# Patient Record
Sex: Female | Born: 1945
Health system: Southern US, Community
[De-identification: ages and names within clinical notes are randomized; demographics above are authoritative.]

## PROBLEM LIST (undated history)

## (undated) DIAGNOSIS — C801 Malignant (primary) neoplasm, unspecified: Secondary | ICD-10-CM

## (undated) DIAGNOSIS — R739 Hyperglycemia, unspecified: Secondary | ICD-10-CM

## (undated) DIAGNOSIS — K219 Gastro-esophageal reflux disease without esophagitis: Secondary | ICD-10-CM

## (undated) DIAGNOSIS — J449 Chronic obstructive pulmonary disease, unspecified: Secondary | ICD-10-CM

## (undated) DIAGNOSIS — E785 Hyperlipidemia, unspecified: Secondary | ICD-10-CM

## (undated) DIAGNOSIS — Z923 Personal history of irradiation: Secondary | ICD-10-CM

## (undated) DIAGNOSIS — N6310 Unspecified lump in the right breast, unspecified quadrant: Secondary | ICD-10-CM

## (undated) DIAGNOSIS — M8580 Other specified disorders of bone density and structure, unspecified site: Secondary | ICD-10-CM

## (undated) DIAGNOSIS — J189 Pneumonia, unspecified organism: Secondary | ICD-10-CM

## (undated) DIAGNOSIS — T8859XA Other complications of anesthesia, initial encounter: Secondary | ICD-10-CM

## (undated) DIAGNOSIS — E039 Hypothyroidism, unspecified: Secondary | ICD-10-CM

## (undated) DIAGNOSIS — J45909 Unspecified asthma, uncomplicated: Secondary | ICD-10-CM

## (undated) DIAGNOSIS — G43909 Migraine, unspecified, not intractable, without status migrainosus: Secondary | ICD-10-CM

## (undated) DIAGNOSIS — M1990 Unspecified osteoarthritis, unspecified site: Secondary | ICD-10-CM

## (undated) DIAGNOSIS — E079 Disorder of thyroid, unspecified: Secondary | ICD-10-CM

## (undated) DIAGNOSIS — Z8719 Personal history of other diseases of the digestive system: Secondary | ICD-10-CM

## (undated) DIAGNOSIS — N63 Unspecified lump in unspecified breast: Secondary | ICD-10-CM

## (undated) DIAGNOSIS — T4145XA Adverse effect of unspecified anesthetic, initial encounter: Secondary | ICD-10-CM

## (undated) DIAGNOSIS — I10 Essential (primary) hypertension: Secondary | ICD-10-CM

## (undated) HISTORY — DX: Chronic obstructive pulmonary disease, unspecified: J44.9

## (undated) HISTORY — DX: Essential (primary) hypertension: I10

## (undated) HISTORY — PX: TONSILLECTOMY: SUR1361

## (undated) HISTORY — PX: TUBAL LIGATION: SHX77

## (undated) HISTORY — DX: Disorder of thyroid, unspecified: E07.9

## (undated) HISTORY — DX: Unspecified osteoarthritis, unspecified site: M19.90

## (undated) HISTORY — DX: Other specified disorders of bone density and structure, unspecified site: M85.80

## (undated) HISTORY — DX: Hyperglycemia, unspecified: R73.9

## (undated) HISTORY — PX: DILATION AND CURETTAGE OF UTERUS: SHX78

## (undated) HISTORY — DX: Gastro-esophageal reflux disease without esophagitis: K21.9

## (undated) HISTORY — PX: POLYPECTOMY: SHX149

## (undated) HISTORY — PX: ANKLE SURGERY: SHX546

## (undated) HISTORY — DX: Hyperlipidemia, unspecified: E78.5

---
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
## 1977-03-31 HISTORY — PX: VAGINAL HYSTERECTOMY: SUR661

## 1998-03-31 HISTORY — PX: INGUINAL HERNIA REPAIR: SUR1180

## 2011-01-07 ENCOUNTER — Institutional Professional Consult (permissible substitution): Payer: Self-pay | Admitting: Emergency Medicine

## 2011-01-27 ENCOUNTER — Encounter: Payer: Self-pay | Admitting: Emergency Medicine

## 2011-01-28 ENCOUNTER — Ambulatory Visit (INDEPENDENT_AMBULATORY_CARE_PROVIDER_SITE_OTHER): Payer: Medicare Other | Admitting: Emergency Medicine

## 2011-01-28 ENCOUNTER — Encounter: Payer: Self-pay | Admitting: Emergency Medicine

## 2011-01-28 ENCOUNTER — Ambulatory Visit (INDEPENDENT_AMBULATORY_CARE_PROVIDER_SITE_OTHER)
Admission: RE | Admit: 2011-01-28 | Discharge: 2011-01-28 | Disposition: A | Discharge status: Home / Self Care | Payer: Medicare Other | Source: Ambulatory Visit | Attending: Emergency Medicine | Admitting: Emergency Medicine

## 2011-01-28 VITALS — BP 122/78 | HR 72 | Temp 97.1°F | Ht 64.5 in | Wt 137.8 lb

## 2011-01-28 DIAGNOSIS — J449 Chronic obstructive pulmonary disease, unspecified: Secondary | ICD-10-CM

## 2011-01-28 NOTE — Progress Notes (Signed)
Subjective:    Patient ID: Linda [name], female    DOB: Jun 16, 1945, 65 y.o.   MRN: [id number]  HPI 65 yo former smoker, dx with COPD in 2007.  Referred by Dr Sudie Bailey for evaluation of her COPD. Also w hx HTN, allergies, hyperlipidemia. Currnetly on Spiriva + prn SABA. Tells me that she had spirometry probably 2007 at Dr Donita Brooks office. Has also been rx for exac vs PNA on 2 occasions since '07. Pt has noticed more symptoms over last 8-9 mo, she finds trouble breathing at night and in the am, has been sleeping on a wedge. No real cough or sputum. She is also using albuterol more frequently - was rare, now about bid.  She is also having R chest pain, around to back, also substernal. Occurs at random, the substernal pain is a burning pain. She is able to exert, goes to the gym.     Review of Systems  Constitutional: Negative for fever and unexpected weight change.  HENT: Negative for ear pain, nosebleeds, congestion, sore throat, rhinorrhea, sneezing, trouble swallowing, dental problem, postnasal drip and sinus pressure.   Eyes: Positive for itching. Negative for redness.  Respiratory: Positive for shortness of breath and wheezing. Negative for cough and chest tightness.   Cardiovascular: Negative for palpitations and leg swelling.  Gastrointestinal: Negative for nausea and vomiting.  Genitourinary: Negative for dysuria.  Musculoskeletal: Negative for joint swelling.  Skin: Negative for rash.  Neurological: Negative for headaches.  Hematological: Bruises/bleeds easily.  Psychiatric/Behavioral: Positive for dysphoric mood. The patient is not nervous/anxious.     Past Medical History  Diagnosis Date  . Hypertension   . Thyroid disease   . Arthritis   . GERD (gastroesophageal reflux disease)   . COPD (chronic obstructive pulmonary disease)      Family History  Problem Relation Age of Onset  . Hypertension Father   . Hypertension Mother   . Stroke Mother   . Heart attack Mother      History   Social History  . Marital Status: Legally Separated    Spouse Name: N/A    Number of Children: N/A  . Years of Education: N/A   Occupational History  . Not on file.   Social History Main Topics  . Smoking status: Former Smoker -- 1.5 packs/day for 20 years  . Smokeless tobacco: Never Used  . Alcohol Use: Not on file  . Drug Use: Not on file  . Sexually Active: Not on file   Other Topics Concern  . Not on file   Social History Narrative  . No narrative on file     Allergies  Allergen Reactions  . Aspirin     ulcer     Outpatient Prescriptions Prior to Visit  Medication Sig Dispense Refill  . albuterol (PROAIR HFA) 108 (90 BASE) MCG/ACT inhaler Inhale 2 puffs into the lungs every 6 (six) hours as needed.        Marland Kitchen albuterol (PROVENTIL) (2.5 MG/3ML) 0.083% nebulizer solution Take 2.5 mg by nebulization 4 (four) times daily.        . Biotin 5000 MCG CAPS Take 5,000 capsules by mouth once. Take 1 cap three times a week       . Cholecalciferol (VITAMIN D3) 1000 UNITS CAPS Take 1,000 Units by mouth once.        . Coenzyme Q10 (COQ10) 150 MG CAPS Take by mouth once. 10/150 mg qd       . FA-B6-B12-Omega 3-Biotin-Cr (DIVISTA) 1 MG CAPS  Take 1 mg by mouth 1 day or 1 dose.        . levothyroxine (SYNTHROID, LEVOTHROID) 88 MCG tablet Take 88 mcg by mouth daily.        Marland Kitchen losartan (COZAAR) 100 MG tablet Take 100 mg by mouth daily.        . meloxicam (MOBIC) 15 MG tablet Take 15 mg by mouth daily.        Marland Kitchen omeprazole (PRILOSEC) 20 MG capsule Take 20 mg by mouth daily.        Maxwell Caul Bicarbonate (ZEGERID) 20-1100 MG CAPS Take 1 capsule by mouth.        . Pantethine 300 MG CAPS Take by mouth. As directed        . pravastatin (PRAVACHOL) 20 MG tablet Take 20 mg by mouth daily.        Marland Kitchen tiotropium (SPIRIVA) 18 MCG inhalation capsule Place 18 mcg into inhaler and inhale daily.              Objective:   Physical Exam  Gen: Pleasant, in no distress, pressured  speech  ENT: No lesions,  mouth clear,  oropharynx clear, no postnasal drip  Neck: No JVD, no TMG, no carotid bruits  Lungs: No use of accessory muscles, no dullness to percussion, clear without rales or rhonchi  Cardiovascular: RRR, heart sounds normal, no murmur or gallops, no peripheral edema  Musculoskeletal: No deformities, no cyanosis or clubbing  Neuro: alert, non focal  Skin: Warm, no lesions or rashes     Assessment & Plan:  COPD (chronic obstructive pulmonary disease) - full PFT - CXR today - spiriva qd - walking oximetry today - rov next avail

## 2011-01-28 NOTE — Assessment & Plan Note (Signed)
-   full PFT - CXR today - spiriva qd - walking oximetry today - rov next avail

## 2011-01-28 NOTE — Patient Instructions (Signed)
Continue your Spiriva Use albuterol or ProAir as needed Walking oximetry today Full Pulmonary Function testing CXR today Follow up next available with PFT's

## 2011-02-18 ENCOUNTER — Encounter: Payer: Self-pay | Admitting: Emergency Medicine

## 2011-02-18 ENCOUNTER — Ambulatory Visit (INDEPENDENT_AMBULATORY_CARE_PROVIDER_SITE_OTHER): Payer: Medicare Other | Admitting: Emergency Medicine

## 2011-02-18 VITALS — BP 120/76 | HR 92 | Temp 97.8°F | Ht 64.0 in | Wt 137.0 lb

## 2011-02-18 DIAGNOSIS — J449 Chronic obstructive pulmonary disease, unspecified: Secondary | ICD-10-CM

## 2011-02-18 LAB — PULMONARY FUNCTION TEST

## 2011-02-18 NOTE — Progress Notes (Signed)
  Subjective:    Patient ID: Linda [name], female    DOB: 1945-08-02, 65 y.o.   MRN: [id number] HPI 65 yo former smoker, dx with COPD in 2007.  Referred by Dr Sudie Bailey for evaluation of her COPD. Also w hx HTN, allergies, hyperlipidemia. Currnetly on Spiriva + prn SABA. Tells me that she had spirometry probably 2007 at Dr Donita Brooks office. Has also been rx for exac vs PNA on 2 occasions since '07. Pt has noticed more symptoms over last 8-9 mo, she finds trouble breathing at night and in the am, has been sleeping on a wedge. No real cough or sputum. She is also using albuterol more frequently - was rare, now about bid.  She is also having R chest pain, around to back, also substernal. Occurs at random, the substernal pain is a burning pain. She is able to exert, goes to the gym.    ROV 02/18/11 -- follows up after PFT. Hx COPD dx in 2007. Cleda Daub shows FEV1 1.46 (69% pred).  She may be coming down w a URI. CXR last time showed no abnormalities. She feels well.    PULMONARY FUNCTON TEST 02/18/2011  FVC 3.42  FEV1 1.46  FEV1/FVC 42.7  FVC  % Predicted 117  FEV % Predicted 69  FeF 25-75 0.46  FeF 25-75 % Predicted 2.41      Objective:   Physical Exam  Gen: Pleasant, in no distress, pressured speech  ENT: No lesions,  mouth clear,  oropharynx clear, no postnasal drip  Neck: No JVD, no TMG, no carotid bruits  Lungs: No use of accessory muscles, no dullness to percussion, clear without rales or rhonchi  Cardiovascular: RRR, heart sounds normal, no murmur or gallops, no peripheral edema  Musculoskeletal: No deformities, no cyanosis or clubbing  Neuro: alert, non focal  Skin: Warm, no lesions or rashes     Assessment & Plan:  COPD (chronic obstructive pulmonary disease) PFT show moderate to moderately severe AFL. She is stable on Spiriva, believes it probably helps her.  - same BD's - rov 6 months

## 2011-02-18 NOTE — Progress Notes (Signed)
PFT done today. 

## 2011-02-18 NOTE — Patient Instructions (Signed)
Please continue your Spiriva every day Use your ProAir as needed Follow with Dr Delton Coombes in 6 months or sooner if you have any problems.

## 2011-02-18 NOTE — Assessment & Plan Note (Signed)
PFT show moderate to moderately severe AFL. She is stable on Spiriva, believes it probably helps her.  - same BD's - rov 6 months

## 2011-04-29 ENCOUNTER — Telehealth: Payer: Self-pay | Admitting: Emergency Medicine

## 2011-04-29 NOTE — Telephone Encounter (Signed)
ATC---still fast busy signal.  Emergency contact number disconnected.

## 2011-04-29 NOTE — Telephone Encounter (Signed)
ATC the pt's home number several times- kept getting a fast busy tone Called work number listed and was informed no one by her name is employed there Called the emergency contact number and this number has been d/c'ed Will cont to try the home number and try to get the pt to see TP at [facility] clinic this pm

## 2011-04-29 NOTE — Telephone Encounter (Signed)
ATC- still fast busy tone

## 2011-04-29 NOTE — Telephone Encounter (Signed)
Attempted to call, number still busy.

## 2011-04-29 NOTE — Telephone Encounter (Signed)
ATC- still fast busy tone 

## 2011-04-30 NOTE — Telephone Encounter (Signed)
ATC the home number again, NA and no option to leave a msg. WCB

## 2011-04-30 NOTE — Telephone Encounter (Signed)
ATC again at home number. Line rang several times, NA, no voicemail. WCB. Carron Curie, CMA

## 2011-05-01 ENCOUNTER — Ambulatory Visit: Payer: Medicare Other | Admitting: Emergency Medicine

## 2011-05-01 NOTE — Telephone Encounter (Signed)
LMTCB ? Why did she cancel her appt??

## 2011-05-01 NOTE — Telephone Encounter (Signed)
Pt returned call. Says her power went out and that's why no one could reach her. She says that she feels "better" and doesn't believe she has pna. Doesn't want an appt today or "any time soon" but says she will call if she needs anything further. Did not need a call back. I am closing this msg. Hazel Sams

## 2014-08-22 DIAGNOSIS — J329 Chronic sinusitis, unspecified: Secondary | ICD-10-CM | POA: Diagnosis not present

## 2014-08-22 DIAGNOSIS — J309 Allergic rhinitis, unspecified: Secondary | ICD-10-CM | POA: Diagnosis not present

## 2014-08-22 DIAGNOSIS — E890 Postprocedural hypothyroidism: Secondary | ICD-10-CM | POA: Diagnosis not present

## 2014-08-22 DIAGNOSIS — Z79899 Other long term (current) drug therapy: Secondary | ICD-10-CM | POA: Diagnosis not present

## 2014-08-22 DIAGNOSIS — J449 Chronic obstructive pulmonary disease, unspecified: Secondary | ICD-10-CM | POA: Diagnosis not present

## 2014-08-22 DIAGNOSIS — E785 Hyperlipidemia, unspecified: Secondary | ICD-10-CM | POA: Diagnosis not present

## 2014-08-22 DIAGNOSIS — I10 Essential (primary) hypertension: Secondary | ICD-10-CM | POA: Diagnosis not present

## 2014-08-22 DIAGNOSIS — R51 Headache: Secondary | ICD-10-CM | POA: Diagnosis not present

## 2014-08-22 DIAGNOSIS — R7301 Impaired fasting glucose: Secondary | ICD-10-CM | POA: Diagnosis not present

## 2014-08-22 DIAGNOSIS — Z0001 Encounter for general adult medical examination with abnormal findings: Secondary | ICD-10-CM | POA: Diagnosis not present

## 2014-08-22 DIAGNOSIS — E559 Vitamin D deficiency, unspecified: Secondary | ICD-10-CM | POA: Diagnosis not present

## 2014-08-31 ENCOUNTER — Other Ambulatory Visit: Payer: Self-pay

## 2014-08-31 DIAGNOSIS — Z1231 Encounter for screening mammogram for malignant neoplasm of breast: Secondary | ICD-10-CM

## 2014-10-03 ENCOUNTER — Ambulatory Visit
Admission: RE | Admit: 2014-10-03 | Discharge: 2014-10-03 | Disposition: A | Discharge status: Home / Self Care | Payer: Commercial Managed Care - HMO | Source: Ambulatory Visit

## 2014-10-03 DIAGNOSIS — Z1231 Encounter for screening mammogram for malignant neoplasm of breast: Secondary | ICD-10-CM

## 2014-10-04 ENCOUNTER — Other Ambulatory Visit: Payer: Self-pay | Admitting: Internal Medicine

## 2014-10-04 DIAGNOSIS — R928 Other abnormal and inconclusive findings on diagnostic imaging of breast: Secondary | ICD-10-CM

## 2014-10-19 DIAGNOSIS — E785 Hyperlipidemia, unspecified: Secondary | ICD-10-CM | POA: Diagnosis not present

## 2014-10-19 DIAGNOSIS — H109 Unspecified conjunctivitis: Secondary | ICD-10-CM | POA: Diagnosis not present

## 2014-10-19 DIAGNOSIS — E890 Postprocedural hypothyroidism: Secondary | ICD-10-CM | POA: Diagnosis not present

## 2014-10-19 DIAGNOSIS — I10 Essential (primary) hypertension: Secondary | ICD-10-CM | POA: Diagnosis not present

## 2014-10-19 DIAGNOSIS — R7301 Impaired fasting glucose: Secondary | ICD-10-CM | POA: Diagnosis not present

## 2014-10-19 DIAGNOSIS — J449 Chronic obstructive pulmonary disease, unspecified: Secondary | ICD-10-CM | POA: Diagnosis not present

## 2014-12-21 DIAGNOSIS — E785 Hyperlipidemia, unspecified: Secondary | ICD-10-CM | POA: Diagnosis not present

## 2014-12-21 DIAGNOSIS — I10 Essential (primary) hypertension: Secondary | ICD-10-CM | POA: Diagnosis not present

## 2014-12-21 DIAGNOSIS — J449 Chronic obstructive pulmonary disease, unspecified: Secondary | ICD-10-CM | POA: Diagnosis not present

## 2014-12-21 DIAGNOSIS — E890 Postprocedural hypothyroidism: Secondary | ICD-10-CM | POA: Diagnosis not present

## 2014-12-21 DIAGNOSIS — R7301 Impaired fasting glucose: Secondary | ICD-10-CM | POA: Diagnosis not present

## 2014-12-28 DIAGNOSIS — Z1211 Encounter for screening for malignant neoplasm of colon: Secondary | ICD-10-CM | POA: Diagnosis not present

## 2015-02-06 DIAGNOSIS — I10 Essential (primary) hypertension: Secondary | ICD-10-CM | POA: Diagnosis not present

## 2015-02-06 DIAGNOSIS — R0789 Other chest pain: Secondary | ICD-10-CM | POA: Diagnosis not present

## 2015-02-06 DIAGNOSIS — R0602 Shortness of breath: Secondary | ICD-10-CM | POA: Diagnosis not present

## 2015-02-06 DIAGNOSIS — J441 Chronic obstructive pulmonary disease with (acute) exacerbation: Secondary | ICD-10-CM | POA: Diagnosis not present

## 2015-02-20 DIAGNOSIS — R0602 Shortness of breath: Secondary | ICD-10-CM | POA: Diagnosis not present

## 2015-02-20 DIAGNOSIS — J441 Chronic obstructive pulmonary disease with (acute) exacerbation: Secondary | ICD-10-CM | POA: Diagnosis not present

## 2015-02-20 DIAGNOSIS — J40 Bronchitis, not specified as acute or chronic: Secondary | ICD-10-CM | POA: Diagnosis not present

## 2015-02-20 DIAGNOSIS — R079 Chest pain, unspecified: Secondary | ICD-10-CM | POA: Diagnosis not present

## 2015-02-26 DIAGNOSIS — R079 Chest pain, unspecified: Secondary | ICD-10-CM | POA: Diagnosis not present

## 2015-02-26 DIAGNOSIS — R0602 Shortness of breath: Secondary | ICD-10-CM | POA: Diagnosis not present

## 2015-02-26 DIAGNOSIS — E278 Other specified disorders of adrenal gland: Secondary | ICD-10-CM | POA: Diagnosis not present

## 2015-02-26 DIAGNOSIS — J439 Emphysema, unspecified: Secondary | ICD-10-CM | POA: Diagnosis not present

## 2015-02-26 DIAGNOSIS — J9811 Atelectasis: Secondary | ICD-10-CM | POA: Diagnosis not present

## 2015-03-08 DIAGNOSIS — J449 Chronic obstructive pulmonary disease, unspecified: Secondary | ICD-10-CM | POA: Diagnosis not present

## 2015-03-08 DIAGNOSIS — R079 Chest pain, unspecified: Secondary | ICD-10-CM | POA: Diagnosis not present

## 2015-03-08 DIAGNOSIS — B370 Candidal stomatitis: Secondary | ICD-10-CM | POA: Diagnosis not present

## 2015-04-24 DIAGNOSIS — E890 Postprocedural hypothyroidism: Secondary | ICD-10-CM | POA: Diagnosis not present

## 2015-04-24 DIAGNOSIS — I10 Essential (primary) hypertension: Secondary | ICD-10-CM | POA: Diagnosis not present

## 2015-04-24 DIAGNOSIS — E785 Hyperlipidemia, unspecified: Secondary | ICD-10-CM | POA: Diagnosis not present

## 2015-04-24 DIAGNOSIS — R7301 Impaired fasting glucose: Secondary | ICD-10-CM | POA: Diagnosis not present

## 2015-04-24 DIAGNOSIS — J449 Chronic obstructive pulmonary disease, unspecified: Secondary | ICD-10-CM | POA: Diagnosis not present

## 2015-05-11 DIAGNOSIS — E890 Postprocedural hypothyroidism: Secondary | ICD-10-CM | POA: Diagnosis not present

## 2015-05-11 DIAGNOSIS — R7301 Impaired fasting glucose: Secondary | ICD-10-CM | POA: Diagnosis not present

## 2015-05-11 DIAGNOSIS — K210 Gastro-esophageal reflux disease with esophagitis: Secondary | ICD-10-CM | POA: Diagnosis not present

## 2015-05-11 DIAGNOSIS — Z0001 Encounter for general adult medical examination with abnormal findings: Secondary | ICD-10-CM | POA: Diagnosis not present

## 2015-05-11 DIAGNOSIS — Z79899 Other long term (current) drug therapy: Secondary | ICD-10-CM | POA: Diagnosis not present

## 2015-05-11 DIAGNOSIS — E559 Vitamin D deficiency, unspecified: Secondary | ICD-10-CM | POA: Diagnosis not present

## 2015-05-11 DIAGNOSIS — J441 Chronic obstructive pulmonary disease with (acute) exacerbation: Secondary | ICD-10-CM | POA: Diagnosis not present

## 2015-05-11 DIAGNOSIS — E785 Hyperlipidemia, unspecified: Secondary | ICD-10-CM | POA: Diagnosis not present

## 2015-05-11 DIAGNOSIS — I10 Essential (primary) hypertension: Secondary | ICD-10-CM | POA: Diagnosis not present

## 2015-05-29 ENCOUNTER — Other Ambulatory Visit: Payer: Self-pay | Admitting: Internal Medicine

## 2015-05-29 DIAGNOSIS — K2100 Gastro-esophageal reflux disease with esophagitis, without bleeding: Secondary | ICD-10-CM

## 2015-06-18 DIAGNOSIS — Z85828 Personal history of other malignant neoplasm of skin: Secondary | ICD-10-CM | POA: Diagnosis not present

## 2015-06-18 DIAGNOSIS — C44519 Basal cell carcinoma of skin of other part of trunk: Secondary | ICD-10-CM | POA: Diagnosis not present

## 2015-06-26 ENCOUNTER — Ambulatory Visit
Admission: RE | Admit: 2015-06-26 | Discharge: 2015-06-26 | Disposition: A | Discharge status: Home / Self Care | Payer: Commercial Managed Care - HMO | Source: Ambulatory Visit | Attending: Internal Medicine | Admitting: Internal Medicine

## 2015-06-26 DIAGNOSIS — K2100 Gastro-esophageal reflux disease with esophagitis, without bleeding: Secondary | ICD-10-CM

## 2015-06-26 DIAGNOSIS — K219 Gastro-esophageal reflux disease without esophagitis: Secondary | ICD-10-CM | POA: Diagnosis not present

## 2015-06-26 DIAGNOSIS — K449 Diaphragmatic hernia without obstruction or gangrene: Secondary | ICD-10-CM | POA: Diagnosis not present

## 2015-07-04 ENCOUNTER — Encounter: Payer: Self-pay | Admitting: Pulmonary Disease

## 2015-07-04 ENCOUNTER — Encounter: Payer: Self-pay | Admitting: *Deleted

## 2015-07-05 ENCOUNTER — Encounter: Payer: Self-pay | Admitting: Pulmonary Disease

## 2015-07-05 ENCOUNTER — Ambulatory Visit (INDEPENDENT_AMBULATORY_CARE_PROVIDER_SITE_OTHER): Payer: Commercial Managed Care - HMO | Admitting: Pulmonary Disease

## 2015-07-05 VITALS — BP 156/82 | HR 80 | Ht 64.0 in | Wt 137.0 lb

## 2015-07-05 DIAGNOSIS — J439 Emphysema, unspecified: Secondary | ICD-10-CM | POA: Diagnosis not present

## 2015-07-05 NOTE — Progress Notes (Signed)
Subjective:    Patient ID: Linda [name], female    DOB: [birth year], 70 y.o.   MRN: [id number]  HPI Consult for management of COPD.  Linda [name] is a 70 year old with moderate COPD. She was seen by Dr. Lamonte Sakai in 2012. She has not followed up in our office since then. She has been maintained on Spiriva and Symbicort. His symptoms have been stable until this year when she has several exacerbations over the winter requiring courses of antibiotics and steroids. Her primary care physician has referred her back to [place] for further management.  Her chief complaint is dyspnea on exertion, loss of energy, fatigue,  chronic cough with sputum production. She usually exercises frequently at the gym but has not had the energy to do so in the past 3 months.  Data: PFTs 02/18/51 FVC 2.42 [117%) FEV1 1.46 (69%) F/F 46 TLC 112% DLCO 69% Moderate obstructive defect, mild reduction in diffusion capacity.  Social History: She is to refer-pack-year smoking history. Quit in 2008. She drinks alcohol once or twice a year. No illegal drug use.  Family history: Mother-allergies, coronary artery disease, hypertension, stroke. Father-hypertension  Past Medical History  Diagnosis Date  . Hypertension   . Thyroid disease   . Arthritis   . GERD (gastroesophageal reflux disease)   . COPD (chronic obstructive pulmonary disease) ([place])   . Hyperlipidemia   . Hyperglycemia   . Osteopenia     Current outpatient prescriptions:  .  albuterol (PROAIR HFA) 108 (90 BASE) MCG/ACT inhaler, Inhale 2 puffs into the lungs every 6 (six) hours as needed.  , Disp: , Rfl:  .  albuterol (PROVENTIL) (2.5 MG/3ML) 0.083% nebulizer solution, Take 2.5 mg by nebulization 4 (four) times daily.  , Disp: , Rfl:  .  amLODipine (NORVASC) 5 MG tablet, Take 5 mg by mouth daily., Disp: , Rfl:  .  budesonide-formoterol (SYMBICORT) 160-4.5 MCG/ACT inhaler, Inhale 2 puffs into the lungs 2 (two) times daily., Disp: , Rfl:  .  Cholecalciferol  (VITAMIN D3) 1000 UNITS CAPS, Take 1,000 Units by mouth once.  , Disp: , Rfl:  .  dexlansoprazole (DEXILANT) 60 MG capsule, Take 60 mg by mouth daily., Disp: , Rfl:  .  levothyroxine (SYNTHROID, LEVOTHROID) 88 MCG tablet, Take 88 mcg by mouth daily.  , Disp: , Rfl:  .  losartan (COZAAR) 100 MG tablet, Take 100 mg by mouth daily.  , Disp: , Rfl:  .  Magnesium Chloride (MAGNESIUM DR PO), Take by mouth daily., Disp: , Rfl:  .  meloxicam (MOBIC) 15 MG tablet, Take 15 mg by mouth daily.  , Disp: , Rfl:  .  omeprazole (PRILOSEC) 20 MG capsule, Take 20 mg by mouth daily.  , Disp: , Rfl:  .  Omeprazole-Sodium Bicarbonate (ZEGERID) 20-1100 MG CAPS, Take 1 capsule by mouth.  , Disp: , Rfl:  .  OVER THE COUNTER MEDICATION, Tru Biotics, Enzyme Defense, Natural Calm, Disp: , Rfl:  .  pravastatin (PRAVACHOL) 20 MG tablet, Take 20 mg by mouth daily.  , Disp: , Rfl:  .  tiotropium (SPIRIVA) 18 MCG inhalation capsule, Place 18 mcg into inhaler and inhale daily.  , Disp: , Rfl:   Review of Systems Dyspnea on exertion cough, sputum production, wheezing. No hemoptysis. No fevers, chills, loss of weight, loss of appetite. Positive for malaise, fatigue, loss of energy. No chest pain, palpitation. No nausea, vomiting, diarrhea, constipation. All other review of systems are negative    Objective:   Physical Exam  Blood pressure 156/82, pulse 80, height 5\' 4"  (1.626 m), weight 137 lb (62.143 kg), SpO2 95 % at rest, 92% on exertion. Gen: No apparent distress Neuro: No gross focal deficits. HEENT: No JVD, lymphadenopathy, thyromegaly. RS: Clear, No wheeze or crackles CVS: S1-S2 heard, no murmurs rubs gallops. Abdomen: Soft, positive bowel sounds. Musculoskeletal: No edema.    Assessment & Plan:  Moderate COPD. GOLD stage II She's been maintained previously on Symbicort and Spiriva. However she's noted a decline in function over the past year with frequent exacerbations requiring antibiotic and steroids. I'll  repeat pulmonary function tests for a more recent assessment of lung function. I'll start her on daliresp to reduce the number of exacerbations. I discussed pulmonary rehabilitation with her but she is trying to resume her exercise program with a personal trainer and feels she does not need another program. She did not desaturate on exercise today.  Plan: - PFTs,  - Continue symbicort and spiriva - Start daliresp.  Marshell Garfinkel MD [facility] Pager [phone] If no answer or after 3pm call: [phone] 07/05/2015, 4:04 PM

## 2015-07-05 NOTE — Patient Instructions (Signed)
We will schedule you for lung function tests and start you on daliresp.  Return to clinic in 3 months

## 2015-07-09 ENCOUNTER — Telehealth: Payer: Self-pay | Admitting: Pulmonary Disease

## 2015-07-09 MED ORDER — ROFLUMILAST 500 MCG PO TABS: 500.0000 ug | ORAL_TABLET | Freq: Every day | ORAL | Status: DC

## 2015-07-09 NOTE — Telephone Encounter (Signed)
Spoke with pt. When she was here for her last OV, PM wanted her to start Holiday. This was not sent to the pharmacy. This has been done. Nothing further was needed.

## 2015-08-22 ENCOUNTER — Other Ambulatory Visit: Payer: Self-pay | Admitting: Pulmonary Disease

## 2015-08-22 DIAGNOSIS — J449 Chronic obstructive pulmonary disease, unspecified: Secondary | ICD-10-CM

## 2015-08-23 ENCOUNTER — Ambulatory Visit (INDEPENDENT_AMBULATORY_CARE_PROVIDER_SITE_OTHER): Payer: Commercial Managed Care - HMO | Admitting: Pulmonary Disease

## 2015-08-23 DIAGNOSIS — J449 Chronic obstructive pulmonary disease, unspecified: Secondary | ICD-10-CM

## 2015-08-23 LAB — PULMONARY FUNCTION TEST
DL/VA % PRED: 60 %
DL/VA: 2.89 ml/min/mmHg/L
DLCO COR % PRED: 58 %
DLCO COR: 14.14 ml/min/mmHg
DLCO UNC % PRED: 57 %
DLCO unc: 14 ml/min/mmHg
FEF 25-75 POST: 0.49 L/s
FEF 25-75 PRE: 0.34 L/s
FEF2575-%CHANGE-POST: 44 %
FEF2575-%PRED-PRE: 18 %
FEF2575-%Pred-Post: 26 %
FEV1-%Change-Post: 18 %
FEV1-%PRED-PRE: 41 %
FEV1-%Pred-Post: 49 %
FEV1-POST: 1.11 L
FEV1-Pre: 0.94 L
FEV1FVC-%Change-Post: 3 %
FEV1FVC-%Pred-Pre: 58 %
FEV6-%CHANGE-POST: 16 %
FEV6-%PRED-PRE: 70 %
FEV6-%Pred-Post: 81 %
FEV6-PRE: 2 L
FEV6-Post: 2.32 L
FEV6FVC-%Change-Post: 2 %
FEV6FVC-%Pred-Post: 99 %
FEV6FVC-%Pred-Pre: 97 %
FVC-%Change-Post: 14 %
FVC-%PRED-POST: 82 %
FVC-%PRED-PRE: 71 %
FVC-POST: 2.44 L
FVC-PRE: 2.14 L
POST FEV1/FVC RATIO: 46 %
POST FEV6/FVC RATIO: 95 %
PRE FEV1/FVC RATIO: 44 %
Pre FEV6/FVC Ratio: 93 %
RV % pred: 136 %
RV: 3.01 L
TLC % pred: 114 %
TLC: 5.77 L

## 2015-08-23 NOTE — Progress Notes (Signed)
PFT done today. 

## 2015-09-11 ENCOUNTER — Telehealth: Payer: Self-pay | Admitting: Pulmonary Disease

## 2015-09-11 NOTE — Telephone Encounter (Signed)
Had called patient to discuss PFT results as stated by PM: Notes Recorded by Rinaldo Ratel, CMA on 09/11/2015 at 11:51 AM Called spoke with patient, advised of PFT results / recs as stated by PM.  Pt verbalized her understanding and denied any questions.  Notes Recorded by Marshell Garfinkel, MD on 09/10/2015 at 1:54 PM Please let the patient know that her PFTs shows that she has worsening COPD. I will assess her response to daliresp at next visit. No changes to her med regimen need now.  Pt asking if PM can stage her COPD, please She would also like PM to be aware that she is unable to afford both her Spiriva and Symbicort every month (due to the donut hole) and typically fills the Symbicort because she does not feel like the Spiriva is working for her anymore but the Symbicort does not "last a long time"  Please advise, thank you.

## 2015-09-11 NOTE — Progress Notes (Signed)
Quick Note:  Called spoke with patient, advised of PFT results / recs as stated by PM. Pt verbalized her understanding and denied any questions. ______

## 2015-09-12 NOTE — Telephone Encounter (Signed)
We can try her on Airduo 232/14 mcg, the generic form which is cheaper. She can be given a diagnosis of asthma and COPD, as there is a bronchodilator response on PFTs. Its FDA approved only for asthma.  Use this instead of the symbicort. Give her samples of spiriva if we have any Thanks

## 2015-09-14 NOTE — Telephone Encounter (Signed)
Called spoke with patient and discussed PM's recs.  Pt stated that she would like to stay with the Symbicort for now but will discuss with PM at her upcoming 7.11.17 appt.  We have no samples of the Airduo for her to try, nor any Spiriva HandiHaler samples.  Nothing further needed at this time; will sign off.

## 2015-10-09 ENCOUNTER — Encounter: Payer: Self-pay | Admitting: Pulmonary Disease

## 2015-10-09 ENCOUNTER — Ambulatory Visit (INDEPENDENT_AMBULATORY_CARE_PROVIDER_SITE_OTHER): Payer: Commercial Managed Care - HMO | Admitting: Pulmonary Disease

## 2015-10-09 VITALS — BP 150/92 | HR 85 | Temp 98.2°F | Ht 64.5 in | Wt 131.8 lb

## 2015-10-09 DIAGNOSIS — J449 Chronic obstructive pulmonary disease, unspecified: Secondary | ICD-10-CM

## 2015-10-09 NOTE — Progress Notes (Signed)
Subjective:    Patient ID: Linda [name], female    DOB: [birth year], 70 y.o.   MRN: [id number]  PROBLEM LIST Severe COPD  HPI Linda [name] is a 70 year old with moderate COPD. She was seen by Dr. Lamonte Sakai in 2012. She has not followed up in our office since then. She has been maintained on Spiriva and Symbicort. His symptoms have been stable until this year when she has several exacerbations over the winter requiring courses of antibiotics and steroids. Her primary care physician has referred her back to [place] for further management. Her chief complaint is dyspnea on exertion, loss of energy, fatigue,  chronic cough with sputum production.   Data: PFTs  02/18/11 FVC 2.42 [117%) FEV1 1.46 (69%) F/F 46 TLC 112% DLCO 69% Moderate obstructive defect, mild reduction in diffusion capacity.  08/23/15 FVC 2.14 [71%] FEV1 0.94 [41%] F/F 44 TLC 114% DLCO 57% Severe obstructive defect with moderate reduction in diffusion capacity  Social History: She is to refer-pack-year smoking history. Quit in 2008. She drinks alcohol once or twice a year. No illegal drug use.  Family history: Mother-allergies, coronary artery disease, hypertension, stroke. Father-hypertension  Past Medical History  Diagnosis Date  . Hypertension   . Thyroid disease   . Arthritis   . GERD (gastroesophageal reflux disease)   . COPD (chronic obstructive pulmonary disease) ([place])   . Hyperlipidemia   . Hyperglycemia   . Osteopenia     Current outpatient prescriptions:  .  albuterol (PROAIR HFA) 108 (90 BASE) MCG/ACT inhaler, Inhale 2 puffs into the lungs every 6 (six) hours as needed.  , Disp: , Rfl:  .  albuterol (PROVENTIL) (2.5 MG/3ML) 0.083% nebulizer solution, Take 2.5 mg by nebulization 4 (four) times daily.  , Disp: , Rfl:  .  amLODipine (NORVASC) 5 MG tablet, Take 5 mg by mouth daily., Disp: , Rfl:  .  budesonide-formoterol (SYMBICORT) 160-4.5 MCG/ACT inhaler, Inhale 2 puffs into the lungs 2 (two) times  daily., Disp: , Rfl:  .  Cholecalciferol (VITAMIN D3) 1000 UNITS CAPS, Take 1,000 Units by mouth once.  , Disp: , Rfl:  .  dexlansoprazole (DEXILANT) 60 MG capsule, Take 60 mg by mouth daily., Disp: , Rfl:  .  levothyroxine (SYNTHROID, LEVOTHROID) 88 MCG tablet, Take 88 mcg by mouth daily.  , Disp: , Rfl:  .  losartan (COZAAR) 100 MG tablet, Take 100 mg by mouth daily.  , Disp: , Rfl:  .  Magnesium Chloride (MAGNESIUM DR PO), Take by mouth daily., Disp: , Rfl:  .  meloxicam (MOBIC) 15 MG tablet, Take 15 mg by mouth daily.  , Disp: , Rfl:  .  omeprazole (PRILOSEC) 20 MG capsule, Take 20 mg by mouth daily.  , Disp: , Rfl:  .  Omeprazole-Sodium Bicarbonate (ZEGERID) 20-1100 MG CAPS, Take 1 capsule by mouth.  , Disp: , Rfl:  .  OVER THE COUNTER MEDICATION, Tru Biotics, Enzyme Defense, Natural Calm, Disp: , Rfl:  .  pravastatin (PRAVACHOL) 20 MG tablet, Take 20 mg by mouth daily.  , Disp: , Rfl:  .  roflumilast (DALIRESP) 500 MCG TABS tablet, Take 1 tablet (500 mcg total) by mouth daily., Disp: 30 tablet, Rfl: 5 .  tiotropium (SPIRIVA) 18 MCG inhalation capsule, Place 18 mcg into inhaler and inhale daily.  , Disp: , Rfl:   Review of Systems Dyspnea on exertion cough, sputum production, wheezing. No hemoptysis. No fevers, chills, loss of weight, loss of appetite. Positive for malaise, fatigue, loss of energy.  No chest pain, palpitation. No nausea, vomiting, diarrhea, constipation. All other review of systems are negative    Objective:   Physical Exam Blood pressure 150/92, pulse 85, temperature 98.2 F (36.8 C), temperature source Oral, height 5' 4.5" (1.638 m), weight 131 lb 12.8 oz (59.784 kg), SpO2 97 %. Gen: No apparent distress Neuro: No gross focal deficits. HEENT: No JVD, lymphadenopathy, thyromegaly. RS: Clear, No wheeze or crackles CVS: S1-S2 heard, no murmurs rubs gallops. Abdomen: Soft, positive bowel sounds. Musculoskeletal: No edema.    Assessment & Plan:  Severe COPD. GOLD  stage III She is maintained on symbicort and spiriva. She was started on daliresp at last visit and she is doing well on it with no exacerbations. I discussed pulmonary rehabilitation with her but she is already on a exercise program with a personal trainer 3 times/week and feels she does not need another program.  I reviewed the PFTs with her today.  Plan: - Continue daliresp, symbicort and spiriva  Marshell Garfinkel MD [facility] Pager [phone] If no answer or after 3pm call: [phone] 10/09/2015, 1:50 PM

## 2015-10-09 NOTE — Patient Instructions (Addendum)
Continue daliresp, symbicort and spiriva. We will see if there are samples of the inhalers to give you.  Return on 6 months

## 2015-12-30 ENCOUNTER — Other Ambulatory Visit: Payer: Self-pay | Admitting: Pulmonary Disease

## 2016-01-24 ENCOUNTER — Encounter: Payer: Self-pay | Admitting: Pulmonary Disease

## 2016-01-24 ENCOUNTER — Ambulatory Visit (INDEPENDENT_AMBULATORY_CARE_PROVIDER_SITE_OTHER): Payer: Commercial Managed Care - HMO | Admitting: Pulmonary Disease

## 2016-01-24 VITALS — BP 130/82 | HR 85 | Ht 64.0 in | Wt 127.8 lb

## 2016-01-24 DIAGNOSIS — J449 Chronic obstructive pulmonary disease, unspecified: Secondary | ICD-10-CM | POA: Diagnosis not present

## 2016-01-24 MED ORDER — BUDESONIDE-FORMOTEROL FUMARATE 160-4.5 MCG/ACT IN AERO: 2.0000 | INHALATION_SPRAY | Freq: Two times a day (BID) | RESPIRATORY_TRACT | 0 refills | Status: DC

## 2016-01-24 NOTE — Progress Notes (Signed)
Linda [name]    [id number]    September 18, 1945  Primary Care Physician:PROCHNAU,CAROLINE, MD  Referring Physician: Ernestene Kiel, MD [address]. [place], [place] [address]  Chief complaint:   Follow up for  Severe COPD  HPI: Linda [name] is a 70 year old with moderate COPD. She was seen by Dr. Lamonte Sakai in 2012. She has not followed up in our office since then. She has been maintained on Spiriva and Symbicort. His symptoms have been stable until this year when she has several exacerbations over the winter requiring courses of antibiotics and steroids. Her primary care physician has referred her back to [place] for further management. Her chief complaint is dyspnea on exertion, loss of energy, fatigue,  chronic cough with sputum production.   Interim History: For a few days last week she wasn't feeling well with exertion, increased cough, dyspnea. She feels this is brought on by stress and overworked. She was able to rest over the weekend and feels much better now. She has dyspnea on exertion at baseline. She denies any cough, sputum production, fevers, chills  Outpatient Encounter Prescriptions as of 01/24/2016  Medication Sig  . albuterol (PROAIR HFA) 108 (90 BASE) MCG/ACT inhaler Inhale 2 puffs into the lungs every 6 (six) hours as needed.    Marland Kitchen albuterol (PROVENTIL) (2.5 MG/3ML) 0.083% nebulizer solution Take 2.5 mg by nebulization 4 (four) times daily.    Marland Kitchen amLODipine (NORVASC) 5 MG tablet Take 5 mg by mouth daily.  . budesonide-formoterol (SYMBICORT) 160-4.5 MCG/ACT inhaler Inhale 2 puffs into the lungs 2 (two) times daily.  Marland Kitchen DALIRESP 500 MCG TABS tablet TAKE ONE TABLET BY MOUTH ONCE DAILY  . levothyroxine (SYNTHROID, LEVOTHROID) 88 MCG tablet Take 88 mcg by mouth daily.    Marland Kitchen losartan (COZAAR) 100 MG tablet Take 100 mg by mouth daily.    . Magnesium Chloride (MAGNESIUM DR PO) Take by mouth daily.  . meloxicam (MOBIC) 15 MG tablet Take 15 mg by mouth daily.    Marland Kitchen omeprazole  (PRILOSEC) 20 MG capsule Take 20 mg by mouth daily.    Earney Navy Bicarbonate (ZEGERID) 20-1100 MG CAPS Take 1 capsule by mouth.    Marland Kitchen OVER THE COUNTER MEDICATION Tru Biotics, Enzyme Defense, Natural Calm  . pravastatin (PRAVACHOL) 20 MG tablet Take 20 mg by mouth daily.    Marland Kitchen tiotropium (SPIRIVA) 18 MCG inhalation capsule Place 18 mcg into inhaler and inhale daily.    . Cholecalciferol (VITAMIN D3) 1000 UNITS CAPS Take 1,000 Units by mouth once.    Marland Kitchen dexlansoprazole (DEXILANT) 60 MG capsule Take 60 mg by mouth daily.   No facility-administered encounter medications on file as of 01/24/2016.     Allergies as of 01/24/2016 - Review Complete 01/24/2016  Allergen Reaction Noted  . Aspirin  01/27/2011    Past Medical History:  Diagnosis Date  . Arthritis   . COPD (chronic obstructive pulmonary disease) ([place])   . GERD (gastroesophageal reflux disease)   . Hyperglycemia   . Hyperlipidemia   . Hypertension   . Osteopenia   . Thyroid disease     Past Surgical History:  Procedure Laterality Date  . APPENDECTOMY  1963  . HERNIA REPAIR  2000  . right ankle surgery Right    X 2   . THYROIDECTOMY    . TOTAL ABDOMINAL HYSTERECTOMY  1979    Family History  Problem Relation Age of Onset  . Hypertension Father   . Hypertension Mother   .  Stroke Mother   . Heart attack Mother   . Allergies Mother     Social History   Social History  . Marital status: Legally Separated    Spouse name: N/A  . Number of children: 3  . Years of education: N/A   Occupational History  . retired    Social History Main Topics  . Smoking status: Former Smoker    Packs/day: 1.00    Years: 25.00    Types: Cigarettes    Quit date: 03/31/2006  . Smokeless tobacco: Never Used  . Alcohol use 0.0 oz/week     Comment: rare  . Drug use: No  . Sexual activity: Not on file   Other Topics Concern  . Not on file   Social History Narrative  . No narrative on file    Review of systems: Review  of Systems  Constitutional: Negative for fever and chills.  HENT: Negative.   Eyes: Negative for blurred vision.  Respiratory: as per HPI  Cardiovascular: Negative for chest pain and palpitations.  Gastrointestinal: Negative for vomiting, diarrhea, blood per rectum. Genitourinary: Negative for dysuria, urgency, frequency and hematuria.  Musculoskeletal: Negative for myalgias, back pain and joint pain.  Skin: Negative for itching and rash.  Neurological: Negative for dizziness, tremors, focal weakness, seizures and loss of consciousness.  Endo/Heme/Allergies: Negative for environmental allergies.  Psychiatric/Behavioral: Negative for depression, suicidal ideas and hallucinations.  All other systems reviewed and are negative.   Physical Exam: Blood pressure 130/82, pulse 85, height 5\' 4"  (1.626 m), weight 127 lb 12.8 oz (58 kg), SpO2 98 %. Gen:      No acute distress HEENT:  EOMI, sclera anicteric Neck:     No masses; no thyromegaly Lungs:    Clear to auscultation bilaterally; normal respiratory effort CV:         Regular rate and rhythm; no murmurs Abd:      + bowel sounds; soft, non-tender; no palpable masses, no distension Ext:    No edema; adequate peripheral perfusion Skin:      Warm and dry; no rash Neuro: alert and oriented x 3 Psych: normal mood and affect  Data Reviewed: PFTs  02/18/11 FVC 2.42 [117%) FEV1 1.46 (69%) F/F 46 TLC 112% DLCO 69% Moderate obstructive defect, mild reduction in diffusion capacity.  08/23/15 FVC 2.14 [71%] FEV1 0.94 [41%] F/F 44 TLC 114% DLCO 57% Severe obstructive defect with moderate reduction in diffusion capacity  Assessment:  Severe COPD. GOLD stage III She is maintained on symbicort and spiriva. She feels that the Daliresp has helped a lot with his symptoms. She does have some symptoms of insomnia and palpitations after starting the Daliresp. We discussed stopping it but she wants to continue on it since it is helping her. I  discussed pulmonary rehabilitation with her but she is already on a exercise program with a personal trainer 3 times/week and feels she does not need another program.   Plan/Recommendations: - Continue daliresp, symbicort and spiriva  Marshell Garfinkel MD [facility] Pager [phone] 01/24/2016, 1:54 PM  CC: Ernestene Kiel, MD

## 2016-01-24 NOTE — Patient Instructions (Signed)
Continue using inhalers and Daliresp.  Return to clinic in 3 months.

## 2016-03-31 DIAGNOSIS — Z923 Personal history of irradiation: Secondary | ICD-10-CM

## 2016-03-31 HISTORY — DX: Personal history of irradiation: Z92.3

## 2016-04-14 DIAGNOSIS — N6310 Unspecified lump in the right breast, unspecified quadrant: Secondary | ICD-10-CM | POA: Diagnosis not present

## 2016-04-14 DIAGNOSIS — I10 Essential (primary) hypertension: Secondary | ICD-10-CM | POA: Diagnosis not present

## 2016-04-14 DIAGNOSIS — E890 Postprocedural hypothyroidism: Secondary | ICD-10-CM | POA: Diagnosis not present

## 2016-04-14 DIAGNOSIS — R7301 Impaired fasting glucose: Secondary | ICD-10-CM | POA: Diagnosis not present

## 2016-04-14 DIAGNOSIS — J449 Chronic obstructive pulmonary disease, unspecified: Secondary | ICD-10-CM | POA: Diagnosis not present

## 2016-04-14 DIAGNOSIS — Z79899 Other long term (current) drug therapy: Secondary | ICD-10-CM | POA: Diagnosis not present

## 2016-04-14 DIAGNOSIS — E785 Hyperlipidemia, unspecified: Secondary | ICD-10-CM | POA: Diagnosis not present

## 2016-04-14 DIAGNOSIS — E559 Vitamin D deficiency, unspecified: Secondary | ICD-10-CM | POA: Diagnosis not present

## 2016-04-29 ENCOUNTER — Encounter: Payer: Self-pay | Admitting: Pulmonary Disease

## 2016-04-29 ENCOUNTER — Ambulatory Visit (INDEPENDENT_AMBULATORY_CARE_PROVIDER_SITE_OTHER): Payer: PPO | Admitting: Pulmonary Disease

## 2016-04-29 VITALS — BP 132/70 | HR 89 | Ht 64.0 in

## 2016-04-29 DIAGNOSIS — J449 Chronic obstructive pulmonary disease, unspecified: Secondary | ICD-10-CM

## 2016-04-29 NOTE — Progress Notes (Signed)
TOMOE ENGEL    [id number]    June 14, 1945  Primary Care Physician:PROCHNAU,CAROLINE, MD  Referring Physician: Ernestene Kiel, MD [address]. [place], [place] [address]  Chief complaint:   Follow up for  COPD GOLD D  HPI: Mrs. Spallone is a 71 year old with COPD (GOLD B, CAT score 25, multiple exacerbation over past year, FEV1 41%). She was seen by Dr. Lamonte Sakai in 2012. She has not followed up in our office since then. She has been maintained on Spiriva and Symbicort. His symptoms have been stable until this year when she has several exacerbations over the winter requiring courses of antibiotics and steroids. Her primary care physician has referred her back to [place] for further management. Her chief complaint is dyspnea on exertion, loss of energy, fatigue,  chronic cough with sputum production.   Interim History: She has dyspnea on exertion at baseline. She continues to use the Symbicort and Spiriva. She is also on Daliresp. She feels that the co-pay is high but this is helping with her symptoms.  Outpatient Encounter Prescriptions as of 04/29/2016  Medication Sig  . albuterol (PROAIR HFA) 108 (90 BASE) MCG/ACT inhaler Inhale 2 puffs into the lungs every 6 (six) hours as needed.    Marland Kitchen albuterol (PROVENTIL) (2.5 MG/3ML) 0.083% nebulizer solution Take 2.5 mg by nebulization 4 (four) times daily.    Marland Kitchen amLODipine (NORVASC) 5 MG tablet Take 5 mg by mouth daily.  . budesonide-formoterol (SYMBICORT) 160-4.5 MCG/ACT inhaler Inhale 2 puffs into the lungs 2 (two) times daily.  . Cholecalciferol (VITAMIN D3) 1000 UNITS CAPS Take 1,000 Units by mouth once.    Marland Kitchen DALIRESP 500 MCG TABS tablet TAKE ONE TABLET BY MOUTH ONCE DAILY  . levothyroxine (SYNTHROID, LEVOTHROID) 88 MCG tablet Take 88 mcg by mouth daily.    Marland Kitchen losartan (COZAAR) 100 MG tablet Take 100 mg by mouth daily.    . Magnesium Chloride (MAGNESIUM DR PO) Take by mouth daily.  . meloxicam (MOBIC) 15 MG tablet Take 15 mg by mouth daily.      Marland Kitchen omeprazole (PRILOSEC) 20 MG capsule Take 20 mg by mouth daily.    Earney Navy Bicarbonate (ZEGERID) 20-1100 MG CAPS Take 1 capsule by mouth.    Marland Kitchen OVER THE COUNTER MEDICATION Tru Biotics, Enzyme Defense, Natural Calm  . pravastatin (PRAVACHOL) 20 MG tablet Take 20 mg by mouth daily.    Marland Kitchen tiotropium (SPIRIVA) 18 MCG inhalation capsule Place 18 mcg into inhaler and inhale daily.    . budesonide-formoterol (SYMBICORT) 160-4.5 MCG/ACT inhaler Inhale 2 puffs into the lungs 2 (two) times daily.  . budesonide-formoterol (SYMBICORT) 160-4.5 MCG/ACT inhaler Inhale 2 puffs into the lungs 2 (two) times daily.  . [DISCONTINUED] dexlansoprazole (DEXILANT) 60 MG capsule Take 60 mg by mouth daily.   No facility-administered encounter medications on file as of 04/29/2016.     Allergies as of 04/29/2016 - Review Complete 04/29/2016  Allergen Reaction Noted  . Aspirin  01/27/2011    Past Medical History:  Diagnosis Date  . Arthritis   . COPD (chronic obstructive pulmonary disease) ([place])   . GERD (gastroesophageal reflux disease)   . Hyperglycemia   . Hyperlipidemia   . Hypertension   . Osteopenia   . Thyroid disease     Past Surgical History:  Procedure Laterality Date  . APPENDECTOMY  1963  . HERNIA REPAIR  2000  . right ankle surgery Right    X 2   . THYROIDECTOMY    .  TOTAL ABDOMINAL HYSTERECTOMY  1979    Family History  Problem Relation Age of Onset  . Hypertension Father   . Hypertension Mother   . Stroke Mother   . Heart attack Mother   . Allergies Mother     Social History   Social History  . Marital status: Legally Separated    Spouse name: N/A  . Number of children: 3  . Years of education: N/A   Occupational History  . retired    Social History Main Topics  . Smoking status: Former Smoker    Packs/day: 1.00    Years: 25.00    Types: Cigarettes    Quit date: 03/31/2006  . Smokeless tobacco: Never Used  . Alcohol use 0.0 oz/week     Comment: rare  .  Drug use: No  . Sexual activity: Not on file   Other Topics Concern  . Not on file   Social History Narrative  . No narrative on file    Review of systems: Review of Systems  Constitutional: Negative for fever and chills.  HENT: Negative.   Eyes: Negative for blurred vision.  Respiratory: as per HPI  Cardiovascular: Negative for chest pain and palpitations.  Gastrointestinal: Negative for vomiting, diarrhea, blood per rectum. Genitourinary: Negative for dysuria, urgency, frequency and hematuria.  Musculoskeletal: Negative for myalgias, back pain and joint pain.  Skin: Negative for itching and rash.  Neurological: Negative for dizziness, tremors, focal weakness, seizures and loss of consciousness.  Endo/Heme/Allergies: Negative for environmental allergies.  Psychiatric/Behavioral: Negative for depression, suicidal ideas and hallucinations.  All other systems reviewed and are negative.   Physical Exam: Blood pressure 132/70, pulse 89, height 5\' 4"  (1.626 m), SpO2 95 %. Gen:      No acute distress HEENT:  EOMI, sclera anicteric Neck:     No masses; no thyromegaly Lungs:    Clear to auscultation bilaterally; Diminished breath sounds, normal respiratory effort CV:         Regular rate and rhythm; no murmurs Abd:      + bowel sounds; soft, non-tender; no palpable masses, no distension Ext:    No edema; adequate peripheral perfusion Skin:      Warm and dry; no rash Neuro: alert and oriented x 3 Psych: normal mood and affect  Data Reviewed: PFTs  02/18/11 FVC 2.42 [117%) FEV1 1.46 (69%) F/F 46 TLC 112% DLCO 69% Moderate obstructive defect, mild reduction in diffusion capacity.  08/23/15 FVC 2.14 [71%] FEV1 0.94 [41%] F/F 44 TLC 114% DLCO 57% Severe obstructive defect with moderate reduction in diffusion capacity  CT chest 02/26/15- mild left lower lobe atelectasis, moderate emphysematous changes. Images reviewed.  Assessment:  Severe COPD. GOLD stage D She is  maintained on Symbicort and Spiriva. She feels that that daliresp is helping with her symptoms. Given the copay high she wants to continue using the [place] as it has helped with her symptoms. She has not had any exacerbations since starting this medication.  I discussed pulmonary rehabilitation but she is on already on an exercise program with a personal trainer. She is a candidate for low-dose screening CT given his smoking history but she is not interested in it now given the associated costs.  Plan/Recommendations: - Continue daliresp, Symbicort, Clearance Coots MD  [facility] Pager [phone] 04/29/2016, 1:54 PM  CC: Ernestene Kiel, MD

## 2016-04-29 NOTE — Patient Instructions (Signed)
Continue using your inhalers and Daliresp  Return to clinic in 6 months

## 2016-05-07 ENCOUNTER — Other Ambulatory Visit: Payer: Self-pay | Admitting: Internal Medicine

## 2016-05-07 DIAGNOSIS — N6310 Unspecified lump in the right breast, unspecified quadrant: Secondary | ICD-10-CM

## 2016-05-08 ENCOUNTER — Inpatient Hospital Stay (HOSPITAL_COMMUNITY)
Admission: EM | Admit: 2016-05-08 | Discharge: 2016-05-12 | DRG: 470 | Disposition: A | Discharge status: Home Health | Payer: PPO | Attending: Internal Medicine | Admitting: Internal Medicine

## 2016-05-08 ENCOUNTER — Emergency Department (HOSPITAL_COMMUNITY): Payer: PPO

## 2016-05-08 ENCOUNTER — Encounter (HOSPITAL_COMMUNITY): Payer: Self-pay

## 2016-05-08 ENCOUNTER — Telehealth (INDEPENDENT_AMBULATORY_CARE_PROVIDER_SITE_OTHER): Payer: Self-pay | Admitting: Orthopaedic Surgery

## 2016-05-08 DIAGNOSIS — T148XXA Other injury of unspecified body region, initial encounter: Secondary | ICD-10-CM | POA: Diagnosis not present

## 2016-05-08 DIAGNOSIS — E039 Hypothyroidism, unspecified: Secondary | ICD-10-CM | POA: Diagnosis not present

## 2016-05-08 DIAGNOSIS — Y9239 Other specified sports and athletic area as the place of occurrence of the external cause: Secondary | ICD-10-CM

## 2016-05-08 DIAGNOSIS — Z923 Personal history of irradiation: Secondary | ICD-10-CM | POA: Diagnosis not present

## 2016-05-08 DIAGNOSIS — Z791 Long term (current) use of non-steroidal anti-inflammatories (NSAID): Secondary | ICD-10-CM | POA: Diagnosis not present

## 2016-05-08 DIAGNOSIS — J449 Chronic obstructive pulmonary disease, unspecified: Secondary | ICD-10-CM | POA: Diagnosis not present

## 2016-05-08 DIAGNOSIS — N63 Unspecified lump in unspecified breast: Secondary | ICD-10-CM | POA: Diagnosis not present

## 2016-05-08 DIAGNOSIS — S299XXA Unspecified injury of thorax, initial encounter: Secondary | ICD-10-CM | POA: Diagnosis not present

## 2016-05-08 DIAGNOSIS — Z823 Family history of stroke: Secondary | ICD-10-CM

## 2016-05-08 DIAGNOSIS — R269 Unspecified abnormalities of gait and mobility: Secondary | ICD-10-CM | POA: Diagnosis not present

## 2016-05-08 DIAGNOSIS — Z96642 Presence of left artificial hip joint: Secondary | ICD-10-CM

## 2016-05-08 DIAGNOSIS — K219 Gastro-esophageal reflux disease without esophagitis: Secondary | ICD-10-CM | POA: Diagnosis present

## 2016-05-08 DIAGNOSIS — I10 Essential (primary) hypertension: Secondary | ICD-10-CM | POA: Diagnosis not present

## 2016-05-08 DIAGNOSIS — S7292XA Unspecified fracture of left femur, initial encounter for closed fracture: Secondary | ICD-10-CM | POA: Diagnosis present

## 2016-05-08 DIAGNOSIS — Z419 Encounter for procedure for purposes other than remedying health state, unspecified: Secondary | ICD-10-CM

## 2016-05-08 DIAGNOSIS — R110 Nausea: Secondary | ICD-10-CM | POA: Diagnosis not present

## 2016-05-08 DIAGNOSIS — S72002A Fracture of unspecified part of neck of left femur, initial encounter for closed fracture: Secondary | ICD-10-CM | POA: Diagnosis not present

## 2016-05-08 DIAGNOSIS — Z90710 Acquired absence of both cervix and uterus: Secondary | ICD-10-CM

## 2016-05-08 DIAGNOSIS — E890 Postprocedural hypothyroidism: Secondary | ICD-10-CM | POA: Diagnosis present

## 2016-05-08 DIAGNOSIS — S72042A Displaced fracture of base of neck of left femur, initial encounter for closed fracture: Secondary | ICD-10-CM | POA: Diagnosis not present

## 2016-05-08 DIAGNOSIS — W19XXXA Unspecified fall, initial encounter: Secondary | ICD-10-CM

## 2016-05-08 DIAGNOSIS — Z7951 Long term (current) use of inhaled steroids: Secondary | ICD-10-CM

## 2016-05-08 DIAGNOSIS — Z8249 Family history of ischemic heart disease and other diseases of the circulatory system: Secondary | ICD-10-CM

## 2016-05-08 DIAGNOSIS — E860 Dehydration: Secondary | ICD-10-CM | POA: Diagnosis present

## 2016-05-08 DIAGNOSIS — Z87891 Personal history of nicotine dependence: Secondary | ICD-10-CM

## 2016-05-08 DIAGNOSIS — Z79899 Other long term (current) drug therapy: Secondary | ICD-10-CM

## 2016-05-08 DIAGNOSIS — E871 Hypo-osmolality and hyponatremia: Secondary | ICD-10-CM

## 2016-05-08 DIAGNOSIS — J9811 Atelectasis: Secondary | ICD-10-CM | POA: Diagnosis not present

## 2016-05-08 DIAGNOSIS — Z471 Aftercare following joint replacement surgery: Secondary | ICD-10-CM | POA: Diagnosis not present

## 2016-05-08 DIAGNOSIS — M8580 Other specified disorders of bone density and structure, unspecified site: Secondary | ICD-10-CM | POA: Diagnosis present

## 2016-05-08 DIAGNOSIS — Z886 Allergy status to analgesic agent status: Secondary | ICD-10-CM | POA: Diagnosis not present

## 2016-05-08 DIAGNOSIS — D72829 Elevated white blood cell count, unspecified: Secondary | ICD-10-CM | POA: Diagnosis present

## 2016-05-08 DIAGNOSIS — E785 Hyperlipidemia, unspecified: Secondary | ICD-10-CM | POA: Diagnosis present

## 2016-05-08 DIAGNOSIS — W010XXA Fall on same level from slipping, tripping and stumbling without subsequent striking against object, initial encounter: Secondary | ICD-10-CM | POA: Diagnosis present

## 2016-05-08 DIAGNOSIS — M25552 Pain in left hip: Secondary | ICD-10-CM | POA: Diagnosis not present

## 2016-05-08 HISTORY — DX: Personal history of irradiation: Z92.3

## 2016-05-08 HISTORY — DX: Migraine, unspecified, not intractable, without status migrainosus: G43.909

## 2016-05-08 HISTORY — DX: Adverse effect of unspecified anesthetic, initial encounter: T41.45XA

## 2016-05-08 HISTORY — DX: Unspecified lump in the right breast, unspecified quadrant: N63.10

## 2016-05-08 HISTORY — DX: Unspecified asthma, uncomplicated: J45.909

## 2016-05-08 HISTORY — DX: Hypothyroidism, unspecified: E03.9

## 2016-05-08 HISTORY — DX: Other complications of anesthesia, initial encounter: T88.59XA

## 2016-05-08 HISTORY — DX: Personal history of other diseases of the digestive system: Z87.19

## 2016-05-08 HISTORY — DX: Pneumonia, unspecified organism: J18.9

## 2016-05-08 LAB — LIPASE, BLOOD: Lipase: 29 U/L (ref 11–51)

## 2016-05-08 LAB — PROTIME-INR
INR: 1.03
Prothrombin Time: 13.5 seconds (ref 11.4–15.2)

## 2016-05-08 LAB — COMPREHENSIVE METABOLIC PANEL
ALT: 20 U/L (ref 14–54)
AST: 21 U/L (ref 15–41)
Albumin: 4 g/dL (ref 3.5–5.0)
Alkaline Phosphatase: 65 U/L (ref 38–126)
Anion gap: 10 (ref 5–15)
BUN: 11 mg/dL (ref 6–20)
CHLORIDE: 99 mmol/L — AB (ref 101–111)
CO2: 24 mmol/L (ref 22–32)
CREATININE: 0.94 mg/dL (ref 0.44–1.00)
Calcium: 9.1 mg/dL (ref 8.9–10.3)
GFR calc non Af Amer: 60 mL/min — ABNORMAL LOW (ref >60)
Glucose, Bld: 129 mg/dL — ABNORMAL HIGH (ref 65–99)
POTASSIUM: 3.6 mmol/L (ref 3.5–5.1)
SODIUM: 133 mmol/L — AB (ref 135–145)
Total Bilirubin: 0.6 mg/dL (ref 0.3–1.2)
Total Protein: 6.6 g/dL (ref 6.5–8.1)

## 2016-05-08 LAB — CBC WITH DIFFERENTIAL/PLATELET
Basophils Absolute: 0 10*3/uL (ref 0.0–0.1)
Basophils Relative: 0 %
EOS PCT: 0 %
Eosinophils Absolute: 0 10*3/uL (ref 0.0–0.7)
HEMATOCRIT: 42.4 % (ref 36.0–46.0)
HEMOGLOBIN: 14.1 g/dL (ref 12.0–15.0)
LYMPHS ABS: 1 10*3/uL (ref 0.7–4.0)
LYMPHS PCT: 6 %
MCH: 30.4 pg (ref 26.0–34.0)
MCHC: 33.3 g/dL (ref 30.0–36.0)
MCV: 91.4 fL (ref 78.0–100.0)
Monocytes Absolute: 0.7 10*3/uL (ref 0.1–1.0)
Monocytes Relative: 4 %
NEUTROS ABS: 14.9 10*3/uL — AB (ref 1.7–7.7)
Neutrophils Relative %: 90 %
PLATELETS: 315 10*3/uL (ref 150–400)
RBC: 4.64 MIL/uL (ref 3.87–5.11)
RDW: 13.1 % (ref 11.5–15.5)
WBC: 16.6 10*3/uL — AB (ref 4.0–10.5)

## 2016-05-08 MED ORDER — MOMETASONE FURO-FORMOTEROL FUM 200-5 MCG/ACT IN AERO
2.0000 | INHALATION_SPRAY | Freq: Two times a day (BID) | RESPIRATORY_TRACT | Status: DC
  Administered 2016-05-11: 2 via RESPIRATORY_TRACT
  Filled 2016-05-08 (×2): qty 8.8

## 2016-05-08 MED ORDER — ENSURE ENLIVE PO LIQD
237.0000 mL | Freq: Two times a day (BID) | ORAL | Status: DC
  Administered 2016-05-10 – 2016-05-11 (×2): 237 mL via ORAL

## 2016-05-08 MED ORDER — SODIUM CHLORIDE 0.9 % IV SOLN
INTRAVENOUS | Status: DC
  Administered 2016-05-08 – 2016-05-10 (×2): via INTRAVENOUS

## 2016-05-08 MED ORDER — MORPHINE SULFATE (PF) 2 MG/ML IV SOLN: 2.0000 mg | INTRAVENOUS | Status: DC | PRN

## 2016-05-08 MED ORDER — TIOTROPIUM BROMIDE MONOHYDRATE 18 MCG IN CAPS
18.0000 ug | ORAL_CAPSULE | Freq: Every day | RESPIRATORY_TRACT | Status: DC
  Filled 2016-05-08 (×2): qty 5

## 2016-05-08 MED ORDER — LEVOTHYROXINE SODIUM 88 MCG PO TABS
88.0000 ug | ORAL_TABLET | Freq: Every day | ORAL | Status: DC
  Administered 2016-05-09 – 2016-05-12 (×4): 88 ug via ORAL
  Filled 2016-05-08 (×4): qty 1

## 2016-05-08 MED ORDER — ONDANSETRON HCL 4 MG/2ML IJ SOLN
4.0000 mg | Freq: Once | INTRAMUSCULAR | Status: AC
  Administered 2016-05-08: 4 mg via INTRAVENOUS
  Filled 2016-05-08: qty 2

## 2016-05-08 MED ORDER — ROFLUMILAST 500 MCG PO TABS
500.0000 ug | ORAL_TABLET | Freq: Every day | ORAL | Status: DC
  Administered 2016-05-09 – 2016-05-12 (×4): 500 ug via ORAL
  Filled 2016-05-08 (×4): qty 1

## 2016-05-08 MED ORDER — PRAVASTATIN SODIUM 20 MG PO TABS
20.0000 mg | ORAL_TABLET | Freq: Every day | ORAL | Status: DC
  Administered 2016-05-09 – 2016-05-12 (×4): 20 mg via ORAL
  Filled 2016-05-08 (×4): qty 1

## 2016-05-08 MED ORDER — MORPHINE SULFATE (PF) 4 MG/ML IV SOLN
4.0000 mg | INTRAVENOUS | Status: DC | PRN
  Administered 2016-05-08 – 2016-05-09 (×7): 4 mg via INTRAVENOUS
  Filled 2016-05-08 (×7): qty 1

## 2016-05-08 MED ORDER — FENTANYL CITRATE (PF) 100 MCG/2ML IJ SOLN
50.0000 ug | Freq: Once | INTRAMUSCULAR | Status: AC
  Administered 2016-05-08: 50 ug via INTRAVENOUS
  Filled 2016-05-08: qty 2

## 2016-05-08 MED ORDER — ONDANSETRON HCL 4 MG/2ML IJ SOLN
4.0000 mg | Freq: Four times a day (QID) | INTRAMUSCULAR | Status: DC | PRN
  Administered 2016-05-08 – 2016-05-09 (×5): 4 mg via INTRAVENOUS
  Filled 2016-05-08 (×4): qty 2

## 2016-05-08 MED ORDER — ALBUTEROL SULFATE (2.5 MG/3ML) 0.083% IN NEBU
2.5000 mg | INHALATION_SOLUTION | Freq: Four times a day (QID) | RESPIRATORY_TRACT | Status: DC
  Administered 2016-05-08: 2.5 mg via RESPIRATORY_TRACT
  Filled 2016-05-08 (×2): qty 3

## 2016-05-08 MED ORDER — AMLODIPINE BESYLATE 5 MG PO TABS
5.0000 mg | ORAL_TABLET | Freq: Every day | ORAL | Status: DC
  Administered 2016-05-09 – 2016-05-12 (×4): 5 mg via ORAL
  Filled 2016-05-08 (×4): qty 1

## 2016-05-08 MED ORDER — MORPHINE SULFATE (PF) 4 MG/ML IV SOLN
4.0000 mg | Freq: Once | INTRAVENOUS | Status: AC
  Administered 2016-05-08: 4 mg via INTRAVENOUS
  Filled 2016-05-08: qty 1

## 2016-05-08 NOTE — ED Provider Notes (Signed)
Emergency Department Provider Note   I have reviewed the triage vital signs and the nursing notes.   HISTORY  Chief Complaint Hip Injury   HPI Linda [name] is a 71 y.o. female with PMH of COPD, GERD, HLD, HTN, and osteopenia [place] to the emergency department for evaluation of left hip pain after mechanical fall. The patient states she is at the gym and trying to get down from a piece of exercise equipment when she lost her footing and fell. She had sudden onset severe left hip pain made worse with movement. Her pain severity is severe with no radiation. She describes a sharp. She received fentanyl with EMS which improved her symptoms slightly. No prior history of hip fracture. Denies head trauma, neck pain, chest pain. Patient does not take blood thinners. She notes initially she had some mild numbness in the left leg but this has resolved.    Past Medical History:  Diagnosis Date  . Arthritis    "qwhere; hands, feet, different body parts from time to time" (05/08/2016)  . Asthma   . Complication of anesthesia    "they used an adult tube going down my throat; caused alot of problems; was told to always have pediatric tube placed" (05/08/2016)  . COPD (chronic obstructive pulmonary disease) ([place])   . GERD (gastroesophageal reflux disease)   . History of hiatal hernia   . Hyperglycemia   . Hyperlipidemia   . Hypertension   . Hypothyroidism   . Lump of breast, right    "suppose to have it checked soon" (05/08/2016)  . Migraine    "none for years" (05/08/2016)  . Osteopenia   . Pneumonia "several times"  . Status post radioactive iodine thyroid ablation   . Thyroid disease     Patient Active Problem List   Diagnosis Date Noted  . Hypothyroidism 05/09/2016  . Closed left femoral fracture ([place]) 05/08/2016  . COPD, group D, by GOLD 2017 classification ([place]) 01/28/2011    Past Surgical History:  Procedure Laterality Date  . ANKLE SURGERY Right X 2   fibrocystic tumors come  up in my joints  . APPENDECTOMY  1963  . DILATION AND CURETTAGE OF UTERUS  "several"   "when I was young"  . INGUINAL HERNIA REPAIR Bilateral 2000  . POLYPECTOMY     "vocal cord"  . TONSILLECTOMY    . TUBAL LIGATION    . VAGINAL HYSTERECTOMY  1979      Allergies Aspirin  Family History  Problem Relation Age of Onset  . Hypertension Father   . Hypertension Mother   . Stroke Mother   . Heart attack Mother   . Allergies Mother     Social History Social History  Substance Use Topics  . Smoking status: Former Smoker    Packs/day: 1.00    Years: 25.00    Types: Cigarettes    Quit date: 03/31/2006  . Smokeless tobacco: Never Used  . Alcohol use 0.0 oz/week     Comment: 05/08/2016 "I'll have a drink on special occasions; usually wine"    Review of Systems  Constitutional: No fever/chills Eyes: No visual changes. ENT: No sore throat. Cardiovascular: Denies chest pain. Respiratory: Denies shortness of breath. Gastrointestinal: No abdominal pain.  No nausea, no vomiting.  No diarrhea.  No constipation. Genitourinary: Negative for dysuria. Musculoskeletal: Negative for back pain. Positive left hip pain with movement.  Skin: Negative for rash. Neurological: Negative for headaches, focal weakness or numbness.  10-point ROS otherwise negative.  ____________________________________________   PHYSICAL EXAM:  VITAL SIGNS: ED Triage Vitals  Enc Vitals Group     BP 05/08/16 1451 181/92     Pulse Rate 05/08/16 1451 91     Resp 05/08/16 1451 16     Temp 05/08/16 1451 97.5 F (36.4 C)     SpO2 05/08/16 1451 91 %     Pain Score 05/08/16 1441 10   Constitutional: Alert and oriented. Well appearing and in no acute distress. Eyes: Conjunctivae are normal.  Head: Atraumatic. Nose: No congestion/rhinnorhea. Mouth/Throat: Mucous membranes are moist.  Neck: No stridor. No cervical spine tenderness to palpation. Cardiovascular: Normal rate, regular rhythm. Good peripheral  circulation. Grossly normal heart sounds.   Respiratory: Normal respiratory effort.  No retractions. Lungs CTAB. Gastrointestinal: Soft and nontender. No distention.  Musculoskeletal: LLE shortening with severe pain with movement. DP/PT pulses intact. Normal sensation.   Neurologic:  Normal speech and language. No gross focal neurologic deficits are appreciated.  Skin:  Skin is warm, dry and intact. No rash noted. Psychiatric: Mood and affect are normal. Speech and behavior are normal.  ____________________________________________   LABS (all labs ordered are listed, but only abnormal results are displayed)  Labs Reviewed  COMPREHENSIVE METABOLIC PANEL - Abnormal; Notable for the following:       Result Value   Sodium 133 (*)    Chloride 99 (*)    Glucose, Bld 129 (*)    GFR calc non Af Amer 60 (*)    All other components within normal limits  CBC WITH DIFFERENTIAL/PLATELET - Abnormal; Notable for the following:    WBC 16.6 (*)    Neutro Abs 14.9 (*)    All other components within normal limits  CBC - Abnormal; Notable for the following:    WBC 12.0 (*)    All other components within normal limits  COMPREHENSIVE METABOLIC PANEL - Abnormal; Notable for the following:    Chloride 98 (*)    Glucose, Bld 117 (*)    Creatinine, Ser 1.40 (*)    Total Protein 6.0 (*)    GFR calc non Af Amer 37 (*)    GFR calc Af Amer 43 (*)    All other components within normal limits  SURGICAL PCR SCREEN  URINE CULTURE  LIPASE, BLOOD  PROTIME-INR  PROTIME-INR  URINALYSIS, ROUTINE W REFLEX MICROSCOPIC   ____________________________________________  EKG   EKG Interpretation  Date/Time:  Thursday May 08 2016 16:50:11 EST Ventricular Rate:  75 PR Interval:    QRS Duration: 89 QT Interval:  395 QTC Calculation: 442 R Axis:   71 Text Interpretation:  Sinus rhythm Biatrial enlargement Minimal ST depression, inferior leads No STEMI.  Confirmed by Clytie Shetley MD, [facility] Pettry [phone]) on 05/08/2016  5:18:04 PM       ____________________________________________  RADIOLOGY  Dg Chest 1 View  Result Date: 05/08/2016 CLINICAL DATA:  71 year old female tripped and fell on exercise machine. Proximal left femur fracture. Initial encounter. EXAM: CHEST 1 VIEW COMPARISON:  Chest radiographs 06/01/2013.  Chest CTA 02/26/2015. FINDINGS: AP supine view of the chest 1553 hours. Stable cardiac size at the upper limits of normal. Other mediastinal contours are within normal limits. Visualized tracheal air column is within normal limits. No pneumothorax, pulmonary edema, pleural effusion or confluent pulmonary opacity. Large lung volumes, centrilobular emphysema demonstrated in 2016. IMPRESSION: Emphysema.  No acute cardiopulmonary abnormality. Electronically Signed   By: Genevie Ann M.D.   On: 05/08/2016 16:11   Dg Hip Unilat With Pelvis 2-3 Views  Left  Result Date: 05/08/2016 CLINICAL DATA:  71 year old female tripped and fell on exercise machine. Initial encounter. EXAM: DG HIP (WITH OR WITHOUT PELVIS) 2-3V LEFT COMPARISON:  None. FINDINGS: There is a comminuted fracture of the left femoral neck with varus impaction. The left proximal femur intertrochanteric segment appears to remain intact. The left femoral head remains normally located at the left acetabulum. The right femoral head is normally located. The pelvis appears intact. SI joints within normal limits. Negative visible bowel gas pattern. IMPRESSION: Acute left femoral neck fracture appears comminuted with varus impaction. The intertrochanteric segment appears intact, with no other acute fracture or dislocation identified about the pelvis. Electronically Signed   By: Genevie Ann M.D.   On: 05/08/2016 16:10    ____________________________________________   PROCEDURES  Procedure(s) performed:   Procedures  None ____________________________________________   INITIAL IMPRESSION / ASSESSMENT AND PLAN / ED COURSE  Pertinent labs & imaging results  that were available during my care of the patient were reviewed by me and considered in my medical decision making (see chart for details).  Patient presents to the emergency department for evaluation of severe left hip pain after mechanical fall. The left lower extremity is extremely tender to any kind of ROM of the left hip. The leg does appear shortened and slightly rotated externally. Suspect underlying hip fracture with lower suspicion for dislocation. We will obtain x-ray and relief. Patient with no evidence of head or neck trauma. She is not anticoagulated.   05:10 PM Spoke with Dr. Erlinda Hong with Ortho. Plan for OR tomorrow afternoon. Will discuss admission with the hospitalist.   Discussed patient's case with hospitalist. They will place orders for admission. Patient and family (if present) updated with plan. Care transferred to hospitalist service.  I reviewed all nursing notes, vitals, pertinent old records, EKGs, labs, imaging (as available).  ____________________________________________  FINAL CLINICAL IMPRESSION(S) / ED DIAGNOSES  Final diagnoses:  Fall  Closed fracture of left hip, initial encounter ([place])     MEDICATIONS GIVEN DURING THIS VISIT:  Medications  morphine 4 MG/ML injection 4 mg (4 mg Intravenous Given 05/09/16 1020)  mometasone-formoterol (DULERA) 200-5 MCG/ACT inhaler 2 puff (2 puffs Inhalation Not Given 05/09/16 0800)  roflumilast (DALIRESP) tablet 500 mcg (500 mcg Oral Given 05/09/16 1018)  levothyroxine (SYNTHROID, LEVOTHROID) tablet 88 mcg (88 mcg Oral Given 05/09/16 1018)  pravastatin (PRAVACHOL) tablet 20 mg (20 mg Oral Given 05/09/16 1018)  tiotropium (SPIRIVA) inhalation capsule 18 mcg (18 mcg Inhalation Not Given 05/09/16 1000)  ondansetron (ZOFRAN) injection 4 mg (4 mg Intravenous Given 05/09/16 0524)  amLODipine (NORVASC) tablet 5 mg (5 mg Oral Given 05/09/16 1018)  0.9 %  sodium chloride infusion ( Intravenous New Bag/Given 05/08/16 2057)  feeding supplement (ENSURE  ENLIVE) (ENSURE ENLIVE) liquid 237 mL (not administered)  methocarbamol (ROBAXIN) 500 mg in dextrose 5 % 50 mL IVPB (not administered)  HYDROcodone-acetaminophen (NORCO/VICODIN) 5-325 MG per tablet 1-2 tablet (not administered)  albuterol (PROVENTIL) (2.5 MG/3ML) 0.083% nebulizer solution 2.5 mg (not administered)  ondansetron (ZOFRAN) injection 4 mg (4 mg Intravenous Given 05/08/16 1448)  fentaNYL (SUBLIMAZE) injection 50 mcg (50 mcg Intravenous Given 05/08/16 1449)  morphine 4 MG/ML injection 4 mg (4 mg Intravenous Given 05/08/16 1530)     NEW OUTPATIENT MEDICATIONS STARTED DURING THIS VISIT:  None   Note:  This document was prepared using Dragon voice recognition software and may include unintentional dictation errors.  Nanda Quinton, MD Emergency Medicine   Margette Fast, MD 05/09/16 [phone]

## 2016-05-08 NOTE — H&P (Signed)
History and Physical  Linda [name] DOB: 05-15-1945 DOA: 05/08/2016  Referring physician: EDP PCP: Ernestene Kiel, MD   Chief Complaint: mechanical fall , left hip pain  HPI: Linda [name] is a 71 y.o. female h/o HTN, HLD, hypothyroidism, gerd, osteopenia, Prior smoker, h/o copd has been stable, no recent flare, not on home o2, not steroids dependent, per patient she has been having an active life and goes to the gym regularly. Today, she tripped and fell at [facility], report severe left hip pain, and left leg shortening, she is brought to the ED by EMS, hip x ray confirmed "Acute left femoral neck fracture appears comminuted with varus impaction. The intertrochanteric segment appears intact, with no other acute fracture or dislocation identified about the pelvis." , orthopedic Dr Frankey Shown consulted, plan to have surgery on 2/9, hospitalist called to admit the patient.  Patient report the fall is accidental, no recent illness, no fever, no chest pain, no dizziness, no sob, no n/v, no diarrhea, no dysuria, no recent change of medications. Basic labs obtained in the ED with elevated wbc at 16.6, sodium 133, cr, lft wnl. cxr with copd changes, no acute findings, ekg sinus rhythm, no acute st/t changes. Vital stable.   Review of Systems:  Detail per HPI, Review of systems are otherwise negative  Past Medical History:  Diagnosis Date  . Arthritis   . COPD (chronic obstructive pulmonary disease) ([place])   . GERD (gastroesophageal reflux disease)   . Hyperglycemia   . Hyperlipidemia   . Hypertension   . Osteopenia   . Thyroid disease    Past Surgical History:  Procedure Laterality Date  . APPENDECTOMY  1963  . HERNIA REPAIR  2000  . right ankle surgery Right    X 2   . THYROIDECTOMY    . TOTAL ABDOMINAL HYSTERECTOMY  1979   Social History:  reports that she quit smoking about 10 years ago. Her smoking use included Cigarettes. She has a 25.00 pack-year smoking  history. She has never used smokeless tobacco. She reports that she drinks alcohol. She reports that she does not use drugs. Patient lives at home & is able to participate in activities of daily living independently   Allergies  Allergen Reactions  . Aspirin     ulcer    Family History  Problem Relation Age of Onset  . Hypertension Father   . Hypertension Mother   . Stroke Mother   . Heart attack Mother   . Allergies Mother       Prior to Admission medications   Medication Sig Start Date End Date Taking? Authorizing Provider  albuterol (PROAIR HFA) 108 (90 BASE) MCG/ACT inhaler Inhale 2 puffs into the lungs every 6 (six) hours as needed.     Yes Historical Provider, MD  albuterol (PROVENTIL) (2.5 MG/3ML) 0.083% nebulizer solution Take 2.5 mg by nebulization 4 (four) times daily.     Yes Historical Provider, MD  amLODipine (NORVASC) 5 MG tablet Take 5 mg by mouth daily.   Yes Historical Provider, MD  budesonide-formoterol (SYMBICORT) 160-4.5 MCG/ACT inhaler Inhale 2 puffs into the lungs 2 (two) times daily.   Yes Historical Provider, MD  budesonide-formoterol (SYMBICORT) 160-4.5 MCG/ACT inhaler Inhale 2 puffs into the lungs 2 (two) times daily. 01/24/16 05/08/16 Yes Praveen Mannam, MD  Cholecalciferol (VITAMIN D3) 1000 UNITS CAPS Take 1,000 Units by mouth once.     Yes Historical Provider, MD  DALIRESP 500 MCG TABS tablet TAKE ONE TABLET BY MOUTH  ONCE DAILY 12/31/15  Yes Praveen Mannam, MD  levothyroxine (SYNTHROID, LEVOTHROID) 88 MCG tablet Take 88 mcg by mouth daily.     Yes Historical Provider, MD  losartan (COZAAR) 100 MG tablet Take 100 mg by mouth daily.     Yes Historical Provider, MD  Magnesium Chloride (MAGNESIUM DR PO) Take by mouth daily.   Yes Historical Provider, MD  meloxicam (MOBIC) 15 MG tablet Take 15 mg by mouth daily.     Yes Historical Provider, MD  omeprazole (PRILOSEC) 20 MG capsule Take 20 mg by mouth daily.     Yes Historical Provider, MD  Omeprazole-Sodium  Bicarbonate (ZEGERID) 20-1100 MG CAPS Take 1 capsule by mouth.     Yes Historical Provider, MD  OVER THE COUNTER MEDICATION Tru Biotics, Enzyme Defense, Natural Calm   Yes Historical Provider, MD  pravastatin (PRAVACHOL) 20 MG tablet Take 20 mg by mouth daily.     Yes Historical Provider, MD  tiotropium (SPIRIVA) 18 MCG inhalation capsule Place 18 mcg into inhaler and inhale daily.     Yes Historical Provider, MD  budesonide-formoterol (SYMBICORT) 160-4.5 MCG/ACT inhaler Inhale 2 puffs into the lungs 2 (two) times daily. 01/24/16 01/25/16  Marshell Garfinkel, MD    Physical Exam: BP 158/81   Pulse 78   Temp 97.5 F (36.4 C)   Resp 16   SpO2 93%   General:  Does not look comfortable Eyes: PERRL ENT: unremarkable Neck: supple, no JVD Cardiovascular: RRR Respiratory: CTABL Abdomen: soft/ND/ND, positive bowel sounds Skin: no rash Musculoskeletal:  Left leg shortened and externally rotated, left leg/hip significant pain with slight movement Psychiatric: calm/cooperative Neurologic: no focal findings            Labs on Admission:  Basic Metabolic Panel: No results for input(s): NA, K, CL, CO2, GLUCOSE, BUN, CREATININE, CALCIUM, MG, PHOS in the last 168 hours. Liver Function Tests: No results for input(s): AST, ALT, ALKPHOS, BILITOT, PROT, ALBUMIN in the last 168 hours. No results for input(s): LIPASE, AMYLASE in the last 168 hours. No results for input(s): AMMONIA in the last 168 hours. CBC:  Recent Labs Lab 05/08/16 1650  WBC 16.6*  NEUTROABS 14.9*  HGB 14.1  HCT 42.4  MCV 91.4  PLT 315   Cardiac Enzymes: No results for input(s): CKTOTAL, CKMB, CKMBINDEX, TROPONINI in the last 168 hours.  BNP (last 3 results) No results for input(s): BNP in the last 8760 hours.  ProBNP (last 3 results) No results for input(s): PROBNP in the last 8760 hours.  CBG: No results for input(s): GLUCAP in the last 168 hours.  Radiological Exams on Admission: Dg Chest 1 View  Result Date:  05/08/2016 CLINICAL DATA:  71 year old female tripped and fell on exercise machine. Proximal left femur fracture. Initial encounter. EXAM: CHEST 1 VIEW COMPARISON:  Chest radiographs 06/01/2013.  Chest CTA 02/26/2015. FINDINGS: AP supine view of the chest 1553 hours. Stable cardiac size at the upper limits of normal. Other mediastinal contours are within normal limits. Visualized tracheal air column is within normal limits. No pneumothorax, pulmonary edema, pleural effusion or confluent pulmonary opacity. Large lung volumes, centrilobular emphysema demonstrated in 2016. IMPRESSION: Emphysema.  No acute cardiopulmonary abnormality. Electronically Signed   By: Genevie Ann M.D.   On: 05/08/2016 16:11   Dg Hip Unilat With Pelvis 2-3 Views Left  Result Date: 05/08/2016 CLINICAL DATA:  71 year old female tripped and fell on exercise machine. Initial encounter. EXAM: DG HIP (WITH OR WITHOUT PELVIS) 2-3V LEFT COMPARISON:  None. FINDINGS: There  is a comminuted fracture of the left femoral neck with varus impaction. The left proximal femur intertrochanteric segment appears to remain intact. The left femoral head remains normally located at the left acetabulum. The right femoral head is normally located. The pelvis appears intact. SI joints within normal limits. Negative visible bowel gas pattern. IMPRESSION: Acute left femoral neck fracture appears comminuted with varus impaction. The intertrochanteric segment appears intact, with no other acute fracture or dislocation identified about the pelvis. Electronically Signed   By: Genevie Ann M.D.   On: 05/08/2016 16:10      Assessment/Plan Present on Admission: **None**    Left hip fracture from mechanical fall: Patient at baseline is active, no recent illness, she does has copd but no recent flares, stable on home meds, not oxygen or steroids dependent, patient is low to moderate risk for intermediate risk procedures. Prn pain meds, npo after midnight,  Ortho consulted,  plan to have surgery on 2/9  Leukocytosis: likely from stress, no sign of infection , no fever, cxr no infiltrate, will check ua, gentle hydration  Mild hyponatremia: from dehydration, she also has significant amount of pain, gentle hydration, pain control.  Copd: stable, continue home meds.  HTN; continue norvasc, prn hydralazine, hold cozaar perioperatively, resume post op  HLD, continue statin  Hypothyroidism: continue shythroid  DVT prophylaxis: to be determined by ortho post op  Consultants: orthopedics  Code Status: full   Family Communication:  Patient and daughter at bedside  Disposition Plan: admit to med surg  Time spent: 32mins  Keaton Beichner MD, PhD [facility] Pager [phone] If 7PM-7AM, please contact night-coverage at [email or web address], password [facility]

## 2016-05-08 NOTE — Telephone Encounter (Signed)
Dr. Erlinda Hong please call Dr. Laverta Baltimore at [facility] at [phone] please.  Pt has L hip fracture   Pt is in room A3

## 2016-05-08 NOTE — Telephone Encounter (Signed)
See message below °

## 2016-05-08 NOTE — Consult Note (Signed)
I will see patient in the morning with a full consult note.  The plan is for a hip replacement Friday afternoon pending medical optimization.    Azucena Cecil, MD [facility] [phone] 5:15 PM

## 2016-05-08 NOTE — ED Triage Notes (Signed)
Per EMS - pt tripped and fell at [facility]. Pt reports severe left hip pain. Shortening to left leg. Given 157mcg fentanyl with some relief of pain. Denies neck/back pain. No other injuries noted. Hx COPD.

## 2016-05-09 ENCOUNTER — Inpatient Hospital Stay (HOSPITAL_COMMUNITY): Payer: PPO | Admitting: Anesthesiology

## 2016-05-09 ENCOUNTER — Inpatient Hospital Stay (HOSPITAL_COMMUNITY): Payer: PPO

## 2016-05-09 ENCOUNTER — Encounter (HOSPITAL_COMMUNITY)
Admission: EM | Disposition: A | Discharge status: Home Health | Payer: Self-pay | Source: Home / Self Care | Attending: Internal Medicine

## 2016-05-09 DIAGNOSIS — E890 Postprocedural hypothyroidism: Secondary | ICD-10-CM | POA: Diagnosis present

## 2016-05-09 DIAGNOSIS — J449 Chronic obstructive pulmonary disease, unspecified: Secondary | ICD-10-CM

## 2016-05-09 DIAGNOSIS — E039 Hypothyroidism, unspecified: Secondary | ICD-10-CM

## 2016-05-09 HISTORY — PX: TOTAL HIP ARTHROPLASTY: SHX124

## 2016-05-09 HISTORY — DX: Postprocedural hypothyroidism: E89.0

## 2016-05-09 LAB — URINALYSIS, ROUTINE W REFLEX MICROSCOPIC
BILIRUBIN URINE: NEGATIVE
GLUCOSE, UA: NEGATIVE mg/dL
HGB URINE DIPSTICK: NEGATIVE
KETONES UR: NEGATIVE mg/dL
Leukocytes, UA: NEGATIVE
Nitrite: NEGATIVE
PROTEIN: NEGATIVE mg/dL
Specific Gravity, Urine: 1.009 (ref 1.005–1.030)
pH: 8 (ref 5.0–8.0)

## 2016-05-09 LAB — PROTIME-INR
INR: 1.05
PROTHROMBIN TIME: 13.8 s (ref 11.4–15.2)

## 2016-05-09 LAB — COMPREHENSIVE METABOLIC PANEL
ALT: 18 U/L (ref 14–54)
AST: 23 U/L (ref 15–41)
Albumin: 3.7 g/dL (ref 3.5–5.0)
Alkaline Phosphatase: 63 U/L (ref 38–126)
Anion gap: 10 (ref 5–15)
BUN: 17 mg/dL (ref 6–20)
CALCIUM: 9.3 mg/dL (ref 8.9–10.3)
CHLORIDE: 98 mmol/L — AB (ref 101–111)
CO2: 28 mmol/L (ref 22–32)
CREATININE: 1.4 mg/dL — AB (ref 0.44–1.00)
GFR, EST AFRICAN AMERICAN: 43 mL/min — AB (ref >60)
GFR, EST NON AFRICAN AMERICAN: 37 mL/min — AB (ref >60)
Glucose, Bld: 117 mg/dL — ABNORMAL HIGH (ref 65–99)
Potassium: 3.9 mmol/L (ref 3.5–5.1)
Sodium: 136 mmol/L (ref 135–145)
Total Bilirubin: 0.7 mg/dL (ref 0.3–1.2)
Total Protein: 6 g/dL — ABNORMAL LOW (ref 6.5–8.1)

## 2016-05-09 LAB — SURGICAL PCR SCREEN
MRSA, PCR: NEGATIVE
Staphylococcus aureus: NEGATIVE

## 2016-05-09 LAB — CBC
HCT: 41.9 % (ref 36.0–46.0)
Hemoglobin: 13.8 g/dL (ref 12.0–15.0)
MCH: 30.3 pg (ref 26.0–34.0)
MCHC: 32.9 g/dL (ref 30.0–36.0)
MCV: 92.1 fL (ref 78.0–100.0)
PLATELETS: 323 10*3/uL (ref 150–400)
RBC: 4.55 MIL/uL (ref 3.87–5.11)
RDW: 13.3 % (ref 11.5–15.5)
WBC: 12 10*3/uL — ABNORMAL HIGH (ref 4.0–10.5)

## 2016-05-09 SURGERY — ARTHROPLASTY, HIP, TOTAL, ANTERIOR APPROACH
Anesthesia: Spinal | Laterality: Left

## 2016-05-09 SURGERY — ARTHROPLASTY, HIP, TOTAL, ANTERIOR APPROACH
Anesthesia: General | Laterality: Left

## 2016-05-09 MED ORDER — SCOPOLAMINE 1 MG/3DAYS TD PT72
1.0000 | MEDICATED_PATCH | TRANSDERMAL | Status: DC
  Administered 2016-05-09: 1.5 mg via TRANSDERMAL
  Filled 2016-05-09 (×2): qty 1

## 2016-05-09 MED ORDER — CEFAZOLIN SODIUM-DEXTROSE 2-4 GM/100ML-% IV SOLN
2.0000 g | INTRAVENOUS | Status: AC
  Administered 2016-05-09: 2 g via INTRAVENOUS
  Filled 2016-05-09 (×2): qty 100

## 2016-05-09 MED ORDER — ALBUTEROL SULFATE (2.5 MG/3ML) 0.083% IN NEBU: 2.5000 mg | INHALATION_SOLUTION | RESPIRATORY_TRACT | Status: DC | PRN

## 2016-05-09 MED ORDER — METOCLOPRAMIDE HCL 5 MG/ML IJ SOLN: 5.0000 mg | Freq: Three times a day (TID) | INTRAMUSCULAR | Status: DC | PRN

## 2016-05-09 MED ORDER — SODIUM CHLORIDE 0.9 % IR SOLN
Status: DC | PRN
  Administered 2016-05-09: 1000 mL

## 2016-05-09 MED ORDER — PHENYLEPHRINE HCL 10 MG/ML IJ SOLN
INTRAVENOUS | Status: DC | PRN
  Administered 2016-05-09: 20 ug/min via INTRAVENOUS

## 2016-05-09 MED ORDER — POLYETHYLENE GLYCOL 3350 17 G PO PACK: 17.0000 g | PACK | Freq: Two times a day (BID) | ORAL | 0 refills | Status: DC

## 2016-05-09 MED ORDER — BUPIVACAINE IN DEXTROSE 0.75-8.25 % IT SOLN
INTRATHECAL | Status: DC | PRN
  Administered 2016-05-09: 2 mL via INTRATHECAL

## 2016-05-09 MED ORDER — PROPOFOL 10 MG/ML IV BOLUS
INTRAVENOUS | Status: DC | PRN
  Administered 2016-05-09 (×2): 20 mg via INTRAVENOUS

## 2016-05-09 MED ORDER — CHLORHEXIDINE GLUCONATE CLOTH 2 % EX PADS: 6.0000 | MEDICATED_PAD | Freq: Once | CUTANEOUS | Status: DC

## 2016-05-09 MED ORDER — PHENOL 1.4 % MT LIQD: 1.0000 | OROMUCOSAL | Status: DC | PRN

## 2016-05-09 MED ORDER — FENTANYL CITRATE (PF) 100 MCG/2ML IJ SOLN
INTRAMUSCULAR | Status: DC | PRN
  Administered 2016-05-09 (×2): 50 ug via INTRAVENOUS

## 2016-05-09 MED ORDER — LACTATED RINGERS IV SOLN
INTRAVENOUS | Status: DC | PRN
  Administered 2016-05-09 (×2): via INTRAVENOUS

## 2016-05-09 MED ORDER — HYDRALAZINE HCL 20 MG/ML IJ SOLN: 10.0000 mg | Freq: Four times a day (QID) | INTRAMUSCULAR | Status: DC | PRN

## 2016-05-09 MED ORDER — MENTHOL 3 MG MT LOZG: 1.0000 | LOZENGE | OROMUCOSAL | Status: DC | PRN

## 2016-05-09 MED ORDER — FERROUS SULFATE 325 (65 FE) MG PO TABS
325.0000 mg | ORAL_TABLET | Freq: Three times a day (TID) | ORAL | Status: DC
  Administered 2016-05-11: 325 mg via ORAL
  Filled 2016-05-09 (×4): qty 1

## 2016-05-09 MED ORDER — CHLORHEXIDINE GLUCONATE 4 % EX LIQD: 60.0000 mL | Freq: Once | CUTANEOUS | Status: DC

## 2016-05-09 MED ORDER — HYDROCODONE-ACETAMINOPHEN 7.5-325 MG PO TABS: 1.0000 | ORAL_TABLET | ORAL | 0 refills | Status: DC | PRN

## 2016-05-09 MED ORDER — PHENYLEPHRINE HCL 10 MG/ML IJ SOLN
INTRAMUSCULAR | Status: DC | PRN
  Administered 2016-05-09: 80 ug via INTRAVENOUS
  Administered 2016-05-09 (×2): 40 ug via INTRAVENOUS
  Administered 2016-05-09: 80 ug via INTRAVENOUS
  Administered 2016-05-09: 40 ug via INTRAVENOUS

## 2016-05-09 MED ORDER — METHOCARBAMOL 500 MG PO TABS: 500.0000 mg | ORAL_TABLET | Freq: Four times a day (QID) | ORAL | 0 refills | Status: DC | PRN

## 2016-05-09 MED ORDER — PROMETHAZINE HCL 25 MG/ML IJ SOLN: 6.2500 mg | INTRAMUSCULAR | Status: DC | PRN

## 2016-05-09 MED ORDER — LIDOCAINE HCL (CARDIAC) 20 MG/ML IV SOLN
INTRAVENOUS | Status: DC | PRN
  Administered 2016-05-09: 50 mg via INTRATRACHEAL

## 2016-05-09 MED ORDER — FENTANYL CITRATE (PF) 100 MCG/2ML IJ SOLN
INTRAMUSCULAR | Status: AC
  Filled 2016-05-09: qty 4

## 2016-05-09 MED ORDER — METHOCARBAMOL 1000 MG/10ML IJ SOLN
500.0000 mg | Freq: Four times a day (QID) | INTRAVENOUS | Status: DC | PRN
  Filled 2016-05-09: qty 5

## 2016-05-09 MED ORDER — HYDROMORPHONE HCL 1 MG/ML IJ SOLN: 0.2500 mg | INTRAMUSCULAR | Status: DC | PRN

## 2016-05-09 MED ORDER — RIVAROXABAN 10 MG PO TABS: 10.0000 mg | ORAL_TABLET | Freq: Every day | ORAL | 0 refills | Status: DC

## 2016-05-09 MED ORDER — MIDAZOLAM HCL 5 MG/5ML IJ SOLN
INTRAMUSCULAR | Status: DC | PRN
  Administered 2016-05-09 (×2): 1 mg via INTRAVENOUS

## 2016-05-09 MED ORDER — TRANEXAMIC ACID 1000 MG/10ML IV SOLN
1000.0000 mg | INTRAVENOUS | Status: AC
  Administered 2016-05-09: 1000 mg via INTRAVENOUS
  Filled 2016-05-09: qty 10

## 2016-05-09 MED ORDER — FERROUS SULFATE 325 (65 FE) MG PO TABS: 325.0000 mg | ORAL_TABLET | Freq: Three times a day (TID) | ORAL | Status: DC

## 2016-05-09 MED ORDER — DOCUSATE SODIUM 100 MG PO CAPS: 100.0000 mg | ORAL_CAPSULE | Freq: Two times a day (BID) | ORAL | 0 refills | Status: DC

## 2016-05-09 MED ORDER — CEFAZOLIN SODIUM-DEXTROSE 2-4 GM/100ML-% IV SOLN
2.0000 g | Freq: Four times a day (QID) | INTRAVENOUS | Status: AC
  Administered 2016-05-09 – 2016-05-10 (×2): 2 g via INTRAVENOUS
  Filled 2016-05-09 (×4): qty 100

## 2016-05-09 MED ORDER — HYDROCODONE-ACETAMINOPHEN 5-325 MG PO TABS
1.0000 | ORAL_TABLET | Freq: Four times a day (QID) | ORAL | Status: DC | PRN
  Administered 2016-05-09: 1 via ORAL
  Administered 2016-05-10 – 2016-05-12 (×5): 2 via ORAL
  Filled 2016-05-09 (×7): qty 2
  Filled 2016-05-09: qty 1

## 2016-05-09 MED ORDER — MIDAZOLAM HCL 2 MG/2ML IJ SOLN
INTRAMUSCULAR | Status: AC
  Filled 2016-05-09: qty 2

## 2016-05-09 MED ORDER — PROPOFOL 10 MG/ML IV BOLUS
INTRAVENOUS | Status: AC
  Filled 2016-05-09: qty 20

## 2016-05-09 MED ORDER — METOCLOPRAMIDE HCL 5 MG PO TABS: 5.0000 mg | ORAL_TABLET | Freq: Three times a day (TID) | ORAL | Status: DC | PRN

## 2016-05-09 MED ORDER — CALCIUM CARBONATE ANTACID 500 MG PO CHEW: 400.0000 mg | CHEWABLE_TABLET | Freq: Three times a day (TID) | ORAL | Status: DC | PRN

## 2016-05-09 MED ORDER — DIPHENHYDRAMINE HCL 25 MG PO CAPS
25.0000 mg | ORAL_CAPSULE | Freq: Four times a day (QID) | ORAL | Status: DC | PRN
  Administered 2016-05-10: 25 mg via ORAL
  Filled 2016-05-09: qty 1

## 2016-05-09 MED ORDER — ENOXAPARIN SODIUM 40 MG/0.4ML ~~LOC~~ SOLN
40.0000 mg | SUBCUTANEOUS | Status: DC
  Administered 2016-05-10 – 2016-05-12 (×3): 40 mg via SUBCUTANEOUS
  Filled 2016-05-09 (×3): qty 0.4

## 2016-05-09 MED ORDER — SCOPOLAMINE 1 MG/3DAYS TD PT72
MEDICATED_PATCH | TRANSDERMAL | Status: AC
  Filled 2016-05-09: qty 1

## 2016-05-09 MED ORDER — HYDROMORPHONE HCL 2 MG/ML IJ SOLN: 0.2500 mg | INTRAMUSCULAR | Status: DC | PRN

## 2016-05-09 MED ORDER — PROPOFOL 500 MG/50ML IV EMUL
INTRAVENOUS | Status: DC | PRN
  Administered 2016-05-09: 75 ug/kg/min via INTRAVENOUS

## 2016-05-09 MED ORDER — POVIDONE-IODINE 10 % EX SWAB: 2.0000 "application " | Freq: Once | CUTANEOUS | Status: DC

## 2016-05-09 MED ORDER — DIPHENHYDRAMINE HCL 50 MG/ML IJ SOLN
INTRAMUSCULAR | Status: DC | PRN
  Administered 2016-05-09: 12.5 mg via INTRAVENOUS

## 2016-05-09 MED ORDER — PANTOPRAZOLE SODIUM 40 MG PO TBEC
40.0000 mg | DELAYED_RELEASE_TABLET | Freq: Every day | ORAL | Status: DC
  Administered 2016-05-09 – 2016-05-12 (×4): 40 mg via ORAL
  Filled 2016-05-09 (×4): qty 1

## 2016-05-09 MED ORDER — LACTATED RINGERS IV SOLN
Freq: Once | INTRAVENOUS | Status: AC
  Administered 2016-05-09: 14:00:00 via INTRAVENOUS

## 2016-05-09 SURGICAL SUPPLY — 62 items
ADH SKN CLS APL DERMABOND .7 (GAUZE/BANDAGES/DRESSINGS) ×2
BLADE SAW SGTL 18X1.27X75 (BLADE) ×2 IMPLANT
BLADE SAW SGTL 18X1.27X75MM (BLADE) ×1
BLADE SURG ROTATE 9660 (MISCELLANEOUS) IMPLANT
CAPT HIP TOTAL 2 ×2 IMPLANT
CELLS DAT CNTRL 66122 CELL SVR (MISCELLANEOUS) IMPLANT
COVER BACK TABLE 24X17X13 BIG (DRAPES) IMPLANT
COVER SURGICAL LIGHT HANDLE (MISCELLANEOUS) ×3 IMPLANT
DERMABOND ADVANCED (GAUZE/BANDAGES/DRESSINGS) ×4
DERMABOND ADVANCED .7 DNX12 (GAUZE/BANDAGES/DRESSINGS) ×1 IMPLANT
DRAPE C-ARM 42X72 X-RAY (DRAPES) ×3 IMPLANT
DRAPE IMP U-DRAPE 54X76 (DRAPES) ×3 IMPLANT
DRAPE STERI IOBAN 125X83 (DRAPES) ×3 IMPLANT
DRAPE U-SHAPE 47X51 STRL (DRAPES) ×9 IMPLANT
DRSG AQUACEL AG ADV 3.5X10 (GAUZE/BANDAGES/DRESSINGS) ×3 IMPLANT
DRSG TEGADERM 4X4.75 (GAUZE/BANDAGES/DRESSINGS) ×3 IMPLANT
DURAPREP 26ML APPLICATOR (WOUND CARE) ×3 IMPLANT
ELECT BLADE TIP CTD 4 INCH (ELECTRODE) IMPLANT
ELECT REM PT RETURN 9FT ADLT (ELECTROSURGICAL) ×3
ELECTRODE REM PT RTRN 9FT ADLT (ELECTROSURGICAL) ×1 IMPLANT
EVACUATOR 1/8 PVC DRAIN (DRAIN) ×1 IMPLANT
FACESHIELD WRAPAROUND (MASK) ×3 IMPLANT
FACESHIELD WRAPAROUND OR TEAM (MASK) ×1 IMPLANT
GAUZE SPONGE 2X2 8PLY STRL LF (GAUZE/BANDAGES/DRESSINGS) ×1 IMPLANT
GLOVE BIOGEL PI IND STRL 7.5 (GLOVE) ×1 IMPLANT
GLOVE BIOGEL PI IND STRL 8 (GLOVE) ×1 IMPLANT
GLOVE BIOGEL PI INDICATOR 7.5 (GLOVE) ×2
GLOVE BIOGEL PI INDICATOR 8 (GLOVE) ×2
GLOVE ECLIPSE 8.0 STRL XLNG CF (GLOVE) ×6 IMPLANT
GLOVE ORTHO TXT STRL SZ7.5 (GLOVE) ×3 IMPLANT
GOWN BRE IMP PREV XXLGXLNG (GOWN DISPOSABLE) ×3 IMPLANT
GOWN STRL REIN 3XL XLG LVL4 (GOWN DISPOSABLE) ×3 IMPLANT
GOWN STRL REUS W/ TWL LRG LVL3 (GOWN DISPOSABLE) ×2 IMPLANT
GOWN STRL REUS W/TWL LRG LVL3 (GOWN DISPOSABLE) ×6
KIT BASIN OR (CUSTOM PROCEDURE TRAY) ×3 IMPLANT
KIT ROOM TURNOVER OR (KITS) ×3 IMPLANT
MANIFOLD NEPTUNE II (INSTRUMENTS) ×3 IMPLANT
NS IRRIG 1000ML POUR BTL (IV SOLUTION) ×3 IMPLANT
PACK TOTAL JOINT (CUSTOM PROCEDURE TRAY) ×3 IMPLANT
PACK UNIVERSAL I (CUSTOM PROCEDURE TRAY) ×3 IMPLANT
PAD ARMBOARD 7.5X6 YLW CONV (MISCELLANEOUS) ×6 IMPLANT
RESTRAINT LIMB HOLDER UNIV (RESTRAINTS) ×1 IMPLANT
RETRACTOR WND ALEXIS 18 MED (MISCELLANEOUS) ×1 IMPLANT
RTRCTR WOUND ALEXIS 18CM MED (MISCELLANEOUS)
SPONGE GAUZE 2X2 STER 10/PKG (GAUZE/BANDAGES/DRESSINGS) ×2
SPONGE LAP 4X18 X RAY DECT (DISPOSABLE) IMPLANT
STAPLER VISISTAT 35W (STAPLE) ×3 IMPLANT
SUCTION FRAZIER HANDLE 10FR (MISCELLANEOUS) ×2
SUCTION TUBE FRAZIER 10FR DISP (MISCELLANEOUS) ×1 IMPLANT
SUT MNCRL AB 4-0 PS2 18 (SUTURE) ×3 IMPLANT
SUT STRATAFIX 0 PDS 27 VIOLET (SUTURE) ×3
SUT VIC AB 1 CT1 27 (SUTURE) ×3
SUT VIC AB 1 CT1 27XBRD ANBCTR (SUTURE) ×4 IMPLANT
SUT VIC AB 2-0 CT1 27 (SUTURE) ×3
SUT VIC AB 2-0 CT1 TAPERPNT 27 (SUTURE) ×2 IMPLANT
SUT VLOC 180 0 24IN GS25 (SUTURE) ×3 IMPLANT
SUTURE STRATFX 0 PDS 27 VIOLET (SUTURE) IMPLANT
TOWEL OR 17X24 6PK STRL BLUE (TOWEL DISPOSABLE) ×3 IMPLANT
TOWEL OR 17X26 10 PK STRL BLUE (TOWEL DISPOSABLE) ×3 IMPLANT
TRAY CATH 16FR W/PLASTIC CATH (SET/KITS/TRAYS/PACK) IMPLANT
TRAY FOLEY CATH 16FRSI W/METER (SET/KITS/TRAYS/PACK) IMPLANT
WATER STERILE IRR 1000ML POUR (IV SOLUTION) ×9 IMPLANT

## 2016-05-09 NOTE — Progress Notes (Signed)
Patient had her son bring her inhalers.  She states that she spoke with respiratory and they said the hospital does not carry these medications.

## 2016-05-09 NOTE — Progress Notes (Signed)
Pt complaining of quite painful reflux. Dr. Tamala Julian notified and verbal order taken for omeprazole daily per pt's home medications. Will follow through with the order.

## 2016-05-09 NOTE — Transfer of Care (Signed)
Immediate Anesthesia Transfer of Care Note  Patient: Billings Cellar  Procedure(s) Performed: Procedure(s): TOTAL HIP ARTHROPLASTY ANTERIOR APPROACH (Left)  Patient Location: PACU  Anesthesia Type:MAC and Regional  Level of Consciousness: sedated and patient cooperative  Airway & Oxygen Therapy: Patient Spontanous Breathing and Patient connected to nasal cannula oxygen  Post-op Assessment: Report given to RN and Post -op Vital signs reviewed and stable  Post vital signs: Reviewed and stable  Last Vitals:  Vitals:   05/08/16 2234 05/09/16 0501  BP: (!) 151/71 123/71  Pulse: 70 72  Resp: 20 18  Temp: 36.6 C 37.2 C    Last Pain:  Vitals:   05/09/16 1050  TempSrc:   PainSc: 5       Patients Stated Pain Goal: 0 (13/14/38 8875)  Complications: No apparent anesthesia complications

## 2016-05-09 NOTE — Progress Notes (Signed)
Initial Nutrition Assessment  DOCUMENTATION CODES:   Not applicable  INTERVENTION:    Advance diet as medically appropriate, add supplements as able  NUTRITION DIAGNOSIS:   Increased nutrient needs related to  (post op healing) as evidenced by estimated needs   GOAL:   Patient will meet greater than or equal to 90% of their needs  MONITOR:   Diet advancement, PO intake, Labs, Weight trends, I & O's  REASON FOR ASSESSMENT:   Malnutrition Screening Tool  ASSESSMENT:   71 y.o. Female h/o HTN, HLD, hypothyroidism, gerd, osteopenia, Prior smoker, h/o copd has been stable, no recent flare, not on home o2, not steroids dependent, per patient she has been having an active life and goes to the gym regularly. Today, she tripped and fell at [facility], report severe left hip pain, and left leg shortening, she is brought to the ED by EMS, hip x ray confirmed "Acute left femoral neck fracture appears comminuted with varus impaction.  Patient currently in [place]. Nutrient needs increased for post-op healing. Would benefit from oral nutrition supplements. Labs and medications reviewed. RD unable to complete Nutrition Focused Physical Exam at this time.  Diet Order:  Diet NPO time specified Except for: Sips with Meds  Skin:  Reviewed, no issues  Last BM:  2/8  Height:   Ht Readings from Last 1 Encounters:  05/09/16 5\' 4"  (1.626 m)    Weight:   Wt Readings from Last 1 Encounters:  05/09/16 127 lb (57.6 kg)    Ideal Body Weight:  54.5 kg  BMI:  Body mass index is 21.8 kg/m.  Estimated Nutritional Needs:   Kcal:  1400-1600  Protein:  70-80 gm  Fluid:  >/= 1.5 L  EDUCATION NEEDS:   No education needs identified at this time  Arthur Holms, RD, LDN Pager #: [phone] After-Hours Pager #: [phone]

## 2016-05-09 NOTE — Anesthesia Postprocedure Evaluation (Signed)
Anesthesia Post Note  Patient: Linda [name]  Procedure(s) Performed: Procedure(s) (LRB): TOTAL HIP ARTHROPLASTY ANTERIOR APPROACH (Left)  Patient location during evaluation: PACU Anesthesia Type: Spinal Level of consciousness: awake and alert Pain management: pain level controlled Vital Signs Assessment: post-procedure vital signs reviewed and stable Respiratory status: spontaneous breathing and respiratory function stable Cardiovascular status: blood pressure returned to baseline and stable Postop Assessment: spinal receding Anesthetic complications: no       Last Vitals:  Vitals:   05/09/16 1735 05/09/16 1740  BP: 139/65   Pulse: 66 68  Resp: 15 15  Temp:      Last Pain:  Vitals:   05/09/16 1735  TempSrc:   PainSc: Tyler Deis

## 2016-05-09 NOTE — Op Note (Signed)
NAME:  Linda [name]                ACCOUNT NO.: [id number]      MEDICAL RECORD NO.: [id number]      FACILITY:  [facility]      PHYSICIAN:  Paralee Cancel D  DATE OF BIRTH:  [birth year]     DATE OF PROCEDURE:  05/09/2016                                 OPERATIVE REPORT         PREOPERATIVE DIAGNOSIS: Left  hip femoral neck fracture.      POSTOPERATIVE DIAGNOSIS:  Left hip femoral neck fracture.      PROCEDURE:  Left total hip replacement through an anterior approach   utilizing DePuy THR system, component size 63mm pinnacle cup, a size 32+4 neutral   Altrex liner, a size 4 Hi Tri Lock stem with a 32+5 delta ceramic   ball.      SURGEON:  Pietro Cassis. Alvan Dame, M.D.      ASSISTANT:  Danae Orleans, PA-C     ANESTHESIA:  Spinal.      SPECIMENS:  None.      COMPLICATIONS:  None.      BLOOD LOSS:  250 cc     DRAINS:  None.      INDICATION OF THE PROCEDURE:  Linda [name] is a 71 y.o. female who had   presented to ER after falling at the gym.  She was noted to have a displaced femoral neck fracture.   She was admitted to the Hospitalist service per protocol.  She was seen and evaluated.  We discussed options of total versus hemi arthoplasty for management of her.  Due to chances of failure of the hemiarthroplasty within her life span we feel that the total hip would be her best option.   Consent was obtained for   benefit of pain relief.  Specific risk of infection, DVT, component   failure, dislocation, need for revision surgery, as well discussion of   the anterior versus posterior approach were reviewed.  Consent was   obtained for benefit of anterior pain relief through an anterior   approach.      PROCEDURE IN DETAIL:  The patient was brought to operative theater.   Once adequate anesthesia, preoperative antibiotics, 2gm of Ancef administered.   The patient was positioned supine on the OSI Hanna table.  Once adequate   padding of boney process was carried  out, we had predraped out the hip, and  used fluoroscopy to confirm orientation of the pelvis and position.      The left hip was then prepped and draped from proximal iliac crest to   mid thigh with shower curtain technique.      Time-out was performed identifying the patient, planned procedure, and   extremity.     An incision was then made 2 cm distal and lateral to the   anterior superior iliac spine extending over the orientation of the   tensor fascia lata muscle and sharp dissection was carried down to the   fascia of the muscle and protractor placed in the soft tissues.      The fascia was then incised.  The muscle belly was identified and swept   laterally and retractor placed along the superior neck.  Following   cauterization of the circumflex vessels and  removing some pericapsular   fat, a second cobra retractor was placed on the inferior neck.  A third   retractor was placed on the anterior acetabulum after elevating the   anterior rectus.  A L-capsulotomy was along the line of the   superior neck to the trochanteric fossa, then extended proximally and   distally.  Tag sutures were placed and the retractors were then placed   intracapsular.  We then identified the trochanteric fossa and   orientation of my neck cut, confirmed this radiographically   and then made a neck osteotomy with the femur on traction.  The femoral   head was removed without difficulty or complication.  Traction was let   off and retractors were placed posterior and anterior around the   acetabulum.      The labrum and foveal tissue were debrided.  I began reaming with a 44mm   reamer and reamed up to 37mm reamer with good bony bed preparation and a 63mm   cup was chosen.  The final 55mm Pinnacle cup was then impacted under fluoroscopy  to confirm the depth of penetration and orientation with respect to   abduction.  A screw was placed followed by the hole eliminator.  The final   32+4 neutral  Altrex liner was impacted with good visualized rim fit.  The cup was positioned anatomically within the acetabular portion of the pelvis.      At this point, the femur was rolled at 80 degrees.  Further capsule was   released off the inferior aspect of the femoral neck.  I then   released the superior capsule proximally.  The hook was placed laterally   along the femur and elevated manually and held in position with the bed   hook.  The leg was then extended and adducted with the leg rolled to 100   degrees of external rotation.  Once the proximal femur was fully   exposed, I used a box osteotome to set orientation.  I then began   broaching with the starting chili pepper broach and passed this by hand and then broached up to 4.  With the 4 broach in place I chose a high offset neck and did several trial reductions.  The offset was appropriate, leg lengths   appeared to be equal best matched with the +5 head ball, confirmed radiographically.   Given these findings, I went ahead and dislocated the hip, repositioned all   retractors and positioned the right hip in the extended and abducted position.  The final 4 Hi Tri Lock stem was   chosen and it was impacted down to the level of neck cut.  Based on this   and the trial reduction, a 32+5 delta ceramic ball was chosen and   impacted onto a clean and dry trunnion, and the hip was reduced.  The   hip had been irrigated throughout the case again at this point.  I did   reapproximate the superior capsular leaflet to the anterior leaflet   using #1 Vicryl.  The fascia of the   tensor fascia lata muscle was then reapproximated using #1 Vicryl and #0 Stratafix sutures.  The   remaining wound was closed with 2-0 Vicryl and running 4-0 Monocryl.   The hip was cleaned, dried, and dressed sterilely using Dermabond and   Aquacel dressing.  She was then brought   to recovery room in stable condition tolerating the procedure well.    Danae Orleans,  PA-C  was present for the entirety of the case involved from   preoperative positioning, perioperative retractor management, general   facilitation of the case, as well as primary wound closure as assistant.            Pietro Cassis Alvan Dame, M.D.        05/09/2016 3:31 PM

## 2016-05-09 NOTE — Consult Note (Signed)
Reason for Consult: left femoral neck fracture Referring Physician:  Eliseo Squires, MD  Linda [name] is an 71 y.o. female.  HPI: Linda [name] is a 71 y.o. female h/o HTN, HLD, hypothyroidism, gerd, osteopenia, Prior smoker, h/o copd has been stable, no recent flare, not on home o2, not steroids dependent, per patient she has been having an active life and goes to the gym regularly. Today, she tripped and fell at [facility], report severe left hip pain, and left leg shortening, she is brought to the ED by EMS, hip x ray confirmed "Acute left femoral neck fracture appears comminuted with varus impaction.   Seen and evaluated today.  No other complaints other than left hip No prior fractures Generalized arthritic issues for which she takes meloxicam  Past Medical History:  Diagnosis Date  . Arthritis    "qwhere; hands, feet, different body parts from time to time" (05/08/2016)  . Asthma   . Complication of anesthesia    "they used an adult tube going down my throat; caused alot of problems; was told to always have pediatric tube placed" (05/08/2016)  . COPD (chronic obstructive pulmonary disease) ([place])   . GERD (gastroesophageal reflux disease)   . History of hiatal hernia   . Hyperglycemia   . Hyperlipidemia   . Hypertension   . Hypothyroidism   . Lump of breast, right    "suppose to have it checked soon" (05/08/2016)  . Migraine    "none for years" (05/08/2016)  . Osteopenia   . Pneumonia "several times"  . Status post radioactive iodine thyroid ablation   . Thyroid disease     Past Surgical History:  Procedure Laterality Date  . 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 APPENDECTOMY  1963  . DILATION AND CURETTAGE OF UTERUS  "several"   "when I was young"  . INGUINAL HERNIA REPAIR Bilateral 2000  . POLYPECTOMY     "vocal cord"  . TONSILLECTOMY    . TUBAL LIGATION    . VAGINAL HYSTERECTOMY  1979    Family History  Problem Relation Age of Onset  .  Hypertension Father   . Hypertension Mother   . Stroke Mother   . Heart attack Mother   . Allergies Mother     Social History:  reports that she quit smoking about 10 years ago. Her smoking use included Cigarettes. She has a 25.00 pack-year smoking history. She has never used smokeless tobacco. She reports that she drinks alcohol. She reports that she does not use drugs.  Allergies:  Allergies  Allergen Reactions  . Aspirin     ulcer    Medications: I have reviewed the patient's current medications.  Results for orders placed or performed during the hospital encounter of 05/08/16 (from the past 24 hour(s))  Comprehensive metabolic panel     Status: Abnormal   Collection Time: 05/08/16  4:50 PM  Result Value Ref Range   Sodium 133 (L) 135 - 145 mmol/L   Potassium 3.6 3.5 - 5.1 mmol/L   Chloride 99 (L) 101 - 111 mmol/L   CO2 24 22 - 32 mmol/L   Glucose, Bld 129 (H) 65 - 99 mg/dL   BUN 11 6 - 20 mg/dL   Creatinine, Ser 0.94 0.44 - 1.00 mg/dL   Calcium 9.1 8.9 - 10.3 mg/dL   Total Protein 6.6 6.5 - 8.1 g/dL   Albumin 4.0 3.5 - 5.0 g/dL   AST 21  15 - 41 U/L   ALT 20 14 - 54 U/L   Alkaline Phosphatase 65 38 - 126 U/L   Total Bilirubin 0.6 0.3 - 1.2 mg/dL   GFR calc non Af Amer 60 (L) >60 mL/min   GFR calc Af Amer >60 >60 mL/min   Anion gap 10 5 - 15  Lipase, blood     Status: None   Collection Time: 05/08/16  4:50 PM  Result Value Ref Range   Lipase 29 11 - 51 U/L  CBC with Differential     Status: Abnormal   Collection Time: 05/08/16  4:50 PM  Result Value Ref Range   WBC 16.6 (H) 4.0 - 10.5 K/uL   RBC 4.64 3.87 - 5.11 MIL/uL   Hemoglobin 14.1 12.0 - 15.0 g/dL   HCT 42.4 36.0 - 46.0 %   MCV 91.4 78.0 - 100.0 fL   MCH 30.4 26.0 - 34.0 pg   MCHC 33.3 30.0 - 36.0 g/dL   RDW 13.1 11.5 - 15.5 %   Platelets 315 150 - 400 K/uL   Neutrophils Relative % 90 %   Neutro Abs 14.9 (H) 1.7 - 7.7 K/uL   Lymphocytes Relative 6 %   Lymphs Abs 1.0 0.7 - 4.0 K/uL   Monocytes  Relative 4 %   Monocytes Absolute 0.7 0.1 - 1.0 K/uL   Eosinophils Relative 0 %   Eosinophils Absolute 0.0 0.0 - 0.7 K/uL   Basophils Relative 0 %   Basophils Absolute 0.0 0.0 - 0.1 K/uL  Protime-INR     Status: None   Collection Time: 05/08/16  4:50 PM  Result Value Ref Range   Prothrombin Time 13.5 11.4 - 15.2 seconds   INR 1.03   Surgical PCR screen     Status: None   Collection Time: 05/09/16 12:14 AM  Result Value Ref Range   MRSA, PCR NEGATIVE NEGATIVE   Staphylococcus aureus NEGATIVE NEGATIVE  CBC     Status: Abnormal   Collection Time: 05/09/16  5:23 AM  Result Value Ref Range   WBC 12.0 (H) 4.0 - 10.5 K/uL   RBC 4.55 3.87 - 5.11 MIL/uL   Hemoglobin 13.8 12.0 - 15.0 g/dL   HCT 41.9 36.0 - 46.0 %   MCV 92.1 78.0 - 100.0 fL   MCH 30.3 26.0 - 34.0 pg   MCHC 32.9 30.0 - 36.0 g/dL   RDW 13.3 11.5 - 15.5 %   Platelets 323 150 - 400 K/uL  Comprehensive metabolic panel     Status: Abnormal   Collection Time: 05/09/16  5:23 AM  Result Value Ref Range   Sodium 136 135 - 145 mmol/L   Potassium 3.9 3.5 - 5.1 mmol/L   Chloride 98 (L) 101 - 111 mmol/L   CO2 28 22 - 32 mmol/L   Glucose, Bld 117 (H) 65 - 99 mg/dL   BUN 17 6 - 20 mg/dL   Creatinine, Ser 1.40 (H) 0.44 - 1.00 mg/dL   Calcium 9.3 8.9 - 10.3 mg/dL   Total Protein 6.0 (L) 6.5 - 8.1 g/dL   Albumin 3.7 3.5 - 5.0 g/dL   AST 23 15 - 41 U/L   ALT 18 14 - 54 U/L   Alkaline Phosphatase 63 38 - 126 U/L   Total Bilirubin 0.7 0.3 - 1.2 mg/dL   GFR calc non Af Amer 37 (L) >60 mL/min   GFR calc Af Amer 43 (L) >60 mL/min   Anion gap 10 5 - 15  Protime-INR  Status: None   Collection Time: 05/09/16  5:23 AM  Result Value Ref Range   Prothrombin Time 13.8 11.4 - 15.2 seconds   INR 1.05     X-ray: CLINICAL DATA:  71 year old female tripped and fell on exercise machine. Initial encounter.  EXAM: DG HIP (WITH OR WITHOUT PELVIS) 2-3V LEFT  COMPARISON:  None.  FINDINGS: There is a comminuted fracture of the left  femoral neck with varus impaction. The left proximal femur intertrochanteric segment appears to remain intact. The left femoral head remains normally located at the left acetabulum.  The right femoral head is normally located. The pelvis appears intact. SI joints within normal limits. Negative visible bowel gas pattern.  IMPRESSION: Acute left femoral neck fracture appears comminuted with varus impaction. The intertrochanteric segment appears intact, with no other acute fracture or dislocation identified about the pelvis.   Electronically Signed   By: Genevie Ann M.D.  ROS  History of stable COPD (smoking history No recent illnesses or hospitalizations No flu No chest pain No abdominal complaints or pain Generalized joint aches  Blood pressure 123/71, pulse 72, temperature 98.9 F (37.2 C), temperature source Oral, resp. rate 18, SpO2 98 %.  Physical Exam  Awake alert oriented Left hip short externally rotated compared to right NVI LLE Shortened respirations with pursing of lips related to COPD history Minor wheezing noted General medical exam reviewed for pertinents  Assessment/Plan: Left hip displaced femoral neck fracture  NPO To OR today for left total hip replacement  Risks benefits, necessity of procedure reviewed including specifics of reasoning of THR versus hemi Consent ordered Post op plan to follow procedure  Tacuma Graffam D 05/09/2016, 12:53 PM

## 2016-05-09 NOTE — Anesthesia Preprocedure Evaluation (Addendum)
Anesthesia Evaluation  Patient identified by MRN, date of birth, ID band Patient awake    Reviewed: Allergy & Precautions, NPO status , Patient's Chart, lab work & pertinent test results  Airway Mallampati: II  TM Distance: >3 FB Neck ROM: Full    Dental no notable dental hx.    Pulmonary COPD, former smoker,    Pulmonary exam normal breath sounds clear to auscultation       Cardiovascular hypertension, Normal cardiovascular exam Rhythm:Regular Rate:Normal     Neuro/Psych negative neurological ROS  negative psych ROS   GI/Hepatic Neg liver ROS, GERD  ,  Endo/Other  negative endocrine ROS  Renal/GU negative Renal ROS  negative genitourinary   Musculoskeletal negative musculoskeletal ROS (+)   Abdominal   Peds negative pediatric ROS (+)  Hematology negative hematology ROS (+)   Anesthesia Other Findings   Reproductive/Obstetrics negative OB ROS                            Lab Results  Component Value Date   WBC 12.0 (H) 05/09/2016   HGB 13.8 05/09/2016   HCT 41.9 05/09/2016   MCV 92.1 05/09/2016   PLT 323 05/09/2016   Lab Results  Component Value Date   CREATININE 1.40 (H) 05/09/2016   BUN 17 05/09/2016   NA 136 05/09/2016   K 3.9 05/09/2016   CL 98 (L) 05/09/2016   CO2 28 05/09/2016   Lab Results  Component Value Date   INR 1.05 05/09/2016   INR 1.03 05/08/2016    Anesthesia Physical Anesthesia Plan  ASA: III  Anesthesia Plan: Spinal and MAC   Post-op Pain Management:    Induction: Intravenous  Airway Management Planned: Natural Airway and Simple Face Mask  Additional Equipment:   Intra-op Plan:   Post-operative Plan: Extubation in OR  Informed Consent: I have reviewed the patients History and Physical, chart, labs and discussed the procedure including the risks, benefits and alternatives for the proposed anesthesia with the patient or authorized  representative who has indicated his/her understanding and acceptance.   Dental advisory given  Plan Discussed with: CRNA and Surgeon  Anesthesia Plan Comments:        Anesthesia Quick Evaluation

## 2016-05-09 NOTE — Plan of Care (Signed)
Problem: Pain Managment: Goal: General experience of comfort will improve Outcome: Progressing Was medicated twice for pain this shift with moderate relief, resting in the bed with eyes closed at this time. No acute distress noted  Problem: Skin Integrity: Goal: Risk for impaired skin integrity will decrease Outcome: Progressing No skin issues noted  Problem: Activity: Goal: Risk for activity intolerance will decrease Outcome: Not Progressing Painful to turn and move left leg  Problem: Nutrition: Goal: Adequate nutrition will be maintained Outcome: Not Progressing Was unable to take PO due to N/V  Problem: Bowel/Gastric: Goal: Will not experience complications related to bowel motility Outcome: Progressing Patient had an episode of severe nausea and vomited once 250 ml of light green emesis and was medicated with Zofran and moderate relief

## 2016-05-09 NOTE — Anesthesia Procedure Notes (Signed)
Spinal  Start time: 05/09/2016 3:25 PM End time: 05/09/2016 3:35 PM Staffing Anesthesiologist: Suzette Battiest Performed: anesthesiologist  Preanesthetic Checklist Completed: patient identified, site marked, surgical consent, pre-op evaluation, timeout performed, IV checked, risks and benefits discussed and monitors and equipment checked Spinal Block Patient position: left lateral decubitus Prep: site prepped and draped and DuraPrep Patient monitoring: blood pressure, continuous pulse ox and heart rate Approach: midline Location: L4-5 Injection technique: single-shot Needle Needle type: Pencan  Needle gauge: 24 G Needle length: 9 cm Needle insertion depth: 5 cm

## 2016-05-09 NOTE — Progress Notes (Signed)
Patient's belongings- iPAD, iPhone and purse were all sent with her family member, Nabihah Marolda

## 2016-05-09 NOTE — Discharge Instructions (Signed)

## 2016-05-09 NOTE — Progress Notes (Signed)
PROGRESS NOTE    Linda [name]  [id number] DOB: 1946-02-26 DOA: 05/08/2016 PCP: Ernestene Kiel, MD   Outpatient Specialists:     Brief Narrative:  Linda [name] is a 71 y.o. female h/o HTN, HLD, hypothyroidism, gerd, osteopenia, Prior smoker, h/o copd has been stable, no recent flare, not on home o2, not steroids dependent, per patient she has been having an active life and goes to the gym regularly. Today, she tripped and fell at [facility], report severe left hip pain, and left leg shortening, she is brought to the ED by EMS, hip x ray confirmed "Acute left femoral neck fracture appears comminuted with varus impaction. The intertrochanteric segment appears intact, with no other acute fracture or dislocation identified about the pelvis." , orthopedic Dr Frankey Shown consulted, plan to have surgery on 2/9, hospitalist called to admit the patient.  Patient report the fall is accidental, no recent illness, no fever, no chest pain, no dizziness, no sob, no n/v, no diarrhea, no dysuria, no recent change of medications. Basic labs obtained in the ED with elevated wbc at 16.6, sodium 133, cr, lft wnl. cxr with copd changes, no acute findings, ekg sinus rhythm, no acute st/t changes. Vital stable.     Assessment & Plan:   Active Problems:   COPD, group D, by GOLD 2017 classification ([place])   Closed left femoral fracture (HCC)   Hypothyroidism   Left hip fracture from mechanical fall: Prn pain meds, npo after midnight,  Ortho consulted, plan to have surgery on 2/9  Leukocytosis: likely from stress, no sign of infection , no fever, cxr no infiltrate, u/a negative  Mild hyponatremia: from dehydration, she also has significant amount of pain, gentle hydration, pain control.  Copd: stable, continue home meds.  HTN; continue norvasc, prn hydralazine, hold cozaar perioperatively, resume post op  HLD, continue statin  Hypothyroidism: continue shythroid    DVT prophylaxis:    Per ortho  Code Status: Full Code   Family Communication: No family at bedside  Disposition Plan:     Consultants:   ortho   Subjective: Anxious about procedure Pain only with cough and moving  Objective: Vitals:   05/08/16 1745 05/08/16 2138 05/08/16 2234 05/09/16 0501  BP: 151/83  (!) 151/71 123/71  Pulse: 75  70 72  Resp: 23  20 18   Temp:   97.9 F (36.6 C) 98.9 F (37.2 C)  TempSrc:   Oral Oral  SpO2: 96% 96% 99% 98%    Intake/Output Summary (Last 24 hours) at 05/09/16 1011 Last data filed at 05/09/16 0800  Gross per 24 hour  Intake                0 ml  Output              450 ml  Net             -450 ml   There were no vitals filed for this visit.  Examination:  General exam: Anxious Respiratory system: diminished, no wheezing Cardiovascular system: S1 & S2 heard, RRR. No JVD, murmurs, rubs, gallops or clicks. No pedal edema. Gastrointestinal system: Abdomen is nondistended, soft and nontender. No organomegaly or masses felt. Normal bowel sounds heard. Central nervous system: Alert and oriented. No focal neurological deficits.     Data Reviewed: I have personally reviewed following labs and imaging studies  CBC:  Recent Labs Lab 05/08/16 1650 05/09/16 0523  WBC 16.6* 12.0*  NEUTROABS 14.9*  --   HGB  14.1 13.8  HCT 42.4 41.9  MCV 91.4 92.1  PLT 315 [id number]   Basic Metabolic Panel:  Recent Labs Lab 05/08/16 1650 05/09/16 0523  NA 133* 136  K 3.6 3.9  CL 99* 98*  CO2 24 28  GLUCOSE 129* 117*  BUN 11 17  CREATININE 0.94 1.40*  CALCIUM 9.1 9.3   GFR: CrCl cannot be calculated (Unknown ideal weight.). Liver Function Tests:  Recent Labs Lab 05/08/16 1650 05/09/16 0523  AST 21 23  ALT 20 18  ALKPHOS 65 63  BILITOT 0.6 0.7  PROT 6.6 6.0*  ALBUMIN 4.0 3.7    Recent Labs Lab 05/08/16 1650  LIPASE 29   No results for input(s): AMMONIA in the last 168 hours. Coagulation Profile:  Recent Labs Lab 05/08/16 1650  05/09/16 0523  INR 1.03 1.05   Cardiac Enzymes: No results for input(s): CKTOTAL, CKMB, CKMBINDEX, TROPONINI in the last 168 hours. BNP (last 3 results) No results for input(s): PROBNP in the last 8760 hours. HbA1C: No results for input(s): HGBA1C in the last 72 hours. CBG: No results for input(s): GLUCAP in the last 168 hours. Lipid Profile: No results for input(s): CHOL, HDL, LDLCALC, TRIG, CHOLHDL, LDLDIRECT in the last 72 hours. Thyroid Function Tests: No results for input(s): TSH, T4TOTAL, FREET4, T3FREE, THYROIDAB in the last 72 hours. Anemia Panel: No results for input(s): VITAMINB12, FOLATE, FERRITIN, TIBC, IRON, RETICCTPCT in the last 72 hours. Urine analysis:    Component Value Date/Time   COLORURINE YELLOW 05/08/2016 0022   APPEARANCEUR CLEAR 05/08/2016 0022   LABSPEC 1.009 05/08/2016 0022   PHURINE 8.0 05/08/2016 0022   GLUCOSEU NEGATIVE 05/08/2016 0022   HGBUR NEGATIVE 05/08/2016 0022   BILIRUBINUR NEGATIVE 05/08/2016 0022   KETONESUR NEGATIVE 05/08/2016 0022   PROTEINUR NEGATIVE 05/08/2016 0022   NITRITE NEGATIVE 05/08/2016 0022   LEUKOCYTESUR NEGATIVE 05/08/2016 0022     ) Recent Results (from the past 240 hour(s))  Surgical PCR screen     Status: None   Collection Time: 05/09/16 12:14 AM  Result Value Ref Range Status   MRSA, PCR NEGATIVE NEGATIVE Final   Staphylococcus aureus NEGATIVE NEGATIVE Final    Comment:        The Xpert SA Assay (FDA approved for NASAL specimens in patients over 80 years of age), is one component of a comprehensive surveillance program.  Test performance has been validated by [facility] for patients greater than or equal to 55 year old. It is not intended to diagnose infection nor to guide or monitor treatment.       Anti-infectives    None       Radiology Studies: Dg Chest 1 View  Result Date: 05/08/2016 CLINICAL DATA:  71 year old female tripped and fell on exercise machine. Proximal left femur fracture.  Initial encounter. EXAM: CHEST 1 VIEW COMPARISON:  Chest radiographs 06/01/2013.  Chest CTA 02/26/2015. FINDINGS: AP supine view of the chest 1553 hours. Stable cardiac size at the upper limits of normal. Other mediastinal contours are within normal limits. Visualized tracheal air column is within normal limits. No pneumothorax, pulmonary edema, pleural effusion or confluent pulmonary opacity. Large lung volumes, centrilobular emphysema demonstrated in 2016. IMPRESSION: Emphysema.  No acute cardiopulmonary abnormality. Electronically Signed   By: Genevie Ann M.D.   On: 05/08/2016 16:11   Dg Hip Unilat With Pelvis 2-3 Views Left  Result Date: 05/08/2016 CLINICAL DATA:  71 year old female tripped and fell on exercise machine. Initial encounter. EXAM: DG HIP (WITH OR WITHOUT PELVIS)  2-3V LEFT COMPARISON:  None. FINDINGS: There is a comminuted fracture of the left femoral neck with varus impaction. The left proximal femur intertrochanteric segment appears to remain intact. The left femoral head remains normally located at the left acetabulum. The right femoral head is normally located. The pelvis appears intact. SI joints within normal limits. Negative visible bowel gas pattern. IMPRESSION: Acute left femoral neck fracture appears comminuted with varus impaction. The intertrochanteric segment appears intact, with no other acute fracture or dislocation identified about the pelvis. Electronically Signed   By: Genevie Ann M.D.   On: 05/08/2016 16:10        Scheduled Meds: . albuterol  2.5 mg Nebulization QID  . amLODipine  5 mg Oral Daily  . feeding supplement (ENSURE ENLIVE)  237 mL Oral BID BM  . levothyroxine  88 mcg Oral Daily  . mometasone-formoterol  2 puff Inhalation BID  . pravastatin  20 mg Oral Daily  . roflumilast  500 mcg Oral Daily  . tiotropium  18 mcg Inhalation Daily   Continuous Infusions: . sodium chloride 75 mL/hr at 05/08/16 2057     LOS: 1 day    Time spent: 35 min    [place], DO [facility] Pager [phone]  If 7PM-7AM, please contact night-coverage [email or web address] Password [email or web address] 05/09/2016, 10:11 AM

## 2016-05-10 LAB — URINE CULTURE: Culture: NO GROWTH

## 2016-05-10 LAB — CBC
HCT: 33.6 % — ABNORMAL LOW (ref 36.0–46.0)
HEMOGLOBIN: 10.9 g/dL — AB (ref 12.0–15.0)
MCH: 30 pg (ref 26.0–34.0)
MCHC: 32.4 g/dL (ref 30.0–36.0)
MCV: 92.6 fL (ref 78.0–100.0)
Platelets: 210 10*3/uL (ref 150–400)
RBC: 3.63 MIL/uL — AB (ref 3.87–5.11)
RDW: 13.5 % (ref 11.5–15.5)
WBC: 9.5 10*3/uL (ref 4.0–10.5)

## 2016-05-10 LAB — BASIC METABOLIC PANEL
Anion gap: 11 (ref 5–15)
BUN: 14 mg/dL (ref 6–20)
CHLORIDE: 99 mmol/L — AB (ref 101–111)
CO2: 24 mmol/L (ref 22–32)
Calcium: 8 mg/dL — ABNORMAL LOW (ref 8.9–10.3)
Creatinine, Ser: 1.12 mg/dL — ABNORMAL HIGH (ref 0.44–1.00)
GFR calc Af Amer: 56 mL/min — ABNORMAL LOW (ref >60)
GFR calc non Af Amer: 48 mL/min — ABNORMAL LOW (ref >60)
GLUCOSE: 110 mg/dL — AB (ref 65–99)
POTASSIUM: 3.5 mmol/L (ref 3.5–5.1)
SODIUM: 134 mmol/L — AB (ref 135–145)

## 2016-05-10 MED ORDER — LOSARTAN POTASSIUM 50 MG PO TABS
100.0000 mg | ORAL_TABLET | Freq: Every day | ORAL | Status: DC
  Administered 2016-05-10 – 2016-05-12 (×3): 100 mg via ORAL
  Filled 2016-05-10 (×3): qty 2

## 2016-05-10 MED ORDER — METHOCARBAMOL 500 MG PO TABS
500.0000 mg | ORAL_TABLET | Freq: Four times a day (QID) | ORAL | Status: DC | PRN
  Administered 2016-05-10: 500 mg via ORAL
  Filled 2016-05-10: qty 1

## 2016-05-10 NOTE — Progress Notes (Signed)
Physical Therapy Treatment Patient Details Name: Linda [name] MRN: [id number] DOB: [birth year] Today's Date: 05/10/2016    History of Present Illness Pt is a 71 yo female admitted on 05/08/16 following a fall resulting from tripping over gym equiment. Pt sustained a left hip fx and underwent a THA on 05/09/16. PMH significant for COPD, GERD, HLD, HTN, Osteopenia, Hypothryoid.     PT Comments    Pt is agreeable to supine exercises as she just returned to bed from being up with Occupational Therapy. Performed supine exercises in HEP with improved quad control noted as reps increased. Pt has good left hip mobility with knee flexion ROM.    Follow Up Recommendations  Home health PT;Supervision - Intermittent     Equipment Recommendations  Rolling walker with 5" wheels;3in1 (PT)    Recommendations for Other Services       Precautions / Restrictions Precautions Precautions: None Precaution Comments: direct anterior Restrictions Weight Bearing Restrictions: Yes LLE Weight Bearing: Weight bearing as tolerated    Mobility  Bed Mobility Overal bed mobility: Needs Assistance Bed Mobility: Supine to Sit;Sit to Supine     Supine to sit: Supervision Sit to supine: Min assist   General bed mobility comments: Pt prefers not getting OOB  Transfers Overall transfer level: Needs assistance Equipment used: Rolling walker (2 wheeled) Transfers: Sit to/from Stand Sit to Stand: Min guard         General transfer comment: vc for safe hand placement  Ambulation/Gait                 Stairs            Wheelchair Mobility    Modified Rankin (Stroke Patients Only)       Balance Overall balance assessment: Needs assistance Sitting-balance support: No upper extremity supported Sitting balance-Leahy Scale: Good Sitting balance - Comments: able to sit EOB without assistance   Standing balance support: Bilateral upper extremity supported;No upper extremity  supported;During functional activity Standing balance-Leahy Scale: Fair Standing balance comment: able to perform sink level grooming                    Cognition Arousal/Alertness: Awake/alert Behavior During Therapy: WFL for tasks assessed/performed;Impulsive Overall Cognitive Status: Within Functional Limits for tasks assessed                      Exercises Total Joint Exercises Ankle Circles/Pumps: AROM;Both;20 reps;Supine Quad Sets: AROM;Right;10 reps;Supine Heel Slides: Right;10 reps;Supine;AAROM Hip ABduction/ADduction: AAROM;Right;10 reps;Supine    General Comments General comments (skin integrity, edema, etc.): vc for safety with RW      Pertinent Vitals/Pain Pain Assessment: 0-10 Pain Score: 3  Pain Location: left hip with mobility Pain Descriptors / Indicators: Aching;Grimacing;Guarding;Moaning Pain Intervention(s): Limited activity within patient's tolerance;Monitored during session;Repositioned;Ice applied    Home Living Family/patient expects to be discharged to:: Private residence Living Arrangements: Alone Available Help at Discharge: Family;Other (Comment);Available PRN/intermittently (church friends) Type of Home: House              Prior Function Level of Independence: Independent      Comments: was living alone prior to admission   PT Goals (current goals can now be found in the care plan section) Acute Rehab PT Goals Patient Stated Goal: to go home and not rehab Progress towards PT goals: Progressing toward goals    Frequency    Min 5X/week      PT Plan Current plan remains appropriate  Co-evaluation             End of Session Equipment Utilized During Treatment: Gait belt Activity Tolerance: Patient limited by pain Patient left: in bed;with call bell/phone within reach;with family/visitor present     Time: [id number] PT Time Calculation (min) (ACUTE ONLY): 16 min  Charges:  $Therapeutic Exercise: 8-22  mins                    G Codes:      Scheryl Marten PT, DPT  [phone]  05/10/2016, 5:01 PM

## 2016-05-10 NOTE — Progress Notes (Signed)
[facility]                                                                              Patient Demographics  Linda [name], is a 72 y.o. female, DOB - [birth year], WN:[id number]  Admit date - 05/08/2016   Admitting Physician Linda Cancel, MD  Outpatient Primary MD for the patient is PROCHNAU,CAROLINE, MD  Outpatient specialists:   LOS - 2  days    Chief Complaint  Patient presents with  . Hip Injury       Brief summary   Linda Mozo Aumanis a 71 y.o.femaleh/o HTN, HLD, hypothyroidism, gerd, osteopenia, Prior smoker, h/o copd has been stable, no recent flare, not on home o2, not steroids dependent, per patient she has been having an active life and goes to the gym regularly. Today, she tripped and fell at [name], report severe left hip pain, and left leg shortening, she is brought to the ED by EMS, hip x ray confirmed "Acute left femoral neck fracture appears comminuted with varus impaction. The intertrochanteric segment appears intact, with no other acute fracture or dislocation identified about the pelvis." , orthopedic Linda [name] consulted, Patient underwent hip surgery on 05/09/16  .    Assessment & Plan    Primary problems Left hip fracture  from mechanical fall: - Postop day #1, status post left total hip replacement -  continue pain control, worked with PT today, lives alone, will likely need short-term rehabilitation - Continue pain control, bowel regimen  Active problems  Leukocytosis: - likely from stress demargination, resolved - no sign of infection, no fever, cxr no infiltrates - UA negative  Mild hyponatremia: - from dehydration, she also has significant amount of pain -  gentle hydration, pain control.  Copd:  -  is currently stable, no wheezing, continue Spiriva, dulera, albuterol nebs   HTN - Currently stable, continue norvasc, losartan, prn hydralazine  HLD - continue statin  Hypothyroidism: - continue  shythroid  Code Status: full  DVT Prophylaxis: SCD's Family Communication: Discussed in detail with the patient, all imaging results, lab results explained to the patient    Disposition Plan:  Time Spent in minutes   25 minutes  Procedures:   left total hip replacement  Consultants:   Orthopedics   Antimicrobials:      Medications  Scheduled Meds: . amLODipine  5 mg Oral Daily  . enoxaparin (LOVENOX) injection  40 mg Subcutaneous Q24H  . feeding supplement (ENSURE ENLIVE)  237 mL Oral BID BM  . ferrous sulfate  325 mg Oral TID PC  . levothyroxine  88 mcg Oral Daily  . mometasone-formoterol  2 puff Inhalation BID  . pantoprazole  40 mg Oral Daily  . pravastatin  20 mg Oral Daily  . roflumilast  500 mcg Oral Daily  . scopolamine  1 patch Transdermal Q72H  . tiotropium  18 mcg Inhalation Daily   Continuous Infusions: PRN Meds:.albuterol, calcium carbonate, diphenhydrAMINE, hydrALAZINE, HYDROcodone-acetaminophen, menthol-cetylpyridinium **OR** phenol, methocarbamol (ROBAXIN)  IV, metoCLOPramide **OR** metoCLOPramide (REGLAN) injection, morphine injection, ondansetron (ZOFRAN) IV   Antibiotics   Anti-infectives    Start  Dose/Rate Route Frequency Ordered Stop   05/09/16 2000  ceFAZolin (ANCEF) IVPB 2g/100 mL premix     2 g 200 mL/hr over 30 Minutes Intravenous Every 6 hours 05/09/16 1849 05/10/16 0344   05/09/16 1345  ceFAZolin (ANCEF) IVPB 2g/100 mL premix     2 g 200 mL/hr over 30 Minutes Intravenous To ShortStay Surgical 05/09/16 1340 05/09/16 1541        Subjective:   Linda [name] was seen and examined today.  Just finished PT, complaining of pain in her hip otherwise stable.  Patient denies dizziness, chest pain, abdominal pain, N/V/D/C, new weakness, numbess, tingling. No acute events overnight.  has shortness of breath due to chronic COPD.    Objective:   Vitals:   05/09/16 1951 05/10/16 0014 05/10/16 0652 05/10/16 1016  BP: (!) 152/69 (!) 147/70  (!) 145/64   Pulse: 89 96 86   Resp:      Temp: 99 F (37.2 C) 100 F (37.8 C) 98.7 F (37.1 C)   TempSrc: Oral Oral Oral   SpO2: 91% 92% 91% 90%  Weight:      Height:        Intake/Output Summary (Last 24 hours) at 05/10/16 1152 Last data filed at 05/10/16 0844  Gross per 24 hour  Intake          2855.53 ml  Output             2025 ml  Net           830.53 ml     Wt Readings from Last 3 Encounters:  05/09/16 57.6 kg (127 lb)  01/24/16 58 kg (127 lb 12.8 oz)  10/09/15 59.8 kg (131 lb 12.8 oz)     Exam  General: Alert and oriented x 3, NAD  HEENT:   Neck: Supple, no JVD  Cardiovascular: S1 S2 auscultated, no rubs, murmurs or gallops. Regular rate and rhythm.  Respiratory: Clear to auscultation bilaterally, no wheezing, rales or rhonchi  Gastrointestinal: Soft, nontender, nondistended, + bowel sounds  Ext: no cyanosis clubbing or edema  Neuro: AAOx3, Cr N's II- XII. Strength 5/5 upper and lower extremities bilaterally  Skin: No rashes  Psych: Normal affect and demeanor, alert and oriented x3    Data Reviewed:  I have personally reviewed following labs and imaging studies  Micro Results Recent Results (from the past 240 hour(s))  Urine culture     Status: None   Collection Time: 05/08/16 12:22 AM  Result Value Ref Range Status   Specimen Description URINE, RANDOM  Final   Special Requests NONE  Final   Culture NO GROWTH  Final   Report Status 05/10/2016 FINAL  Final  Surgical PCR screen     Status: None   Collection Time: 05/09/16 12:14 AM  Result Value Ref Range Status   MRSA, PCR NEGATIVE NEGATIVE Final   Staphylococcus aureus NEGATIVE NEGATIVE Final    Comment:        The Xpert SA Assay (FDA approved for NASAL specimens in patients over 93 years of age), is one component of a comprehensive surveillance program.  Test performance has been validated by [name] for patients greater than or equal to 16 year old. It is not intended to  diagnose infection nor to guide or monitor treatment.     Radiology Reports Dg Chest 1 View  Result Date: 05/08/2016 CLINICAL DATA:  71 year old female tripped and fell on exercise machine. Proximal left femur fracture. Initial encounter. EXAM: CHEST 1  VIEW COMPARISON:  Chest radiographs 06/01/2013.  Chest CTA 02/26/2015. FINDINGS: AP supine view of the chest 1553 hours. Stable cardiac size at the upper limits of normal. Other mediastinal contours are within normal limits. Visualized tracheal air column is within normal limits. No pneumothorax, pulmonary edema, pleural effusion or confluent pulmonary opacity. Large lung volumes, centrilobular emphysema demonstrated in 2016. IMPRESSION: Emphysema.  No acute cardiopulmonary abnormality. Electronically Signed   By: Genevie Ann M.D.   On: 05/08/2016 16:11   Dg C-arm 1-60 Min  Result Date: 05/09/2016 CLINICAL DATA:  Arthroplasty EXAM: OPERATIVE LEFT HIP (WITH PELVIS IF PERFORMED) single VIEWS TECHNIQUE: Fluoroscopic spot image(s) were submitted for interpretation post-operatively. COMPARISON:  None. FINDINGS: LEFT hip total arthroplasty.  No fracture or dislocation IMPRESSION: No complication following LEFT hip arthroplasty. Electronically Signed   By: Suzy Bouchard M.D.   On: 05/09/2016 17:19   Dg Hip Port Unilat With Pelvis 1v Left  Result Date: 05/09/2016 CLINICAL DATA:  Post LEFT hip replacement EXAM: DG HIP (WITH OR WITHOUT PELVIS) 1V PORT LEFT COMPARISON:  Portable exam 1704 hours compared to intraoperative images of 05/09/2016 FINDINGS: Components of a LEFT hip prosthesis are identified in expected positions. Bones appear demineralized. No acute fracture, dislocation, or bone destruction. Scattered soft tissue gas from surgery. SI joints and RIGHT hip joint space appear preserved. IMPRESSION: LEFT hip prosthesis without acute complication. Electronically Signed   By: Lavonia Dana M.D.   On: 05/09/2016 17:21   Dg Hip Operative Unilat W Or W/o Pelvis  Left  Result Date: 05/09/2016 CLINICAL DATA:  Arthroplasty EXAM: OPERATIVE LEFT HIP (WITH PELVIS IF PERFORMED) single VIEWS TECHNIQUE: Fluoroscopic spot image(s) were submitted for interpretation post-operatively. COMPARISON:  None. FINDINGS: LEFT hip total arthroplasty.  No fracture or dislocation IMPRESSION: No complication following LEFT hip arthroplasty. Electronically Signed   By: Suzy Bouchard M.D.   On: 05/09/2016 17:19   Dg Hip Unilat With Pelvis 2-3 Views Left  Result Date: 05/08/2016 CLINICAL DATA:  71 year old female tripped and fell on exercise machine. Initial encounter. EXAM: DG HIP (WITH OR WITHOUT PELVIS) 2-3V LEFT COMPARISON:  None. FINDINGS: There is a comminuted fracture of the left femoral neck with varus impaction. The left proximal femur intertrochanteric segment appears to remain intact. The left femoral head remains normally located at the left acetabulum. The right femoral head is normally located. The pelvis appears intact. SI joints within normal limits. Negative visible bowel gas pattern. IMPRESSION: Acute left femoral neck fracture appears comminuted with varus impaction. The intertrochanteric segment appears intact, with no other acute fracture or dislocation identified about the pelvis. Electronically Signed   By: Genevie Ann M.D.   On: 05/08/2016 16:10    Lab Data:  CBC:  Recent Labs Lab 05/08/16 1650 05/09/16 0523 05/10/16 0522  WBC 16.6* 12.0* 9.5  NEUTROABS 14.9*  --   --   HGB 14.1 13.8 10.9*  HCT 42.4 41.9 33.6*  MCV 91.4 92.1 92.6  PLT 315 323 [id number]   Basic Metabolic Panel:  Recent Labs Lab 05/08/16 1650 05/09/16 0523 05/10/16 0522  NA 133* 136 134*  K 3.6 3.9 3.5  CL 99* 98* 99*  CO2 24 28 24   GLUCOSE 129* 117* 110*  BUN 11 17 14   CREATININE 0.94 1.40* 1.12*  CALCIUM 9.1 9.3 8.0*   GFR: Estimated Creatinine Clearance: 39.8 mL/min (by C-G formula based on SCr of 1.12 mg/dL (H)). Liver Function Tests:  Recent Labs Lab 05/08/16 1650  05/09/16 0523  AST 21 23  ALT 20 18  ALKPHOS 65 63  BILITOT 0.6 0.7  PROT 6.6 6.0*  ALBUMIN 4.0 3.7    Recent Labs Lab 05/08/16 1650  LIPASE 29   No results for input(s): AMMONIA in the last 168 hours. Coagulation Profile:  Recent Labs Lab 05/08/16 1650 05/09/16 0523  INR 1.03 1.05   Cardiac Enzymes: No results for input(s): CKTOTAL, CKMB, CKMBINDEX, TROPONINI in the last 168 hours. BNP (last 3 results) No results for input(s): PROBNP in the last 8760 hours. HbA1C: No results for input(s): HGBA1C in the last 72 hours. CBG: No results for input(s): GLUCAP in the last 168 hours. Lipid Profile: No results for input(s): CHOL, HDL, LDLCALC, TRIG, CHOLHDL, LDLDIRECT in the last 72 hours. Thyroid Function Tests: No results for input(s): TSH, T4TOTAL, FREET4, T3FREE, THYROIDAB in the last 72 hours. Anemia Panel: No results for input(s): VITAMINB12, FOLATE, FERRITIN, TIBC, IRON, RETICCTPCT in the last 72 hours. Urine analysis:    Component Value Date/Time   COLORURINE YELLOW 05/08/2016 0022   APPEARANCEUR CLEAR 05/08/2016 0022   LABSPEC 1.009 05/08/2016 0022   PHURINE 8.0 05/08/2016 0022   GLUCOSEU NEGATIVE 05/08/2016 0022   HGBUR NEGATIVE 05/08/2016 0022   BILIRUBINUR NEGATIVE 05/08/2016 0022   KETONESUR NEGATIVE 05/08/2016 0022   PROTEINUR NEGATIVE 05/08/2016 0022   NITRITE NEGATIVE 05/08/2016 0022   LEUKOCYTESUR NEGATIVE 05/08/2016 0022     Annamae Shivley M.D. [facility] 05/10/2016, 11:52 AM  Pager: [phone] Between 7am to 7pm - call Pager - [phone]  After 7pm go to [email or web address] - password [email or web address]  Call night coverage person covering after 7pm

## 2016-05-10 NOTE — Progress Notes (Signed)
Physical Therapy Evaluation:  Clinical Impression: Pt is POD 1 following the below procedure. Prior to admission, pt was completely independent with all ADL's and IADLs, was going to the gym and living alone in a single level home. Pt will have family assistance available PRN when she does return home and will benefit from HHPT when she goes home. Pt will require further acute PT services to address the below deficits prior to discharge home.    05/10/16 1256  PT Visit Information  Last PT Received On 05/10/16  Assistance Needed +1  History of Present Illness Pt is a 71 yo female admitted on 05/08/16 following a fall resulting from tripping over gym equiment. Pt sustained a left hip fx and underwent a THA on 05/09/16. PMH significant for COPD, GERD, HLD, HTN, Osteopenia, Hypothryoid.   Precautions  Precautions None  Precaution Comments direct anterior  Restrictions  Weight Bearing Restrictions Yes  LLE Weight Bearing WBAT  Home Living  Family/patient expects to be discharged to: Private residence  Living Arrangements Alone  Available Help at Discharge Family;Other (Comment) (reports she will have to talk to her son)  Type of Home House  Prior Function  Level of Independence Independent  Comments was living alone prior to admission  Communication  Communication No difficulties  Pain Assessment  Pain Assessment 0-10  Pain Score 6  Pain Location left hip with mobility  Pain Descriptors / Indicators Aching;Grimacing;Guarding;Moaning  Pain Intervention(s) Limited activity within patient's tolerance;Monitored during session;Premedicated before session;Ice applied  Cognition  Arousal/Alertness Awake/alert  Behavior During Therapy WFL for tasks assessed/performed  Overall Cognitive Status Within Functional Limits for tasks assessed  Upper Extremity Assessment  Upper Extremity Assessment Defer to OT evaluation  Lower Extremity Assessment  Lower Extremity Assessment LLE deficits/detail  LLE  Deficits / Details pt with normal post op pain and weakness. At least 3/5 ankle and 2/5 knee and hip per gross functional assessment  Cervical / Trunk Assessment  Cervical / Trunk Assessment Normal  Bed Mobility  Overal bed mobility Needs Assistance  Bed Mobility Supine to Sit  Supine to sit Min guard  General bed mobility comments Min guard for safety from EOB  Transfers  Overall transfer level Needs assistance  Equipment used Rolling walker (2 wheeled)  Transfers Sit to/from Stand  Sit to Stand Min guard  General transfer comment Min guard for safety from EOB and commode  Ambulation/Gait  Ambulation/Gait assistance Min guard  Ambulation Distance (Feet) 40 Feet  Assistive device Rolling walker (2 wheeled)  Gait Pattern/deviations Step-to pattern;Decreased step length - right;Decreased stance time - left;Antalgic  General Gait Details Moderate antalgic gait, cues for sequencing and staying within walker  Gait velocity decreased  Gait velocity interpretation Below normal speed for age/gender  Balance  Overall balance assessment Needs assistance  Sitting-balance support No upper extremity supported  Sitting balance-Leahy Scale Good  Sitting balance - Comments able to sit EOB without assistance  Standing balance support Bilateral upper extremity supported;No upper extremity supported;During functional activity  Standing balance-Leahy Scale Fair  Standing balance comment able to stand briefly without holding onto RW to wash hands  General Comments  General comments (skin integrity, edema, etc.) Pt is impulsive and has been getting up without assistance. Advised pt to wait for NT to get to bathroom and to give them notice before trying to ambulate to bathroom.   PT - End of Session  Equipment Utilized During Treatment Gait belt  Activity Tolerance Patient limited by pain  Patient left  in chair;with call bell/phone within reach  Nurse Communication Mobility status  PT Assessment  PT  Recommendation/Assessment Patient needs continued PT services  PT Problem List Decreased strength;Decreased range of motion;Decreased activity tolerance;Decreased balance;Decreased mobility;Decreased coordination;Decreased knowledge of use of DME;Pain  PT Plan  PT Frequency (ACUTE ONLY) Min 5X/week  PT Treatment/Interventions (ACUTE ONLY) DME instruction;Gait training;Stair training;Functional mobility training;Therapeutic activities;Therapeutic exercise;Balance training;Patient/family education  PT Recommendation  Follow Up Recommendations Home health PT;Supervision for mobility/OOB  PT equipment Rolling walker with 5" wheels;3in1 (PT)  Individuals Consulted  Consulted and Agree with Results and Recommendations Patient  Acute Rehab PT Goals  Patient Stated Goal to go home and not rehab  PT Goal Formulation With patient  Time For Goal Achievement 05/17/16  Potential to Achieve Goals Good  PT Time Calculation  PT Start Time (ACUTE ONLY) 1102  PT Stop Time (ACUTE ONLY) 1114  PT Time Calculation (min) (ACUTE ONLY) 12 min  PT General Charges  $$ ACUTE PT VISIT 1 Procedure  PT Evaluation  $PT Eval Moderate Complexity 1 Procedure  Written Expression  Dominant Hand Right  Scheryl Marten PT, DPT  [phone]

## 2016-05-10 NOTE — Evaluation (Signed)
Occupational Therapy Evaluation Patient Details Name: Linda [name] MRN: [id number] DOB: 1945/08/02 Today's Date: 05/10/2016    History of Present Illness Pt is a 71 yo female admitted on 05/08/16 following a fall resulting from tripping over gym equiment. Pt sustained a left hip fx and underwent a THA on 05/09/16. PMH significant for COPD, GERD, HLD, HTN, Osteopenia, Hypothryoid.    Clinical Impression   PTA Pt independent in ADL and mobility. Pt currently mod A for ADL and min guard for mobility with RW. Pt benefits from cues for safety with RW. Pt will benefit from skilled OT in the acute care setting prior to dc to maximize safety and independence in ADL. Next session to focus on AE education. Pt requires HHOT to return to PLOF.    Follow Up Recommendations  Home health OT;Supervision - Intermittent    Equipment Recommendations  3 in 1 bedside commode;Other (comment) (AE kit)    Recommendations for Other Services       Precautions / Restrictions Precautions Precautions: None Precaution Comments: direct anterior Restrictions Weight Bearing Restrictions: Yes LLE Weight Bearing: Weight bearing as tolerated      Mobility Bed Mobility Overal bed mobility: Needs Assistance Bed Mobility: Supine to Sit;Sit to Supine     Supine to sit: Supervision Sit to supine: Min assist   General bed mobility comments: min A fro BLE and pain management back into bed   Transfers Overall transfer level: Needs assistance Equipment used: Rolling walker (2 wheeled) Transfers: Sit to/from Stand Sit to Stand: Min guard         General transfer comment: vc for safe hand placement    Balance Overall balance assessment: Needs assistance Sitting-balance support: No upper extremity supported Sitting balance-Leahy Scale: Good Sitting balance - Comments: able to sit EOB without assistance   Standing balance support: Bilateral upper extremity supported;No upper extremity supported;During  functional activity Standing balance-Leahy Scale: Fair Standing balance comment: able to perform sink level grooming                            ADL Overall ADL's : Needs assistance/impaired Eating/Feeding: Modified independent;Sitting   Grooming: Wash/dry hands;Supervision/safety;Cueing for safety;Standing Grooming Details (indicate cue type and reason): sink level, cues for safety with walker Upper Body Bathing: Supervision/ safety;Sitting   Lower Body Bathing: Moderate assistance;With caregiver independent assisting;With adaptive equipment;Sitting/lateral leans   Upper Body Dressing : Modified independent;Sitting   Lower Body Dressing: Moderate assistance;With caregiver independent assisting;With adaptive equipment;Cueing for compensatory techniques;Sit to/from stand   Toilet Transfer: Supervision/safety;Ambulation;Comfort height toilet;RW;Grab bars Toilet Transfer Details (indicate cue type and reason): vc for safety with RW and proximity to toilet Toileting- Clothing Manipulation and Hygiene: Supervision/safety;Sit to/from stand Toileting - Clothing Manipulation Details (indicate cue type and reason): managed hospital gown and peri care     Functional mobility during ADLs: Min guard;Rolling walker General ADL Comments: Pt used to be independent and requires vc for safety with RW     Vision Vision Assessment?: No apparent visual deficits   Perception     Praxis      Pertinent Vitals/Pain Pain Assessment: 0-10 Pain Score: 7  (3 when at rest) Pain Location: left hip with mobility Pain Descriptors / Indicators: Aching;Grimacing;Guarding;Moaning Pain Intervention(s): Monitored during session;Repositioned;Ice applied;Premedicated before session     Hand Dominance Right   Extremity/Trunk Assessment Upper Extremity Assessment Upper Extremity Assessment: Overall WFL for tasks assessed   Lower Extremity Assessment Lower Extremity Assessment:  LLE  deficits/detail LLE Deficits / Details: pt with normal post op pain and weakness   Cervical / Trunk Assessment Cervical / Trunk Assessment: Normal   Communication Communication Communication: No difficulties   Cognition Arousal/Alertness: Awake/alert Behavior During Therapy: WFL for tasks assessed/performed;Impulsive Overall Cognitive Status: Within Functional Limits for tasks assessed                     General Comments       Exercises       Shoulder Instructions      Home Living Family/patient expects to be discharged to:: Private residence Living Arrangements: Alone Available Help at Discharge: Family;Other (Comment);Available PRN/intermittently (church friends) Type of Home: House                                  Prior Functioning/Environment Level of Independence: Independent        Comments: was living alone prior to admission        OT Problem List: Decreased strength;Decreased range of motion;Decreased activity tolerance;Impaired balance (sitting and/or standing);Decreased knowledge of use of DME or AE;Decreased knowledge of precautions;Decreased safety awareness;Pain   OT Treatment/Interventions: Self-care/ADL training;DME and/or AE instruction;Therapeutic activities;Patient/family education;Balance training    OT Goals(Current goals can be found in the care plan section) Acute Rehab OT Goals Patient Stated Goal: to go home and not rehab OT Goal Formulation: With patient Time For Goal Achievement: 05/24/16 Potential to Achieve Goals: Good ADL Goals Pt Will Perform Lower Body Bathing: with modified independence;with adaptive equipment;sitting/lateral leans Pt Will Perform Lower Body Dressing: with modified independence;with adaptive equipment;sit to/from stand;with caregiver independent in assisting Pt Will Transfer to Toilet: with modified independence;ambulating;bedside commode (with RW) Pt Will Perform Toileting - Clothing  Manipulation and hygiene: with modified independence;sit to/from stand  OT Frequency: Min 2X/week   Barriers to D/C: Decreased caregiver support  Pt lives alone, but has family close by - but they work       Co-evaluation              End of Session Equipment Utilized During Treatment: Administrator, arts Communication: Mobility status;Weight bearing status  Activity Tolerance: Patient tolerated treatment well Patient left: in bed;with call bell/phone within reach;with family/visitor present   Time: 2446-2863 OT Time Calculation (min): 23 min Charges:  OT General Charges $OT Visit: 1 Procedure OT Evaluation $OT Eval Moderate Complexity: 1 Procedure OT Treatments $Self Care/Home Management : 8-22 mins G-Codes:    Merri Ray Choua Ikner May 25, 2016, 4:37 PM  Hulda Humphrey OTR/L [phone]

## 2016-05-10 NOTE — Progress Notes (Signed)
Subjective: 1 Day Post-Op Procedure(s) (LRB): TOTAL HIP ARTHROPLASTY ANTERIOR APPROACH (Left) Patient reports pain as moderate.  Reports incisional soreness this AM. Nausea improved this AM, appetite to eat a normal breakfast. No numbness or tingling. No other c/o.  Objective: Vital signs in last 24 hours: Temp:  [97.7 F (36.5 C)-100 F (37.8 C)] 98.7 F (37.1 C) (02/10 0652) Pulse Rate:  [66-96] 86 (02/10 0652) Resp:  [15-21] 18 (02/09 1810) BP: (124-163)/(64-87) 145/64 (02/10 0652) SpO2:  [90 %-99 %] 91 % (02/10 0652) Weight:  [57.6 kg (127 lb)] 57.6 kg (127 lb) (02/09 1503)  Intake/Output from previous day: 02/09 0701 - 02/10 0700 In: 2615.5 [P.O.:240; I.V.:2275.5; IV Piggyback:100] Out: 2025 [Urine:1725; Blood:300] Intake/Output this shift: No intake/output data recorded.   Recent Labs  05/08/16 1650 05/09/16 0523 05/10/16 0522  HGB 14.1 13.8 10.9*    Recent Labs  05/09/16 0523 05/10/16 0522  WBC 12.0* 9.5  RBC 4.55 3.63*  HCT 41.9 33.6*  PLT 323 210    Recent Labs  05/09/16 0523 05/10/16 0522  NA 136 134*  K 3.9 3.5  CL 98* 99*  CO2 28 24  BUN 17 14  CREATININE 1.40* 1.12*  GLUCOSE 117* 110*  CALCIUM 9.3 8.0*    Recent Labs  05/08/16 1650 05/09/16 0523  INR 1.03 1.05    Neurologically intact ABD soft Neurovascular intact Sensation intact distally Intact pulses distally Dorsiflexion/Plantar flexion intact Incision: dressing C/D/I and no drainage No cellulitis present Compartment soft no sign of DVT  Assessment/Plan: 1 Day Post-Op Procedure(s) (LRB): TOTAL HIP ARTHROPLASTY ANTERIOR APPROACH (Left) Advance diet Up with therapy D/C IV fluids   Lehua Flores M. 05/10/2016, 8:33 AM

## 2016-05-11 DIAGNOSIS — S72002A Fracture of unspecified part of neck of left femur, initial encounter for closed fracture: Principal | ICD-10-CM

## 2016-05-11 LAB — BASIC METABOLIC PANEL
Anion gap: 9 (ref 5–15)
BUN: 15 mg/dL (ref 6–20)
CALCIUM: 8.2 mg/dL — AB (ref 8.9–10.3)
CO2: 24 mmol/L (ref 22–32)
CREATININE: 1.13 mg/dL — AB (ref 0.44–1.00)
Chloride: 99 mmol/L — ABNORMAL LOW (ref 101–111)
GFR calc Af Amer: 55 mL/min — ABNORMAL LOW (ref >60)
GFR calc non Af Amer: 48 mL/min — ABNORMAL LOW (ref >60)
Glucose, Bld: 125 mg/dL — ABNORMAL HIGH (ref 65–99)
Potassium: 3.5 mmol/L (ref 3.5–5.1)
Sodium: 132 mmol/L — ABNORMAL LOW (ref 135–145)

## 2016-05-11 LAB — CBC
HCT: 32.6 % — ABNORMAL LOW (ref 36.0–46.0)
Hemoglobin: 10.7 g/dL — ABNORMAL LOW (ref 12.0–15.0)
MCH: 30 pg (ref 26.0–34.0)
MCHC: 32.8 g/dL (ref 30.0–36.0)
MCV: 91.3 fL (ref 78.0–100.0)
Platelets: 211 10*3/uL (ref 150–400)
RBC: 3.57 MIL/uL — ABNORMAL LOW (ref 3.87–5.11)
RDW: 13.1 % (ref 11.5–15.5)
WBC: 9.5 10*3/uL (ref 4.0–10.5)

## 2016-05-11 NOTE — Progress Notes (Signed)
[facility] Progress Note  Patient: Linda [name] [id number]   PCP: Ernestene Kiel, MD DOB: June 07, 1945   DOA: 05/08/2016   DOS: 05/11/2016   Date of Service: the patient was seen and examined on 05/11/2016   Subjective: Concerned regarding her discharge, also continues about pain at the joint. No nausea no vomiting. No family support at home.  Brief hospital course: MARTIKA SANGIACOMO a 71 y.o.femaleh/o HTN, HLD, hypothyroidism, gerd, osteopenia, Prior smoker, h/o copd has been stable, no recent flare, not on home o2, not steroids dependent, per patient she has been having an active life and goes to the gym regularly. Today, she tripped and fell at [facility], report severe left hip pain, and left leg shortening, she is brought to the ED by EMS, hip x ray confirmed "Acute left femoral neck fracture appears comminuted with varus impaction. The intertrochanteric segment appears intact, with no other acute fracture or dislocation identified about the pelvis." , orthopedic Dr Frankey Shown consulted, Patient underwent hip surgery on 05/09/16  Assessment and Plan: Left hip fracture  from mechanical fall: status post left total hip replacement -  continue pain control, worked with PT today, lives alone, will likely need short-term rehabilitation - Continue pain control, bowel regimen  Active problems  Leukocytosis: - likely from stress demargination, resolved - no sign of infection, no fever, cxr no infiltrates - UA negative  Mild hyponatremia: - from dehydration, she also has significant amount of pain -  gentle hydration, pain control.  Copd:  -  is currently stable, no wheezing, continue Spiriva, dulera, albuterol nebs   HTN - Currently stable, continue norvasc, losartan, prn hydralazine  HLD - continue statin  Hypothyroidism: - continue synthroid   Bowel regimen: last BM 02/0/2018 Diet: cardiac diet DVT Prophylaxis: subcutaneous Heparin  Advance goals of  care discussion: full code  Family Communication: no family was present at bedside, at the time of interview.   Disposition:  Discharge to SNF. Expected discharge date: 05/12/2016,   Consultants: orthopedics Procedures: S/P orif  Antibiotics: Anti-infectives    Start     Dose/Rate Route Frequency Ordered Stop   05/09/16 2000  ceFAZolin (ANCEF) IVPB 2g/100 mL premix     2 g 200 mL/hr over 30 Minutes Intravenous Every 6 hours 05/09/16 1849 05/10/16 0344   05/09/16 1345  ceFAZolin (ANCEF) IVPB 2g/100 mL premix     2 g 200 mL/hr over 30 Minutes Intravenous To ShortStay Surgical 05/09/16 1340 05/09/16 1541        Objective: Physical Exam: Vitals:   05/10/16 1016 05/10/16 1423 05/10/16 2136 05/11/16 0532  BP:  132/62 (!) 142/60 132/62  Pulse:  93 86 93  Resp:  17 16 16   Temp:  98.9 F (37.2 C) 99.9 F (37.7 C) (!) 100.4 F (38 C)  TempSrc:  Oral Oral Oral  SpO2: 90% 95% 90% 90%  Weight:      Height:        Intake/Output Summary (Last 24 hours) at 05/11/16 1554 Last data filed at 05/11/16 1500  Gross per 24 hour  Intake              920 ml  Output                0 ml  Net              920 ml   Filed Weights   05/09/16 1503  Weight: 57.6 kg (127 lb)    General: Alert, Awake and  Oriented to Time, Place and Person. Appear in mild distress, affect appropriate Eyes: PERRL, Conjunctiva normal ENT: Oral Mucosa clear oist. Neck: no JVD, no Abnormal Mass Or lumps Cardiovascular: S1 and S2 Present, no Murmur, Respiratory: Bilateral Air entry equal and Decreased, no use of accessory muscle, Clear to Auscultation, no Crackles, no wheezes Abdomen: Bowel Sound present, Soft and no tenderness Skin: no redness, no Rash, no induration Extremities: no Pedal edema, no calf tenderness Neurologic: Grossly no focal neuro deficit. Bilaterally Equal motor strength  Data Reviewed: CBC:  Recent Labs Lab 05/08/16 1650 05/09/16 0523 05/10/16 0522 05/11/16 0248  WBC 16.6* 12.0*  9.5 9.5  NEUTROABS 14.9*  --   --   --   HGB 14.1 13.8 10.9* 10.7*  HCT 42.4 41.9 33.6* 32.6*  MCV 91.4 92.1 92.6 91.3  PLT 315 323 210 [id number]   Basic Metabolic Panel:  Recent Labs Lab 05/08/16 1650 05/09/16 0523 05/10/16 0522 05/11/16 0248  NA 133* 136 134* 132*  K 3.6 3.9 3.5 3.5  CL 99* 98* 99* 99*  CO2 24 28 24 24   GLUCOSE 129* 117* 110* 125*  BUN 11 17 14 15   CREATININE 0.94 1.40* 1.12* 1.13*  CALCIUM 9.1 9.3 8.0* 8.2*    Liver Function Tests:  Recent Labs Lab 05/08/16 1650 05/09/16 0523  AST 21 23  ALT 20 18  ALKPHOS 65 63  BILITOT 0.6 0.7  PROT 6.6 6.0*  ALBUMIN 4.0 3.7    Recent Labs Lab 05/08/16 1650  LIPASE 29   No results for input(s): AMMONIA in the last 168 hours. Coagulation Profile:  Recent Labs Lab 05/08/16 1650 05/09/16 0523  INR 1.03 1.05   Cardiac Enzymes: No results for input(s): CKTOTAL, CKMB, CKMBINDEX, TROPONINI in the last 168 hours. BNP (last 3 results) No results for input(s): PROBNP in the last 8760 hours.  CBG: No results for input(s): GLUCAP in the last 168 hours.  Studies: No results found.   Scheduled Meds: . amLODipine  5 mg Oral Daily  . enoxaparin (LOVENOX) injection  40 mg Subcutaneous Q24H  . feeding supplement (ENSURE ENLIVE)  237 mL Oral BID BM  . ferrous sulfate  325 mg Oral TID PC  . levothyroxine  88 mcg Oral Daily  . losartan  100 mg Oral Daily  . mometasone-formoterol  2 puff Inhalation BID  . pantoprazole  40 mg Oral Daily  . pravastatin  20 mg Oral Daily  . roflumilast  500 mcg Oral Daily  . scopolamine  1 patch Transdermal Q72H  . tiotropium  18 mcg Inhalation Daily   Continuous Infusions: PRN Meds: albuterol, calcium carbonate, diphenhydrAMINE, hydrALAZINE, HYDROcodone-acetaminophen, menthol-cetylpyridinium **OR** phenol, methocarbamol, metoCLOPramide **OR** metoCLOPramide (REGLAN) injection, morphine injection, ondansetron (ZOFRAN) IV  Time spent: 30 minutes  Author: Berle Mull,  MD [facility] Pager: [phone] 05/11/2016 3:54 PM  If 7PM-7AM, please contact night-coverage at [email or web address], password [facility]

## 2016-05-11 NOTE — Care Management Note (Signed)
Case Management Note  Patient Details  Name: Linda [name] MRN: [id number] Date of Birth: 21-Jul-1945  Subjective/Objective:                  Left hip fracture from mechanical fall Action/Plan: Discharge planning Expected Discharge Date:                  Expected Discharge Plan:  [place]  In-House Referral:     Discharge planning Services  CM Consult  Post Acute Care Choice:  Home Health Choice offered to:  Patient  DME Arranged:  3-N-1, Walker rolling DME Agency:  [place]:  PT, Nurse's Aide [place] Agency:  [facility]  Status of Service:  In process, will continue to follow  If discussed at Long Length of Stay Meetings, dates discussed:    Additional Comments: Cm met with pt who chooses [facility] to render HHPT/aide.  Referral called to [facility].  Cm notiifed [facility] DME rep, to please deliver the rolling walker and 3n1 to room prior to discharge.  CM has requested face to face and HHPT/aide orders.  Waiting for orders. Dellie Catholic, RN 05/11/2016, 4:39 PM

## 2016-05-11 NOTE — Progress Notes (Signed)
Physical Therapy Treatment Patient Details Name: Linda [name] MRN: [id number] DOB: 04/05/1945 Today's Date: 05/11/2016    History of Present Illness Pt is a 71 yo female admitted on 05/08/16 following a fall resulting from tripping over gym equiment. Pt sustained a left hip fx and underwent a THA on 05/09/16. PMH significant for COPD, GERD, HLD, HTN, Osteopenia, Hypothryoid.     PT Comments    Pt improved with gait distance today, limited by shortness of breath (h/o COPD). Pt's son in room on arrival and reports mom "exaggerated" the amount of help she will have at home,. Both reported she will be alone with people checking in at times, not every day. Son wants mom to get rehab before going home alone due to this and pt has agreed. Both requesting to meet with social worker now. Discharge recommendations updated to reflect this. Pt continues to need cues for overall safety as well. Pt asking "this means I can get up when I want and do this on my own, right?" when walking in hallway. Reinforced to pt to call for help any time she needs to get up and wait for the assistance before getting up. Pt verbalized understanding. Acute PT to continue during pt's hospital stay.    Follow Up Recommendations  Supervision - Intermittent;SNF (due to no caregiver support at home on discharge)     Equipment Recommendations  Rolling walker with 5" wheels;3in1 (PT)    Precautions / Restrictions Precautions Precautions: None Precaution Comments: direct anterior; h/o fall which caused this Restrictions LLE Weight Bearing: Weight bearing as tolerated    Mobility  Bed Mobility               General bed mobility comments: pt seated EOB on arrival to room  Transfers Overall transfer level: Needs assistance   Transfers: Sit to/from Stand Sit to Stand: Supervision         General transfer comment: cues for hand placement, to steady self on standing before initiating gait and to turn completely to  surface before sitting down (vs swinging hips around )  Ambulation/Gait Ambulation/Gait assistance: Min assist;Min guard Ambulation Distance (Feet): 120 Feet Assistive device: Rolling walker (2 wheeled) Gait Pattern/deviations: Step-to pattern;Step-through pattern;Decreased step length - left;Decreased step length - right;Decreased stance time - left;Antalgic;Narrow base of support;Trunk flexed Gait velocity: decreased Gait velocity interpretation: Below normal speed for age/gender General Gait Details: cues for step length, posture, and walker safety. cues to stay closer to walker, to keep walker on ground with 180 degree turns and to not let go of walker with mobility to hold onto other things (rails, furniture, etc.)       Cognition Arousal/Alertness: Awake/alert Behavior During Therapy: Impulsive;WFL for tasks assessed/performed Overall Cognitive Status: Within Functional Limits for tasks assessed         Exercises Total Joint Exercises Quad Sets: AROM;Strengthening;Left;10 reps;Supine;Limitations Quad Sets Limitations: in recliner Heel Slides: AAROM;Strengthening;Left;10 reps;Supine;Limitations Heel Slides Limitations: min AA in recliner Straight Leg Raises: AAROM;Strengthening;Left;10 reps;Supine;Limitations Straight Leg Raises Limitations: min AA in recliner     Pertinent Vitals/Pain Pain Assessment: 0-10 Pain Score: 6  Pain Location: left hip with mobility Pain Descriptors / Indicators: Aching;Discomfort;Sore;Operative site guarding;Grimacing Pain Intervention(s): Limited activity within patient's tolerance;Monitored during session;Repositioned;Patient requesting pain meds-RN notified;Ice applied     PT Goals (current goals can now be found in the care plan section) Acute Rehab PT Goals Patient Stated Goal: to go home and not rehab PT Goal Formulation: With patient Time For Goal  Achievement: 05/17/16 Potential to Achieve Goals: Good Progress towards PT goals:  Progressing toward goals    Frequency    Min 5X/week      PT Plan Discharge plan needs to be updated    End of Session Equipment Utilized During Treatment: Gait belt Activity Tolerance: Patient tolerated treatment well;Patient limited by pain;Patient limited by fatigue;Other (comment) (shortness of breath with gait) Patient left: in chair;with call bell/phone within reach     Time: 1135-1158 PT Time Calculation (min) (ACUTE ONLY): 23 min  Charges:  $Gait Training: 8-22 mins $Therapeutic Exercise: 8-22 mins           Willow Ora 05/11/2016, 12:07 PM   Willow Ora, PTA, [phone] 05/11/16, 12:11 PM

## 2016-05-11 NOTE — Progress Notes (Signed)
Physical Therapy Treatment Patient Details Name: JAELLA STAMER MRN: [id number] DOB: [birth year] Today's Date: 05/11/2016    History of Present Illness Pt is a 71 yo female admitted on 05/08/16 following a fall resulting from tripping over gym equiment. Pt sustained a left hip fx and underwent a THA on 05/09/16. PMH significant for COPD, GERD, HLD, HTN, Osteopenia, Hypothryoid.     PT Comments    Pt reported she had just got back in bed, agreeable to bed exercises. Acute PT to continue during pt's hospital stay.  Follow Up Recommendations  SNF;Supervision for mobility/OOB     Equipment Recommendations  Rolling walker with 5" wheels;3in1 (PT)    Precautions / Restrictions Precautions Precautions: None Precaution Comments: direct anterior; h/o fall which caused this Restrictions LLE Weight Bearing: Weight bearing as tolerated       Cognition Arousal/Alertness: Awake/alert Behavior During Therapy: WFL for tasks assessed/performed;Impulsive Overall Cognitive Status: Within Functional Limits for tasks assessed           Exercises Total Joint Exercises Ankle Circles/Pumps: AROM;Both;10 reps;Supine Quad Sets: AROM;Strengthening;Left;10 reps;Supine Quad Sets Limitations: with 3 sec holds Short Arc Quad: AROM;Strengthening;Left;10 reps;Supine Heel Slides: AAROM;Strengthening;Left;10 reps;Supine Heel Slides Limitations: min AA Hip ABduction/ADduction: AAROM;Right;10 reps;Supine;Limitations Hip Abduction/Adduction Limitations: min AA Straight Leg Raises: AAROM;Left;10 reps;Supine;Limitations Straight Leg Raises Limitations: min AA      Pertinent Vitals/Pain Pain Assessment: 0-10 Pain Score: 5  Pain Location: left hip with mobility Pain Descriptors / Indicators: Aching;Discomfort;Sore;Operative site guarding;Grimacing Pain Intervention(s): Limited activity within patient's tolerance;Monitored during session;Repositioned (pt deferred offer to call RN for pain meds)     PT Goals  (current goals can now be found in the care plan section) Acute Rehab PT Goals Patient Stated Goal: to go home and not rehab PT Goal Formulation: With patient Time For Goal Achievement: 05/17/16 Potential to Achieve Goals: Good Progress towards PT goals: Progressing toward goals    Frequency    Min 5X/week      PT Plan Current plan remains appropriate    End of Session Equipment Utilized During Treatment: Gait belt Activity Tolerance: Patient tolerated treatment well;Patient limited by pain;Patient limited by fatigue Patient left: in bed;with call bell/phone within reach     Time: 1459-1512 PT Time Calculation (min) (ACUTE ONLY): 13 min  Charges:  $Gait Training: 8-22 mins $Therapeutic Exercise: 8-22 mins           Willow Ora 05/11/2016, 3:14 PM   Willow Ora, PTA, [phone] 05/11/16, 3:15 PM

## 2016-05-11 NOTE — Progress Notes (Signed)
    Subjective: Patient reports pain as mild to moderate.  No c/o.  Objective:   VITALS:   Vitals:   05/10/16 1016 05/10/16 1423 05/10/16 2136 05/11/16 0532  BP:  132/62 (!) 142/60 132/62  Pulse:  93 86 93  Resp:  17 16 16   Temp:  98.9 F (37.2 C) 99.9 F (37.7 C) (!) 100.4 F (38 C)  TempSrc:  Oral Oral Oral  SpO2: 90% 95% 90% 90%  Weight:      Height:        NAD ABD soft Sensation intact distally Intact pulses distally Dorsiflexion/Plantar flexion intact Incision: dressing C/D/I Compartment soft   Lab Results  Component Value Date   WBC 9.5 05/11/2016   HGB 10.7 (L) 05/11/2016   HCT 32.6 (L) 05/11/2016   MCV 91.3 05/11/2016   PLT 211 05/11/2016   BMET    Component Value Date/Time   NA 132 (L) 05/11/2016 0248   K 3.5 05/11/2016 0248   CL 99 (L) 05/11/2016 0248   CO2 24 05/11/2016 0248   GLUCOSE 125 (H) 05/11/2016 0248   BUN 15 05/11/2016 0248   CREATININE 1.13 (H) 05/11/2016 0248   CALCIUM 8.2 (L) 05/11/2016 0248   GFRNONAA 48 (L) 05/11/2016 0248   GFRAA 55 (L) 05/11/2016 0248     Assessment/Plan: 2 Days Post-Op   Active Problems:   COPD, group D, by GOLD 2017 classification ([place])   Closed left femoral fracture ([place])   Hypothyroidism   WBAT with walker Lovenox, SCDs, TEDs PT/OT PO pain control Temperature elevation: likely due to atelectasis, continue IS Dispo: D/C home with HHPT   Jasie Meleski, Horald Pollen 05/11/2016, 7:38 AM   Rod Can, MD Cell [phone]

## 2016-05-11 NOTE — Progress Notes (Signed)
Occupational Therapy Treatment Patient Details Name: Linda [name] MRN: [id number] DOB: [birth year] Today's Date: 05/11/2016    History of present illness Pt is a 71 yo female admitted on 05/08/16 following a fall resulting from tripping over gym equiment. Pt sustained a left hip fx and underwent a THA on 05/09/16. PMH significant for COPD, GERD, HLD, HTN, Osteopenia, Hypothryoid.    OT comments  Pt demonstrating decreased safety awareness this session, increased fatigue. Pt pleasant and willing to work with therapy. Session focused on AE education for LB bathing and dressing as well as toilet transfer and sink level grooming, please see performance level below. Pt lives alone and will have intermittent assist from her children, but due to decreased safety awareness is at high risk for fall. OT feels that Pt will require SNF level therapy upon dc to maximize safety and independence in ADL and functional mobility and return Pt to PLOF.   Follow Up Recommendations  SNF;Supervision/Assistance - 24 hour    Equipment Recommendations  3 in 1 bedside commode;Other (comment) (AE kit)    Recommendations for Other Services      Precautions / Restrictions Precautions Precautions: None Precaution Comments: direct anterior; h/o fall which caused this Restrictions Weight Bearing Restrictions: Yes LLE Weight Bearing: Weight bearing as tolerated       Mobility Bed Mobility Overal bed mobility: Needs Assistance Bed Mobility: Supine to Sit;Sit to Supine     Supine to sit: Min guard Sit to supine: Min guard   General bed mobility comments: Pt experiencing most pain during movement, good use of non-operated leg to assist  Transfers Overall transfer level: Needs assistance Equipment used: Rolling walker (2 wheeled) Transfers: Sit to/from Stand Sit to Stand: Min guard         General transfer comment: vc for hand placement    Balance Overall balance assessment: Needs  assistance Sitting-balance support: No upper extremity supported Sitting balance-Leahy Scale: Good Sitting balance - Comments: able to sit EOB without assistance   Standing balance support: Bilateral upper extremity supported;No upper extremity supported;During functional activity Standing balance-Leahy Scale: Fair Standing balance comment: able to perform sink level grooming                   ADL Overall ADL's : Needs assistance/impaired     Grooming: Min guard;Standing;Wash/dry hands Grooming Details (indicate cue type and reason): sink level, cues for safety with walker     Lower Body Bathing: Moderate assistance;With caregiver independent assisting;With adaptive equipment;Sitting/lateral leans Lower Body Bathing Details (indicate cue type and reason): Pt educated on long handle sponge, and importance of caregiver presence for safety since the bathroom is such a high fall risk area Upper Body Dressing : Modified independent;Sitting   Lower Body Dressing: Moderate assistance;With caregiver independent assisting;With adaptive equipment;Cueing for compensatory techniques;Sit to/from stand Lower Body Dressing Details (indicate cue type and reason): Pt practiced today with AE kit and needed mod cues for sequencing (grabber/reacher, sock donner) and demonstration with shoe horn Toilet Transfer: Minimal assistance;Ambulation;Cueing for safety;Comfort height toilet;RW Toilet Transfer Details (indicate cue type and reason): MAX vc for safety with RW and positioning before starting sit <> stand. Pt with decreased safety awareness this session Toileting- Clothing Manipulation and Hygiene: Moderate assistance;Sit to/from stand Toileting - Clothing Manipulation Details (indicate cue type and reason): "can you untie this for me" able to manage peri care Tub/ Shower Transfer: Walk-in shower;Minimal assistance;Cueing for safety;Rolling walker;Ambulation Tub/Shower Transfer Details (indicate cue  type and reason): re-enforced importance  of having caregiver for safety Functional mobility during ADLs: Min guard;Rolling walker General ADL Comments: Pt stated that she has been up around her room without her RW and has decreased safety awareness this session. Pt also demonstrating fatigue with heavy breating after trip to bathroom      Vision                     Perception     Praxis      Cognition   Behavior During Therapy: [facility] for tasks assessed/performed;Impulsive Overall Cognitive Status: Within Functional Limits for tasks assessed                  General Comments: Impulsivity impacting safety this session    Extremity/Trunk Assessment               Exercises Total Joint Exercises Ankle Circles/Pumps: AROM;Both;10 reps;Supine Quad Sets: AROM;Strengthening;Left;10 reps;Supine Quad Sets Limitations: with 3 sec holds Short Arc Quad: AROM;Strengthening;Left;10 reps;Supine Heel Slides: AAROM;Strengthening;Left;10 reps;Supine Heel Slides Limitations: min AA Hip ABduction/ADduction: AAROM;Right;10 reps;Supine;Limitations Hip Abduction/Adduction Limitations: min AA Straight Leg Raises: AAROM;Left;10 reps;Supine;Limitations Straight Leg Raises Limitations: min AA    Shoulder Instructions       General Comments      Pertinent Vitals/ Pain       Pain Assessment: 0-10 Pain Score: 6  Pain Location: left hip with mobility Pain Descriptors / Indicators: Aching;Discomfort;Sore;Operative site guarding;Grimacing Pain Intervention(s): Monitored during session;Repositioned;Ice applied;Patient requesting pain meds-RN notified  Home Living                                          Prior Functioning/Environment              Frequency  Min 2X/week        Progress Toward Goals  OT Goals(current goals can now be found in the care plan section)  Progress towards OT goals: Not progressing toward goals - comment (Pt with decrease  safety, and increase fatigue this session)  Acute Rehab OT Goals Patient Stated Goal: to go home and not rehab OT Goal Formulation: With patient Time For Goal Achievement: 05/24/16 Potential to Achieve Goals: [place] Discharge plan needs to be updated    Co-evaluation                 End of Session Equipment Utilized During Treatment: Gait belt;Rolling walker   Activity Tolerance Patient limited by fatigue   Patient Left in bed;with call bell/phone within reach   Nurse Communication Mobility status;Patient requests pain meds;Weight bearing status        Time: 1557-1630 OT Time Calculation (min): 33 min  Charges: OT General Charges $OT Visit: 1 Procedure OT Treatments $Self Care/Home Management : 23-37 mins  Jaci Carrel 05/11/2016, 4:42 PM Hulda Humphrey OTR/L [phone]

## 2016-05-12 ENCOUNTER — Other Ambulatory Visit: Payer: PPO

## 2016-05-12 ENCOUNTER — Encounter (HOSPITAL_COMMUNITY): Payer: Self-pay | Admitting: Orthopedic Surgery

## 2016-05-12 LAB — BASIC METABOLIC PANEL
Anion gap: 8 (ref 5–15)
BUN: 14 mg/dL (ref 6–20)
CHLORIDE: 100 mmol/L — AB (ref 101–111)
CO2: 29 mmol/L (ref 22–32)
Calcium: 8.5 mg/dL — ABNORMAL LOW (ref 8.9–10.3)
Creatinine, Ser: 1.1 mg/dL — ABNORMAL HIGH (ref 0.44–1.00)
GFR calc Af Amer: 57 mL/min — ABNORMAL LOW (ref >60)
GFR calc non Af Amer: 49 mL/min — ABNORMAL LOW (ref >60)
Glucose, Bld: 104 mg/dL — ABNORMAL HIGH (ref 65–99)
POTASSIUM: 3.5 mmol/L (ref 3.5–5.1)
SODIUM: 137 mmol/L (ref 135–145)

## 2016-05-12 LAB — CBC
HCT: 31.7 % — ABNORMAL LOW (ref 36.0–46.0)
Hemoglobin: 10.2 g/dL — ABNORMAL LOW (ref 12.0–15.0)
MCH: 29.4 pg (ref 26.0–34.0)
MCHC: 32.2 g/dL (ref 30.0–36.0)
MCV: 91.4 fL (ref 78.0–100.0)
Platelets: 230 10*3/uL (ref 150–400)
RBC: 3.47 MIL/uL — AB (ref 3.87–5.11)
RDW: 13.2 % (ref 11.5–15.5)
WBC: 8.8 10*3/uL (ref 4.0–10.5)

## 2016-05-12 MED ORDER — ENSURE ENLIVE PO LIQD: 237.0000 mL | Freq: Two times a day (BID) | ORAL | 12 refills | Status: DC

## 2016-05-12 NOTE — Progress Notes (Signed)
Occupational Therapy Treatment Patient Details Name: Linda [name] MRN: [id number] DOB: Sep 19, 1945 Today's Date: 05/12/2016    History of present illness Pt is a 71 yo female admitted on 05/08/16 following a fall resulting from tripping over gym equiment. Pt sustained a left hip fx and underwent a THA on 05/09/16. PMH significant for COPD, GERD, HLD, HTN, Osteopenia, Hypothryoid.    OT comments  Pt continues to require cues for safety. Pt wants to return home alone with intermittent assist/sup from her children and HH therapies  Follow Up Recommendations  SNF;Supervision/Assistance - 24 hour (pt wants to return home with some sup from children)    Equipment Recommendations  3 in 1 bedside commode;Other (comment) (ADL A/E kit)    Recommendations for Other Services      Precautions / Restrictions Precautions Precautions: None Precaution Comments: direct anterior; h/o fall leading to this admission Restrictions Weight Bearing Restrictions: Yes LLE Weight Bearing: Weight bearing as tolerated       Mobility Bed Mobility Overal bed mobility: Needs Assistance Bed Mobility: Sit to Supine     Supine to sit: Min guard Sit to supine: Supervision   General bed mobility comments: no pyhsical assist required, pt used R LE to assist L LE onto bed  Transfers Overall transfer level: Needs assistance Equipment used: Rolling walker (2 wheeled) Transfers: Sit to/from Stand Sit to Stand: Supervision         General transfer comment: supervision for safety and cues for safety and use of AD    Balance Overall balance assessment: Needs assistance Sitting-balance support: No upper extremity supported Sitting balance-Leahy Scale: Good Sitting balance - Comments: able to sit EOB without assistance   Standing balance support: Bilateral upper extremity supported;No upper extremity supported;During functional activity Standing balance-Leahy Scale: Fair                     ADL  Overall ADL's : Needs assistance/impaired     Grooming: Standing;Wash/dry hands;Set up;Supervision/safety       Lower Body Bathing:  (simulated)       Lower Body Dressing:  (simulated)   Toilet Transfer: Ambulation;Cueing for safety;Comfort height toilet;RW;Supervision/safety   Toileting- Clothing Manipulation and Hygiene: Sit to/from stand;Supervision/safety         General ADL Comments: reviewed A/E for LB ADLs and pt instructed her daughter to Corporate treasurer. OT showed pt's daughter picture of reacher                                  Cognition   Behavior During Therapy: WFL for tasks assessed/performed Overall Cognitive Status: Within Functional Limits for tasks assessed                  General Comments: cues for correct/safe technique for toilet transfer    Extremity/Trunk Assessment   WFL                        General Comments  pt pleasant and cooperative    Pertinent Vitals/ Pain       Pain Assessment: 0-10 Pain Score: 4  Faces Pain Scale: Hurts little more (6 with therex) Pain Location: L hip Pain Descriptors / Indicators: Aching;Sore Pain Intervention(s): Monitored during session;Premedicated before session;Repositioned  Frequency  Min 2X/week        Progress Toward Goals  OT Goals(current goals can now be found in the care plan section)  Progress towards OT goals: Progressing toward goals  Acute Rehab OT Goals Patient Stated Goal: go home OT Goal Formulation: With patient/family  Plan Discharge plan remains appropriate                     End of Session Equipment Utilized During Treatment: Gait belt;Rolling walker   Activity Tolerance Patient tolerated treatment well   Patient Left in bed;with call bell/phone within reach;with family/visitor present             Time: 1002-1026 OT Time Calculation (min): 24  min  Charges: OT General Charges $OT Visit: 1 Procedure OT Treatments $Self Care/Home Management : 8-22 mins $Therapeutic Activity: 8-22 mins  Britt Bottom 05/12/2016, 12:14 PM

## 2016-05-12 NOTE — Progress Notes (Signed)
Pt ready for discharge from unit to home. All d/c instructions reviewed with pt and d/c rx.  Pt reminded and encouraged to use walker when ambulating in room. Pt exhibits no s/sx of distress, voices no c/o. All personal belongings with pt. Pt currently waiting on daughter to arrive to transport.

## 2016-05-12 NOTE — Discharge Summary (Addendum)
[facility] Discharge Summary   Patient: Linda [name] [id number]   PCP: Ernestene Kiel, MD DOB: 08-31-1945   Date of admission: 05/08/2016   Date of discharge: 05/12/2016    Discharge Diagnoses:  Principal Problem:   Closed left femoral fracture ([place]) Active Problems:   COPD, group D, by GOLD 2017 classification ([place])   Hypothyroidism   Admitted From: home Disposition:  Home with home health  Recommendations for Outpatient Follow-up:  1. Follow-up with PCP in one week. 2. Follow-up with orthopedics as recommended.   Follow-up Information    Mauri Pole, MD. Schedule an appointment as soon as possible for a visit in 2 week(s).   Specialty:  Orthopedic Surgery Contact information: [address] [place] [address] [phone]        [facility] Follow up.   Why:  home health physical therpy and aide ane rolling walker and 3n1 Contact information: [address] [place] [address] [phone]        PROCHNAU,CAROLINE, MD. Schedule an appointment as soon as possible for a visit in 1 week(s).   Specialty:  Internal Medicine Contact information: [address] [place] [address] [phone]          Diet recommendation: Cardiac diet  Activity: The patient is advised to gradually reintroduce usual activities.  Discharge Condition: good  Code Status: Full code  History of present illness: As per the H and P dictated on admission, "Linda [name] is a 71 y.o. female h/o HTN, HLD, hypothyroidism, gerd, osteopenia, Prior smoker, h/o copd has been stable, no recent flare, not on home o2, not steroids dependent, per patient she has been having an active life and goes to the gym regularly. Today, she tripped and fell at [facility], report severe left hip pain, and left leg shortening, she is brought to the ED by EMS, hip x ray confirmed "Acute left femoral neck fracture appears comminuted with varus  impaction. The intertrochanteric segment appears intact, with no other acute fracture or dislocation identified about the pelvis." , orthopedic Dr Frankey Shown consulted, plan to have surgery on 2/9, hospitalist called to admit the patient.  Patient report the fall is accidental, no recent illness, no fever, no chest pain, no dizziness, no sob, no n/v, no diarrhea, no dysuria, no recent change of medications. Basic labs obtained in the ED with elevated wbc at 16.6, sodium 133, cr, lft wnl. cxr with copd changes, no acute findings, ekg sinus rhythm, no acute st/t changes. Vital stable."  Hospital Course:   Summary of her active problems in the hospital is as following. Left hip fracture from mechanical fall: status post left total hip replacement - Hemoglobin remains stable, tolerated the procedure well.  - Continue pain control, bowel regimen - Orthopedic has prescribed Xarelto for DVT prophylaxis at home. Recommend to follow-up with PCP in one week to ensure no active bleeding or competition from this.  Active problems  Leukocytosis: - likely from stress demargination, resolved -no sign of infection, no fever, cxr no infiltrates - UA negative  Mild hyponatremia: - from dehydration, she also has significant amount of pain - resolved   Copd:  - is currently stable, no wheezing, continue Spiriva, dulera, albuterol nebs   HTN - Currently stable, continuenorvasc, losartan,   HLD -continue statin  Hypothyroidism: - continue synthroid   All other chronic medical condition were stable during the hospitalization.  Patient was seen by physical therapy, who recommended SNF, patient has  decided to go home with home health, which was arranged by Education officer, museum and case Freight forwarder. On the day of the discharge the patient's vitals were stable, and no other acute medical condition were reported by patient. the patient was felt safe to be discharge at home  with home  health.  Procedures and Results:   Left total hip replacement Dr. Alvan Dame 05/09/2016  Consultations:  Orthopedics Dr. Cruzita Lederer MEDICATION: Discharge Medication List as of 05/12/2016 11:04 AM    START taking these medications   Details  docusate sodium (COLACE) 100 MG capsule Take 1 capsule (100 mg total) by mouth 2 (two) times daily., Starting Fri 05/09/2016, No Print    feeding supplement, ENSURE ENLIVE, (ENSURE ENLIVE) LIQD Take 237 mLs by mouth 2 (two) times daily between meals., Starting Mon 05/12/2016, Normal    ferrous sulfate (FERROUSUL) 325 (65 FE) MG tablet Take 1 tablet (325 mg total) by mouth 3 (three) times daily with meals., Starting Fri 05/09/2016, No Print    HYDROcodone-acetaminophen (NORCO) 7.5-325 MG tablet Take 1-2 tablets by mouth every 4 (four) hours as needed for moderate pain or severe pain., Starting Fri 05/09/2016, Print    methocarbamol (ROBAXIN) 500 MG tablet Take 1 tablet (500 mg total) by mouth every 6 (six) hours as needed for muscle spasms., Starting Fri 05/09/2016, Print    polyethylene glycol (MIRALAX / GLYCOLAX) packet Take 17 g by mouth 2 (two) times daily., Starting Fri 05/09/2016, No Print    rivaroxaban (XARELTO) 10 MG TABS tablet Take 1 tablet (10 mg total) by mouth daily., Starting Fri 05/09/2016, Print      CONTINUE these medications which have NOT CHANGED   Details  albuterol (PROAIR HFA) 108 (90 BASE) MCG/ACT inhaler Inhale 2 puffs into the lungs every 6 (six) hours as needed.  , Historical Med    albuterol (PROVENTIL) (2.5 MG/3ML) 0.083% nebulizer solution Take 2.5 mg by nebulization 4 (four) times daily.  , Historical Med    amLODipine (NORVASC) 5 MG tablet Take 5 mg by mouth daily., Historical Med    budesonide-formoterol (SYMBICORT) 160-4.5 MCG/ACT inhaler Inhale 2 puffs into the lungs 2 (two) times daily., Historical Med    Cholecalciferol (VITAMIN D3) 1000 UNITS CAPS Take 1,000 Units by mouth once.  , Historical Med    DALIRESP 500 MCG  TABS tablet TAKE ONE TABLET BY MOUTH ONCE DAILY, Normal    levothyroxine (SYNTHROID, LEVOTHROID) 88 MCG tablet Take 88 mcg by mouth daily.  , Historical Med    losartan (COZAAR) 100 MG tablet Take 100 mg by mouth daily.  , Historical Med    Magnesium Chloride (MAGNESIUM DR PO) Take by mouth daily., Historical Med    meloxicam (MOBIC) 15 MG tablet Take 15 mg by mouth daily.  , Historical Med    omeprazole (PRILOSEC) 20 MG capsule Take 20 mg by mouth daily.  , Historical Med    OVER THE COUNTER MEDICATION Tru Biotics, Enzyme Defense, Natural Calm, Historical Med    pravastatin (PRAVACHOL) 20 MG tablet Take 20 mg by mouth daily.  , Historical Med    tiotropium (SPIRIVA) 18 MCG inhalation capsule Place 18 mcg into inhaler and inhale daily.  , Historical Med      STOP taking these medications     Omeprazole-Sodium Bicarbonate (ZEGERID) 20-1100 MG CAPS        Allergies  Allergen Reactions  . Aspirin     ulcer   Discharge Instructions    Diet general  Complete by:  As directed    Discharge instructions    Complete by:  As directed    It is important that you read following instructions as well as go over your medication list with RN to help you understand your care after this hospitalization.  Discharge Instructions: Please follow-up with PCP in one week  Please request your primary care physician to go over all Hospital Tests and Procedure/Radiological results at the follow up,  Please get all Hospital records sent to your PCP by signing hospital release before you go home.   Do not drive, operating heavy machinery, perform activities at heights, swimming or participation in water activities or provide baby sitting services while your are on Pain, Sleep and Anxiety Medications; until you have been seen by Primary Care Physician or a Neurologist and advised to do so again. Do not take more than prescribed Pain, Sleep and Anxiety Medications. You were cared for by a hospitalist  during your hospital stay. If you have any questions about your discharge medications or the care you received while you were in the hospital after you are discharged, you can call the unit and ask to speak with the hospitalist on call if the hospitalist that took care of you is not available.  Once you are discharged, your primary care physician will handle any further medical issues. Please note that NO REFILLS for any discharge medications will be authorized once you are discharged, as it is imperative that you return to your primary care physician (or establish a relationship with a primary care physician if you do not have one) for your aftercare needs so that they can reassess your need for medications and monitor your lab values. You Must read complete instructions/literature along with all the possible adverse reactions/side effects for all the Medicines you take and that have been prescribed to you. Take any new Medicines after you have completely understood and accept all the possible adverse reactions/side effects. Wear Seat belts while driving. If you have smoked or chewed Tobacco in the last 2 yrs please stop smoking and/or stop any Recreational drug use.   Increase activity slowly    Complete by:  As directed    Weight bearing as tolerated    Complete by:  As directed    Laterality:  left   Extremity:  Lower     Discharge Exam: Filed Weights   05/09/16 1503  Weight: 57.6 kg (127 lb)   Vitals:   05/11/16 2137 05/12/16 0517  BP: (!) 151/72 139/67  Pulse: 91 79  Resp: 16 16  Temp: 99.7 F (37.6 C) 99.3 F (37.4 C)   General: Appear in no distress, no Rash; Oral Mucosa moist. Cardiovascular: S1 and S2 Present, no Murmur, no JVD Respiratory: Bilateral Air entry present and Clear to Auscultation, no Crackles, no wheezes Abdomen: Bowel Sound present, Soft and no tenderness Extremities: no Pedal edema, no calf tenderness Neurology: Grossly no focal neuro deficit.  The results  of significant diagnostics from this hospitalization (including imaging, microbiology, ancillary and laboratory) are listed below for reference.    Significant Diagnostic Studies: Dg Chest 1 View  Result Date: 05/08/2016 CLINICAL DATA:  71 year old female tripped and fell on exercise machine. Proximal left femur fracture. Initial encounter. EXAM: CHEST 1 VIEW COMPARISON:  Chest radiographs 06/01/2013.  Chest CTA 02/26/2015. FINDINGS: AP supine view of the chest 1553 hours. Stable cardiac size at the upper limits of normal. Other mediastinal contours are within normal limits. Visualized tracheal air  column is within normal limits. No pneumothorax, pulmonary edema, pleural effusion or confluent pulmonary opacity. Large lung volumes, centrilobular emphysema demonstrated in 2016. IMPRESSION: Emphysema.  No acute cardiopulmonary abnormality. Electronically Signed   By: Genevie Ann M.D.   On: 05/08/2016 16:11   Dg C-arm 1-60 Min  Result Date: 05/09/2016 CLINICAL DATA:  Arthroplasty EXAM: OPERATIVE LEFT HIP (WITH PELVIS IF PERFORMED) single VIEWS TECHNIQUE: Fluoroscopic spot image(s) were submitted for interpretation post-operatively. COMPARISON:  None. FINDINGS: LEFT hip total arthroplasty.  No fracture or dislocation IMPRESSION: No complication following LEFT hip arthroplasty. Electronically Signed   By: Suzy Bouchard M.D.   On: 05/09/2016 17:19   Dg Hip Port Unilat With Pelvis 1v Left  Result Date: 05/09/2016 CLINICAL DATA:  Post LEFT hip replacement EXAM: DG HIP (WITH OR WITHOUT PELVIS) 1V PORT LEFT COMPARISON:  Portable exam 1704 hours compared to intraoperative images of 05/09/2016 FINDINGS: Components of a LEFT hip prosthesis are identified in expected positions. Bones appear demineralized. No acute fracture, dislocation, or bone destruction. Scattered soft tissue gas from surgery. SI joints and RIGHT hip joint space appear preserved. IMPRESSION: LEFT hip prosthesis without acute complication.  Electronically Signed   By: Lavonia Dana M.D.   On: 05/09/2016 17:21   Dg Hip Operative Unilat W Or W/o Pelvis Left  Result Date: 05/09/2016 CLINICAL DATA:  Arthroplasty EXAM: OPERATIVE LEFT HIP (WITH PELVIS IF PERFORMED) single VIEWS TECHNIQUE: Fluoroscopic spot image(s) were submitted for interpretation post-operatively. COMPARISON:  None. FINDINGS: LEFT hip total arthroplasty.  No fracture or dislocation IMPRESSION: No complication following LEFT hip arthroplasty. Electronically Signed   By: Suzy Bouchard M.D.   On: 05/09/2016 17:19   Dg Hip Unilat With Pelvis 2-3 Views Left  Result Date: 05/08/2016 CLINICAL DATA:  71 year old female tripped and fell on exercise machine. Initial encounter. EXAM: DG HIP (WITH OR WITHOUT PELVIS) 2-3V LEFT COMPARISON:  None. FINDINGS: There is a comminuted fracture of the left femoral neck with varus impaction. The left proximal femur intertrochanteric segment appears to remain intact. The left femoral head remains normally located at the left acetabulum. The right femoral head is normally located. The pelvis appears intact. SI joints within normal limits. Negative visible bowel gas pattern. IMPRESSION: Acute left femoral neck fracture appears comminuted with varus impaction. The intertrochanteric segment appears intact, with no other acute fracture or dislocation identified about the pelvis. Electronically Signed   By: Genevie Ann M.D.   On: 05/08/2016 16:10    Microbiology: Recent Results (from the past 240 hour(s))  Urine culture     Status: None   Collection Time: 05/08/16 12:22 AM  Result Value Ref Range Status   Specimen Description URINE, RANDOM  Final   Special Requests NONE  Final   Culture NO GROWTH  Final   Report Status 05/10/2016 FINAL  Final  Surgical PCR screen     Status: None   Collection Time: 05/09/16 12:14 AM  Result Value Ref Range Status   MRSA, PCR NEGATIVE NEGATIVE Final   Staphylococcus aureus NEGATIVE NEGATIVE Final    Comment:          The Xpert SA Assay (FDA approved for NASAL specimens in patients over 79 years of age), is one component of a comprehensive surveillance program.  Test performance has been validated by [facility] for patients greater than or equal to 71 year old. It is not intended to diagnose infection nor to guide or monitor treatment.      Labs: CBC:  Recent Labs  Lab 05/08/16 1650 05/09/16 0523 05/10/16 0522 05/11/16 0248 05/12/16 0330  WBC 16.6* 12.0* 9.5 9.5 8.8  NEUTROABS 14.9*  --   --   --   --   HGB 14.1 13.8 10.9* 10.7* 10.2*  HCT 42.4 41.9 33.6* 32.6* 31.7*  MCV 91.4 92.1 92.6 91.3 91.4  PLT 315 323 210 211 [id number]   Basic Metabolic Panel:  Recent Labs Lab 05/08/16 1650 05/09/16 0523 05/10/16 0522 05/11/16 0248 05/12/16 0330  NA 133* 136 134* 132* 137  K 3.6 3.9 3.5 3.5 3.5  CL 99* 98* 99* 99* 100*  CO2 24 28 24 24 29   GLUCOSE 129* 117* 110* 125* 104*  BUN 11 17 14 15 14   CREATININE 0.94 1.40* 1.12* 1.13* 1.10*  CALCIUM 9.1 9.3 8.0* 8.2* 8.5*   Liver Function Tests:  Recent Labs Lab 05/08/16 1650 05/09/16 0523  AST 21 23  ALT 20 18  ALKPHOS 65 63  BILITOT 0.6 0.7  PROT 6.6 6.0*  ALBUMIN 4.0 3.7    Recent Labs Lab 05/08/16 1650  LIPASE 29   Time spent: 30 minutes  Signed:  Lylian Sanagustin  [facility] 05/12/2016 , 3:31 PM

## 2016-05-12 NOTE — Progress Notes (Signed)
Patient ID: Linda [name], female   DOB: Jul 21, 1945, 71 y.o.   MRN: [id number] Subjective: 3 Days Post-Op Procedure(s) (LRB): TOTAL HIP ARTHROPLASTY ANTERIOR APPROACH (Left)    Patient reports pain as mild in comparison to the pain at time of fracture.  Complains predominantly of thigh pain. Non events or complications thus far  Objective:   VITALS:   Vitals:   05/11/16 2137 05/12/16 0517  BP: (!) 151/72 139/67  Pulse: 91 79  Resp: 16 16  Temp: 99.7 F (37.6 C) 99.3 F (37.4 C)    Neurovascular intact Incision: dressing C/D/I  LABS  Recent Labs  05/10/16 0522 05/11/16 0248 05/12/16 0330  HGB 10.9* 10.7* 10.2*  HCT 33.6* 32.6* 31.7*  WBC 9.5 9.5 8.8  PLT 210 211 230     Recent Labs  05/10/16 0522 05/11/16 0248 05/12/16 0330  NA 134* 132* 137  K 3.5 3.5 3.5  BUN 14 15 14   CREATININE 1.12* 1.13* 1.10*  GLUCOSE 110* 125* 104*    No results for input(s): LABPT, INR in the last 72 hours.   Assessment/Plan: 3 Days Post-Op Procedure(s) (LRB): TOTAL HIP ARTHROPLASTY ANTERIOR APPROACH (Left)   Up with therapy Discharge home with home health   I feel that she would do fine being discharged to home.  She will have some family support initially and then arrangements can be made for help at home if needed  I think she will regain functional independence sooner if at home and will be happier in comfort of her own environment  RTC in 2 weeks Discharge meds reconciled Xarelto at discharge for 2 weeks for DVT prophylaxis

## 2016-05-12 NOTE — Care Management Important Message (Signed)
Important Message  Patient Details  Name: Linda [name] MRN: [id number] Date of Birth: 1946-01-15   Medicare Important Message Given:  Yes    Orbie Pyo 05/12/2016, 4:27 PM

## 2016-05-12 NOTE — Progress Notes (Signed)
Physical Therapy Treatment Patient Details Name: Linda [name] MRN: [id number] DOB: 03-02-1946 Today's Date: 05/12/2016    History of Present Illness Pt is a 71 yo female admitted on 05/08/16 following a fall resulting from tripping over gym equiment. Pt sustained a left hip fx and underwent a THA on 05/09/16. PMH significant for COPD, GERD, HLD, HTN, Osteopenia, Hypothryoid.     PT Comments    Patient continue to need supervision/assistance for safety and is impulsive. Pt educated on importance of using RW at all times. Pt will have intermittent assist from children but will be alone most of the day. If pt continues to refuse ST-SNF to maximize independence and safety with mobility she will need HHPT and assistance/supervision for OOB mobility.   Follow Up Recommendations  SNF;Supervision for mobility/OOB     Equipment Recommendations  Rolling walker with 5" wheels;3in1 (PT)    Recommendations for Other Services       Precautions / Restrictions Precautions Precautions: None Precaution Comments: direct anterior; h/o fall leading to this admission Restrictions Weight Bearing Restrictions: Yes LLE Weight Bearing: Weight bearing as tolerated    Mobility  Bed Mobility Overal bed mobility: Needs Assistance Bed Mobility: Sit to Supine     Supine to sit: Min guard Sit to supine: Supervision   General bed mobility comments: no pyhsical assist required, pt used R LE to assist L LE onto bed  Transfers Overall transfer level: Needs assistance Equipment used: Rolling walker (2 wheeled) Transfers: Sit to/from Stand Sit to Stand: Supervision         General transfer comment: supervision for safety and cues for safety and use of AD  Ambulation/Gait Ambulation/Gait assistance: Min guard Ambulation Distance (Feet): 200 Feet Assistive device: Rolling walker (2 wheeled) Gait Pattern/deviations: Step-to pattern;Step-through pattern;Decreased stance time - left;Decreased step length  - right;Decreased dorsiflexion - left;Decreased weight shift to left;Steppage Gait velocity: decreased   General Gait Details: cues for L knee/hip flexion during swing phase to increase L foot clearance without circumduction of L LE; pt then began vaulting gait pattern and when given cues again became agitated; with increased verbal and visual cues pt able ot take a more natural L step however does demonstrate decreased foot clearance; pt has tendency to take hands off of RW and therapist advised pt to use RW at all times to decrease risk of falls   Stairs Stairs: Yes   Stair Management: No rails;Backwards;Step to pattern;With walker Number of Stairs: 3 General stair comments: cues for sequencing and technique; assist to stabilize RW  Wheelchair Mobility    Modified Rankin (Stroke Patients Only)       Balance Overall balance assessment: Needs assistance Sitting-balance support: No upper extremity supported Sitting balance-Leahy Scale: Good Sitting balance - Comments: able to sit EOB without assistance   Standing balance support: Bilateral upper extremity supported;No upper extremity supported;During functional activity Standing balance-Leahy Scale: Fair                      Cognition Arousal/Alertness: Awake/alert Behavior During Therapy: WFL for tasks assessed/performed Overall Cognitive Status: Within Functional Limits for tasks assessed                 General Comments: cues for correct/safe technique for toilet transfer    Exercises Total Joint Exercises Quad Sets: AROM;Both;10 reps Gluteal Sets: AROM;10 reps Heel Slides: AROM;10 reps;Right Hip ABduction/ADduction: AROM;Right;10 reps Long Arc Quad: AROM;Right;10 reps    General Comments  Pertinent Vitals/Pain Pain Assessment: 0-10 Pain Score: 4  Faces Pain Scale: Hurts little more (6 with therex) Pain Location: L hip Pain Descriptors / Indicators: Aching;Sore Pain Intervention(s):  Monitored during session;Premedicated before session;Repositioned    Home Living                      Prior Function            PT Goals (current goals can now be found in the care plan section) Acute Rehab PT Goals Patient Stated Goal: go home Progress towards PT goals: Progressing toward goals    Frequency    Min 5X/week      PT Plan Current plan remains appropriate    Co-evaluation             End of Session Equipment Utilized During Treatment: Gait belt Activity Tolerance: Patient tolerated treatment well Patient left: with call bell/phone within reach;Other (comment) (OT with pt to begin session)     Time: 0932-1002 PT Time Calculation (min) (ACUTE ONLY): 30 min  Charges:  $Gait Training: 8-22 mins $Therapeutic Exercise: 8-22 mins                    G Codes:      Salina April, PTA Pager: [phone]   05/12/2016, 1:54 PM

## 2016-05-14 DIAGNOSIS — Z791 Long term (current) use of non-steroidal anti-inflammatories (NSAID): Secondary | ICD-10-CM | POA: Diagnosis not present

## 2016-05-14 DIAGNOSIS — Z9181 History of falling: Secondary | ICD-10-CM | POA: Diagnosis not present

## 2016-05-14 DIAGNOSIS — I10 Essential (primary) hypertension: Secondary | ICD-10-CM | POA: Diagnosis not present

## 2016-05-14 DIAGNOSIS — S72001D Fracture of unspecified part of neck of right femur, subsequent encounter for closed fracture with routine healing: Secondary | ICD-10-CM | POA: Diagnosis not present

## 2016-05-14 DIAGNOSIS — K219 Gastro-esophageal reflux disease without esophagitis: Secondary | ICD-10-CM | POA: Diagnosis not present

## 2016-05-14 DIAGNOSIS — Z7951 Long term (current) use of inhaled steroids: Secondary | ICD-10-CM | POA: Diagnosis not present

## 2016-05-14 DIAGNOSIS — Z96641 Presence of right artificial hip joint: Secondary | ICD-10-CM | POA: Diagnosis not present

## 2016-05-14 DIAGNOSIS — E785 Hyperlipidemia, unspecified: Secondary | ICD-10-CM | POA: Diagnosis not present

## 2016-05-14 DIAGNOSIS — J449 Chronic obstructive pulmonary disease, unspecified: Secondary | ICD-10-CM | POA: Diagnosis not present

## 2016-05-27 DIAGNOSIS — Z96641 Presence of right artificial hip joint: Secondary | ICD-10-CM | POA: Diagnosis not present

## 2016-05-27 DIAGNOSIS — M810 Age-related osteoporosis without current pathological fracture: Secondary | ICD-10-CM | POA: Insufficient documentation

## 2016-05-27 DIAGNOSIS — Z7951 Long term (current) use of inhaled steroids: Secondary | ICD-10-CM | POA: Diagnosis not present

## 2016-05-27 DIAGNOSIS — Z791 Long term (current) use of non-steroidal anti-inflammatories (NSAID): Secondary | ICD-10-CM | POA: Diagnosis not present

## 2016-05-27 DIAGNOSIS — I10 Essential (primary) hypertension: Secondary | ICD-10-CM | POA: Diagnosis not present

## 2016-05-27 DIAGNOSIS — E785 Hyperlipidemia, unspecified: Secondary | ICD-10-CM | POA: Diagnosis not present

## 2016-05-27 DIAGNOSIS — J449 Chronic obstructive pulmonary disease, unspecified: Secondary | ICD-10-CM | POA: Diagnosis not present

## 2016-05-27 DIAGNOSIS — S72001D Fracture of unspecified part of neck of right femur, subsequent encounter for closed fracture with routine healing: Secondary | ICD-10-CM | POA: Diagnosis not present

## 2016-05-27 DIAGNOSIS — Z9181 History of falling: Secondary | ICD-10-CM | POA: Diagnosis not present

## 2016-05-27 DIAGNOSIS — K219 Gastro-esophageal reflux disease without esophagitis: Secondary | ICD-10-CM | POA: Diagnosis not present

## 2016-05-27 HISTORY — DX: Age-related osteoporosis without current pathological fracture: M81.0

## 2016-06-25 DIAGNOSIS — Z96642 Presence of left artificial hip joint: Secondary | ICD-10-CM | POA: Diagnosis not present

## 2016-06-25 DIAGNOSIS — Z471 Aftercare following joint replacement surgery: Secondary | ICD-10-CM | POA: Diagnosis not present

## 2016-07-03 ENCOUNTER — Ambulatory Visit
Admission: RE | Admit: 2016-07-03 | Discharge: 2016-07-03 | Disposition: A | Discharge status: Home / Self Care | Payer: PPO | Source: Ambulatory Visit | Attending: Internal Medicine | Admitting: Internal Medicine

## 2016-07-03 ENCOUNTER — Other Ambulatory Visit: Payer: Self-pay | Admitting: Internal Medicine

## 2016-07-03 DIAGNOSIS — N63 Unspecified lump in unspecified breast: Secondary | ICD-10-CM

## 2016-07-03 DIAGNOSIS — R599 Enlarged lymph nodes, unspecified: Secondary | ICD-10-CM

## 2016-07-03 DIAGNOSIS — N6310 Unspecified lump in the right breast, unspecified quadrant: Secondary | ICD-10-CM

## 2016-07-03 DIAGNOSIS — N6311 Unspecified lump in the right breast, upper outer quadrant: Secondary | ICD-10-CM | POA: Diagnosis not present

## 2016-07-03 HISTORY — DX: Unspecified lump in unspecified breast: N63.0

## 2016-07-09 ENCOUNTER — Other Ambulatory Visit: Payer: Self-pay | Admitting: Internal Medicine

## 2016-07-09 ENCOUNTER — Ambulatory Visit
Admission: RE | Admit: 2016-07-09 | Discharge: 2016-07-09 | Disposition: A | Discharge status: Home / Self Care | Payer: PPO | Source: Ambulatory Visit | Attending: Internal Medicine | Admitting: Internal Medicine

## 2016-07-09 DIAGNOSIS — C50411 Malignant neoplasm of upper-outer quadrant of right female breast: Secondary | ICD-10-CM | POA: Diagnosis not present

## 2016-07-09 DIAGNOSIS — R599 Enlarged lymph nodes, unspecified: Secondary | ICD-10-CM

## 2016-07-09 DIAGNOSIS — N6311 Unspecified lump in the right breast, upper outer quadrant: Secondary | ICD-10-CM | POA: Diagnosis not present

## 2016-07-09 DIAGNOSIS — R590 Localized enlarged lymph nodes: Secondary | ICD-10-CM | POA: Diagnosis not present

## 2016-07-09 DIAGNOSIS — N6310 Unspecified lump in the right breast, unspecified quadrant: Secondary | ICD-10-CM

## 2016-07-09 DIAGNOSIS — C773 Secondary and unspecified malignant neoplasm of axilla and upper limb lymph nodes: Secondary | ICD-10-CM | POA: Diagnosis not present

## 2016-07-09 HISTORY — PX: BREAST BIOPSY: SHX20

## 2016-07-18 ENCOUNTER — Telehealth: Payer: Self-pay | Admitting: Hematology and Oncology

## 2016-07-18 ENCOUNTER — Encounter: Payer: Self-pay | Admitting: Hematology and Oncology

## 2016-07-18 NOTE — Telephone Encounter (Signed)
Appt has been scheduled for the pt to see Dr. Lindi Adie on 4/27 at 1pm. Pt agreed to the appt date and time. Demographics verified. Letter mailed to the pt.

## 2016-07-21 ENCOUNTER — Other Ambulatory Visit: Payer: Self-pay | Admitting: General Surgery

## 2016-07-21 DIAGNOSIS — Z170 Estrogen receptor positive status [ER+]: Secondary | ICD-10-CM | POA: Diagnosis not present

## 2016-07-21 DIAGNOSIS — C50411 Malignant neoplasm of upper-outer quadrant of right female breast: Secondary | ICD-10-CM | POA: Diagnosis not present

## 2016-07-22 ENCOUNTER — Telehealth: Payer: Self-pay | Admitting: Radiation Oncology

## 2016-07-22 NOTE — Telephone Encounter (Signed)
Called to schedule appointment for patient, she would like to wait until after Medical Oncology appointment before scheduling with Radiation Oncology

## 2016-07-23 ENCOUNTER — Other Ambulatory Visit: Payer: Self-pay | Admitting: Pulmonary Disease

## 2016-07-25 ENCOUNTER — Encounter: Payer: Self-pay | Admitting: Hematology and Oncology

## 2016-07-25 ENCOUNTER — Other Ambulatory Visit: Payer: Self-pay | Admitting: *Deleted

## 2016-07-25 ENCOUNTER — Encounter: Payer: Self-pay | Admitting: *Deleted

## 2016-07-25 ENCOUNTER — Ambulatory Visit (HOSPITAL_BASED_OUTPATIENT_CLINIC_OR_DEPARTMENT_OTHER): Payer: PPO | Admitting: Hematology and Oncology

## 2016-07-25 DIAGNOSIS — C50411 Malignant neoplasm of upper-outer quadrant of right female breast: Secondary | ICD-10-CM

## 2016-07-25 DIAGNOSIS — Z853 Personal history of malignant neoplasm of breast: Secondary | ICD-10-CM

## 2016-07-25 DIAGNOSIS — Z170 Estrogen receptor positive status [ER+]: Secondary | ICD-10-CM

## 2016-07-25 DIAGNOSIS — C773 Secondary and unspecified malignant neoplasm of axilla and upper limb lymph nodes: Secondary | ICD-10-CM

## 2016-07-25 HISTORY — DX: Personal history of malignant neoplasm of breast: Z85.3

## 2016-07-25 MED ORDER — ANASTROZOLE 1 MG PO TABS: 1.0000 mg | ORAL_TABLET | Freq: Every day | ORAL | 0 refills | Status: DC

## 2016-07-25 NOTE — Progress Notes (Signed)
[facility] NOTE  Patient Care Team: Ernestene Kiel, MD as PCP - General (Internal Medicine) Collene Gobble, MD (Pulmonary Disease)  CHIEF COMPLAINTS/PURPOSE OF CONSULTATION:  Newly diagnosed breast cancer  HISTORY OF PRESENTING ILLNESS:  Kotzebue Cellar 71 y.o. female is here because of recent diagnosis of right breast cancer. She felt a lump in the right breast about a year ago and thought it was benign. 6 months ago she found a lump in the right axilla. She did not think too much about it until recently. She then underwent a mammogram and ultrasound which revealed a mass in the right breast and axillary lymphadenopathy. She underwent ultrasound-guided biopsy of both the mass and the lymph node both of which were positive for invasive ductal carcinoma that was grade 3 and a ER/PR positive and HER-2 negative. She was seen by Dr. Barry Dienes who referred the patient to me for discussion regarding treatment plan. Patient recently hurt herself in the gym and had a hip fracture which was repaired and she has a new hip replacement. I reviewed her records extensively and collaborated the history with the patient.  SUMMARY OF ONCOLOGIC HISTORY:   Malignant neoplasm of upper-outer quadrant of right breast in female, estrogen receptor positive ([place])   07/03/2016 Initial Diagnosis    Right breast biopsy 10:30 position: IDC, lymphovascular invasion present, right axillary lymph node positive, grade 3, ER 90%, PR 60%, Ki-67 12%, HER-2 negative ratio 0.41; right breast mass 9 cm from nipple and 30 position: 1.7 x 1 x 1.6 cm, 2 abnormal lymph nodes right axilla largest 1.9 cm smaller 1.2 cm; T1c N1 stage II a      MEDICAL HISTORY:  Past Medical History:  Diagnosis Date  . Arthritis    "qwhere; hands, feet, different body parts from time to time" (05/08/2016)  . Asthma   . Breast mass 07/03/2016  . Complication of anesthesia    "they used an adult tube going down my throat; caused  alot of problems; was told to always have pediatric tube placed" (05/08/2016)  . COPD (chronic obstructive pulmonary disease) ([place])   . GERD (gastroesophageal reflux disease)   . History of hiatal hernia   . Hyperglycemia   . Hyperlipidemia   . Hypertension   . Hypothyroidism   . Lump of breast, right    "suppose to have it checked soon" (05/08/2016)  . Migraine    "none for years" (05/08/2016)  . Osteopenia   . Pneumonia "several times"  . Status post radioactive iodine thyroid ablation   . Thyroid disease     SURGICAL HISTORY: Past Surgical History:  Procedure Laterality Date  . 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 APPENDECTOMY  1963  . DILATION AND CURETTAGE OF UTERUS  "several"   "when I was young"  . INGUINAL HERNIA REPAIR Bilateral 2000  . POLYPECTOMY     "vocal cord"  . TONSILLECTOMY    . TOTAL HIP ARTHROPLASTY Left 05/09/2016   Procedure: TOTAL HIP ARTHROPLASTY ANTERIOR APPROACH;  Surgeon: Paralee Cancel, MD;  Location: [facility];  Service: Orthopedics;  Laterality: Left;  . TUBAL LIGATION    . VAGINAL HYSTERECTOMY  1979    SOCIAL HISTORY: Social History   Social History  . Marital status: Divorced    Spouse name: N/A  . Number of children: 3  . Years of education: N/A   Occupational History  . retired    Social History  Main Topics  . Smoking status: Former Smoker    Packs/day: 1.00    Years: 25.00    Types: Cigarettes    Quit date: 03/31/2006  . Smokeless tobacco: Never Used  . Alcohol use 0.0 oz/week     Comment: 05/08/2016 "I'll have a drink on special occasions; usually wine"  . Drug use: No  . Sexual activity: No   Other Topics Concern  . Not on file   Social History Narrative  . No narrative on file    FAMILY HISTORY: Family History  Problem Relation Age of Onset  . Hypertension Father   . Hypertension Mother   . Stroke Mother   . Heart attack Mother   . Allergies Mother     ALLERGIES:  is allergic to  aspirin.  MEDICATIONS:  Current Outpatient Prescriptions  Medication Sig Dispense Refill  . albuterol (PROAIR HFA) 108 (90 BASE) MCG/ACT inhaler Inhale 2 puffs into the lungs every 6 (six) hours as needed.      Marland Kitchen albuterol (PROVENTIL) (2.5 MG/3ML) 0.083% nebulizer solution Take 2.5 mg by nebulization 4 (four) times daily.      Marland Kitchen amLODipine (NORVASC) 5 MG tablet Take 5 mg by mouth daily.    . budesonide-formoterol (SYMBICORT) 160-4.5 MCG/ACT inhaler Inhale 2 puffs into the lungs 2 (two) times daily.    . Cholecalciferol (VITAMIN D3) 1000 UNITS CAPS Take 1,000 Units by mouth once.      Marland Kitchen DALIRESP 500 MCG TABS tablet TAKE ONE TABLET BY MOUTH DAILY 30 tablet 0  . docusate sodium (COLACE) 100 MG capsule Take 1 capsule (100 mg total) by mouth 2 (two) times daily. 10 capsule 0  . feeding supplement, ENSURE ENLIVE, (ENSURE ENLIVE) LIQD Take 237 mLs by mouth 2 (two) times daily between meals. 237 mL 12  . ferrous sulfate (FERROUSUL) 325 (65 FE) MG tablet Take 1 tablet (325 mg total) by mouth 3 (three) times daily with meals.    Marland Kitchen HYDROcodone-acetaminophen (NORCO) 7.5-325 MG tablet Take 1-2 tablets by mouth every 4 (four) hours as needed for moderate pain or severe pain. 60 tablet 0  . levothyroxine (SYNTHROID, LEVOTHROID) 88 MCG tablet Take 88 mcg by mouth daily.      Marland Kitchen losartan (COZAAR) 100 MG tablet Take 100 mg by mouth daily.      . Magnesium Chloride (MAGNESIUM DR PO) Take by mouth daily.    . meloxicam (MOBIC) 15 MG tablet Take 15 mg by mouth daily.      . methocarbamol (ROBAXIN) 500 MG tablet Take 1 tablet (500 mg total) by mouth every 6 (six) hours as needed for muscle spasms. 40 tablet 0  . omeprazole (PRILOSEC) 20 MG capsule Take 20 mg by mouth daily.      Marland Kitchen OVER THE COUNTER MEDICATION Tru Biotics, Enzyme Defense, Natural Calm    . polyethylene glycol (MIRALAX / GLYCOLAX) packet Take 17 g by mouth 2 (two) times daily. 14 each 0  . pravastatin (PRAVACHOL) 20 MG tablet Take 20 mg by mouth daily.       . rivaroxaban (XARELTO) 10 MG TABS tablet Take 1 tablet (10 mg total) by mouth daily. 14 tablet 0  . tiotropium (SPIRIVA) 18 MCG inhalation capsule Place 18 mcg into inhaler and inhale daily.       No current facility-administered medications for this visit.     REVIEW OF SYSTEMS:   Constitutional: Denies fevers, chills or abnormal night sweats Eyes: Denies blurriness of vision, double vision or watery eyes Ears,  nose, mouth, throat, and face: Denies mucositis or sore throat Respiratory: Denies cough, dyspnea or wheezes Cardiovascular: Denies palpitation, chest discomfort or lower extremity swelling Gastrointestinal:  Denies nausea, heartburn or change in bowel habits Skin: Denies abnormal skin rashes Lymphatics: Denies new lymphadenopathy or easy bruising Neurological:Denies numbness, tingling or new weaknesses Behavioral/Psych: Mood is stable, no new changes  Breast:  Palpable lump in the right breast and axillary adenopathy All other systems were reviewed with the patient and are negative.  PHYSICAL EXAMINATION: ECOG PERFORMANCE STATUS: 1 - Symptomatic but completely ambulatory  Vitals:   07/25/16 1254  BP: (!) 180/90  Pulse: 88  Resp: 18  Temp: 97.7 F (36.5 C)   Filed Weights   07/25/16 1254  Weight: 122 lb 3.2 oz (55.4 kg)    GENERAL:alert, no distress and comfortable SKIN: skin color, texture, turgor are normal, no rashes or significant lesions EYES: normal, conjunctiva are pink and non-injected, sclera clear OROPHARYNX:no exudate, no erythema and lips, buccal mucosa, and tongue normal  NECK: supple, thyroid normal size, non-tender, without nodularity LYMPH:  no palpable lymphadenopathy in the cervical, axillary or inguinal LUNGS: clear to auscultation and percussion with normal breathing effort HEART: regular rate & rhythm and no murmurs and no lower extremity edema ABDOMEN:abdomen soft, non-tender and normal bowel sounds Musculoskeletal:no cyanosis of  digits and no clubbing  PSYCH: alert & oriented x 3 with fluent speech NEURO: no focal motor/sensory deficits  LABORATORY DATA:  I have reviewed the data as listed Lab Results  Component Value Date   WBC 8.8 05/12/2016   HGB 10.2 (L) 05/12/2016   HCT 31.7 (L) 05/12/2016   MCV 91.4 05/12/2016   PLT 230 05/12/2016   Lab Results  Component Value Date   NA 137 05/12/2016   K 3.5 05/12/2016   CL 100 (L) 05/12/2016   CO2 29 05/12/2016    RADIOGRAPHIC STUDIES: I have personally reviewed the radiological reports and agreed with the findings in the report.  ASSESSMENT AND PLAN:  Malignant neoplasm of upper-outer quadrant of right breast in female, estrogen receptor positive ([place]) Right breast biopsy 10:30 position: IDC, lymphovascular invasion present, right axillary lymph node positive, grade 3, ER 90%, PR 60%, Ki-67 12%, HER-2 negative ratio 0.41; right breast mass 9 cm from nipple and 30 position: 1.7 x 1 x 1.6 cm, 2 abnormal lymph nodes right axilla largest 1.9 cm smaller 1.2 cm; T1c N1 stage II a  Pathology and radiology counseling: Discussed with the patient, the details of pathology including the type of breast cancer,the clinical staging, the significance of ER, PR and HER-2/neu receptors and the implications for treatment. After reviewing the pathology in detail, we proceeded to discuss the different treatment options between surgery, radiation, chemotherapy, antiestrogen therapies. I would like to obtain CT chest abdomen pelvis and bone scan for staging.  Recommendation: 1. Breast conserving surgery with targeted lymph node dissection 2. mammoprint testing 3. Systemic chemotherapy if it is high risk 4. Adjuvant radiation 5. Followed by adjuvant antiestrogen therapy  Return to clinic after surgery to discuss the final pathology report and to finalize adjuvant treatment plan.  All questions were answered. The patient knows to call the clinic with any problems, questions or  concerns.    Rulon Eisenmenger, MD 07/25/16

## 2016-07-25 NOTE — Assessment & Plan Note (Signed)
Right breast biopsy 10:30 position: IDC, lymphovascular invasion present, right axillary lymph node positive, grade 3, ER 90%, PR 60%, Ki-67 12%, HER-2 negative ratio 0.41; right breast mass 9 cm from nipple and 30 position: 1.7 x 1 x 1.6 cm, 2 abnormal lymph nodes right axilla largest 1.9 cm smaller 1.2 cm; T1c N1 stage II a  Pathology and radiology counseling: Discussed with the patient, the details of pathology including the type of breast cancer,the clinical staging, the significance of ER, PR and HER-2/neu receptors and the implications for treatment. After reviewing the pathology in detail, we proceeded to discuss the different treatment options between surgery, radiation, chemotherapy, antiestrogen therapies.  Recommendation: 1. Breast conserving surgery with targeted lymph node dissection 2. mammogram testing 3. Systemic chemotherapy if it is high risk 4. Adjuvant radiation 5. Followed by adjuvant antiestrogen therapy  Return to clinic after surgery to discuss the final pathology report and to finalize adjuvant treatment plan.

## 2016-07-28 ENCOUNTER — Telehealth: Payer: Self-pay | Admitting: *Deleted

## 2016-07-28 NOTE — Telephone Encounter (Signed)
Spoke with patient and informed her of her appointments for CT/bone scan for 08/01/16 at 1045am.  Patient verbalized understanding.

## 2016-07-29 ENCOUNTER — Encounter: Payer: Self-pay | Admitting: Radiation Oncology

## 2016-07-30 ENCOUNTER — Telehealth: Payer: Self-pay | Admitting: Hematology and Oncology

## 2016-07-30 NOTE — Telephone Encounter (Signed)
lvm to inform pt of 5/24 appt at 11 am per LOS

## 2016-08-01 ENCOUNTER — Encounter (HOSPITAL_COMMUNITY): Payer: PPO

## 2016-08-01 ENCOUNTER — Ambulatory Visit (HOSPITAL_COMMUNITY): Payer: PPO

## 2016-08-01 ENCOUNTER — Other Ambulatory Visit (HOSPITAL_COMMUNITY): Payer: PPO

## 2016-08-01 ENCOUNTER — Ambulatory Visit (HOSPITAL_COMMUNITY)
Admission: RE | Admit: 2016-08-01 | Discharge: 2016-08-01 | Disposition: A | Discharge status: Home / Self Care | Payer: PPO | Source: Ambulatory Visit | Attending: Hematology and Oncology | Admitting: Hematology and Oncology

## 2016-08-01 DIAGNOSIS — Z170 Estrogen receptor positive status [ER+]: Secondary | ICD-10-CM | POA: Diagnosis not present

## 2016-08-01 DIAGNOSIS — I700 Atherosclerosis of aorta: Secondary | ICD-10-CM | POA: Insufficient documentation

## 2016-08-01 DIAGNOSIS — K7689 Other specified diseases of liver: Secondary | ICD-10-CM | POA: Diagnosis not present

## 2016-08-01 DIAGNOSIS — J439 Emphysema, unspecified: Secondary | ICD-10-CM | POA: Insufficient documentation

## 2016-08-01 DIAGNOSIS — C50411 Malignant neoplasm of upper-outer quadrant of right female breast: Secondary | ICD-10-CM | POA: Diagnosis not present

## 2016-08-01 DIAGNOSIS — C50911 Malignant neoplasm of unspecified site of right female breast: Secondary | ICD-10-CM | POA: Diagnosis not present

## 2016-08-01 LAB — POCT I-STAT CREATININE: CREATININE: 1 mg/dL (ref 0.44–1.00)

## 2016-08-01 MED ORDER — IOPAMIDOL (ISOVUE-300) INJECTION 61%
INTRAVENOUS | Status: AC
  Administered 2016-08-01: 100 mL
  Filled 2016-08-01: qty 100

## 2016-08-04 ENCOUNTER — Ambulatory Visit: Payer: PPO | Admitting: Radiation Oncology

## 2016-08-04 ENCOUNTER — Ambulatory Visit: Payer: PPO

## 2016-08-05 ENCOUNTER — Ambulatory Visit: Payer: PPO | Admitting: Radiation Oncology

## 2016-08-05 ENCOUNTER — Other Ambulatory Visit: Payer: Self-pay | Admitting: Pulmonary Disease

## 2016-08-05 ENCOUNTER — Ambulatory Visit: Payer: PPO

## 2016-08-05 NOTE — Progress Notes (Signed)
Location of Breast Cancer: Right Breast  Histology per Pathology Report:  07/09/16 Diagnosis 1. Breast, right, needle core biopsy, 10:30 o'clock - INVASIVE DUCTAL CARCINOMA. - LYMPHOVASCULAR INVASION IS IDENTIFIED. - SEE COMMENT. 2. Lymph node, needle/core biopsy, right axilla - METASTATIC CARCINOMA IN 1 OF 1 LYMPH NODE (1/1).  Receptor Status: ER(90%), PR (60%), Her2-neu (NEG), Ki-(12%)  Did patient present with symptoms or was this found on screening mammography?: Dr. Lindi Adie documents 07/25/16: She felt a lump in the right breast about a year ago and thought it was benign. 6 months ago she found a lump in the right axilla. She did not think too much about it until recently. She then underwent a mammogram and ultrasound which revealed a mass in the right breast and axillary lymphadenopathy.   Past/Anticipated interventions by surgeon, if any: Dr. Barry Dienes 08/14/16 lumpectomy surgery scheduled.  Past/Anticipated interventions by medical oncology, if any:  07/25/16 Dr. Lindi Adie: Recommendation: 1. Breast conserving surgery with targeted lymph node dissection 2. mammoprint testing 3. Systemic chemotherapy if it is high risk 4. Adjuvant radiation 5. Followed by adjuvant antiestrogen therapy  She is currently taking anastrozole.   Lymphedema issues, if any:  N/A  Pain issues, if any: She has pain to her Left Hip related to surgery she just had in February due to fracture.    SAFETY ISSUES:  Prior radiation? Radioactive iodine to her thyroid early 1990's  Pacemaker/ICD? No  Possible current pregnancy? No  Is the patient on methotrexate? No  Current Complaints / other details:    BP (!) 150/90   Pulse 83   Temp 98.3 F (36.8 C)   Ht 5' 5"  (1.651 m)   Wt 120 lb 12.8 oz (54.8 kg)   SpO2 98% Comment: room air  BMI 20.10 kg/m   Wt Readings from Last 3 Encounters:  08/12/16 120 lb 12.8 oz (54.8 kg)  08/07/16 122 lb 8 oz (55.6 kg)  07/25/16 122 lb 3.2 oz (55.4 kg)       Dianelly Ferran, Stephani Police, RN 08/05/2016,3:42 PM

## 2016-08-06 ENCOUNTER — Ambulatory Visit (HOSPITAL_COMMUNITY): Payer: PPO

## 2016-08-06 ENCOUNTER — Ambulatory Visit (HOSPITAL_COMMUNITY)
Admission: RE | Admit: 2016-08-06 | Discharge: 2016-08-06 | Disposition: A | Discharge status: Home / Self Care | Payer: PPO | Source: Ambulatory Visit | Attending: Hematology and Oncology | Admitting: Hematology and Oncology

## 2016-08-06 ENCOUNTER — Other Ambulatory Visit (HOSPITAL_COMMUNITY): Payer: PPO

## 2016-08-06 ENCOUNTER — Encounter (HOSPITAL_COMMUNITY): Payer: PPO

## 2016-08-06 DIAGNOSIS — M5136 Other intervertebral disc degeneration, lumbar region: Secondary | ICD-10-CM | POA: Insufficient documentation

## 2016-08-06 DIAGNOSIS — C50411 Malignant neoplasm of upper-outer quadrant of right female breast: Secondary | ICD-10-CM | POA: Diagnosis not present

## 2016-08-06 DIAGNOSIS — Z170 Estrogen receptor positive status [ER+]: Secondary | ICD-10-CM | POA: Diagnosis not present

## 2016-08-06 DIAGNOSIS — C50911 Malignant neoplasm of unspecified site of right female breast: Secondary | ICD-10-CM | POA: Diagnosis not present

## 2016-08-06 MED ORDER — TECHNETIUM TC 99M MEDRONATE IV KIT
25.0000 | PACK | Freq: Once | INTRAVENOUS | Status: AC | PRN
  Administered 2016-08-06: 25 via INTRAVENOUS

## 2016-08-07 ENCOUNTER — Encounter (HOSPITAL_COMMUNITY): Payer: Self-pay

## 2016-08-07 ENCOUNTER — Encounter (HOSPITAL_COMMUNITY)
Admission: RE | Admit: 2016-08-07 | Discharge: 2016-08-07 | Disposition: A | Discharge status: Home / Self Care | Payer: PPO | Source: Ambulatory Visit | Attending: General Surgery | Admitting: General Surgery

## 2016-08-07 DIAGNOSIS — C50911 Malignant neoplasm of unspecified site of right female breast: Secondary | ICD-10-CM | POA: Diagnosis not present

## 2016-08-07 HISTORY — DX: Malignant (primary) neoplasm, unspecified: C80.1

## 2016-08-07 LAB — BASIC METABOLIC PANEL
ANION GAP: 10 (ref 5–15)
BUN: 17 mg/dL (ref 6–20)
CALCIUM: 9.3 mg/dL (ref 8.9–10.3)
CO2: 25 mmol/L (ref 22–32)
CREATININE: 0.97 mg/dL (ref 0.44–1.00)
Chloride: 103 mmol/L (ref 101–111)
GFR, EST NON AFRICAN AMERICAN: 57 mL/min — AB (ref >60)
Glucose, Bld: 121 mg/dL — ABNORMAL HIGH (ref 65–99)
Potassium: 3.7 mmol/L (ref 3.5–5.1)
SODIUM: 138 mmol/L (ref 135–145)

## 2016-08-07 LAB — CBC
HEMATOCRIT: 43.4 % (ref 36.0–46.0)
Hemoglobin: 13.9 g/dL (ref 12.0–15.0)
MCH: 29.1 pg (ref 26.0–34.0)
MCHC: 32 g/dL (ref 30.0–36.0)
MCV: 91 fL (ref 78.0–100.0)
PLATELETS: 279 10*3/uL (ref 150–400)
RBC: 4.77 MIL/uL (ref 3.87–5.11)
RDW: 14.1 % (ref 11.5–15.5)
WBC: 8.7 10*3/uL (ref 4.0–10.5)

## 2016-08-07 NOTE — Pre-Procedure Instructions (Signed)
   Linda [name]  08/07/2016      [id number] - [place], [place] - [place] [id number] Phone: [phone] Fax: [facility] [address], [place] - [address] [place] [place] [address] Phone: [phone] Fax: [facility] [address] [place] [address] Phone: [phone] Fax: [phone]    Your procedure is scheduled on Thursday, May 17th   Report to [facility] Admitting at  5:30 AM             (posted surgery time 7:30 - 9:30 am).   Call this number if you have problems the morning of surgery:  [phone], for other questions, call [phone] Mon-Fri from 8-4:30pm    Remember:  Do not eat food or drink liquids after midnight, Wednesday.   Take these medicines the morning of surgery with A SIP OF WATER : Amlodipine,  Levothyroxine, Omeprazole.  Please use your inhaler/s that morning.              4-5 days prior to surgery, STOP TAKING any Vitamins, Herbal Supplements, Anti-inflammatories, Blood Thinners.   Do not wear jewelry, make-up or nail polish.  Do not wear lotions, powders,  perfumes, or deoderant.  Do not shave 48 hours prior to surgery.     Do not bring valuables to the hospital.  [facility] is not responsible for any belongings or valuables.  Contacts, dentures or bridgework may not be worn into surgery.  Leave your suitcase in the car.  After surgery it may be brought to your room. For patients admitted to the hospital, discharge time will be determined by your treatment team.  Patients discharged the day of surgery will not be allowed to drive home.  You will need someone to stay with you for the first 24 hrs after surgery.  Please read over the following fact sheets that you were given. Pain Booklet and Surgical Site  Infection Prevention

## 2016-08-07 NOTE — Progress Notes (Signed)
PCP IS Dr. Iven Finn   Unsure of when she saw her last. Pulmonary is Dr. Bernita Raisin.  LOV 03/2016 She states that she was told back in the late 90's that when she goes to surgery, she needs a pediatric endotracheal tube.  She recently had hip surgery, but that was under spinal.  AUlice Brilliant PA will interview. The is wording in history and from the patient that a "lump" is to be removed from her right arm also. I called office for clarification.

## 2016-08-07 NOTE — Progress Notes (Signed)
Anesthesia PAT Evaluation: Patient is a 71 year old female scheduled for right breast lumpectomy with axillary LN dissection, excision right arm mass on 08/14/16 by Dr. Barry Dienes. Case is posted for general anesthesia. (Arm mass if felt to be a lipoma per patient.)  History includes former smoker (quit '08), HTN, COPD Gold D, GERD, HLD, asthma, recurrent PNA, hypothyroidism (s/p radioactive iodine thyroid ablation), vocal cord surgery (for polyps > 40 years ago), migraines, arthritis, appendectomy '63, tonsillectomy, bilateral inguinal hernia repair '00, ankle surgery X 2, hysterectomy (due to bleeding) '79, left hip femoral neck fracture (following fall on exercise machine) s/p left THA 05/09/16. For anesthesia history, she reported that around '99 she had issues when an adult ETT was used and was told she should always have a pediatric tube if GETA needed. She thinks the surgery was at [facility].   - PCP is Dr. Ernestene Kiel in [place]. - Pulmonologist is Dr. Marshell Garfinkel, last visit 04/29/16. She had not had any COPD exacerbations since starting Daliresp. She was involved in an exercise program with a personal trainer. She reports breathing tends to be worse with pollen and stress. Mornings she will have more secretions, but able to expectorate after her pulmonary meds/inhalers. She will have periods of the day when she is not breathing as well (ie mornings), but overall has noticed a big improvement since starting Daliresp and switching from [place] to [place]. She also has breathing exercises that help. She has not had any recent exacerbations. No home O2 use.  - HEM-ONC is Dr. Nicholas Lose.  Meds include albuterol, amlodipine, anastrozole, Symbicort, Daliresp, levothyroxine, losartan, Dulera, Prilosec, Zegerid OTC, pravastatin.  BP (!) 156/75   Pulse 94   Temp 36.7 C   Resp 18   Ht 5\' 4"  (1.626 m)   Wt 122 lb 8 oz (55.6 kg)   SpO2 97%   BMI 21.03 kg/m  She is back at the gym for ~ 1 hour 2-3  X/week since her hip surgery. She has had some soreness below both axilla, but positional and/or sore to touch. No symptoms to suggest exertional chest pain. No syncope.  Heart RRR with rare ectopic beat, no murmur noted. Lungs were diminished, but clear. No wheezing or rhonchi noted. No ankle edema. No carotid bruits noted.    EKG 05/08/16: SR, biatrial enlargement, minimal ST depression, inferior leads. ST changes appear non-specific.   1V CXR 05/08/16:    IMPRESSION: Emphysema.  No acute cardiopulmonary abnormality.  CT chest/abd/pelvis 08/01/16: IMPRESSION: 1. Known right breast cancer with right axillary and subpectoral nodal involvement. No other adenopathy demonstrated. 2. No evidence of distant metastatic disease. 3. No suspicious abdominopelvic findings. Small hepatic cyst and stable mild left adrenal nodularity. 4. Aortic Atherosclerosis (ICD10-I70.0) and Emphysema (ICD10-J43.9).  PFTs 08/23/15 FVC 2.14 [71%] FEV1 0.94 [41%] F/F 44 TLC 114% DLCO 57% Severe obstructive defect with moderate reduction in diffusion capacity  Preoperative labs noted.   She is anxious about surgery and anesthesia. Concerned about her COPD and fear of breast cancer and potential need for ETT. She reports that Dr. Barry Dienes thought she may be able to have LMA. We discussed that her anesthesiologist would have to help determine what he/she felt was the safest anesthesia plan, but that if ETT were needed that there was advanced airway equipment available. She was advised to continue compliance with pulmonary regimen and that she should be at her baseline for surgery to lessen pulmonary risk with surgery and anesthesia. I will attempt to see  if anesthesia records from ~ '99 or '00 can be obtained and will update my note if any new records.  George Hugh [facility]/Anesthesiology Phone [phone] 08/07/2016 5:24 PM

## 2016-08-07 NOTE — Anesthesia Preprocedure Evaluation (Addendum)
Anesthesia Evaluation  Patient identified by MRN, date of birth, ID band Patient awake    Reviewed: Allergy & Precautions, NPO status , Patient's Chart, lab work & pertinent test results  Airway Mallampati: II  TM Distance: >3 FB Neck ROM: Full    Dental  (+) Teeth Intact, Dental Advisory Given   Pulmonary former smoker,    breath sounds clear to auscultation       Cardiovascular hypertension,  Rhythm:Regular Rate:Normal     Neuro/Psych    GI/Hepatic   Endo/Other    Renal/GU      Musculoskeletal   Abdominal   Peds  Hematology   Anesthesia Other Findings   Reproductive/Obstetrics                            Anesthesia Physical Anesthesia Plan  ASA: II  Anesthesia Plan: General and Regional   Post-op Pain Management:    Induction: Intravenous  Airway Management Planned: LMA  Additional Equipment:   Intra-op Plan:   Post-operative Plan:   Informed Consent: I have reviewed the patients History and Physical, chart, labs and discussed the procedure including the risks, benefits and alternatives for the proposed anesthesia with the patient or authorized representative who has indicated his/her understanding and acceptance.   Dental advisory given  Plan Discussed with: CRNA and Anesthesiologist  Anesthesia Plan Comments: (See my anesthesia note. Myra Gianotti, PA-C)       Anesthesia Quick Evaluation

## 2016-08-12 ENCOUNTER — Ambulatory Visit
Admission: RE | Admit: 2016-08-12 | Discharge: 2016-08-12 | Disposition: A | Discharge status: Home / Self Care | Payer: PPO | Source: Ambulatory Visit | Attending: Radiation Oncology | Admitting: Radiation Oncology

## 2016-08-12 ENCOUNTER — Ambulatory Visit
Admission: RE | Admit: 2016-08-12 | Discharge: 2016-08-12 | Disposition: A | Discharge status: Home / Self Care | Payer: PPO | Source: Ambulatory Visit

## 2016-08-12 ENCOUNTER — Encounter: Payer: Self-pay | Admitting: Radiation Oncology

## 2016-08-12 VITALS — BP 150/90 | HR 83 | Temp 98.3°F | Ht 65.0 in | Wt 120.8 lb

## 2016-08-12 DIAGNOSIS — J449 Chronic obstructive pulmonary disease, unspecified: Secondary | ICD-10-CM | POA: Insufficient documentation

## 2016-08-12 DIAGNOSIS — M8580 Other specified disorders of bone density and structure, unspecified site: Secondary | ICD-10-CM | POA: Diagnosis not present

## 2016-08-12 DIAGNOSIS — Z87891 Personal history of nicotine dependence: Secondary | ICD-10-CM | POA: Insufficient documentation

## 2016-08-12 DIAGNOSIS — E785 Hyperlipidemia, unspecified: Secondary | ICD-10-CM | POA: Insufficient documentation

## 2016-08-12 DIAGNOSIS — E039 Hypothyroidism, unspecified: Secondary | ICD-10-CM | POA: Diagnosis not present

## 2016-08-12 DIAGNOSIS — K219 Gastro-esophageal reflux disease without esophagitis: Secondary | ICD-10-CM | POA: Diagnosis not present

## 2016-08-12 DIAGNOSIS — I10 Essential (primary) hypertension: Secondary | ICD-10-CM | POA: Insufficient documentation

## 2016-08-12 DIAGNOSIS — Z96642 Presence of left artificial hip joint: Secondary | ICD-10-CM | POA: Insufficient documentation

## 2016-08-12 DIAGNOSIS — Z886 Allergy status to analgesic agent status: Secondary | ICD-10-CM | POA: Insufficient documentation

## 2016-08-12 DIAGNOSIS — Z79811 Long term (current) use of aromatase inhibitors: Secondary | ICD-10-CM | POA: Insufficient documentation

## 2016-08-12 DIAGNOSIS — Z170 Estrogen receptor positive status [ER+]: Secondary | ICD-10-CM | POA: Diagnosis not present

## 2016-08-12 DIAGNOSIS — Z823 Family history of stroke: Secondary | ICD-10-CM | POA: Diagnosis not present

## 2016-08-12 DIAGNOSIS — C773 Secondary and unspecified malignant neoplasm of axilla and upper limb lymph nodes: Secondary | ICD-10-CM | POA: Insufficient documentation

## 2016-08-12 DIAGNOSIS — Z510 Encounter for antineoplastic radiation therapy: Secondary | ICD-10-CM | POA: Insufficient documentation

## 2016-08-12 DIAGNOSIS — Z7951 Long term (current) use of inhaled steroids: Secondary | ICD-10-CM | POA: Diagnosis not present

## 2016-08-12 DIAGNOSIS — M1990 Unspecified osteoarthritis, unspecified site: Secondary | ICD-10-CM | POA: Diagnosis not present

## 2016-08-12 DIAGNOSIS — Z8249 Family history of ischemic heart disease and other diseases of the circulatory system: Secondary | ICD-10-CM | POA: Insufficient documentation

## 2016-08-12 DIAGNOSIS — Z90710 Acquired absence of both cervix and uterus: Secondary | ICD-10-CM | POA: Insufficient documentation

## 2016-08-12 DIAGNOSIS — C50411 Malignant neoplasm of upper-outer quadrant of right female breast: Secondary | ICD-10-CM | POA: Insufficient documentation

## 2016-08-12 DIAGNOSIS — Z79899 Other long term (current) drug therapy: Secondary | ICD-10-CM | POA: Diagnosis not present

## 2016-08-12 DIAGNOSIS — C778 Secondary and unspecified malignant neoplasm of lymph nodes of multiple regions: Secondary | ICD-10-CM | POA: Diagnosis not present

## 2016-08-12 DIAGNOSIS — G43909 Migraine, unspecified, not intractable, without status migrainosus: Secondary | ICD-10-CM | POA: Insufficient documentation

## 2016-08-12 NOTE — Progress Notes (Signed)
Radiation Oncology         ([phone]) [phone] ________________________________  Initial outpatient Consultation  Name: Linda [name] MRN: [id number]  Date: 08/12/2016  DOB: Jul 10, 1945  UL:AGTXMIWO, Chrys Racer, MD  Nicholas Lose, MD   REFERRING PHYSICIAN: Nicholas Lose, MD  DIAGNOSIS:    ICD-9-CM ICD-10-CM   1. Malignant neoplasm of upper-outer quadrant of right breast in female, estrogen receptor positive ([place]) 174.4 C50.411    V86.0 Z17.0    Stage T1cN1M0 Right Breast UOQ Invasive Ductal Carcinoma, ER 90% / PR 60% / Her2-neu NEG, Grade 3  CHIEF COMPLAINT: Here to discuss management of right breast cancer  HISTORY OF PRESENT ILLNESS::Linda [name] is a 71 y.o. female who felt a lump in the right breast about a year ago but thought this was benign. She felt a lump in her right axilla about 6 months later, but continued to be unconcerned. In April 2018, she felt compelled to undergo a mammogram which revealed a 1.8 cm irregular mass in the upper outer quadrant of the right breast at the more inferior palpable site. There were associated pleomorphic calcifications in a linear configuration. The second palpable site in the right axilla demonstrated a large abnormal appearing rounded lymph node.   An ultrasound targeted to right breast at 10:30, 9 cm from the nipple demonstrated an irregular hypoechoic mass measuring 1.7 x 1.0 x 1.6 cm. Doppler confirmed blood flow. There were also 2 abnormal appearing lymph nodes in the right axilla, the largest of which measured 1.9 cm. A smaller 1.2 cm node with an effaced hilum was noted adjacent to larger node.   Biopsy showed mass and suspicious lymph node were positive for invasive ductal carcinoma, grade 3 with micropapillary features and other characteristics as described above in the diagnosis.  Pt met with Dr. Lindi Adie on 07/25/16 and it was recommended that patient undergo breast conserving surgery , Mammaprint testing (with systemic chemotherapy if high  risk), adjuvant radiation, and antiestrogen therapy thereafter. I spoke w/ Dr Barry Dienes; she will perform ALND at surgery.    CT CAP staging was performed on 08/01/16. This confirmed known right breast cancer with right axillary and subpectoral nodal involvement. No other adenopathy was appreciated. There was no evidence of distant metastatic disease and no suspicious abdominopelvic findings.   Whole body bone scan on 08/06/2016 revealed no definite scintigraphic evidence of osseous metastatic disease.  She has a lumpectomy scheduled with Dr. Barry Dienes on 08/14/16.  She will meet with Dr. Lindi Adie on 08/20/16 to discuss results of Mammaprint test.  On review of systems, patient states she has not smoked cigarettes since yesterday, citing the advice of Dr. Janace Hoard. She is unsure if she will quit for good, but does state she was able to quit for several years in the 80s "cold [place]."     PREVIOUS RADIATION THERAPY: Yes radioactive iodine to thyroid in early 1990s  PAST MEDICAL HISTORY:  has a past medical history of Arthritis; Asthma; Breast mass (07/03/2016); Cancer ([place]); Complication of anesthesia; COPD (chronic obstructive pulmonary disease) ([place]); GERD (gastroesophageal reflux disease); History of hiatal hernia; Hyperglycemia; Hyperlipidemia; Hypertension; Hypothyroidism; Lump of breast, right; Migraine; Osteopenia; Pneumonia ("several times"); Status post radioactive iodine thyroid ablation; and Thyroid disease.    PAST SURGICAL HISTORY: Past Surgical History:  Procedure Laterality Date  . 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 APPENDECTOMY  1963  . DILATION AND CURETTAGE OF UTERUS  "several"   "when I was young"  .  INGUINAL HERNIA REPAIR Bilateral 2000  . POLYPECTOMY     "vocal cord"  . TONSILLECTOMY    . TOTAL HIP ARTHROPLASTY Left 05/09/2016   Procedure: TOTAL HIP ARTHROPLASTY ANTERIOR APPROACH;  Surgeon: Paralee Cancel, MD;  Location: [facility];  Service: Orthopedics;   Laterality: Left;  . TUBAL LIGATION    . VAGINAL HYSTERECTOMY  1979    FAMILY HISTORY: family history includes Allergies in her mother; Heart attack in her mother; Hypertension in her father and mother; Stroke in her mother.  SOCIAL HISTORY:  reports that she quit smoking about 10 years ago. Her smoking use included Cigarettes. She has a 25.00 pack-year smoking history. She has never used smokeless tobacco. She reports that she drinks alcohol. She reports that she does not use drugs.  ALLERGIES: Aspirin  MEDICATIONS:  Current Outpatient Prescriptions  Medication Sig Dispense Refill  . albuterol (PROVENTIL HFA;VENTOLIN HFA) 108 (90 Base) MCG/ACT inhaler Inhale 1-2 puffs into the lungs every 6 (six) hours as needed for wheezing or shortness of breath.    Marland Kitchen amLODipine (NORVASC) 5 MG tablet Take 5 mg by mouth daily before breakfast.     . anastrozole (ARIMIDEX) 1 MG tablet Take 1 tablet (1 mg total) by mouth daily. (Patient taking differently: Take 1 mg by mouth daily after breakfast. ) 30 tablet 0  . budesonide-formoterol (SYMBICORT) 160-4.5 MCG/ACT inhaler Inhale 2 puffs into the lungs 2 (two) times daily.    . Cholecalciferol (VITAMIN D3) 2000 units capsule Take 2,000 Units by mouth every 14 (fourteen) days. Once every 2-3 weeks (when she remembers)    . Coenzyme Q10 (COQ10) 200 MG CAPS Take 200 mg by mouth every 14 (fourteen) days. Once every 2-3 weeks (when she remembers)    . DALIRESP 500 MCG TABS tablet TAKE ONE TABLET BY MOUTH DAILY (Patient taking differently: TAKE ONE TABLET BY MOUTH DAILY AFTER BREAKFAST) 30 tablet 0  . DALIRESP 500 MCG TABS tablet TAKE ONE TABLET BY MOUTH ONCE DAILY 30 tablet 5  . levothyroxine (SYNTHROID, LEVOTHROID) 88 MCG tablet Take 88 mcg by mouth daily before breakfast.     . losartan (COZAAR) 100 MG tablet Take 100 mg by mouth daily before breakfast.     . meloxicam (MOBIC) 15 MG tablet Take 15 mg by mouth daily before breakfast.     . mometasone-formoterol  (DULERA) 200-5 MCG/ACT AERO Inhale 2 puffs into the lungs 2 (two) times daily.    Marland Kitchen omeprazole (PRILOSEC) 20 MG capsule Take 20 mg by mouth daily before breakfast.     . Omeprazole-Sodium Bicarbonate (ZEGERID OTC) 20-1100 MG CAPS capsule Take 1 capsule by mouth daily before breakfast.    . polyethylene glycol (MIRALAX / GLYCOLAX) packet Take 17 g by mouth 2 (two) times daily. (Patient taking differently: Take 17 g by mouth daily as needed (for constipation.). ) 14 each 0  . pravastatin (PRAVACHOL) 20 MG tablet Take 20 mg by mouth daily before breakfast.     . SPIRIVA RESPIMAT 2.5 MCG/ACT AERS Inhale 2 puffs into the lungs daily after breakfast.     No current facility-administered medications for this encounter.     REVIEW OF SYSTEMS: A 10+ POINT REVIEW OF SYSTEMS WAS OBTAINED including neurology, dermatology, psychiatry, cardiac, respiratory, lymph, extremities, GI, GU, Musculoskeletal, constitutional, breasts, reproductive, HEENT.  All pertinent positives are noted in the HPI.  All others are negative.   PHYSICAL EXAM:  height is 5' 5"  (1.651 m) and weight is 120 lb 12.8 oz (54.8 kg). Her  temperature is 98.3 F (36.8 C). Her blood pressure is 150/90 (abnormal) and her pulse is 83. Her oxygen saturation is 98%.   General: Alert and oriented, in no acute distress HEENT: Head is normocephalic. Extraocular movements are intact. Oropharynx is clear. Neck: Neck is supple, no palpable cervical or supraclavicular lymphadenopathy. Heart: Regular in rate and rhythm with no murmurs, rubs, or gallops. Chest: Clear to auscultation bilaterally, with no rhonchi, wheezes, or rales. Abdomen: Soft, nontender, nondistended, with no rigidity or guarding. Extremities: No cyanosis or edema. The right triceps has a soft mass of 2.5 cm. This feels benign but cannot rule out malignancy. Lymphatics: see Neck Exam Skin: No concerning lesions. Musculoskeletal: symmetric strength and muscle tone throughout. Neurologic:  Cranial nerves II through XII are grossly intact. No obvious focalities. Speech is fluent. Coordination is intact. Psychiatric: Judgment and insight are intact. Affect is appropriate. Breasts: Right breast has no obvious palpable tumor. Right axilla has palpable node about 1.5 cm in greatest dimension. The right triceps has soft mass that is 2.5 cm, this feels benign but cannot rule out malignancy No other palpable masses appreciated in the breasts or axillae.    ECOG = 1  0 - Asymptomatic (Fully active, able to carry on all predisease activities without restriction)  1 - Symptomatic but completely ambulatory (Restricted in physically strenuous activity but ambulatory and able to carry out work of a light or sedentary nature. For example, light housework, office work)  2 - Symptomatic, <50% in bed during the day (Ambulatory and capable of all self care but unable to carry out any work activities. Up and about more than 50% of waking hours)  3 - Symptomatic, >50% in bed, but not bedbound (Capable of only limited self-care, confined to bed or chair 50% or more of waking hours)  4 - Bedbound (Completely disabled. Cannot carry on any self-care. Totally confined to bed or chair)  5 - Death   Eustace Pen MM, Creech RH, Tormey DC, et al. [phone]). "Toxicity and response criteria of the [facility]". [place] Oncol. 5 (6): 649-55   LABORATORY DATA:  Lab Results  Component Value Date   WBC 8.7 08/07/2016   HGB 13.9 08/07/2016   HCT 43.4 08/07/2016   MCV 91.0 08/07/2016   PLT 279 08/07/2016   CMP     Component Value Date/Time   NA 138 08/07/2016 1430   K 3.7 08/07/2016 1430   CL 103 08/07/2016 1430   CO2 25 08/07/2016 1430   GLUCOSE 121 (H) 08/07/2016 1430   BUN 17 08/07/2016 1430   CREATININE 0.97 08/07/2016 1430   CALCIUM 9.3 08/07/2016 1430   PROT 6.0 (L) 05/09/2016 0523   ALBUMIN 3.7 05/09/2016 0523   AST 23 05/09/2016 0523   ALT 18 05/09/2016 0523   ALKPHOS  63 05/09/2016 0523   BILITOT 0.7 05/09/2016 0523   GFRNONAA 57 (L) 08/07/2016 1430   GFRAA >60 08/07/2016 1430         RADIOGRAPHY: Ct Chest W Contrast  Result Date: 08/01/2016 CLINICAL DATA:  Recent diagnosis of right breast cancer.  Nausea. EXAM: CT CHEST, ABDOMEN, AND PELVIS WITH CONTRAST TECHNIQUE: Multidetector CT imaging of the chest, abdomen and pelvis was performed following the standard protocol during bolus administration of intravenous contrast. CONTRAST:  141m ISOVUE-300 IOPAMIDOL (ISOVUE-300) INJECTION 61% COMPARISON:  Chest CT 02/26/2015. FINDINGS: CT CHEST FINDINGS Cardiovascular: Atherosclerosis of the aorta, great vessels and coronary arteries. No acute vascular findings. The heart size is normal.  There is no pericardial effusion. Mediastinum/Nodes: There are no enlarged mediastinal, hilar or internal mammary lymph nodes. Mildly prominent right hilar node on image 76 appears stable. There are new asymmetrically enlarged right axillary and subpectoral lymph nodes, measuring 10 mm on image 51 and in 8 mm on image 41, respectively. Small hiatal hernia. Lungs/Pleura: There is no pleural effusion. At least moderate centrilobular and paraseptal emphysema. There are no suspicious pulmonary nodules. Chronic scarring in the right lower lobe appears unchanged. Musculoskeletal/Chest wall: Biopsy clip noted laterally in the right breast. No evidence of chest wall invasion or worrisome osseous finding. Small right cervical rib. CT ABDOMEN AND PELVIS FINDINGS Hepatobiliary: There is a stable subcapsular cyst in the medial segment of the left hepatic lobe, measuring 9 mm on image 143. There are no worrisome hepatic findings. No evidence of gallstones, gallbladder wall thickening or biliary dilatation. Pancreas: Unremarkable. No pancreatic ductal dilatation or surrounding inflammatory changes. Spleen: Normal in size without focal abnormality. Adrenals/Urinary Tract: There is stable nodularity of both  limbs of the left adrenal gland, measuring 14 mm medially on image 156 and 13 mm laterally on image 164. The right adrenal gland appears normal. The kidneys appear normal without evidence of urinary tract calculus, suspicious lesion or hydronephrosis. No bladder abnormalities are seen. Stomach/Bowel: No evidence of bowel wall thickening, distention or surrounding inflammatory change. Vascular/Lymphatic: There are no enlarged abdominal or pelvic lymph nodes. Mild aortic and branch vessel atherosclerosis. No evidence of large vessel occlusion. Reproductive: Hysterectomy. No evidence of adnexal mass. Probable residual ovarian tissue bilaterally appear Other: The anterior abdominal wall appears normal. No ascites or peritoneal nodularity. Musculoskeletal: No acute or significant osseous findings. Prominent endplate degeneration at L1-2. Transitional L5 segment. IMPRESSION: 1. Known right breast cancer with right axillary and subpectoral nodal involvement. No other adenopathy demonstrated. 2. No evidence of distant metastatic disease. 3. No suspicious abdominopelvic findings. Small hepatic cyst and stable mild left adrenal nodularity. 4. Aortic Atherosclerosis (ICD10-I70.0) and Emphysema (ICD10-J43.9). Electronically Signed   By: Richardean Sale M.D.   On: 08/01/2016 17:36   Nm Bone Scan Whole Body  Result Date: 08/06/2016 CLINICAL DATA:  RIGHT breast cancer with positive lymph nodes, pretreatment, history of LEFT hip fracture with THR 6 months ago, prior RIGHT ankle surgery EXAM: NUCLEAR MEDICINE WHOLE BODY BONE SCAN TECHNIQUE: Whole body anterior and posterior images were obtained approximately 3 hours after intravenous injection of radiopharmaceutical. RADIOPHARMACEUTICALS:  20.9 mCi Technetium-53mMDP IV COMPARISON:  None Correlation: CT chest abdomen pelvis 08/01/2016 FINDINGS: Photopenic defect at LEFT hip consistent with LEFT hip prosthesis. Mild uptake of tracer is seen surrounding the femoral and acetabular  components of LEFT hip prosthesis, nonspecific, may be related to recent placement. Uptake at the distal RIGHT tibia and fibula compatible with history of prior trauma and surgery. Uptake in the lumbar spine at approximately L2, nonspecific ; this corresponds to significant degenerative disc disease changes on prior CT. No additional sites of abnormal osseous tracer accumulation identified to suggest osseous metastatic disease. Scattered articular type uptake at multiple joints likely reflects mild degenerative changes. Expected urinary tract and soft tissue distribution of tracer. IMPRESSION: Uptake in lumbar spine at approximately L2, nonspecific but corresponding to degenerative disc disease changes since prior CT. Uptake surrounding LEFT hip prosthesis likely reflecting recent surgery. Uptake distal RIGHT tibia and fibula corresponding to prior surgery. No definite scintigraphic evidence of osseous metastatic disease. Electronically Signed   By: MLavonia DanaM.D.   On: 08/06/2016 15:26   Ct  Abdomen Pelvis W Contrast  Result Date: 08/01/2016 CLINICAL DATA:  Recent diagnosis of right breast cancer.  Nausea. EXAM: CT CHEST, ABDOMEN, AND PELVIS WITH CONTRAST TECHNIQUE: Multidetector CT imaging of the chest, abdomen and pelvis was performed following the standard protocol during bolus administration of intravenous contrast. CONTRAST:  161m ISOVUE-300 IOPAMIDOL (ISOVUE-300) INJECTION 61% COMPARISON:  Chest CT 02/26/2015. FINDINGS: CT CHEST FINDINGS Cardiovascular: Atherosclerosis of the aorta, great vessels and coronary arteries. No acute vascular findings. The heart size is normal. There is no pericardial effusion. Mediastinum/Nodes: There are no enlarged mediastinal, hilar or internal mammary lymph nodes. Mildly prominent right hilar node on image 76 appears stable. There are new asymmetrically enlarged right axillary and subpectoral lymph nodes, measuring 10 mm on image 51 and in 8 mm on image 41, respectively.  Small hiatal hernia. Lungs/Pleura: There is no pleural effusion. At least moderate centrilobular and paraseptal emphysema. There are no suspicious pulmonary nodules. Chronic scarring in the right lower lobe appears unchanged. Musculoskeletal/Chest wall: Biopsy clip noted laterally in the right breast. No evidence of chest wall invasion or worrisome osseous finding. Small right cervical rib. CT ABDOMEN AND PELVIS FINDINGS Hepatobiliary: There is a stable subcapsular cyst in the medial segment of the left hepatic lobe, measuring 9 mm on image 143. There are no worrisome hepatic findings. No evidence of gallstones, gallbladder wall thickening or biliary dilatation. Pancreas: Unremarkable. No pancreatic ductal dilatation or surrounding inflammatory changes. Spleen: Normal in size without focal abnormality. Adrenals/Urinary Tract: There is stable nodularity of both limbs of the left adrenal gland, measuring 14 mm medially on image 156 and 13 mm laterally on image 164. The right adrenal gland appears normal. The kidneys appear normal without evidence of urinary tract calculus, suspicious lesion or hydronephrosis. No bladder abnormalities are seen. Stomach/Bowel: No evidence of bowel wall thickening, distention or surrounding inflammatory change. Vascular/Lymphatic: There are no enlarged abdominal or pelvic lymph nodes. Mild aortic and branch vessel atherosclerosis. No evidence of large vessel occlusion. Reproductive: Hysterectomy. No evidence of adnexal mass. Probable residual ovarian tissue bilaterally appear Other: The anterior abdominal wall appears normal. No ascites or peritoneal nodularity. Musculoskeletal: No acute or significant osseous findings. Prominent endplate degeneration at L1-2. Transitional L5 segment. IMPRESSION: 1. Known right breast cancer with right axillary and subpectoral nodal involvement. No other adenopathy demonstrated. 2. No evidence of distant metastatic disease. 3. No suspicious  abdominopelvic findings. Small hepatic cyst and stable mild left adrenal nodularity. 4. Aortic Atherosclerosis (ICD10-I70.0) and Emphysema (ICD10-J43.9). Electronically Signed   By: WRichardean SaleM.D.   On: 08/01/2016 17:36      IMPRESSION/PLAN: Right breast cancer, node positive.  It was a pleasure meeting the patient today. The team consensus is that she would be a good candidate for breast conservation. I talked to her about the option of a mastectomy and informed her that her expected overall survival would be equivalent between mastectomy and breast conservation, based upon randomized controlled data. She is enthusiastic about breast conservation.  We discussed the risks, benefits, and side effects of radiotherapy. I recommend radiotherapy to the right breast and regional lymph nodes to reduce her risk of locoregional recurrence by 2/3.  We discussed that radiation would take approximately 5-6 weeks to complete.  On imaging she appears to have axillary and subpectoral lymph node involvement. I explained to her that I will see her once she is cleared by medical oncology. Her disposition for chemotherapy is still pending. If chemotherapy were to be given, this would precede radiotherapy.  I palpated a mass on triceps that feels benign and noted this in a a phone call and electronic message to Dr. Barry Dienes in case this influences what tissue she targets during surgery. I  also noted subpectoral nodes shown on her CT scan for similar reasons to Dr Barry Dienes.  We spoke about acute effects including skin irritation and fatigue as well as much less common late effects including internal organ injury or irritation. We spoke about the latest technology that is used to minimize the risk of late effects for patients undergoing radiotherapy to the breast or chest wall. No guarantees of treatment were given. The patient is enthusiastic about proceeding with treatment. I provided a consent form which the patient  signed. This will be placed in her chart. I look forward to participating in the patient's care.  I will await her referral back to me for postoperative follow-up and eventual CT simulation/treatment planning.  I asked the patient today about tobacco use. The patient uses tobacco.  I advised the patient to quit. Services were offered by me today including outpatient counseling and pharmacotherapy. I assessed for the willingness to attempt to quit and provided encouragement and demonstrated willingness to make referrals and/or prescriptions to help the patient attempt to quit. The patient has follow-up with the oncologic team to touch base on their tobacco use and /or cessation efforts.  Over 3 minutes were spent on this issue. She was not interested in declaring a specific quit date.   __________________________________________   Eppie Gibson, MD  This document serves as a record of services personally performed by Eppie Gibson, MD. It was created on his behalf by Linward Natal, a trained medical scribe. The creation of this record is based on the scribe's personal observations and the provider's statements to them. This document has been checked and approved by the attending provider.

## 2016-08-14 ENCOUNTER — Encounter (HOSPITAL_COMMUNITY): Payer: Self-pay | Admitting: *Deleted

## 2016-08-14 ENCOUNTER — Encounter (HOSPITAL_COMMUNITY)
Admission: RE | Disposition: A | Discharge status: Home / Self Care | Payer: Self-pay | Source: Ambulatory Visit | Attending: General Surgery

## 2016-08-14 ENCOUNTER — Ambulatory Visit (HOSPITAL_COMMUNITY): Payer: PPO | Admitting: Vascular Surgery

## 2016-08-14 ENCOUNTER — Ambulatory Visit (HOSPITAL_COMMUNITY): Payer: PPO | Admitting: Certified Registered Nurse Anesthetist

## 2016-08-14 ENCOUNTER — Ambulatory Visit (HOSPITAL_COMMUNITY)
Admission: RE | Admit: 2016-08-14 | Discharge: 2016-08-15 | Disposition: A | Discharge status: Home / Self Care | Payer: PPO | Source: Ambulatory Visit | Attending: General Surgery | Admitting: General Surgery

## 2016-08-14 DIAGNOSIS — C50411 Malignant neoplasm of upper-outer quadrant of right female breast: Secondary | ICD-10-CM | POA: Diagnosis not present

## 2016-08-14 DIAGNOSIS — Z886 Allergy status to analgesic agent status: Secondary | ICD-10-CM | POA: Diagnosis not present

## 2016-08-14 DIAGNOSIS — J439 Emphysema, unspecified: Secondary | ICD-10-CM | POA: Insufficient documentation

## 2016-08-14 DIAGNOSIS — Z79899 Other long term (current) drug therapy: Secondary | ICD-10-CM | POA: Diagnosis not present

## 2016-08-14 DIAGNOSIS — R2231 Localized swelling, mass and lump, right upper limb: Secondary | ICD-10-CM | POA: Insufficient documentation

## 2016-08-14 DIAGNOSIS — E039 Hypothyroidism, unspecified: Secondary | ICD-10-CM | POA: Diagnosis not present

## 2016-08-14 DIAGNOSIS — Z7901 Long term (current) use of anticoagulants: Secondary | ICD-10-CM | POA: Insufficient documentation

## 2016-08-14 DIAGNOSIS — K219 Gastro-esophageal reflux disease without esophagitis: Secondary | ICD-10-CM | POA: Insufficient documentation

## 2016-08-14 DIAGNOSIS — Z170 Estrogen receptor positive status [ER+]: Secondary | ICD-10-CM | POA: Insufficient documentation

## 2016-08-14 DIAGNOSIS — Z7951 Long term (current) use of inhaled steroids: Secondary | ICD-10-CM | POA: Insufficient documentation

## 2016-08-14 DIAGNOSIS — I10 Essential (primary) hypertension: Secondary | ICD-10-CM | POA: Insufficient documentation

## 2016-08-14 DIAGNOSIS — E7800 Pure hypercholesterolemia, unspecified: Secondary | ICD-10-CM | POA: Diagnosis not present

## 2016-08-14 DIAGNOSIS — C50911 Malignant neoplasm of unspecified site of right female breast: Secondary | ICD-10-CM | POA: Diagnosis present

## 2016-08-14 DIAGNOSIS — F17200 Nicotine dependence, unspecified, uncomplicated: Secondary | ICD-10-CM | POA: Diagnosis not present

## 2016-08-14 DIAGNOSIS — Z791 Long term (current) use of non-steroidal anti-inflammatories (NSAID): Secondary | ICD-10-CM | POA: Insufficient documentation

## 2016-08-14 DIAGNOSIS — D1721 Benign lipomatous neoplasm of skin and subcutaneous tissue of right arm: Secondary | ICD-10-CM | POA: Diagnosis not present

## 2016-08-14 DIAGNOSIS — G8918 Other acute postprocedural pain: Secondary | ICD-10-CM | POA: Diagnosis not present

## 2016-08-14 DIAGNOSIS — C773 Secondary and unspecified malignant neoplasm of axilla and upper limb lymph nodes: Secondary | ICD-10-CM | POA: Diagnosis not present

## 2016-08-14 HISTORY — PX: BREAST LUMPECTOMY WITH AXILLARY LYMPH NODE DISSECTION: SHX5756

## 2016-08-14 HISTORY — PX: BREAST LUMPECTOMY: SHX2

## 2016-08-14 HISTORY — PX: MASS EXCISION: SHX2000

## 2016-08-14 LAB — CBC
HEMATOCRIT: 43.1 % (ref 36.0–46.0)
HEMOGLOBIN: 13.9 g/dL (ref 12.0–15.0)
MCH: 29.2 pg (ref 26.0–34.0)
MCHC: 32.3 g/dL (ref 30.0–36.0)
MCV: 90.5 fL (ref 78.0–100.0)
Platelets: 291 10*3/uL (ref 150–400)
RBC: 4.76 MIL/uL (ref 3.87–5.11)
RDW: 13.9 % (ref 11.5–15.5)
WBC: 16.3 10*3/uL — ABNORMAL HIGH (ref 4.0–10.5)

## 2016-08-14 LAB — CREATININE, SERUM: Creatinine, Ser: 0.76 mg/dL (ref 0.44–1.00)

## 2016-08-14 SURGERY — BREAST LUMPECTOMY WITH AXILLARY LYMPH NODE DISSECTION
Anesthesia: General | Site: Breast | Laterality: Right

## 2016-08-14 MED ORDER — LIDOCAINE HCL 1 % IJ SOLN
INTRAMUSCULAR | Status: AC
  Filled 2016-08-14: qty 20

## 2016-08-14 MED ORDER — SUGAMMADEX SODIUM 200 MG/2ML IV SOLN
INTRAVENOUS | Status: AC
  Filled 2016-08-14: qty 2

## 2016-08-14 MED ORDER — CEFAZOLIN SODIUM-DEXTROSE 2-4 GM/100ML-% IV SOLN
INTRAVENOUS | Status: AC
  Filled 2016-08-14: qty 100

## 2016-08-14 MED ORDER — PROMETHAZINE HCL 25 MG/ML IJ SOLN
12.5000 mg | Freq: Four times a day (QID) | INTRAMUSCULAR | Status: DC | PRN
  Administered 2016-08-14: 12.5 mg via INTRAVENOUS
  Filled 2016-08-14: qty 1

## 2016-08-14 MED ORDER — MIDAZOLAM HCL 2 MG/2ML IJ SOLN
2.0000 mg | Freq: Once | INTRAMUSCULAR | Status: AC
  Administered 2016-08-14: 2 mg via INTRAVENOUS

## 2016-08-14 MED ORDER — HYDRALAZINE HCL 20 MG/ML IJ SOLN: 10.0000 mg | INTRAMUSCULAR | Status: DC | PRN

## 2016-08-14 MED ORDER — LIDOCAINE HCL (PF) 1 % IJ SOLN
INTRAMUSCULAR | Status: DC | PRN
  Administered 2016-08-14: 20 mL

## 2016-08-14 MED ORDER — OXYCODONE HCL 5 MG PO TABS
5.0000 mg | ORAL_TABLET | ORAL | Status: DC | PRN
  Administered 2016-08-14: 5 mg via ORAL
  Filled 2016-08-14: qty 2

## 2016-08-14 MED ORDER — LACTATED RINGERS IV SOLN
INTRAVENOUS | Status: DC | PRN
  Administered 2016-08-14 (×2): via INTRAVENOUS

## 2016-08-14 MED ORDER — METHOCARBAMOL 500 MG PO TABS
500.0000 mg | ORAL_TABLET | Freq: Four times a day (QID) | ORAL | Status: DC | PRN
  Administered 2016-08-14 – 2016-08-15 (×2): 500 mg via ORAL
  Filled 2016-08-14 (×2): qty 1

## 2016-08-14 MED ORDER — LIDOCAINE 2% (20 MG/ML) 5 ML SYRINGE
INTRAMUSCULAR | Status: DC | PRN
  Administered 2016-08-14: 60 mg via INTRAVENOUS

## 2016-08-14 MED ORDER — PHENYLEPHRINE HCL 10 MG/ML IJ SOLN
INTRAMUSCULAR | Status: DC | PRN
  Administered 2016-08-14: 25 ug/min via INTRAVENOUS

## 2016-08-14 MED ORDER — MORPHINE SULFATE (PF) 4 MG/ML IV SOLN
1.0000 mg | INTRAVENOUS | Status: DC | PRN
  Administered 2016-08-14 – 2016-08-15 (×2): 2 mg via INTRAVENOUS
  Filled 2016-08-14 (×2): qty 1

## 2016-08-14 MED ORDER — MIDAZOLAM HCL 2 MG/2ML IJ SOLN
INTRAMUSCULAR | Status: AC
  Filled 2016-08-14: qty 2

## 2016-08-14 MED ORDER — SENNA 8.6 MG PO TABS
1.0000 | ORAL_TABLET | Freq: Two times a day (BID) | ORAL | Status: DC
  Filled 2016-08-14 (×2): qty 1

## 2016-08-14 MED ORDER — MOMETASONE FURO-FORMOTEROL FUM 200-5 MCG/ACT IN AERO
2.0000 | INHALATION_SPRAY | Freq: Two times a day (BID) | RESPIRATORY_TRACT | Status: DC
  Administered 2016-08-14 – 2016-08-15 (×2): 2 via RESPIRATORY_TRACT
  Filled 2016-08-14: qty 8.8

## 2016-08-14 MED ORDER — PRAVASTATIN SODIUM 10 MG PO TABS: 20.0000 mg | ORAL_TABLET | Freq: Every day | ORAL | Status: DC

## 2016-08-14 MED ORDER — ONDANSETRON HCL 4 MG/2ML IJ SOLN
4.0000 mg | Freq: Once | INTRAMUSCULAR | Status: AC | PRN
  Administered 2016-08-14: 4 mg via INTRAVENOUS

## 2016-08-14 MED ORDER — DEXAMETHASONE SODIUM PHOSPHATE 10 MG/ML IJ SOLN
INTRAMUSCULAR | Status: AC
  Filled 2016-08-14: qty 1

## 2016-08-14 MED ORDER — ACETAMINOPHEN 650 MG RE SUPP
650.0000 mg | Freq: Four times a day (QID) | RECTAL | Status: DC | PRN
Start: 2016-08-14 — End: 2016-08-15

## 2016-08-14 MED ORDER — AMLODIPINE BESYLATE 5 MG PO TABS
5.0000 mg | ORAL_TABLET | Freq: Every day | ORAL | Status: DC
  Administered 2016-08-15: 5 mg via ORAL
  Filled 2016-08-14: qty 1

## 2016-08-14 MED ORDER — ONDANSETRON HCL 4 MG/2ML IJ SOLN
INTRAMUSCULAR | Status: AC
  Filled 2016-08-14: qty 2

## 2016-08-14 MED ORDER — ONDANSETRON HCL 4 MG/2ML IJ SOLN
INTRAMUSCULAR | Status: DC | PRN
  Administered 2016-08-14: 4 mg via INTRAVENOUS

## 2016-08-14 MED ORDER — ROCURONIUM BROMIDE 10 MG/ML (PF) SYRINGE
PREFILLED_SYRINGE | INTRAVENOUS | Status: AC
  Filled 2016-08-14: qty 5

## 2016-08-14 MED ORDER — CHLORHEXIDINE GLUCONATE CLOTH 2 % EX PADS: 6.0000 | MEDICATED_PAD | Freq: Once | CUTANEOUS | Status: DC

## 2016-08-14 MED ORDER — ROFLUMILAST 500 MCG PO TABS
500.0000 ug | ORAL_TABLET | Freq: Every day | ORAL | Status: DC
  Administered 2016-08-15: 500 ug via ORAL
  Filled 2016-08-14 (×2): qty 1

## 2016-08-14 MED ORDER — FENTANYL CITRATE (PF) 100 MCG/2ML IJ SOLN
INTRAMUSCULAR | Status: AC
  Filled 2016-08-14: qty 2

## 2016-08-14 MED ORDER — BUPIVACAINE HCL (PF) 0.25 % IJ SOLN
INTRAMUSCULAR | Status: DC | PRN
  Administered 2016-08-14: 30 mL

## 2016-08-14 MED ORDER — LEVOTHYROXINE SODIUM 88 MCG PO TABS
88.0000 ug | ORAL_TABLET | Freq: Every day | ORAL | Status: DC
  Administered 2016-08-15: 88 ug via ORAL
  Filled 2016-08-14: qty 1

## 2016-08-14 MED ORDER — FENTANYL CITRATE (PF) 100 MCG/2ML IJ SOLN
INTRAMUSCULAR | Status: DC | PRN
  Administered 2016-08-14 (×6): 50 ug via INTRAVENOUS

## 2016-08-14 MED ORDER — 0.9 % SODIUM CHLORIDE (POUR BTL) OPTIME
TOPICAL | Status: DC | PRN
  Administered 2016-08-14: 1000 mL

## 2016-08-14 MED ORDER — FENTANYL CITRATE (PF) 250 MCG/5ML IJ SOLN
INTRAMUSCULAR | Status: AC
  Filled 2016-08-14: qty 5

## 2016-08-14 MED ORDER — MIDAZOLAM HCL 5 MG/5ML IJ SOLN
INTRAMUSCULAR | Status: DC | PRN
  Administered 2016-08-14: 2 mg via INTRAVENOUS

## 2016-08-14 MED ORDER — TIOTROPIUM BROMIDE MONOHYDRATE 18 MCG IN CAPS
1.0000 | ORAL_CAPSULE | Freq: Every day | RESPIRATORY_TRACT | Status: DC
  Filled 2016-08-14: qty 5

## 2016-08-14 MED ORDER — BUPIVACAINE HCL (PF) 0.25 % IJ SOLN
INTRAMUSCULAR | Status: AC
  Filled 2016-08-14: qty 30

## 2016-08-14 MED ORDER — PANTOPRAZOLE SODIUM 40 MG PO TBEC
40.0000 mg | DELAYED_RELEASE_TABLET | Freq: Every day | ORAL | Status: DC
  Administered 2016-08-15: 40 mg via ORAL
  Filled 2016-08-14: qty 1

## 2016-08-14 MED ORDER — EPHEDRINE SULFATE-NACL 50-0.9 MG/10ML-% IV SOSY
PREFILLED_SYRINGE | INTRAVENOUS | Status: DC | PRN
Start: 2016-08-14 — End: 2016-08-14
  Administered 2016-08-14: 5 mg via INTRAVENOUS
  Administered 2016-08-14: 10 mg via INTRAVENOUS

## 2016-08-14 MED ORDER — DIPHENHYDRAMINE HCL 12.5 MG/5ML PO ELIX: 12.5000 mg | ORAL_SOLUTION | Freq: Four times a day (QID) | ORAL | Status: DC | PRN

## 2016-08-14 MED ORDER — FENTANYL CITRATE (PF) 100 MCG/2ML IJ SOLN
25.0000 ug | INTRAMUSCULAR | Status: DC | PRN
  Administered 2016-08-14 (×4): 25 ug via INTRAVENOUS

## 2016-08-14 MED ORDER — SIMETHICONE 80 MG PO CHEW: 40.0000 mg | CHEWABLE_TABLET | Freq: Four times a day (QID) | ORAL | Status: DC | PRN

## 2016-08-14 MED ORDER — ENOXAPARIN SODIUM 40 MG/0.4ML ~~LOC~~ SOLN: 40.0000 mg | SUBCUTANEOUS | Status: DC

## 2016-08-14 MED ORDER — LOSARTAN POTASSIUM 50 MG PO TABS
100.0000 mg | ORAL_TABLET | Freq: Every day | ORAL | Status: DC
  Administered 2016-08-15: 100 mg via ORAL
  Filled 2016-08-14: qty 2

## 2016-08-14 MED ORDER — PROPOFOL 10 MG/ML IV BOLUS
INTRAVENOUS | Status: DC | PRN
  Administered 2016-08-14: 150 mg via INTRAVENOUS

## 2016-08-14 MED ORDER — PROMETHAZINE HCL 25 MG/ML IJ SOLN: 6.2500 mg | Freq: Four times a day (QID) | INTRAMUSCULAR | Status: DC | PRN

## 2016-08-14 MED ORDER — ONDANSETRON 4 MG PO TBDP: 4.0000 mg | ORAL_TABLET | Freq: Four times a day (QID) | ORAL | Status: DC | PRN

## 2016-08-14 MED ORDER — ACETAMINOPHEN 325 MG PO TABS
650.0000 mg | ORAL_TABLET | Freq: Four times a day (QID) | ORAL | Status: DC | PRN
Start: 2016-08-14 — End: 2016-08-15

## 2016-08-14 MED ORDER — KCL IN DEXTROSE-NACL 20-5-0.45 MEQ/L-%-% IV SOLN
INTRAVENOUS | Status: DC
  Administered 2016-08-14: 13:00:00 via INTRAVENOUS
  Filled 2016-08-14: qty 1000

## 2016-08-14 MED ORDER — ALBUTEROL SULFATE (2.5 MG/3ML) 0.083% IN NEBU: 2.5000 mg | INHALATION_SOLUTION | Freq: Four times a day (QID) | RESPIRATORY_TRACT | Status: DC | PRN

## 2016-08-14 MED ORDER — PROPOFOL 10 MG/ML IV BOLUS
INTRAVENOUS | Status: AC
  Filled 2016-08-14: qty 40

## 2016-08-14 MED ORDER — EPHEDRINE 5 MG/ML INJ
INTRAVENOUS | Status: AC
  Filled 2016-08-14: qty 10

## 2016-08-14 MED ORDER — POLYETHYLENE GLYCOL 3350 17 G PO PACK: 17.0000 g | PACK | Freq: Two times a day (BID) | ORAL | Status: DC

## 2016-08-14 MED ORDER — CEFAZOLIN SODIUM-DEXTROSE 2-4 GM/100ML-% IV SOLN
2.0000 g | INTRAVENOUS | Status: AC
  Administered 2016-08-14: 2 g via INTRAVENOUS

## 2016-08-14 MED ORDER — DIPHENHYDRAMINE HCL 50 MG/ML IJ SOLN: 12.5000 mg | Freq: Four times a day (QID) | INTRAMUSCULAR | Status: DC | PRN

## 2016-08-14 MED ORDER — CEFAZOLIN SODIUM-DEXTROSE 2-4 GM/100ML-% IV SOLN
2.0000 g | Freq: Three times a day (TID) | INTRAVENOUS | Status: AC
  Administered 2016-08-14: 2 g via INTRAVENOUS
  Filled 2016-08-14: qty 100

## 2016-08-14 MED ORDER — ONDANSETRON HCL 4 MG/2ML IJ SOLN
4.0000 mg | Freq: Four times a day (QID) | INTRAMUSCULAR | Status: DC | PRN
  Administered 2016-08-14: 4 mg via INTRAVENOUS

## 2016-08-14 MED ORDER — MELOXICAM 7.5 MG PO TABS
15.0000 mg | ORAL_TABLET | Freq: Every day | ORAL | Status: DC
  Administered 2016-08-15: 15 mg via ORAL
  Filled 2016-08-14: qty 2

## 2016-08-14 SURGICAL SUPPLY — 78 items
ADH SKN CLS APL DERMABOND .7 (GAUZE/BANDAGES/DRESSINGS)
APL SKNCLS STERI-STRIP NONHPOA (GAUZE/BANDAGES/DRESSINGS)
BENZOIN TINCTURE PRP APPL 2/3 (GAUZE/BANDAGES/DRESSINGS) ×2 IMPLANT
BINDER BREAST MEDIUM (GAUZE/BANDAGES/DRESSINGS) ×2 IMPLANT
BIOPATCH RED 1 DISK 7.0 (GAUZE/BANDAGES/DRESSINGS) ×1 IMPLANT
BIOPATCH RED 1IN DISK 7.0MM (GAUZE/BANDAGES/DRESSINGS) ×1
BLADE SURG 10 STRL SS (BLADE) ×2 IMPLANT
BLADE SURG 15 STRL LF DISP TIS (BLADE) IMPLANT
BLADE SURG 15 STRL SS (BLADE) ×4
BNDG COHESIVE 4X5 TAN STRL (GAUZE/BANDAGES/DRESSINGS) ×2 IMPLANT
CANISTER SUCT 3000ML PPV (MISCELLANEOUS) ×4 IMPLANT
CHLORAPREP W/TINT 26ML (MISCELLANEOUS) ×4 IMPLANT
CLIP TI MEDIUM 24 (CLIP) ×2 IMPLANT
CLIP TI MEDIUM 6 (CLIP) ×2 IMPLANT
CLIP TI WIDE RED SMALL 6 (CLIP) ×2 IMPLANT
CLOSURE WOUND 1/2 X4 (GAUZE/BANDAGES/DRESSINGS) ×1
CONT SPEC 4OZ CLIKSEAL STRL BL (MISCELLANEOUS) ×4 IMPLANT
CONT SPEC STER OR (MISCELLANEOUS) ×2 IMPLANT
COVER MAYO STAND STRL (DRAPES) ×2 IMPLANT
COVER SURGICAL LIGHT HANDLE (MISCELLANEOUS) ×4 IMPLANT
DERMABOND ADVANCED (GAUZE/BANDAGES/DRESSINGS)
DERMABOND ADVANCED .7 DNX12 (GAUZE/BANDAGES/DRESSINGS) IMPLANT
DRAIN CHANNEL 19F RND (DRAIN) ×2 IMPLANT
DRAPE LAPAROSCOPIC ABDOMINAL (DRAPES) ×2 IMPLANT
DRAPE LAPAROTOMY 100X72 PEDS (DRAPES) IMPLANT
DRAPE UTILITY XL STRL (DRAPES) ×6 IMPLANT
DRSG TEGADERM 4X4.75 (GAUZE/BANDAGES/DRESSINGS) ×2 IMPLANT
ELECT CAUTERY BLADE 6.4 (BLADE) ×4 IMPLANT
ELECT REM PT RETURN 9FT ADLT (ELECTROSURGICAL) ×4
ELECTRODE REM PT RTRN 9FT ADLT (ELECTROSURGICAL) ×2 IMPLANT
EVACUATOR SILICONE 100CC (DRAIN) ×2 IMPLANT
GAUZE SPONGE 4X4 12PLY STRL (GAUZE/BANDAGES/DRESSINGS) ×2 IMPLANT
GAUZE SPONGE 4X4 16PLY XRAY LF (GAUZE/BANDAGES/DRESSINGS) ×4 IMPLANT
GLOVE BIO SURGEON STRL SZ 6 (GLOVE) ×4 IMPLANT
GLOVE BIOGEL PI IND STRL 6.5 (GLOVE) ×2 IMPLANT
GLOVE BIOGEL PI IND STRL 7.0 (GLOVE) IMPLANT
GLOVE BIOGEL PI IND STRL 7.5 (GLOVE) IMPLANT
GLOVE BIOGEL PI IND STRL 8 (GLOVE) IMPLANT
GLOVE BIOGEL PI INDICATOR 6.5 (GLOVE) ×2
GLOVE BIOGEL PI INDICATOR 7.0 (GLOVE) ×2
GLOVE BIOGEL PI INDICATOR 7.5 (GLOVE) ×4
GLOVE BIOGEL PI INDICATOR 8 (GLOVE) ×2
GLOVE ECLIPSE 7.5 STRL STRAW (GLOVE) ×2 IMPLANT
GLOVE SURG SS PI 6.5 STRL IVOR (GLOVE) ×2 IMPLANT
GLOVE SURG SS PI 7.5 STRL IVOR (GLOVE) ×2 IMPLANT
GOWN STRL REUS W/ TWL LRG LVL3 (GOWN DISPOSABLE) ×2 IMPLANT
GOWN STRL REUS W/TWL 2XL LVL3 (GOWN DISPOSABLE) ×4 IMPLANT
GOWN STRL REUS W/TWL LRG LVL3 (GOWN DISPOSABLE) ×8
KIT BASIN OR (CUSTOM PROCEDURE TRAY) ×4 IMPLANT
KIT MARKER MARGIN INK (KITS) ×2 IMPLANT
KIT ROOM TURNOVER OR (KITS) ×4 IMPLANT
LIGHT WAVEGUIDE WIDE FLAT (MISCELLANEOUS) ×2 IMPLANT
NDL HYPO 25GX1X1/2 BEV (NEEDLE) ×2 IMPLANT
NEEDLE HYPO 25GX1X1/2 BEV (NEEDLE) ×4 IMPLANT
NS IRRIG 1000ML POUR BTL (IV SOLUTION) ×4 IMPLANT
PACK SURGICAL SETUP 50X90 (CUSTOM PROCEDURE TRAY) ×4 IMPLANT
PAD ARMBOARD 7.5X6 YLW CONV (MISCELLANEOUS) ×6 IMPLANT
PENCIL BUTTON HOLSTER BLD 10FT (ELECTRODE) ×4 IMPLANT
SPECIMEN JAR MEDIUM (MISCELLANEOUS) ×2 IMPLANT
SPECIMEN JAR SMALL (MISCELLANEOUS) ×4 IMPLANT
SPONGE LAP 18X18 X RAY DECT (DISPOSABLE) ×4 IMPLANT
SPONGE LAP 4X18 X RAY DECT (DISPOSABLE) IMPLANT
STOCKINETTE IMPERVIOUS 9X36 MD (GAUZE/BANDAGES/DRESSINGS) ×2 IMPLANT
STRIP CLOSURE SKIN 1/2X4 (GAUZE/BANDAGES/DRESSINGS) ×1 IMPLANT
SUT ETHILON 2 0 FS 18 (SUTURE) ×2 IMPLANT
SUT MNCRL AB 4-0 PS2 18 (SUTURE) ×2 IMPLANT
SUT MON AB 4-0 PC3 18 (SUTURE) ×4 IMPLANT
SUT SILK 2 0 FS (SUTURE) ×2 IMPLANT
SUT VIC AB 3-0 SH 27 (SUTURE) ×8
SUT VIC AB 3-0 SH 27X BRD (SUTURE) ×2 IMPLANT
SYR BULB 3OZ (MISCELLANEOUS) ×4 IMPLANT
SYR CONTROL 10ML LL (SYRINGE) ×4 IMPLANT
TOWEL OR 17X24 6PK STRL BLUE (TOWEL DISPOSABLE) ×4 IMPLANT
TOWEL OR 17X26 10 PK STRL BLUE (TOWEL DISPOSABLE) ×2 IMPLANT
TUBE CONNECTING 12'X1/4 (SUCTIONS) ×1
TUBE CONNECTING 12X1/4 (SUCTIONS) ×1 IMPLANT
WATER STERILE IRR 1000ML POUR (IV SOLUTION) IMPLANT
YANKAUER SUCT BULB TIP NO VENT (SUCTIONS) ×2 IMPLANT

## 2016-08-14 NOTE — Op Note (Signed)
Right Breast Lumpectomy with Axillary lymph node dissection, excision of right arm mass 2.5 cm, subcutaneous, excision of right posterior axillary mass 2 cm  Indications: This patient presents with history of right breast cancer with clinically positive axillary lymph node exam.  She also has a fatty uncomfortable right forearm mass and a posterior axillary mass  Pre-operative Diagnosis: right breast cancer  Post-operative Diagnosis: right breast cancer  Surgeon: Stark Klein   Assistants: Judyann Munson, RNFA  Anesthesia: General LMA anesthesia and Local anesthesia pectoral block  ASA Class: 3  Procedure Details  The patient was seen in the Holding Room. The risks, benefits, complications, treatment options, and expected outcomes were discussed with the patient. The possibilities of reaction to medication, pulmonary aspiration, bleeding, infection, the need for additional procedures, failure to diagnose a condition, and creating a complication requiring transfusion or operation were discussed with the patient. The patient concurred with the proposed plan, giving informed consent.  The site of surgery properly noted/marked. The patient was taken to Operating Room # 1, identified as Linda [name] and the procedure verified as excision of right arm mass, right breast lumpectomy and right axillary lymph node dissection. A Time Out was held and the above information confirmed.  After induction of anesthesia, the right arm, breast, and chest were prepped and draped in standard fashion.  The right arm mass was anesthetized and a longitudinal incision was made with a #15 blade.  The mass was dissected free with a hemostat.  The deep vascular pedicle was divided with the cautery.  The skin was reapproximated with 1 3-0 vicryl deep dermal suture and a running 4-0 monocryl subcuticular suture.      The lumpectomy was performed by creating an axillary incision.  The palpable mass was in the axillary  tail.  This was grasped and dissected away from the surrounding tissue.   Dissection was carried down to the pectoral fascia.  The specimen was marked with the margin marker paint kit. Hemostasis was achieved with cautery.  Large metal clips were placed at the margins of the lumpectomy.  An axillary dissection was performed with removal of the associated lymph nodes and surrounding adipose tissue. This included levels I and II. This was accomplished by exposing the axillary vein anteriorly and inferiorly to the level of the pectoralis minor and laterally over the latissimus dorsi muscle. Posteriorly, the dissection continued to the subscapularis.  Small venous tributaries, lymphatics, and vessels were clipped and ligated or cauterized and divided. The subscapularis muscle was skeletonized. The long thoracic and thoracodorsal neurovascular bundles were identified and preserved.  Care was taken to get the subpectoral (Rotter's nodes).    The posterior axillary palpable mass was lateral/posterior to the latissimus.  This was dissected free with the cautery and the tonsil.    A 19 Fr Blake drain was placed and secured with a 2-0 nylon.  The wound was irrigated and closed with a 3-0 Vicryl deep dermal interrupted and a 4-0 vicryl subcuticular closure in layers.    Sterile dressings were applied. At the end of the operation, all sponge, instrument, and needle counts were correct.  Findings: arm mass and posterior axillary mass appeared to be lipomas.  The lumpectomy margins are:  Superior - axillary contents (removed), anterior - skin, posterior - pectoralis, Lateral- skin.  Several palpable axillary lymph nodes  Estimated Blood Loss:  Minimal         Drains: 19 Fr blake         Specimens:  right arm mass, right breast lumpectomy,  right axillary contents, rotter's nodes, right posterior axillary mass                Complications:  None; patient tolerated the procedure well.         Disposition: PACU -  hemodynamically stable.         Condition: stable

## 2016-08-14 NOTE — Anesthesia Procedure Notes (Signed)
Procedure Name: LMA Insertion Date/Time: 08/14/2016 8:03 AM Performed by: Everlean Cherry A Pre-anesthesia Checklist: Patient identified, Emergency Drugs available, Suction available and Patient being monitored Patient Re-evaluated:Patient Re-evaluated prior to inductionOxygen Delivery Method: Circle system utilized Preoxygenation: Pre-oxygenation with 100% oxygen Intubation Type: IV induction Ventilation: Mask ventilation without difficulty and Oral airway inserted - appropriate to patient size LMA: LMA inserted LMA Size: 3.0 Number of attempts: 1 Placement Confirmation: positive ETCO2 and breath sounds checked- equal and bilateral Tube secured with: Tape Dental Injury: Teeth and Oropharynx as per pre-operative assessment

## 2016-08-14 NOTE — Anesthesia Postprocedure Evaluation (Signed)
Anesthesia Post Note  Patient: Linda [name]  Procedure(s) Performed: Procedure(s) (LRB): RIGHT BREAST LUMPECTOMY WITH AXILLARY LYMPH NODE DISSECTION (Right) EXCISION RIGHT ARM MASS 2X3CM (Right)  Patient location during evaluation: PACU Anesthesia Type: General Level of consciousness: awake, awake and alert and oriented Pain management: pain level controlled Vital Signs Assessment: post-procedure vital signs reviewed and stable Respiratory status: spontaneous breathing, nonlabored ventilation and respiratory function stable Cardiovascular status: blood pressure returned to baseline Anesthetic complications: no       Last Vitals:  Vitals:   08/14/16 1130 08/14/16 1157  BP:  133/60  Pulse: 65 63  Resp: 16 18  Temp:  36.4 C    Last Pain:  Vitals:   08/14/16 1240  TempSrc:   PainSc: 6                  Alexander Aument COKER

## 2016-08-14 NOTE — Transfer of Care (Signed)
Immediate Anesthesia Transfer of Care Note  Patient: Linda [name]  Procedure(s) Performed: Procedure(s): RIGHT BREAST LUMPECTOMY WITH AXILLARY LYMPH NODE DISSECTION (Right) EXCISION RIGHT ARM MASS 2X3CM (Right)  Patient Location: PACU  Anesthesia Type:GA combined with regional for post-op pain  Level of Consciousness: alert , oriented, drowsy and patient cooperative  Airway & Oxygen Therapy: Patient Spontanous Breathing and Patient connected to nasal cannula oxygen  Post-op Assessment: Report given to RN, Post -op Vital signs reviewed and stable and Patient moving all extremities X 4  Post vital signs: Reviewed and stable  Last Vitals:  Vitals:   08/14/16 0625 08/14/16 0944  BP: (!) 167/82   Pulse: 82   Resp: 18   Temp: 36.8 C (P) 36.2 C    Last Pain:  Vitals:   08/14/16 0625  TempSrc: Oral  PainSc:       Patients Stated Pain Goal: 4 (06/18/20 4825)  Complications: No apparent anesthesia complications

## 2016-08-14 NOTE — Interval H&P Note (Signed)
History and Physical Interval Note:  08/14/2016 7:29 AM   Cellar  has presented today for surgery, with the diagnosis of RIGHT BREAST CANCER and right arm mass.  The various methods of treatment have been discussed with the patient and family. After consideration of risks, benefits and other options for treatment, the patient has consented to  Procedure(s): RIGHT BREAST LUMPECTOMY WITH AXILLARY LYMPH NODE DISSECTION (Right) EXCISION RIGHT ARM MASS 2X3CM (Right) as a surgical intervention .  The patient's history has been reviewed, patient examined, no change in status, stable for surgery.  I have reviewed the patient's chart and labs.  Questions were answered to the patient's satisfaction.     Linda [name]

## 2016-08-14 NOTE — Discharge Instructions (Addendum)
[place] Office Phone Number [phone]  BREAST BIOPSY/ PARTIAL MASTECTOMY: POST OP INSTRUCTIONS  Always review your discharge instruction sheet given to you by the facility where your surgery was performed.  IF YOU HAVE DISABILITY OR FAMILY LEAVE FORMS, YOU MUST BRING THEM TO THE OFFICE FOR PROCESSING.  DO NOT GIVE THEM TO YOUR DOCTOR.  1. A prescription for pain medication may be given to you upon discharge.  Take your pain medication as prescribed, if needed.  If narcotic pain medicine is not needed, then you may take acetaminophen (Tylenol) or ibuprofen (Advil) as needed. 2. Take your usually prescribed medications unless otherwise directed 3. If you need a refill on your pain medication, please contact your pharmacy.  They will contact our office to request authorization.  Prescriptions will not be filled after 5pm or on week-ends. 4. You should eat very light the first 24 hours after surgery, such as soup, crackers, pudding, etc.  Resume your normal diet the day after surgery. 5. Most patients will experience some swelling and bruising in the breast.  Ice packs and a good support bra will help.  Swelling and bruising can take several days to resolve.  6. It is common to experience some constipation if taking pain medication after surgery.  Increasing fluid intake and taking a stool softener will usually help or prevent this problem from occurring.  A mild laxative (Milk of Magnesia or Miralax) should be taken according to package directions if there are no bowel movements after 48 hours. 7. Unless discharge instructions indicate otherwise, you may remove your bandages 48 hours after surgery, and you may shower at that time.  You may have steri-strips (small skin tapes) in place directly over the incision.  These strips should be left on the skin for 7-10 days.   Any sutures or staples will be removed at the office during your follow-up visit. 8. ACTIVITIES:  You may resume  regular daily activities (gradually increasing) beginning the next day.  Wearing a good support bra or sports bra (or the breast binder) minimizes pain and swelling.  You may have sexual intercourse when it is comfortable. a. You may drive when you no longer are taking prescription pain medication, you can comfortably wear a seatbelt, and you can safely maneuver your car and apply brakes. b. RETURN TO WORK:  __________1 week_______________ 9. You should see your doctor in the office for a follow-up appointment approximately two weeks after your surgery.  Your doctors nurse will typically make your follow-up appointment when she calls you with your pathology report.  Expect your pathology report 2-3 business days after your surgery.  You may call to check if you do not hear from [place] after three days.   WHEN TO CALL YOUR DOCTOR: 1. Fever over 101.0 2. Nausea and/or vomiting. 3. Extreme swelling or bruising. 4. Continued bleeding from incision. 5. Increased pain, redness, or drainage from the incision.  The clinic staff is available to answer your questions during regular business hours.  Please dont hesitate to call and ask to speak to one of the nurses for clinical concerns.  If you have a medical emergency, go to the nearest emergency room or call 911.  A surgeon from [facility] is always on call at the hospital.  For further questions, please visit [email or web address]   S[facility] Surgical drains are used to remove extra fluid that normally builds up in a surgical wound after surgery. A surgical drain helps to  heal a surgical wound. Different kinds of surgical drains include:  Active drains. These drains use suction to pull drainage away from the surgical wound. Drainage flows through a tube to a container outside of the body. It is important to keep the bulb or the drainage container flat (compressed) at all times, except while you empty it. Flattening the  bulb or container creates suction. The two most common types of active drains are bulb drains and Hemovac drains.  Passive drains. These drains allow fluid to drain naturally, by gravity. Drainage flows through a tube to a bandage (dressing) or a container outside of the body. Passive drains do not need to be emptied. The most common type of passive drain is the Penrose drain. A drain is placed during surgery. Immediately after surgery, drainage is usually bright red and a little thicker than water. The drainage may gradually turn yellow or pink and become thinner. It is likely that your health care provider will remove the drain when the drainage stops or when the amount decreases to 20 mL per day for 3 days in a row.   How to care for your surgical drain  Keep the skin around the drain dry and covered with a dressing at all times.  Check your drain area every day for signs of infection. Check for:  More redness, swelling, or pain.  Pus or a bad smell.  Cloudy drainage. Follow instructions from your health care provider about how to take care of your drain and how to change your dressing. Change your dressing at least one time every day. Change it more often if needed to keep the dressing dry. Make sure you: 1. Gather your supplies, including:  Split gauze drain sponge: 4 x 4 inches (10 x 10 cm).  Gauze square: 4 x 4 inches (10 x 10 cm). 2. Wash your hands with soap and water before you change your dressing. If soap and water are not available, use hand sanitizer. 3. Remove the old dressing. Avoid using scissors to do that. 4. Use sterile saline to clean your skin around the drain. 5. Place the tube through the slit in a drain sponge. Place the drain sponge so that it covers your wound. 6. Place the gauze square or another drain sponge on top of the drain sponge that is on the wound. Make sure the tube is between those layers. 7. Place the breast binder or bra back on.  You can tape it if  you want to, but usually the binder will hold the dressing in place.   8. If you have an active bulb or Hemovac drain, tape the drainage tube to your skin 1-2 inches (2.5-5 cm) below the place where the tube enters your body. Taping keeps the tube from pulling on any stitches (sutures) that you have. 9. Wash your hands with soap and water. 10. Write down the color of your drainage and how often you change your dressing. How to empty your active bulb or Hemovac drain 1. Make sure that you have a measuring cup that you can empty your drainage into. 2. Wash your hands with soap and water. If soap and water are not available, use hand sanitizer. 3. Gently move your fingers down the tube while squeezing very lightly. This is called stripping the tube. This clears any drainage, clots, or tissue from the tube.  Do not pull on the tube.  You may need to strip the tube several times every day to keep the  tube clear. 4. Open the bulb cap or the drain plug. Do not touch the inside of the cap or the bottom of the plug. 5. Empty all of the drainage into the measuring cup. 6. Compress the bulb or the container and replace the cap or the plug. To compress the bulb or the container, squeeze it firmly in the middle while you close the cap or plug the container. 7. Write down the amount of drainage that you have in each 24-hour period. If you have less than 20 mL) of drainage during 24 hours three days in a row, contact your health care provider. 8. Flush the drainage down the toilet. 9. Wash your hands with soap and water. Contact a health care provider if:  You have more redness, swelling, or pain around your drain area.  The amount of drainage that you have is increasing instead of decreasing.  You have pus or a bad smell coming from your drain area.  You have a fever.  You have drainage that is cloudy.  There is a sudden stop or a sudden decrease in the amount of drainage that you have.  Your tube  falls out.  Your active draindoes not stay compressedafter you empty it. This information is not intended to replace advice given to you by your health care provider. Make sure you discuss any questions you have with your health care provider. Document Released: 03/14/2000 Document Revised: 08/23/2015 Document Reviewed: 10/04/2014 Elsevier Interactive Patient Education  2017 [employer].

## 2016-08-14 NOTE — H&P (Signed)
Linda [name] 07/21/2016 9:37 AM Location: [facility] Patient #: [id number] DOB: 1945-09-07 Divorced / Language: Cleophus Molt / Race: White Female   History of Present Illness Stark Klein MD; 07/21/2016 10:54 AM) The patient is a 71 year old female who presents with breast cancer. Patient is an 71 year old female who presents with a new diagnosis of breast cancer. The patient had some calcifications that she was called back for an 2016, but she did not get her follow-up films. She presented with a palpable breast mass that has been present for around a year. Diagnostic imaging demonstrated a palpable mass at 10:30 far from the nipple. There were also multiple lymph nodes were positive. Ultrasound correlated with this was 1.8-1.9 cm mass in the breast and 2 enlarged lymph nodes in the axilla. The patient broke her hip in February and had surgery. She is still recovering from that. She is about ready to go back to the gym. She has COPD and feels that she does not want any chemotherapy because of this. She is also concerned about being put to sleep because of her CoPD. The patient is on numerous inhalers for this. She states that she was told that she needs a pediatric endotracheal tube if she has to get intubated.  DX mammogram 07/03/2016 FINDINGS: In the upper-outer quadrant of the right breast at the more inferior palpable site, there is an irregular mass measuring approximately 1.8 cm. There are associated pleomorphic calcifications in a linear configuration. This is the same area which was recall due in 2016, however it does not appear that the patient returned for the diagnostic workup. The second palpable site in the right axilla demonstrates a large abnormal appearing rounded lymph node.  Mammographic images were processed with CAD.  Physical exam of the palpable site in the upper-outer quadrant of the right breast demonstrates a hard superficial palpable mass. There is  a possible subtle deep mass at the palpable site in the right axilla.  Ultrasound targeted to the right breast at 10:30, 9 cm from the nipple demonstrates an irregular hypoechoic mass measuring 1.7 x 1.0 x 1.6 cm. Blood flow was identified within the mass on color Doppler imaging. There are 2 abnormal appearing lymph nodes in the right axilla, the largest of which measures 1.9 cm. A smaller 1.2 cm lymph node with an effaced hilum is seen adjacent to the larger lymph node.  IMPRESSION: 1. There is a mass in the right breast at 10:30 associated with linear pleomorphic calcifications. The appearance is highly concerning for DCIS with an invasive component.  2. There are 2 abnormal right axillary lymph nodes.  Pathology Diagnosis 1. Breast, right, needle core biopsy, 10:30 o'clock - INVASIVE DUCTAL CARCINOMA. - LYMPHOVASCULAR INVASION IS IDENTIFIED. - SEE COMMENT. 2. Lymph node, needle/core biopsy, right axilla - METASTATIC CARCINOMA IN 1 OF 1 LYMPH NODE (1/1). Microscopic Comment 1. The carcinoma appears grade 3 and has micropapillary features.   Past Surgical History (Janette Ranson, CMA; 07/21/2016 9:38 AM) Appendectomy  Breast Biopsy  Right. Foot Surgery  Bilateral. Hip Surgery  Left. Hysterectomy (not due to cancer) - Partial  Laparoscopic Inguinal Hernia Surgery  Bilateral. Tonsillectomy   Diagnostic Studies History (Janette Ranson, CMA; 07/21/2016 9:38 AM) Colonoscopy  >10 years ago Mammogram  within last year Pap Smear  >5 years ago  Allergies (Janette Ranson, CMA; 07/21/2016 9:44 AM) Aspirin 81 *ANALGESICS - NonNarcotic*  Allergies Reconciled   Medication History (Janette Ranson, CMA; 07/21/2016 9:44 AM) Spiriva Respimat (2.5MCG/ACT Aerosol  Soln, Inhalation) Active. ProAir HFA (108 (90 Base)MCG/ACT Aerosol Soln, Inhalation) Active. AmLODIPine Besylate (5MG  Tablet, Oral) Active. Symbicort (160-4.5MCG/ACT Aerosol, Inhalation)  Active. Cholecalciferol (1000UNIT Capsule, Oral) Active. Daliresp (500MCG Tablet, Oral) Active. Levothroid (88MCG Tablet, Oral) Active. Losartan Potassium (100MG  Tablet, Oral) Active. Meloxicam (15MG  Tablet, Oral) Active. Robaxin (500MG  Tablet, Oral) Active. Omeprazole (20MG  Capsule DR, Oral) Active. Pravastatin Sodium (20MG  Tablet, Oral) Active. Xarelto (10MG  Tablet, Oral) Active. Medications Reconciled  Social History (Janette Ranson, CMA; 07/21/2016 9:38 AM) Alcohol use  Occasional alcohol use. Caffeine use  Carbonated beverages, Coffee, Tea. No drug use  Tobacco use  Current some day smoker.  Family History (Janette Ranson, CMA; 07/21/2016 9:38 AM) Alcohol Abuse  Father. Cancer  Father. Diabetes Mellitus  Sister. Heart Disease  Brother, Mother, Sister. Heart disease in female family member before age 75  Hypertension  Mother, Sister. Respiratory Condition  Father. Thyroid problems  Sister.  Pregnancy / Birth History (Janette Ranson, CMA; 07/21/2016 9:38 AM) Age at menarche  8 years. Age of menopause  46-50 Gravida  3 Maternal age  76-20 Para  3  Other Problems (Janette Ranson, CMA; 07/21/2016 9:38 AM) Arthritis  Asthma  Chronic Obstructive Lung Disease  Emphysema Of Lung  Gastroesophageal Reflux Disease  High blood pressure  Hypercholesterolemia  Lump In Breast  Thyroid Disease     Review of Systems (Janette Ranson CMA; 07/21/2016 9:38 AM) General Not Present- Appetite Loss, Chills, Fatigue, Fever, Night Sweats, Weight Gain and Weight Loss. Skin Not Present- Change in Wart/Mole, Dryness, Hives, Jaundice, New Lesions, Non-Healing Wounds, Rash and Ulcer. HEENT Present- Hearing Loss, Seasonal Allergies and Sinus Pain. Not Present- Earache, Hoarseness, Nose Bleed, Oral Ulcers, Ringing in the Ears, Sore Throat, Visual Disturbances, Wears glasses/contact lenses and Yellow Eyes. Respiratory Present- Difficulty Breathing. Not Present-  Bloody sputum, Chronic Cough, Snoring and Wheezing. Cardiovascular Present- Difficulty Breathing Lying Down, Leg Cramps and Shortness of Breath. Not Present- Chest Pain, Palpitations, Rapid Heart Rate and Swelling of Extremities. Gastrointestinal Present- Constipation. Not Present- Abdominal Pain, Bloating, Bloody Stool, Change in Bowel Habits, Chronic diarrhea, Difficulty Swallowing, Excessive gas, Gets full quickly at meals, Hemorrhoids, Indigestion, Nausea, Rectal Pain and Vomiting. Female Genitourinary Not Present- Frequency, Nocturia, Painful Urination, Pelvic Pain and Urgency. Musculoskeletal Present- Joint Pain and Muscle Pain. Not Present- Back Pain, Joint Stiffness, Muscle Weakness and Swelling of Extremities. Neurological Not Present- Decreased Memory, Fainting, Headaches, Numbness, Seizures, Tingling, Tremor, Trouble walking and Weakness. Psychiatric Not Present- Anxiety, Bipolar, Change in Sleep Pattern, Depression, Fearful and Frequent crying. Endocrine Not Present- Cold Intolerance, Excessive Hunger, Hair Changes, Heat Intolerance, Hot flashes and New Diabetes. Hematology Present- Easy Bruising. Not Present- Blood Thinners, Excessive bleeding, Gland problems, HIV and Persistent Infections.   Physical Exam Stark Klein MD; 07/21/2016 10:55 AM) General Mental Status-Alert. General Appearance-Consistent with stated age. Hydration-Well hydrated. Voice-Normal. Note: some lip pursing with conversation.   Head and Neck Head-normocephalic, atraumatic with no lesions or palpable masses. Trachea-midline. Thyroid Gland Characteristics - normal size and consistency.  Eye Eyeball - Bilateral-Extraocular movements intact. Sclera/Conjunctiva - Bilateral-No scleral icterus.  Chest and Lung Exam Chest and lung exam reveals -quiet, even and easy respiratory effort with no use of accessory muscles and on auscultation, normal breath sounds, no adventitious sounds and  normal vocal resonance. Inspection Chest Wall - Normal. Back - normal.  Breast Note: palpable 2 cm mass in axillary tail of right breast. + palpable LN as well in that breast. Breasts are A-B size. minimal ptosis. heterogeneously dense. no abnormalities on left.  no nipple retraction or discharge either breast.   Cardiovascular Cardiovascular examination reveals -normal heart sounds, regular rate and rhythm with no murmurs and normal pedal pulses bilaterally.  Abdomen Inspection Inspection of the abdomen reveals - No Hernias. Palpation/Percussion Palpation and Percussion of the abdomen reveal - Soft, Non Tender, No Rebound tenderness, No Rigidity (guarding) and No hepatosplenomegaly. Auscultation Auscultation of the abdomen reveals - Bowel sounds normal.  Neurologic Neurologic evaluation reveals -alert and oriented x 3 with no impairment of recent or remote memory. Mental Status-Normal.  Musculoskeletal Global Assessment -Note: no gross deformities.  Normal Exam - Left-Upper Extremity Strength Normal and Lower Extremity Strength Normal. Normal Exam - Right-Upper Extremity Strength Normal and Lower Extremity Strength Normal.  Lymphatic Head & Neck  General Head & Neck Lymphatics: Bilateral - Description - Normal. Axillary  General Axillary Region: Bilateral - Description - Normal. Tenderness - Non Tender. Femoral & Inguinal  Generalized Femoral & Inguinal Lymphatics: Bilateral - Description - No Generalized lymphadenopathy.    Assessment & Plan Stark Klein MD; 07/21/2016 10:57 AM) MALIGNANT NEOPLASM OF UPPER-OUTER QUADRANT OF RIGHT BREAST IN FEMALE, ESTROGEN RECEPTOR POSITIVE (C50.411) Impression: Patient has a new diagnosis of locally advanced right breast cancer. The patient has an appointment with Dr. Lindi Adie on Friday. She will get staging studies and discuss adjuvant treatment. I encouraged the patient to listen to Dr. Payton Mccallum he recommends  chemotherapy.  I will plan to do a right lumpectomy and axillary lymph node dissection. I think this can all be done through an axillary incision. I will not plan on doing a seed localization since the mass is easily palpable. I will x-ray of the lesion intraoperatively. I discussed the risks of surgery including bleeding, infection, seroma, damage to adjacent structures, lymphedema, possible need for additional procedures or surgeries, possible need for additional treatment, heart or lung complications, and others. I advised the patient that she would be recommended to get radiation due to the positive lymph node. I discussed that mastectomy does not essentially affect her rate of recurrence that much. She would not be eligible to get immediate reconstruction because of her positive lymph node.  Surgery will be scheduled at the first mutually available date. 45 min spent in evaluation, examination, counseling, and coordination of care. >50% spent in counseling. Current Plans Pt Education - flb breast cancer surgery: discussed with patient and provided information. Referred to Radiation Oncology, for evaluation and follow up (Radiation Oncology). Routine. You are being scheduled for surgery- Our schedulers will call you.  You should hear from our office's scheduling department within 5 working days about the location, date, and time of surgery. We try to make accommodations for patient's preferences in scheduling surgery, but sometimes the OR schedule or the surgeon's schedule prevents [place] from making those accommodations.  If you have not heard from our office [phone]) in 5 working days, call the office and ask for your surgeon's nurse.  If you have other questions about your diagnosis, plan, or surgery, call the office and ask for your surgeon's nurse.    Signed by Stark Klein, MD (07/21/2016 10:58 AM)

## 2016-08-15 ENCOUNTER — Encounter (HOSPITAL_COMMUNITY): Payer: Self-pay | Admitting: General Surgery

## 2016-08-15 DIAGNOSIS — C50411 Malignant neoplasm of upper-outer quadrant of right female breast: Secondary | ICD-10-CM | POA: Diagnosis not present

## 2016-08-15 LAB — BASIC METABOLIC PANEL
ANION GAP: 6 (ref 5–15)
BUN: 15 mg/dL (ref 6–20)
CHLORIDE: 102 mmol/L (ref 101–111)
CO2: 27 mmol/L (ref 22–32)
Calcium: 8.9 mg/dL (ref 8.9–10.3)
Creatinine, Ser: 1.11 mg/dL — ABNORMAL HIGH (ref 0.44–1.00)
GFR calc non Af Amer: 49 mL/min — ABNORMAL LOW (ref >60)
GFR, EST AFRICAN AMERICAN: 57 mL/min — AB (ref >60)
Glucose, Bld: 97 mg/dL (ref 65–99)
POTASSIUM: 4.3 mmol/L (ref 3.5–5.1)
SODIUM: 135 mmol/L (ref 135–145)

## 2016-08-15 LAB — CBC
HCT: 39.3 % (ref 36.0–46.0)
HEMOGLOBIN: 12.4 g/dL (ref 12.0–15.0)
MCH: 28.8 pg (ref 26.0–34.0)
MCHC: 31.6 g/dL (ref 30.0–36.0)
MCV: 91.4 fL (ref 78.0–100.0)
Platelets: 256 10*3/uL (ref 150–400)
RBC: 4.3 MIL/uL (ref 3.87–5.11)
RDW: 14.1 % (ref 11.5–15.5)
WBC: 8.4 10*3/uL (ref 4.0–10.5)

## 2016-08-15 MED ORDER — OXYCODONE HCL 5 MG PO TABS: 5.0000 mg | ORAL_TABLET | ORAL | 0 refills | Status: DC | PRN

## 2016-08-15 MED ORDER — METHOCARBAMOL 500 MG PO TABS: 500.0000 mg | ORAL_TABLET | Freq: Four times a day (QID) | ORAL | 1 refills | Status: DC | PRN

## 2016-08-15 NOTE — Care Management Note (Signed)
Case Management Note  Patient Details  Name: Linda [name] MRN: [id number] Date of Birth: 09/12/1945  Subjective/Objective:                    Action/Plan:  No needs identified. Expected Discharge Date:  08/15/16               Expected Discharge Plan:  Home/Self Care  In-House Referral:     Discharge planning Services     Post Acute Care Choice:    Choice offered to:     DME Arranged:    DME Agency:     HH Arranged:    HH Agency:     Status of Service:     If discussed at [employer], dates discussed:    Additional Comments:  Marilu Favre, RN 08/15/2016, 10:18 AM

## 2016-08-15 NOTE — Anesthesia Procedure Notes (Addendum)
Anesthesia Regional Block: Pectoralis block   Pre-Anesthetic Checklist: ,, timeout performed, Correct Patient, Correct Site, Correct Laterality, Correct Procedure, Correct Position, site marked, Risks and benefits discussed, Surgical consent,  At surgeon's request and post-op pain management  Laterality: Right  Prep: chloraprep       Needles:  Injection technique: Single-shot  Needle Type: Echogenic Needle     Needle Length: 9cm  Needle Gauge: 21     Additional Needles:   Procedures: ultrasound guided,,,,,,,,  Narrative:  Start time: 08/15/2016 7:10 AM End time: 08/15/2016 7:15 AM Injection made incrementally with aspirations every 5 mL.  Performed by: Personally   Additional Notes: 30 cc 0.5% Bupivacaine with 1:200 epi injected easily

## 2016-08-15 NOTE — Discharge Summary (Signed)
Physician Discharge Summary  Patient ID: Linda [name] MRN: [id number] DOB/AGE: [birth year] 71 y.o.  Admit date: 08/14/2016 Discharge date: 08/15/2016  Admission Diagnoses: Patient Active Problem List   Diagnosis Date Noted  . Breast cancer metastasized to axillary lymph node, right ([place]) 08/14/2016  . Malignant neoplasm of upper-outer quadrant of right breast in female, estrogen receptor positive ([place]) 07/25/2016  . Hypothyroidism 05/09/2016  . Closed left femoral fracture ([place]) 05/08/2016  . COPD, group D, by GOLD 2017 classification ([place]) 01/28/2011    Discharge Diagnoses:  Active Problems:   Breast cancer metastasized to axillary lymph node, right ([place]) same as above  Discharged Condition: stable  Hospital Course: Pt was admitted to the floor following right lumpectomy and axillary lymph node dissection.  She had good pain control overnight and was ambulatory early.  She was able to void independently.  She and her family received drain teaching.  She had no evidence of hematoma on POD 1.  Her breathing was near baseline.   Consults: None  Significant Diagnostic Studies: labs: see epic  Treatments: surgery: see above  Discharge Exam: Blood pressure 127/61, pulse 60, temperature 98.5 F (36.9 C), temperature source Oral, resp. rate 17, height 5\' 5"  (1.651 m), weight 54.8 kg (120 lb 12.8 oz), SpO2 96 %. General appearance: alert, cooperative and no distress Resp: slightly rapid breathing, but at baseline from clinic appointments.   Breasts: no hematoma, incision d/c/i. Extremities: extremities normal, atraumatic, no cyanosis or edema  Disposition: 06-Home-Health Care Svc  Discharge Instructions    Call MD for:  difficulty breathing, headache or visual disturbances    Complete by:  As directed    Call MD for:  persistant nausea and vomiting    Complete by:  As directed    Call MD for:  redness, tenderness, or signs of infection (pain, swelling, redness, odor or  green/yellow discharge around incision site)    Complete by:  As directed    Call MD for:  severe uncontrolled pain    Complete by:  As directed    Call MD for:  temperature >100.4    Complete by:  As directed    Diet - low sodium heart healthy    Complete by:  As directed    Discharge instructions    Complete by:  As directed    Measure and record drain output 1-2 times per day.  Bring record to clinic.  Brief showers OK starting 5/19.   Increase activity slowly    Complete by:  As directed      Allergies as of 08/15/2016      Reactions   Aspirin    ulcer      Medication List    TAKE these medications   albuterol 108 (90 Base) MCG/ACT inhaler Commonly known as:  PROVENTIL HFA;VENTOLIN HFA Inhale 1-2 puffs into the lungs every 6 (six) hours as needed for wheezing or shortness of breath.   amLODipine 5 MG tablet Commonly known as:  NORVASC Take 5 mg by mouth daily before breakfast.   anastrozole 1 MG tablet Commonly known as:  ARIMIDEX Take 1 tablet (1 mg total) by mouth daily. What changed:  when to take this   budesonide-formoterol 160-4.5 MCG/ACT inhaler Commonly known as:  SYMBICORT Inhale 2 puffs into the lungs 2 (two) times daily.   CoQ10 200 MG Caps Take 200 mg by mouth every 14 (fourteen) days. Once every 2-3 weeks (when she remembers)   DALIRESP 500 MCG Tabs tablet Generic drug:  roflumilast TAKE ONE TABLET BY MOUTH DAILY What changed:  See the new instructions.   DALIRESP 500 MCG Tabs tablet Generic drug:  roflumilast TAKE ONE TABLET BY MOUTH ONCE DAILY What changed:  Another medication with the same name was changed. Make sure you understand how and when to take each.   levothyroxine 88 MCG tablet Commonly known as:  SYNTHROID, LEVOTHROID Take 88 mcg by mouth daily before breakfast.   losartan 100 MG tablet Commonly known as:  COZAAR Take 100 mg by mouth daily before breakfast.   meloxicam 15 MG tablet Commonly known as:  MOBIC Take 15 mg by  mouth daily before breakfast.   methocarbamol 500 MG tablet Commonly known as:  ROBAXIN Take 1 tablet (500 mg total) by mouth every 6 (six) hours as needed for muscle spasms.   mometasone-formoterol 200-5 MCG/ACT Aero Commonly known as:  DULERA Inhale 2 puffs into the lungs 2 (two) times daily.   omeprazole 20 MG capsule Commonly known as:  PRILOSEC Take 20 mg by mouth daily before breakfast.   oxyCODONE 5 MG immediate release tablet Commonly known as:  Oxy IR/ROXICODONE Take 1-2 tablets (5-10 mg total) by mouth every 4 (four) hours as needed for moderate pain.   polyethylene glycol packet Commonly known as:  MIRALAX / GLYCOLAX Take 17 g by mouth 2 (two) times daily. What changed:  when to take this  reasons to take this   pravastatin 20 MG tablet Commonly known as:  PRAVACHOL Take 20 mg by mouth daily before breakfast.   SPIRIVA RESPIMAT 2.5 MCG/ACT Aers Generic drug:  Tiotropium Bromide Monohydrate Inhale 2 puffs into the lungs daily after breakfast.   Vitamin D3 2000 units capsule Take 2,000 Units by mouth every 14 (fourteen) days. Once every 2-3 weeks (when she remembers)   ZEGERID OTC 20-1100 MG Caps capsule Generic drug:  Omeprazole-Sodium Bicarbonate Take 1 capsule by mouth daily before breakfast.      Follow-up Information    Stark Klein, MD In 2 weeks.   Specialty:  General Surgery Contact information: [address] [place] [address] [phone]           Signed: Stark Klein 08/15/2016, 9:12 AM

## 2016-08-18 ENCOUNTER — Other Ambulatory Visit: Payer: Self-pay | Admitting: Pulmonary Disease

## 2016-08-18 ENCOUNTER — Telehealth: Payer: Self-pay | Admitting: General Surgery

## 2016-08-18 NOTE — Telephone Encounter (Signed)
Discussed with patient.  Will move up follow up appt a bit closer.

## 2016-08-19 NOTE — Assessment & Plan Note (Signed)
Rt Lumpectomy 08/14/16: IDC grade 2, 1.8 cm, 2/15 LN, ER 90%, PR 60%, Ki-67 12%, HER-2 negative ratio 0.41; T1CN1 (Stage 2A)  Pathology counseling: I discussed the final pathology report of the patient provided  a copy of this report. I discussed the margins as well as lymph node surgeries. We also discussed the final staging along with previously performed ER/PR and HER-2/neu testing.  Recommendation: 1. mammoprint testing 2. Systemic chemotherapy if it is high risk 3. Adjuvant radiation 4. Followed by adjuvant antiestrogen therapy  RTC depending on mammaprint testing

## 2016-08-20 ENCOUNTER — Ambulatory Visit (HOSPITAL_BASED_OUTPATIENT_CLINIC_OR_DEPARTMENT_OTHER): Payer: PPO | Admitting: Hematology and Oncology

## 2016-08-20 ENCOUNTER — Encounter: Payer: Self-pay | Admitting: Hematology and Oncology

## 2016-08-20 DIAGNOSIS — Z170 Estrogen receptor positive status [ER+]: Secondary | ICD-10-CM

## 2016-08-20 DIAGNOSIS — C50411 Malignant neoplasm of upper-outer quadrant of right female breast: Secondary | ICD-10-CM | POA: Diagnosis not present

## 2016-08-20 DIAGNOSIS — C773 Secondary and unspecified malignant neoplasm of axilla and upper limb lymph nodes: Secondary | ICD-10-CM | POA: Diagnosis not present

## 2016-08-20 NOTE — Progress Notes (Signed)
Patient Care Team: Ernestene Kiel, MD as PCP - General (Internal Medicine) Collene Gobble, MD (Pulmonary Disease) Stark Klein, MD as Consulting Physician (Surgical Oncology) Eppie Gibson, MD as Attending Physician (Radiation Oncology) Nicholas Lose, MD as Consulting Physician (Hematology and Oncology)  DIAGNOSIS:  Encounter Diagnosis  Name Primary?  . Malignant neoplasm of upper-outer quadrant of right breast in female, estrogen receptor positive ([place])     SUMMARY OF ONCOLOGIC HISTORY:   Malignant neoplasm of upper-outer quadrant of right breast in female, estrogen receptor positive ([place])   07/03/2016 Initial Diagnosis    Right breast biopsy 10:30 position: IDC, lymphovascular invasion present, right axillary lymph node positive, grade 3, ER 90%, PR 60%, Ki-67 12%, HER-2 negative ratio 0.41; right breast mass 9 cm from nipple and 30 position: 1.7 x 1 x 1.6 cm, 2 abnormal lymph nodes right axilla largest 1.9 cm smaller 1.2 cm; T1c N1 stage II a      08/14/2016 Surgery    Rt Lumpectomy: IDC grade 2, 1.8 cm, 2/15 LN, ER 90%, PR 60%, Ki-67 12%, HER-2 negative ratio 0.41; T1CN1 (Stage 2A)       CHIEF COMPLIANT: Follow-up after recent lumpectomy and lymph node surgery  INTERVAL HISTORY: Linda [name] is a 71 year old with above-mentioned history right breast cancer who underwent lumpectomy surgery and is here today to discuss the results. She was noted to have a 1.8 cm tumor with 2 positive lymph nodes. She is recovering very well from the surgery. She does have discomfort underneath the arm.  REVIEW OF SYSTEMS:   Constitutional: Denies fevers, chills or abnormal weight loss Eyes: Denies blurriness of vision Ears, nose, mouth, throat, and face: Denies mucositis or sore throat Respiratory: Denies cough, dyspnea or wheezes Cardiovascular: Denies palpitation, chest discomfort Gastrointestinal:  Denies nausea, heartburn or change in bowel habits Skin: Denies abnormal skin  rashes Lymphatics: Denies new lymphadenopathy or easy bruising Neurological:Denies numbness, tingling or new weaknesses Behavioral/Psych: Mood is stable, no new changes  Extremities: No lower extremity edema Breast: Recent right lumpectomy and axillary node dissection All other systems were reviewed with the patient and are negative.  I have reviewed the past medical history, past surgical history, social history and family history with the patient and they are unchanged from previous note.  ALLERGIES:  is allergic to aspirin.  MEDICATIONS:  Current Outpatient Prescriptions  Medication Sig Dispense Refill  . albuterol (PROVENTIL HFA;VENTOLIN HFA) 108 (90 Base) MCG/ACT inhaler Inhale 1-2 puffs into the lungs every 6 (six) hours as needed for wheezing or shortness of breath.    Marland Kitchen amLODipine (NORVASC) 5 MG tablet Take 5 mg by mouth daily before breakfast.     . anastrozole (ARIMIDEX) 1 MG tablet Take 1 tablet (1 mg total) by mouth daily. (Patient taking differently: Take 1 mg by mouth daily after breakfast. ) 30 tablet 0  . budesonide-formoterol (SYMBICORT) 160-4.5 MCG/ACT inhaler Inhale 2 puffs into the lungs 2 (two) times daily.    . Cholecalciferol (VITAMIN D3) 2000 units capsule Take 2,000 Units by mouth every 14 (fourteen) days. Once every 2-3 weeks (when she remembers)    . Coenzyme Q10 (COQ10) 200 MG CAPS Take 200 mg by mouth every 14 (fourteen) days. Once every 2-3 weeks (when she remembers)    . DALIRESP 500 MCG TABS tablet TAKE ONE TABLET BY MOUTH DAILY (Patient taking differently: TAKE ONE TABLET BY MOUTH DAILY AFTER BREAKFAST) 30 tablet 0  . DALIRESP 500 MCG TABS tablet TAKE ONE TABLET BY MOUTH ONCE  DAILY 30 tablet 5  . levothyroxine (SYNTHROID, LEVOTHROID) 88 MCG tablet Take 88 mcg by mouth daily before breakfast.     . losartan (COZAAR) 100 MG tablet Take 100 mg by mouth daily before breakfast.     . meloxicam (MOBIC) 15 MG tablet Take 15 mg by mouth daily before breakfast.     .  methocarbamol (ROBAXIN) 500 MG tablet Take 1 tablet (500 mg total) by mouth every 6 (six) hours as needed for muscle spasms. 30 tablet 1  . mometasone-formoterol (DULERA) 200-5 MCG/ACT AERO Inhale 2 puffs into the lungs 2 (two) times daily.    Marland Kitchen omeprazole (PRILOSEC) 20 MG capsule Take 20 mg by mouth daily before breakfast.     . Omeprazole-Sodium Bicarbonate (ZEGERID OTC) 20-1100 MG CAPS capsule Take 1 capsule by mouth daily before breakfast.    . oxyCODONE (OXY IR/ROXICODONE) 5 MG immediate release tablet Take 1-2 tablets (5-10 mg total) by mouth every 4 (four) hours as needed for moderate pain. 40 tablet 0  . polyethylene glycol (MIRALAX / GLYCOLAX) packet Take 17 g by mouth 2 (two) times daily. (Patient taking differently: Take 17 g by mouth daily as needed (for constipation.). ) 14 each 0  . pravastatin (PRAVACHOL) 20 MG tablet Take 20 mg by mouth daily before breakfast.     . SPIRIVA RESPIMAT 2.5 MCG/ACT AERS Inhale 2 puffs into the lungs daily after breakfast.     No current facility-administered medications for this visit.     PHYSICAL EXAMINATION: ECOG PERFORMANCE STATUS: 1 - Symptomatic but completely ambulatory  Vitals:   08/20/16 1248  BP: (!) 190/80  Pulse: 96  Resp: 18  Temp: 98.2 F (36.8 C)   Filed Weights   08/20/16 1248  Weight: 121 lb (54.9 kg)    GENERAL:alert, no distress and comfortable SKIN: skin color, texture, turgor are normal, no rashes or significant lesions EYES: normal, Conjunctiva are pink and non-injected, sclera clear OROPHARYNX:no exudate, no erythema and lips, buccal mucosa, and tongue normal  NECK: supple, thyroid normal size, non-tender, without nodularity LYMPH:  no palpable lymphadenopathy in the cervical, axillary or inguinal LUNGS: clear to auscultation and percussion with normal breathing effort HEART: regular rate & rhythm and no murmurs and no lower extremity edema ABDOMEN:abdomen soft, non-tender and normal bowel  sounds MUSCULOSKELETAL:no cyanosis of digits and no clubbing  NEURO: alert & oriented x 3 with fluent speech, no focal motor/sensory deficits EXTREMITIES: No lower extremity edema  LABORATORY DATA:  I have reviewed the data as listed   Chemistry      Component Value Date/Time   NA 135 08/15/2016 0605   K 4.3 08/15/2016 0605   CL 102 08/15/2016 0605   CO2 27 08/15/2016 0605   BUN 15 08/15/2016 0605   CREATININE 1.11 (H) 08/15/2016 0605      Component Value Date/Time   CALCIUM 8.9 08/15/2016 0605   ALKPHOS 63 05/09/2016 0523   AST 23 05/09/2016 0523   ALT 18 05/09/2016 0523   BILITOT 0.7 05/09/2016 0523       Lab Results  Component Value Date   WBC 8.4 08/15/2016   HGB 12.4 08/15/2016   HCT 39.3 08/15/2016   MCV 91.4 08/15/2016   PLT 256 08/15/2016   NEUTROABS 14.9 (H) 05/08/2016    ASSESSMENT & PLAN:  Malignant neoplasm of upper-outer quadrant of right breast in female, estrogen receptor positive ([place]) Rt Lumpectomy 08/14/16: IDC grade 2, 1.8 cm, 2/15 LN, ER 90%, PR 60%, Ki-67 12%, HER-2 negative  ratio 0.41; T1CN1 (Stage 2A)  Pathology counseling: I discussed the final pathology report of the patient provided  a copy of this report. I discussed the margins as well as lymph node surgeries. We also discussed the final staging along with previously performed ER/PR and HER-2/neu testing.  Recommendation: 1. mammoprint testing 2. Systemic chemotherapy if it is high risk 3. Adjuvant radiation (Patient has lots of reservations regarding radiation therapy and its risks and benefits. She is worried that given her COPD she could have lung damage from radiation.) 4. Followed by adjuvant antiestrogen therapy  RTC depending on mammaprint testing  I spent 25 minutes talking to the patient of which more than half was spent in counseling and coordination of care.  No orders of the defined types were placed in this encounter.  The patient has a good understanding of the overall  plan. she agrees with it. she will call with any problems that may develop before the next visit here.   Gudena, Vinay K, MD 08/20/16    

## 2016-08-21 ENCOUNTER — Ambulatory Visit: Payer: PPO | Admitting: Hematology and Oncology

## 2016-08-21 ENCOUNTER — Telehealth: Payer: Self-pay | Admitting: *Deleted

## 2016-08-21 NOTE — Telephone Encounter (Signed)
Received order for Mammaprint testing Requisition sent to pathology and [facility].

## 2016-08-26 ENCOUNTER — Other Ambulatory Visit: Payer: Self-pay | Admitting: *Deleted

## 2016-08-26 DIAGNOSIS — C50411 Malignant neoplasm of upper-outer quadrant of right female breast: Secondary | ICD-10-CM

## 2016-08-26 DIAGNOSIS — Z170 Estrogen receptor positive status [ER+]: Principal | ICD-10-CM

## 2016-08-27 ENCOUNTER — Telehealth: Payer: Self-pay | Admitting: *Deleted

## 2016-08-27 ENCOUNTER — Encounter (HOSPITAL_COMMUNITY): Payer: Self-pay

## 2016-08-27 NOTE — Telephone Encounter (Signed)
Spoke to patient regarding her high risk mammaprint results.  Offered appointment for today to come in to discuss results with Dr. Lindi Adie and patient cannot make it today.  Appointment confirmed for 09/01/16 at 1045am.

## 2016-08-27 NOTE — Telephone Encounter (Signed)
Received mammaprint results of high risk.  Copy given to Dr. Lindi Adie.

## 2016-08-31 NOTE — Assessment & Plan Note (Signed)
Rt Lumpectomy 08/14/16: IDC grade 2, 1.8 cm, 2/15 LN, ER 90%, PR 60%, Ki-67 12%, HER-2 negative ratio 0.41; T1CN1 (Stage 2A)  Mammaprint: High Risk Plan: Systemic chemo Plan: TC X 4 cycles given her co morbidities Chemotherapy Counseling: I discussed the risks and benefits of chemotherapy including the risks of nausea/ vomiting, risk of infection from low WBC count, fatigue due to chemo or anemia, bruising or bleeding due to low platelets, mouth sores, loss/ change in taste and decreased appetite. Liver and kidney function will be monitored through out chemotherapy as abnormalities in liver and kidney function may be a side effect of treatment. Risk of permanent bone marrow dysfunction and leukemia due to chemo were also discussed.  Plan: Chemo class [place] RTC in 2 weeks to start chemo

## 2016-09-01 ENCOUNTER — Encounter: Payer: Self-pay | Admitting: Hematology and Oncology

## 2016-09-01 ENCOUNTER — Ambulatory Visit (HOSPITAL_BASED_OUTPATIENT_CLINIC_OR_DEPARTMENT_OTHER): Payer: PPO | Admitting: Hematology and Oncology

## 2016-09-01 ENCOUNTER — Telehealth: Payer: Self-pay | Admitting: Pulmonary Disease

## 2016-09-01 VITALS — BP 172/77 | HR 85 | Temp 97.7°F | Resp 19 | Ht 65.0 in | Wt 119.6 lb

## 2016-09-01 DIAGNOSIS — C50411 Malignant neoplasm of upper-outer quadrant of right female breast: Secondary | ICD-10-CM | POA: Diagnosis not present

## 2016-09-01 DIAGNOSIS — C773 Secondary and unspecified malignant neoplasm of axilla and upper limb lymph nodes: Secondary | ICD-10-CM | POA: Diagnosis not present

## 2016-09-01 DIAGNOSIS — Z780 Asymptomatic menopausal state: Secondary | ICD-10-CM

## 2016-09-01 DIAGNOSIS — Z170 Estrogen receptor positive status [ER+]: Secondary | ICD-10-CM | POA: Diagnosis not present

## 2016-09-01 MED ORDER — ANASTROZOLE 1 MG PO TABS: 1.0000 mg | ORAL_TABLET | Freq: Every day | ORAL | 3 refills | Status: DC

## 2016-09-01 NOTE — Telephone Encounter (Signed)
Spoke with the pt   She states Dr Barry Dienes had rec chemo and RT after her breast surgery  She is afraid of RT due to her lung problem and wants to know what PM thinks  I asked that she schedule appt to discuss, and she is already scheduled for one 10/27/16 and needs to know an answer sooner  Please advise, thanks

## 2016-09-01 NOTE — Progress Notes (Signed)
Patient Care Team: Ernestene Kiel, MD as PCP - General (Internal Medicine) Collene Gobble, MD (Pulmonary Disease) Stark Klein, MD as Consulting Physician (Surgical Oncology) Eppie Gibson, MD as Attending Physician (Radiation Oncology) Nicholas Lose, MD as Consulting Physician (Hematology and Oncology)  DIAGNOSIS:  Encounter Diagnoses  Name Primary?  . Malignant neoplasm of upper-outer quadrant of right breast in female, estrogen receptor positive ([place])   . Post-menopausal Yes    SUMMARY OF ONCOLOGIC HISTORY:   Malignant neoplasm of upper-outer quadrant of right breast in female, estrogen receptor positive ([place])   07/03/2016 Initial Diagnosis    Right breast biopsy 10:30 position: IDC, lymphovascular invasion present, right axillary lymph node positive, grade 3, ER 90%, PR 60%, Ki-67 12%, HER-2 negative ratio 0.41; right breast mass 9 cm from nipple and 30 position: 1.7 x 1 x 1.6 cm, 2 abnormal lymph nodes right axilla largest 1.9 cm smaller 1.2 cm; T1c N1 stage II a      08/14/2016 Surgery    Rt Lumpectomy: IDC grade 2, 1.8 cm, 2/15 LN, ER 90%, PR 60%, Ki-67 12%, HER-2 negative ratio 0.41; T1CN1 (Stage 2A)      08/18/2016 Miscellaneous    Mammaprint high risk luminal B: Patient refused chemotherapy. 5 year risk of recurrence 20%, 10 year risk of recurrence 29%       CHIEF COMPLIANT: Patient is here to discuss Mammaprint result  INTERVAL HISTORY: Linda [name] is a 71 year old with above-mentioned history of right breast cancer treated with lumpectomy. She had one positive lymph node. She underwent Mammaprint testing and is here to discuss the results accompanied by her family. She continues to have severe COPD symptoms with pursed lip breathing.  REVIEW OF SYSTEMS:   Constitutional: Denies fevers, chills or abnormal weight loss Eyes: Denies blurriness of vision Ears, nose, mouth, throat, and face: Denies mucositis or sore throat Respiratory: Severe shortness of breath  to minimal exertion Cardiovascular: Denies palpitation, chest discomfort Gastrointestinal:  Denies nausea, heartburn or change in bowel habits Skin: Denies abnormal skin rashes Lymphatics: Denies new lymphadenopathy or easy bruising Neurological:Denies numbness, tingling or new weaknesses Behavioral/Psych: Mood is stable, no new changes  Extremities: No lower extremity edema Breast:  denies any pain or lumps or nodules in either breasts All other systems were reviewed with the patient and are negative.  I have reviewed the past medical history, past surgical history, social history and family history with the patient and they are unchanged from previous note.  ALLERGIES:  is allergic to aspirin.  MEDICATIONS:  Current Outpatient Prescriptions  Medication Sig Dispense Refill  . albuterol (PROVENTIL HFA;VENTOLIN HFA) 108 (90 Base) MCG/ACT inhaler Inhale 1-2 puffs into the lungs every 6 (six) hours as needed for wheezing or shortness of breath.    Marland Kitchen amLODipine (NORVASC) 5 MG tablet Take 5 mg by mouth daily before breakfast.     . anastrozole (ARIMIDEX) 1 MG tablet Take 1 tablet (1 mg total) by mouth daily. 90 tablet 3  . budesonide-formoterol (SYMBICORT) 160-4.5 MCG/ACT inhaler Inhale 2 puffs into the lungs 2 (two) times daily.    . Cholecalciferol (VITAMIN D3) 2000 units capsule Take 2,000 Units by mouth every 14 (fourteen) days. Once every 2-3 weeks (when she remembers)    . Coenzyme Q10 (COQ10) 200 MG CAPS Take 200 mg by mouth every 14 (fourteen) days. Once every 2-3 weeks (when she remembers)    . DALIRESP 500 MCG TABS tablet TAKE ONE TABLET BY MOUTH DAILY (Patient taking differently: TAKE ONE  TABLET BY MOUTH DAILY AFTER BREAKFAST) 30 tablet 0  . DALIRESP 500 MCG TABS tablet TAKE ONE TABLET BY MOUTH ONCE DAILY 30 tablet 5  . levothyroxine (SYNTHROID, LEVOTHROID) 88 MCG tablet Take 88 mcg by mouth daily before breakfast.     . losartan (COZAAR) 100 MG tablet Take 100 mg by mouth daily  before breakfast.     . meloxicam (MOBIC) 15 MG tablet Take 15 mg by mouth daily before breakfast.     . methocarbamol (ROBAXIN) 500 MG tablet Take 1 tablet (500 mg total) by mouth every 6 (six) hours as needed for muscle spasms. 30 tablet 1  . mometasone-formoterol (DULERA) 200-5 MCG/ACT AERO Inhale 2 puffs into the lungs 2 (two) times daily.    Marland Kitchen omeprazole (PRILOSEC) 20 MG capsule Take 20 mg by mouth daily before breakfast.     . Omeprazole-Sodium Bicarbonate (ZEGERID OTC) 20-1100 MG CAPS capsule Take 1 capsule by mouth daily before breakfast.    . oxyCODONE (OXY IR/ROXICODONE) 5 MG immediate release tablet Take 1-2 tablets (5-10 mg total) by mouth every 4 (four) hours as needed for moderate pain. 40 tablet 0  . polyethylene glycol (MIRALAX / GLYCOLAX) packet Take 17 g by mouth 2 (two) times daily. (Patient taking differently: Take 17 g by mouth daily as needed (for constipation.). ) 14 each 0  . pravastatin (PRAVACHOL) 20 MG tablet Take 20 mg by mouth daily before breakfast.     . SPIRIVA RESPIMAT 2.5 MCG/ACT AERS Inhale 2 puffs into the lungs daily after breakfast.     No current facility-administered medications for this visit.     PHYSICAL EXAMINATION: ECOG PERFORMANCE STATUS: 1 - Symptomatic but completely ambulatory  Vitals:   09/01/16 1115  BP: (!) 172/77  Pulse: 85  Resp: 19  Temp: 97.7 F (36.5 C)   Filed Weights   09/01/16 1115  Weight: 119 lb 9.6 oz (54.3 kg)    GENERAL:alert, no distress and comfortable SKIN: skin color, texture, turgor are normal, no rashes or significant lesions EYES: normal, Conjunctiva are pink and non-injected, sclera clear OROPHARYNX:no exudate, no erythema and lips, buccal mucosa, and tongue normal  NECK: supple, thyroid normal size, non-tender, without nodularity LYMPH:  no palpable lymphadenopathy in the cervical, axillary or inguinal LUNGS: Diminished breath sounds bilaterally HEART: regular rate & rhythm and no murmurs and no lower  extremity edema ABDOMEN:abdomen soft, non-tender and normal bowel sounds MUSCULOSKELETAL:no cyanosis of digits and no clubbing  NEURO: alert & oriented x 3 with fluent speech, no focal motor/sensory deficits EXTREMITIES: No lower extremity edema  LABORATORY DATA:  I have reviewed the data as listed   Chemistry      Component Value Date/Time   NA 135 08/15/2016 0605   K 4.3 08/15/2016 0605   CL 102 08/15/2016 0605   CO2 27 08/15/2016 0605   BUN 15 08/15/2016 0605   CREATININE 1.11 (H) 08/15/2016 0605      Component Value Date/Time   CALCIUM 8.9 08/15/2016 0605   ALKPHOS 63 05/09/2016 0523   AST 23 05/09/2016 0523   ALT 18 05/09/2016 0523   BILITOT 0.7 05/09/2016 0523       Lab Results  Component Value Date   WBC 8.4 08/15/2016   HGB 12.4 08/15/2016   HCT 39.3 08/15/2016   MCV 91.4 08/15/2016   PLT 256 08/15/2016   NEUTROABS 14.9 (H) 05/08/2016    ASSESSMENT & PLAN:  Malignant neoplasm of upper-outer quadrant of right breast in female, estrogen receptor positive (  [place]) Rt Lumpectomy 08/14/16: IDC grade 2, 1.8 cm, 2/15 LN, ER 90%, PR 60%, Ki-67 12%, HER-2 negative ratio 0.41; T1CN1 (Stage 2A)  Mammaprint: High Risk: Risk of recurrence 20% at 5 years and 29% of 10 years. Chemotherapy would most likely decrease the risk of recurrence at 5 years to 5.4%.  I discussed the pros and cons of systemic chemotherapy including the risk of hair loss, infection, neuropathy, fatigue, loss of taste, decreased blood counts, fatigue, loss of taste. After listening to the risks, patient decided not to undergo chemotherapy.  Patient is still debating about whether or not she wants to undergo radiation. She is very concerned that the radiation could decrease in lung function. She wants to make another appointment with radiation oncology to discuss this further. I will ask our navigators to help set her up with radiation. In the interim I recommended that she resume her antiestrogen therapy with  anastrozole which she tolerated very well previously.  I spent 45 minutes talking to the patient of which more than half was spent in counseling and coordination of care.  Orders Placed This Encounter  Procedures  . DG Bone Density    Standing Status:   Future    Standing Expiration Date:   09/01/2017    Order Specific Question:   Reason for Exam (SYMPTOM  OR DIAGNOSIS REQUIRED)    Answer:   post menopausal osteoporosis    Order Specific Question:   Preferred imaging location?    Answer:   [facility]   The patient has a good understanding of the overall plan. she agrees with it. she will call with any problems that may develop before the next visit here.   Rulon Eisenmenger, MD 09/01/16

## 2016-09-02 NOTE — Telephone Encounter (Signed)
I called and left a message on voicemail requesting call back.  PM

## 2016-09-02 NOTE — Progress Notes (Signed)
Location of Breast Cancer: Right Breast  Histology per Pathology Report:  07/09/16 Diagnosis 1. Breast, right, needle core biopsy, 10:30 o'clock - INVASIVE DUCTAL CARCINOMA. - LYMPHOVASCULAR INVASION IS IDENTIFIED. - SEE COMMENT. 2. Lymph node, needle/core biopsy, right axilla - METASTATIC CARCINOMA IN 1 OF 1 LYMPH NODE (1/1).  Receptor Status: ER(90%), PR (60%), Her2-neu (NEG), Ki-(12%)  08/14/16 Diagnosis 1. Breast, lumpectomy, Right - INVASIVE DUCTAL CARCINOMA, GRADE II/III, SPANNING 1.8 CM. - INVASIVE CARCINOMA IS FOCALLY 0.1 CM TO THE ANTERIOR MARGIN OF SPECIMEN #1. - SEE ONCOLOGY TABLE BELOW. 2. Lymph nodes, regional resection, Right axillary contents - METASTATIC CARCINOMA IN 2 OF 12 LYMPH NODES (2/12). 3. Lymph nodes, regional resection, Right axillary, Rotter's - THERE IS NO EVIDENCE OF CARCINOMA IN 3 OF 3 LYMPH NODES (0/3). 4. Soft tissue mass, biopsy, Right posterior axillary - MATURE ADIPOSE TISSUE, CONSISTENT WITH LIPOMA. 5. Soft tissue mass, simple excision, Right arm - MATURE ADIPOSE TISSUE, CONSISTENT WITH LIPOMA.  Did patient present with symptoms or was this found on screening mammography?: Dr. Lindi Adie documents 07/25/16: She felt a lump in the right breast about a year ago and thought it was benign. 6 months ago she found a lump in the right axilla. She did not think too much about it until recently. She then underwent a mammogram and ultrasound which revealed a mass in the right breast and axillary lymphadenopathy.   Past/Anticipated interventions by surgeon, if any: 08/14/16 Dr. Barry Dienes Right Breast Lumpectomy with Axillary lymph node dissection, excision of right arm mass 2.5 cm, subcutaneous, excision of right posterior axillary mass 2 cm  Past/Anticipated interventions by medical oncology, if any:  09/01/16 Dr. Lindi Adie Mammaprint: High Risk: Risk of recurrence 20% at 5 years and 29% of 10 years. Chemotherapy would most likely decrease the risk of  recurrence at 5 years to 5.4%.  I discussed the pros and cons of systemic chemotherapy including the risk of hair loss, infection, neuropathy, fatigue, loss of taste, decreased blood counts, fatigue, loss of taste. After listening to the risks, patient decided not to undergo chemotherapy   Lymphedema issues, if any:  She denies. She occasionally will feel a swollen area to her Right Axilla, but it comes and goes.   Pain issues, if any: She has pain to her Right Breast. She also reports pain to her Right axilla region, and to the underneath area of her Right arm to her Right Elbow. She has sharp pain at times over her Right elbow. She had relief from oxycodone, but finished the prescription.    SAFETY ISSUES:  Prior radiation? Radioactive iodine to her thyroid early 1990's  Pacemaker/ICD? No  Possible current pregnancy? No  Is the patient on methotrexate? No  Current Complaints / other details:   09/01/16 Dr. Lindi Adie Patient is still debating about whether or not she wants to undergo radiation. She is very concerned that the radiation could decrease in lung function. She wants to make another appointment with radiation oncology to discuss this further.  BP (!) 153/91   Pulse 77   Temp 98.2 F (36.8 C)   Ht _0  (1.651 m)   Wt 121 lb 3.2 oz (55 kg)   SpO2 98% Comment: room air  BMI 20.17 kg/m    Wt Readings from Last 3 Encounters:  09/03/16 121 lb 3.2 oz (55 kg)  09/01/16 119 lb 9.6 oz (54.3 kg)  08/20/16 121 lb (54.9 kg)

## 2016-09-03 ENCOUNTER — Encounter: Payer: Self-pay | Admitting: Radiation Oncology

## 2016-09-03 ENCOUNTER — Ambulatory Visit
Admission: RE | Admit: 2016-09-03 | Discharge: 2016-09-03 | Disposition: A | Discharge status: Home / Self Care | Payer: PPO | Source: Ambulatory Visit | Attending: Radiation Oncology | Admitting: Radiation Oncology

## 2016-09-03 VITALS — BP 153/91 | HR 77 | Temp 98.2°F | Ht 65.0 in | Wt 121.2 lb

## 2016-09-03 DIAGNOSIS — C50411 Malignant neoplasm of upper-outer quadrant of right female breast: Secondary | ICD-10-CM

## 2016-09-03 DIAGNOSIS — Z170 Estrogen receptor positive status [ER+]: Secondary | ICD-10-CM | POA: Diagnosis not present

## 2016-09-03 DIAGNOSIS — C773 Secondary and unspecified malignant neoplasm of axilla and upper limb lymph nodes: Secondary | ICD-10-CM | POA: Diagnosis not present

## 2016-09-03 DIAGNOSIS — Z9889 Other specified postprocedural states: Secondary | ICD-10-CM | POA: Diagnosis not present

## 2016-09-03 DIAGNOSIS — Z510 Encounter for antineoplastic radiation therapy: Secondary | ICD-10-CM | POA: Diagnosis not present

## 2016-09-03 NOTE — Progress Notes (Signed)
Radiation Oncology         [phone] ________________________________  Name: Linda [name] MRN: [id number]  Date: 09/03/2016  DOB: [birth year]  Follow-Up Visit Note  Outpatient  CC: Ernestene Kiel, MD  Nicholas Lose, MD  Diagnosis:      ICD-10-CM   1. Malignant neoplasm of upper-outer quadrant of right breast in female, estrogen receptor positive ([place]) C50.411    Z17.0     Cancer Staging Carcinoma of upper-outer quadrant of right breast in female, estrogen receptor positive ([place]) Staging form: Breast, AJCC 8th Edition - Clinical: No stage assigned - Unsigned - Pathologic: Stage IA (pT1c, pN1a, cM0, G2, ER: Positive, PR: Positive, HER2: Negative) - Signed by Eppie Gibson, MD on 09/03/2016   CHIEF COMPLAINT: Here to discuss management of right breast cancer  Narrative:  The patient returns today for follow-up.     Since consultation, she underwent lumpectomy and axillary dissection by Dr Barry Dienes on 08-14-16; this revealed at 1.8cm Grade II invasive ductal carcinoma  tumor with close margins (focally 0.1cm anteriorly) and 2/15 positive nodes.  Per Dr. Geralyn Flash notes, "Mammaprint high risk luminal B: Patient refused chemotherapy. 5 year risk of recurrence 20%, 10 year risk of recurrence 29%."  She is concerned about lung function implications from radiotherapy. She presents today to discuss.  Her daughter is with her.  Patient is concerned about her COPD, and reports that she is close to being O2 dependent but has avoided supplementation up until now. She follows with pulmonology.  She still has severe pain in her right breast/axilla post operatively.       ALLERGIES:  is allergic to aspirin.  Meds: Current Outpatient Prescriptions  Medication Sig Dispense Refill  . albuterol (PROVENTIL HFA;VENTOLIN HFA) 108 (90 Base) MCG/ACT inhaler Inhale 1-2 puffs into the lungs every 6 (six) hours as needed for wheezing or shortness of breath.    Marland Kitchen amLODipine (NORVASC) 5 MG tablet Take  5 mg by mouth daily before breakfast.     . anastrozole (ARIMIDEX) 1 MG tablet Take 1 tablet (1 mg total) by mouth daily. 90 tablet 3  . budesonide-formoterol (SYMBICORT) 160-4.5 MCG/ACT inhaler Inhale 2 puffs into the lungs 2 (two) times daily.    . Cholecalciferol (VITAMIN D3) 2000 units capsule Take 2,000 Units by mouth every 14 (fourteen) days. Once every 2-3 weeks (when she remembers)    . DALIRESP 500 MCG TABS tablet TAKE ONE TABLET BY MOUTH DAILY (Patient taking differently: TAKE ONE TABLET BY MOUTH DAILY AFTER BREAKFAST) 30 tablet 0  . DALIRESP 500 MCG TABS tablet TAKE ONE TABLET BY MOUTH ONCE DAILY 30 tablet 5  . levothyroxine (SYNTHROID, LEVOTHROID) 88 MCG tablet Take 88 mcg by mouth daily before breakfast.     . losartan (COZAAR) 100 MG tablet Take 100 mg by mouth daily before breakfast.     . meloxicam (MOBIC) 15 MG tablet Take 15 mg by mouth daily before breakfast.     . methocarbamol (ROBAXIN) 500 MG tablet Take 1 tablet (500 mg total) by mouth every 6 (six) hours as needed for muscle spasms. 30 tablet 1  . mometasone-formoterol (DULERA) 200-5 MCG/ACT AERO Inhale 2 puffs into the lungs 2 (two) times daily.    Earney Navy Bicarbonate (ZEGERID OTC) 20-1100 MG CAPS capsule Take 1 capsule by mouth daily before breakfast.    . polyethylene glycol (MIRALAX / GLYCOLAX) packet Take 17 g by mouth 2 (two) times daily. (Patient taking differently: Take 17 g by mouth daily as needed (for  constipation.). ) 14 each 0  . pravastatin (PRAVACHOL) 20 MG tablet Take 20 mg by mouth daily before breakfast.     . Coenzyme Q10 (COQ10) 200 MG CAPS Take 200 mg by mouth every 14 (fourteen) days. Once every 2-3 weeks (when she remembers)    . omeprazole (PRILOSEC) 20 MG capsule Take 20 mg by mouth daily before breakfast.     . oxyCODONE (OXY IR/ROXICODONE) 5 MG immediate release tablet Take 1-2 tablets (5-10 mg total) by mouth every 4 (four) hours as needed for moderate pain. (Patient not taking: Reported  on 09/03/2016) 40 tablet 0  . SPIRIVA RESPIMAT 2.5 MCG/ACT AERS Inhale 2 puffs into the lungs daily after breakfast.     No current facility-administered medications for this encounter.     Physical Findings:  height is 5' 5"  (1.651 m) and weight is 121 lb 3.2 oz (55 kg). Her temperature is 98.2 F (36.8 C). Her blood pressure is 153/91 (abnormal) and her pulse is 77. Her oxygen saturation is 98%. .     General: Alert and oriented, in no acute distress HEENT: Head is normocephalic Psychiatric: Judgment and insight are intact. Affect is appropriate. Breast exam reveals sutures still in place in UOQ right breast.  She jumps when I touch her breast.  I cannot palpate it due to pain.  No erythema or excessive swelling visible  Lab Findings: Lab Results  Component Value Date   WBC 8.4 08/15/2016   HGB 12.4 08/15/2016   HCT 39.3 08/15/2016   MCV 91.4 08/15/2016   PLT 256 08/15/2016      Radiographic Findings: Nm Bone Scan Whole Body  Result Date: 08/06/2016 CLINICAL DATA:  RIGHT breast cancer with positive lymph nodes, pretreatment, history of LEFT hip fracture with THR 6 months ago, prior RIGHT ankle surgery EXAM: NUCLEAR MEDICINE WHOLE BODY BONE SCAN TECHNIQUE: Whole body anterior and posterior images were obtained approximately 3 hours after intravenous injection of radiopharmaceutical. RADIOPHARMACEUTICALS:  20.9 mCi Technetium-57mMDP IV COMPARISON:  None Correlation: CT chest abdomen pelvis 08/01/2016 FINDINGS: Photopenic defect at LEFT hip consistent with LEFT hip prosthesis. Mild uptake of tracer is seen surrounding the femoral and acetabular components of LEFT hip prosthesis, nonspecific, may be related to recent placement. Uptake at the distal RIGHT tibia and fibula compatible with history of prior trauma and surgery. Uptake in the lumbar spine at approximately L2, nonspecific ; this corresponds to significant degenerative disc disease changes on prior CT. No additional sites of  abnormal osseous tracer accumulation identified to suggest osseous metastatic disease. Scattered articular type uptake at multiple joints likely reflects mild degenerative changes. Expected urinary tract and soft tissue distribution of tracer. IMPRESSION: Uptake in lumbar spine at approximately L2, nonspecific but corresponding to degenerative disc disease changes since prior CT. Uptake surrounding LEFT hip prosthesis likely reflecting recent surgery. Uptake distal RIGHT tibia and fibula corresponding to prior surgery. No definite scintigraphic evidence of osseous metastatic disease. Electronically Signed   By: MLavonia DanaM.D.   On: 08/06/2016 15:26    Impression/Plan: Right breast cancer, clinically and pathologically node + She has declined chemotherapy.  We discussed adjuvant radiotherapy today.  I recommend radiotherapy over 6 weeks to the right breast and regional nodes in order to reduce risk of locoregional recurrence by 2/3.  The risks, benefits and side effects of this treatment were discussed in detail.  She understands that radiotherapy is associated with skin irritation and fatigue in the acute setting. Late effects can include cosmetic changes  and rare injury to internal organs. I explained that her COPD is not a contraindication to treatment, and I will try to shield her lung tissue as much as I can.   She is enthusiastic about proceeding with treatment. A consent form has been signed and placed in her chart.  A total of 5 medically necessary complex treatment devices will be fabricated and supervised by me: 4 fields with MLCs for custom blocks to protect heart, and lungs;  and, a Vac-lok. MORE COMPLEX DEVICES MAY BE MADE IN DOSIMETRY FOR FIELD IN FIELD BEAMS FOR DOSE HOMOGENEITY.  I have requested : 3D Simulation which is medically necessary to give adequate dose to at risk tissues while sparing lungs and heart.  I have requested a DVH of the following structures: lungs, heart, right  lumpectomy cavity.    The patient will receive 50 Gy in 25 fractions to the Right breast and 45-50 Gy to the regional nodes with 4 fields.  This will be followed by a boost.  She declines refill on pain meds today.  Our scheduler will help her get a followup with Dr Barry Dienes as her reported pain is truly much more than expected 3 weeks post operatively.  I spent 30 minutes  face to face with the patient and more than 50% of that time was spent in counseling and/or coordination of care. _____________________________________   Eppie Gibson, MD

## 2016-09-04 ENCOUNTER — Other Ambulatory Visit: Payer: Self-pay

## 2016-09-04 ENCOUNTER — Other Ambulatory Visit: Payer: Self-pay | Admitting: Oncology

## 2016-09-04 DIAGNOSIS — E2839 Other primary ovarian failure: Secondary | ICD-10-CM

## 2016-09-04 NOTE — Telephone Encounter (Signed)
Pt returning call.Linda [name]

## 2016-09-04 NOTE — Telephone Encounter (Signed)
Pt aware that we will let Dr Vaughan Browner know that she is returning his call.   PM please advise thanks.

## 2016-09-04 NOTE — Telephone Encounter (Signed)
I spoke with the patient about her planned radiation therapy for breast cancer. I told her that there are risks of worsening lung function from radiation but I think the risk/benefits would favour proceeding with the radiation therapy.   She has been hospitalized and was apparently sent on dulera instead of the spiriva. She is using both the dulera and symbicort which is not right. We will try to see her in clinic soon to straighten out her inhalers.  Please remind her to get all her inhalers with her for the clinic visit to review.   PM

## 2016-09-04 NOTE — Telephone Encounter (Signed)
lmtcb x1 for pt. Pt can be scheduled on 09/10/16. Please remind pt to bring all inhalers to appt. Thanks.

## 2016-09-04 NOTE — Telephone Encounter (Signed)
Scheduled patient with PM on 09/10/16 @ 2pm - pt aware to bring inhalers -pt needs nothing further -pr

## 2016-09-05 ENCOUNTER — Telehealth: Payer: Self-pay | Admitting: *Deleted

## 2016-09-05 NOTE — Telephone Encounter (Signed)
CALLED PATIENT TO UPDATE ABOUT AN APPT. TO SEE DR. BYERLY, INFORMED PATIENT THAT THE PHONE SYSTEM IS DOWN @ [facility], AND THAT THEY WILL CALL HER NEXT WEEK WITH AN APPT.

## 2016-09-09 ENCOUNTER — Ambulatory Visit: Payer: Self-pay | Admitting: Radiation Oncology

## 2016-09-09 ENCOUNTER — Telehealth: Payer: Self-pay | Admitting: *Deleted

## 2016-09-09 NOTE — Telephone Encounter (Signed)
THIS PT. SAW DR. BYERLY TODAY PER DR. BYERLY'S OFFICE STAFF

## 2016-09-10 ENCOUNTER — Encounter: Payer: Self-pay | Admitting: Pulmonary Disease

## 2016-09-10 ENCOUNTER — Ambulatory Visit (INDEPENDENT_AMBULATORY_CARE_PROVIDER_SITE_OTHER): Payer: PPO | Admitting: Pulmonary Disease

## 2016-09-10 VITALS — BP 130/80 | HR 88 | Ht 64.5 in | Wt 121.2 lb

## 2016-09-10 DIAGNOSIS — J449 Chronic obstructive pulmonary disease, unspecified: Secondary | ICD-10-CM

## 2016-09-10 MED ORDER — SPIRIVA RESPIMAT 2.5 MCG/ACT IN AERS: 2.0000 | INHALATION_SPRAY | Freq: Every day | RESPIRATORY_TRACT | 5 refills | Status: DC

## 2016-09-10 MED ORDER — ALBUTEROL SULFATE HFA 108 (90 BASE) MCG/ACT IN AERS: 1.0000 | INHALATION_SPRAY | Freq: Four times a day (QID) | RESPIRATORY_TRACT | 2 refills | Status: DC | PRN

## 2016-09-10 NOTE — Progress Notes (Signed)
Linda [name]    [id number]    1945-08-13  Primary Care Physician:Prochnau, Chrys Racer, MD  Referring Physician: Ernestene Kiel, MD [address] [place], [place] [address]  Chief complaint:   Follow up for  COPD GOLD D  HPI: Linda [name] is a 71 year old with COPD (GOLD B, CAT score 25, multiple exacerbation over past year, FEV1 41%). She was seen by Dr. Lamonte Sakai in 2012. She has not followed up in our office since then. She has been maintained on Spiriva and Symbicort. His symptoms have been stable until this year when she has several exacerbations over the winter requiring courses of antibiotics and steroids. Her primary care physician has referred her back to [place] for further management. Her chief complaint is dyspnea on exertion, loss of energy, fatigue,  chronic cough with sputum production.   Interim History: She's had a recent diagnosis of right breast cancer and underwent lumpectomy. She was offered chemotherapy but declined due to concerns about side effects. She is scheduled to start radiation and is concerned about the effects on the lung After last hospitalization she was given [facility] and she is using both Symbicort and Dulera currently. She stopped using the Spiriva. She has dyspnea on exertion which is worsened due to anxiety regarding recent medical developments.  Outpatient Encounter Prescriptions as of 09/10/2016  Medication Sig  . albuterol (PROVENTIL HFA;VENTOLIN HFA) 108 (90 Base) MCG/ACT inhaler Inhale 1-2 puffs into the lungs every 6 (six) hours as needed for wheezing or shortness of breath.  Marland Kitchen amLODipine (NORVASC) 5 MG tablet Take 5 mg by mouth daily before breakfast.   . anastrozole (ARIMIDEX) 1 MG tablet Take 1 tablet (1 mg total) by mouth daily.  . budesonide-formoterol (SYMBICORT) 160-4.5 MCG/ACT inhaler Inhale 2 puffs into the lungs 2 (two) times daily.  . Cholecalciferol (VITAMIN D3) 2000 units capsule Take 2,000 Units by mouth every 14 (fourteen) days.  Once every 2-3 weeks (when she remembers)  . DALIRESP 500 MCG TABS tablet TAKE ONE TABLET BY MOUTH DAILY (Patient taking differently: TAKE ONE TABLET BY MOUTH DAILY AFTER BREAKFAST)  . DALIRESP 500 MCG TABS tablet TAKE ONE TABLET BY MOUTH ONCE DAILY  . levothyroxine (SYNTHROID, LEVOTHROID) 88 MCG tablet Take 88 mcg by mouth daily before breakfast.   . losartan (COZAAR) 100 MG tablet Take 100 mg by mouth daily before breakfast.   . meloxicam (MOBIC) 15 MG tablet Take 15 mg by mouth daily before breakfast.   . mometasone-formoterol (DULERA) 200-5 MCG/ACT AERO Inhale 2 puffs into the lungs 2 (two) times daily.  Marland Kitchen omeprazole (PRILOSEC) 20 MG capsule Take 20 mg by mouth daily before breakfast.   . Omeprazole-Sodium Bicarbonate (ZEGERID OTC) 20-1100 MG CAPS capsule Take 1 capsule by mouth daily before breakfast.  . polyethylene glycol (MIRALAX / GLYCOLAX) packet Take 17 g by mouth 2 (two) times daily. (Patient taking differently: Take 17 g by mouth daily as needed (for constipation.). )  . pravastatin (PRAVACHOL) 20 MG tablet Take 20 mg by mouth daily before breakfast.   . SPIRIVA RESPIMAT 2.5 MCG/ACT AERS Inhale 2 puffs into the lungs daily after breakfast.  . traMADol (ULTRAM) 50 MG tablet 50 mg.  . [DISCONTINUED] Coenzyme Q10 (COQ10) 200 MG CAPS Take 200 mg by mouth every 14 (fourteen) days. Once every 2-3 weeks (when she remembers)  . [DISCONTINUED] methocarbamol (ROBAXIN) 500 MG tablet Take 1 tablet (500 mg total) by mouth every 6 (six) hours as needed for muscle spasms.  . [  DISCONTINUED] oxyCODONE (OXY IR/ROXICODONE) 5 MG immediate release tablet Take 1-2 tablets (5-10 mg total) by mouth every 4 (four) hours as needed for moderate pain.   No facility-administered encounter medications on file as of 09/10/2016.     Allergies as of 09/10/2016 - Review Complete 09/10/2016  Allergen Reaction Noted  . Aspirin  01/27/2011    Past Medical History:  Diagnosis Date  . Arthritis    "qwhere; hands,  feet, different body parts from time to time" (05/08/2016)  . Asthma   . Breast mass 07/03/2016  . Cancer [facility])    right breast  . Complication of anesthesia    "they used an adult tube going down my throat; caused alot of problems; was told to always have pediatric tube placed" (05/08/2016)  . COPD (chronic obstructive pulmonary disease) ([place])   . GERD (gastroesophageal reflux disease)   . History of hiatal hernia   . Hyperglycemia   . Hyperlipidemia   . Hypertension   . Hypothyroidism   . Lump of breast, right    "suppose to have it checked soon" (05/08/2016)  . Migraine    "none for years" (05/08/2016)  . Osteopenia   . Pneumonia "several times"  . Status post radioactive iodine thyroid ablation   . Thyroid disease     Past Surgical History:  Procedure Laterality Date  . 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 APPENDECTOMY  1963  . BREAST LUMPECTOMY WITH AXILLARY LYMPH NODE DISSECTION Right 08/14/2016   Procedure: RIGHT BREAST LUMPECTOMY WITH AXILLARY LYMPH NODE DISSECTION;  Surgeon: Stark Klein, MD;  Location: [facility];  Service: General;  Laterality: Right;  . DILATION AND CURETTAGE OF UTERUS  "several"   "when I was young"  . INGUINAL HERNIA REPAIR Bilateral 2000  . MASS EXCISION Right 08/14/2016   Procedure: EXCISION RIGHT ARM MASS 2X3CM;  Surgeon: Stark Klein, MD;  Location: [place];  Service: General;  Laterality: Right;  . POLYPECTOMY     "vocal cord"  . TONSILLECTOMY    . TOTAL HIP ARTHROPLASTY Left 05/09/2016   Procedure: TOTAL HIP ARTHROPLASTY ANTERIOR APPROACH;  Surgeon: Paralee Cancel, MD;  Location: [facility];  Service: Orthopedics;  Laterality: Left;  . TUBAL LIGATION    . VAGINAL HYSTERECTOMY  1979    Family History  Problem Relation Age of Onset  . Hypertension Father   . Hypertension Mother   . Stroke Mother   . Heart attack Mother   . Allergies Mother     Social History   Social History  . Marital status: Divorced    Spouse  name: N/A  . Number of children: 3  . Years of education: N/A   Occupational History  . retired    Social History Main Topics  . Smoking status: Former Smoker    Packs/day: 1.00    Years: 25.00    Types: Cigarettes    Quit date: 03/31/2006  . Smokeless tobacco: Never Used  . Alcohol use 0.0 oz/week     Comment: 05/08/2016 "I'll have a drink on special occasions; usually wine"  . Drug use: No  . Sexual activity: No   Other Topics Concern  . Not on file   Social History Narrative  . No narrative on file    Review of systems: Review of Systems  Constitutional: Negative for fever and chills.  HENT: Negative.   Eyes: Negative for blurred vision.  Respiratory: as per HPI  Cardiovascular: Negative for  chest pain and palpitations.  Gastrointestinal: Negative for vomiting, diarrhea, blood per rectum. Genitourinary: Negative for dysuria, urgency, frequency and hematuria.  Musculoskeletal: Negative for myalgias, back pain and joint pain.  Skin: Negative for itching and rash.  Neurological: Negative for dizziness, tremors, focal weakness, seizures and loss of consciousness.  Endo/Heme/Allergies: Negative for environmental allergies.  Psychiatric/Behavioral: Negative for depression, suicidal ideas and hallucinations.  All other systems reviewed and are negative.  Physical Exam: Blood pressure 130/80, pulse 88, height 5' 4.5" (1.638 m), weight 121 lb 3.2 oz (55 kg), SpO2 96 %. Gen:      No acute distress HEENT:  EOMI, sclera anicteric Neck:     No masses; no thyromegaly Lungs:    Clear to auscultation bilaterally; normal respiratory effort CV:         Regular rate and rhythm; no murmurs Abd:      + bowel sounds; soft, non-tender; no palpable masses, no distension Ext:    No edema; adequate peripheral perfusion Skin:      Warm and dry; no rash Neuro: alert and oriented x 3 Psych: normal mood and affect  Data Reviewed: PFTs  02/18/11 FVC 2.42 [117%) FEV1 1.46 (69%) F/F 46 TLC  112% DLCO 69% Moderate obstructive defect, mild reduction in diffusion capacity.  08/23/15 FVC 2.14 [71%] FEV1 0.94 [41%] F/F 44 TLC 114% DLCO 57% Severe obstructive defect with moderate reduction in diffusion capacity  CT chest 02/26/15- mild left lower lobe atelectasis, moderate emphysematous changes.  I have reviewed the images personally  Assessment:  Severe COPD. GOLD stage D She is currently using both Symbicort and Dulera and stopped using the spiriva. We reviewed her inhaler regimen and I told her she should not be taking Symbicort and Dulera at the same time. I asked her to stop the [facility] and resume the Spiriva. She'll continue on the albuterol rescue inhaler and dalirep. No desats on ambulation today.  We discussed the effects of XRT on the lung. I told her that there are risks of worsening lung function from radiation but I think the risk/benefits would favour proceeding with the radiation therapy.   Offered pulmonary rehabilitation but she is on already on an exercise program with a personal trainer. She is a candidate for low-dose screening CT given his smoking history but she is not interested in it now given the associated costs.  Plan/Recommendations: - Continue daliresp, Symbicort, Clearance Coots MD [facility] Pager [phone] 09/10/2016, 2:18 PM  CC: Ernestene Kiel, MD

## 2016-09-10 NOTE — Patient Instructions (Signed)
Continue using the Symbicort. Don't use the Dulera while you're on the Symbicort We will send in a prescription for albuterol rescue inhaler Continue the Spiriva was provided Return to clinic in 3 months.

## 2016-09-15 ENCOUNTER — Ambulatory Visit
Admission: RE | Admit: 2016-09-15 | Discharge: 2016-09-15 | Disposition: A | Discharge status: Home / Self Care | Payer: PPO | Source: Ambulatory Visit | Attending: Hematology and Oncology | Admitting: Hematology and Oncology

## 2016-09-15 DIAGNOSIS — E2839 Other primary ovarian failure: Secondary | ICD-10-CM

## 2016-09-15 DIAGNOSIS — M8589 Other specified disorders of bone density and structure, multiple sites: Secondary | ICD-10-CM | POA: Diagnosis not present

## 2016-09-19 ENCOUNTER — Encounter: Payer: Self-pay | Admitting: Hematology and Oncology

## 2016-09-26 ENCOUNTER — Ambulatory Visit
Admission: RE | Admit: 2016-09-26 | Discharge: 2016-09-26 | Disposition: A | Discharge status: Home / Self Care | Payer: PPO | Source: Ambulatory Visit | Attending: Radiation Oncology | Admitting: Radiation Oncology

## 2016-09-26 DIAGNOSIS — Z170 Estrogen receptor positive status [ER+]: Principal | ICD-10-CM

## 2016-09-26 DIAGNOSIS — Z510 Encounter for antineoplastic radiation therapy: Secondary | ICD-10-CM | POA: Diagnosis not present

## 2016-09-26 DIAGNOSIS — C50411 Malignant neoplasm of upper-outer quadrant of right female breast: Secondary | ICD-10-CM | POA: Diagnosis not present

## 2016-09-27 NOTE — Progress Notes (Signed)
  Radiation Oncology         [phone] ________________________________  Name: ALVERDA NAZZARO MRN: [id number]  Date: 09/26/2016  DOB: 1945/04/04  SIMULATION AND TREATMENT PLANNING NOTE    Outpatient  DIAGNOSIS:     ICD-10-CM   1. Carcinoma of upper-outer quadrant of right breast in female, estrogen receptor positive ([place]) C50.411    Z17.0     NARRATIVE:  The patient was brought to the [facility].  Identity was confirmed.  All relevant records and images related to the planned course of therapy were reviewed.  The patient freely provided informed written consent to proceed with treatment after reviewing the details related to the planned course of therapy. The consent form was witnessed and verified by the simulation staff.    Then, the patient was set-up in a stable reproducible supine position for radiation therapy with her ipsilateral arm over her head, and her upper body secured in a custom-made Vac-lok device.  CT images were obtained.  Surface markings were placed.  The CT images were loaded into the planning software.    TREATMENT PLANNING NOTE: Treatment planning then occurred.  The radiation prescription was entered and confirmed.     A total of 5 medically necessary complex treatment devices were fabricated and supervised by me: 4 fields with MLCs for custom blocks to protect heart, and lungs;  and, a Vac-lok. MORE COMPLEX DEVICES MAY BE MADE IN DOSIMETRY FOR FIELD IN FIELD BEAMS FOR DOSE HOMOGENEITY.  I have requested : 3D Simulation which is medically necessary to give adequate dose to at risk tissues while sparing lungs and heart.  I have requested a DVH of the following structures: lungs, heart, lumpectomy cavity, esophagus, spinal cord.    The patient will receive 50 Gy in 25 fractions to the right breast and 45-50 Gy to the right SCV / axillary nodes with a total of 4 fields.  This will be followed by a boost.  Optical Surface Tracking Plan:  Since  intensity modulated radiotherapy (IMRT) and 3D conformal radiation treatment methods are predicated on accurate and precise positioning for treatment, intrafraction motion monitoring is medically necessary to ensure accurate and safe treatment delivery. The ability to quantify intrafraction motion without excessive ionizing radiation dose can only be performed with optical surface tracking. Accordingly, surface imaging offers the opportunity to obtain 3D measurements of patient position throughout IMRT and 3D treatments without excessive radiation exposure. I am ordering optical surface tracking for this patient's upcoming course of radiotherapy.  ________________________________   Reference:  Ursula Alert, J, et al. Surface imaging-based analysis of intrafraction motion for breast radiotherapy patients.Journal of [place], n. 6, nov. 2014. ISSN [id number].  Available at: <http://www.jacmp.org/index.php/jacmp/article/view/4957>.    -----------------------------------  Eppie Gibson, MD

## 2016-09-28 DIAGNOSIS — G43909 Migraine, unspecified, not intractable, without status migrainosus: Secondary | ICD-10-CM | POA: Diagnosis not present

## 2016-09-28 DIAGNOSIS — Z823 Family history of stroke: Secondary | ICD-10-CM | POA: Diagnosis not present

## 2016-09-28 DIAGNOSIS — I10 Essential (primary) hypertension: Secondary | ICD-10-CM | POA: Diagnosis not present

## 2016-09-28 DIAGNOSIS — Z90710 Acquired absence of both cervix and uterus: Secondary | ICD-10-CM | POA: Diagnosis not present

## 2016-09-28 DIAGNOSIS — E039 Hypothyroidism, unspecified: Secondary | ICD-10-CM | POA: Diagnosis not present

## 2016-09-28 DIAGNOSIS — C773 Secondary and unspecified malignant neoplasm of axilla and upper limb lymph nodes: Secondary | ICD-10-CM | POA: Diagnosis not present

## 2016-09-28 DIAGNOSIS — Z510 Encounter for antineoplastic radiation therapy: Secondary | ICD-10-CM | POA: Diagnosis not present

## 2016-09-28 DIAGNOSIS — E785 Hyperlipidemia, unspecified: Secondary | ICD-10-CM | POA: Diagnosis not present

## 2016-09-28 DIAGNOSIS — Z79899 Other long term (current) drug therapy: Secondary | ICD-10-CM | POA: Diagnosis not present

## 2016-09-28 DIAGNOSIS — Z8249 Family history of ischemic heart disease and other diseases of the circulatory system: Secondary | ICD-10-CM | POA: Diagnosis not present

## 2016-09-28 DIAGNOSIS — Z886 Allergy status to analgesic agent status: Secondary | ICD-10-CM | POA: Diagnosis not present

## 2016-09-28 DIAGNOSIS — Z96642 Presence of left artificial hip joint: Secondary | ICD-10-CM | POA: Diagnosis not present

## 2016-09-28 DIAGNOSIS — Z79811 Long term (current) use of aromatase inhibitors: Secondary | ICD-10-CM | POA: Diagnosis not present

## 2016-09-28 DIAGNOSIS — M1990 Unspecified osteoarthritis, unspecified site: Secondary | ICD-10-CM | POA: Diagnosis not present

## 2016-09-28 DIAGNOSIS — J449 Chronic obstructive pulmonary disease, unspecified: Secondary | ICD-10-CM | POA: Diagnosis not present

## 2016-09-28 DIAGNOSIS — K219 Gastro-esophageal reflux disease without esophagitis: Secondary | ICD-10-CM | POA: Diagnosis not present

## 2016-09-28 DIAGNOSIS — C50411 Malignant neoplasm of upper-outer quadrant of right female breast: Secondary | ICD-10-CM | POA: Diagnosis not present

## 2016-09-28 DIAGNOSIS — Z170 Estrogen receptor positive status [ER+]: Secondary | ICD-10-CM | POA: Diagnosis not present

## 2016-09-28 DIAGNOSIS — Z7951 Long term (current) use of inhaled steroids: Secondary | ICD-10-CM | POA: Diagnosis not present

## 2016-09-28 DIAGNOSIS — Z87891 Personal history of nicotine dependence: Secondary | ICD-10-CM | POA: Diagnosis not present

## 2016-09-28 DIAGNOSIS — M8580 Other specified disorders of bone density and structure, unspecified site: Secondary | ICD-10-CM | POA: Diagnosis not present

## 2016-09-30 DIAGNOSIS — C50411 Malignant neoplasm of upper-outer quadrant of right female breast: Secondary | ICD-10-CM | POA: Diagnosis not present

## 2016-09-30 DIAGNOSIS — Z510 Encounter for antineoplastic radiation therapy: Secondary | ICD-10-CM | POA: Diagnosis not present

## 2016-10-06 ENCOUNTER — Ambulatory Visit
Admission: RE | Admit: 2016-10-06 | Discharge: 2016-10-06 | Disposition: A | Discharge status: Home / Self Care | Payer: PPO | Source: Ambulatory Visit | Attending: Radiation Oncology | Admitting: Radiation Oncology

## 2016-10-06 DIAGNOSIS — C773 Secondary and unspecified malignant neoplasm of axilla and upper limb lymph nodes: Principal | ICD-10-CM

## 2016-10-06 DIAGNOSIS — C50911 Malignant neoplasm of unspecified site of right female breast: Secondary | ICD-10-CM

## 2016-10-06 DIAGNOSIS — Z510 Encounter for antineoplastic radiation therapy: Secondary | ICD-10-CM | POA: Diagnosis not present

## 2016-10-06 DIAGNOSIS — C50411 Malignant neoplasm of upper-outer quadrant of right female breast: Secondary | ICD-10-CM | POA: Diagnosis not present

## 2016-10-06 MED ORDER — ALRA NON-METALLIC DEODORANT (RAD-ONC)
1.0000 "application " | Freq: Once | TOPICAL | Status: AC
  Administered 2016-10-06: 1 via TOPICAL

## 2016-10-06 MED ORDER — RADIAPLEXRX EX GEL
Freq: Once | CUTANEOUS | Status: AC
  Administered 2016-10-06: 16:00:00 via TOPICAL

## 2016-10-06 NOTE — Progress Notes (Signed)
Pt here for patient teaching.  Pt given Radiation and You booklet, Alra deodorant and Radiaplex gel.  Reviewed areas of pertinence such as fatigue, hair loss, skin changes, breast tenderness and breast swelling . Pt able to give teach back of to pat skin and use unscented/gentle soap,apply Radiaplex bid, avoid applying anything to skin within 4 hours of treatment, avoid wearing an under wire bra and to use an electric razor if they must shave. Pt demonstrated understanding and verbalizes understanding of information given and will contact nursing with any questions or concerns.

## 2016-10-07 ENCOUNTER — Ambulatory Visit
Admission: RE | Admit: 2016-10-07 | Discharge: 2016-10-07 | Disposition: A | Discharge status: Home / Self Care | Payer: PPO | Source: Ambulatory Visit | Attending: Radiation Oncology | Admitting: Radiation Oncology

## 2016-10-07 DIAGNOSIS — Z510 Encounter for antineoplastic radiation therapy: Secondary | ICD-10-CM | POA: Diagnosis not present

## 2016-10-07 DIAGNOSIS — C50411 Malignant neoplasm of upper-outer quadrant of right female breast: Secondary | ICD-10-CM | POA: Diagnosis not present

## 2016-10-08 ENCOUNTER — Ambulatory Visit
Admission: RE | Admit: 2016-10-08 | Discharge: 2016-10-08 | Disposition: A | Discharge status: Home / Self Care | Payer: PPO | Source: Ambulatory Visit | Attending: Radiation Oncology | Admitting: Radiation Oncology

## 2016-10-08 DIAGNOSIS — Z510 Encounter for antineoplastic radiation therapy: Secondary | ICD-10-CM | POA: Diagnosis not present

## 2016-10-08 DIAGNOSIS — C50411 Malignant neoplasm of upper-outer quadrant of right female breast: Secondary | ICD-10-CM | POA: Diagnosis not present

## 2016-10-09 ENCOUNTER — Ambulatory Visit
Admission: RE | Admit: 2016-10-09 | Discharge: 2016-10-09 | Disposition: A | Discharge status: Home / Self Care | Payer: PPO | Source: Ambulatory Visit | Attending: Radiation Oncology | Admitting: Radiation Oncology

## 2016-10-09 DIAGNOSIS — Z510 Encounter for antineoplastic radiation therapy: Secondary | ICD-10-CM | POA: Diagnosis not present

## 2016-10-09 DIAGNOSIS — C50411 Malignant neoplasm of upper-outer quadrant of right female breast: Secondary | ICD-10-CM | POA: Diagnosis not present

## 2016-10-10 ENCOUNTER — Ambulatory Visit
Admission: RE | Admit: 2016-10-10 | Discharge: 2016-10-10 | Disposition: A | Discharge status: Home / Self Care | Payer: PPO | Source: Ambulatory Visit | Attending: Radiation Oncology | Admitting: Radiation Oncology

## 2016-10-10 DIAGNOSIS — Z510 Encounter for antineoplastic radiation therapy: Secondary | ICD-10-CM | POA: Diagnosis not present

## 2016-10-10 DIAGNOSIS — C50411 Malignant neoplasm of upper-outer quadrant of right female breast: Secondary | ICD-10-CM | POA: Diagnosis not present

## 2016-10-13 ENCOUNTER — Ambulatory Visit
Admission: RE | Admit: 2016-10-13 | Discharge: 2016-10-13 | Disposition: A | Discharge status: Home / Self Care | Payer: PPO | Source: Ambulatory Visit | Attending: Radiation Oncology | Admitting: Radiation Oncology

## 2016-10-13 DIAGNOSIS — Z510 Encounter for antineoplastic radiation therapy: Secondary | ICD-10-CM | POA: Diagnosis not present

## 2016-10-13 DIAGNOSIS — C50411 Malignant neoplasm of upper-outer quadrant of right female breast: Secondary | ICD-10-CM | POA: Diagnosis not present

## 2016-10-14 ENCOUNTER — Ambulatory Visit
Admission: RE | Admit: 2016-10-14 | Discharge: 2016-10-14 | Disposition: A | Discharge status: Home / Self Care | Payer: PPO | Source: Ambulatory Visit | Attending: Radiation Oncology | Admitting: Radiation Oncology

## 2016-10-14 DIAGNOSIS — C50411 Malignant neoplasm of upper-outer quadrant of right female breast: Secondary | ICD-10-CM | POA: Diagnosis not present

## 2016-10-14 DIAGNOSIS — Z510 Encounter for antineoplastic radiation therapy: Secondary | ICD-10-CM | POA: Diagnosis not present

## 2016-10-15 ENCOUNTER — Ambulatory Visit
Admission: RE | Admit: 2016-10-15 | Discharge: 2016-10-15 | Disposition: A | Discharge status: Home / Self Care | Payer: PPO | Source: Ambulatory Visit | Attending: Radiation Oncology | Admitting: Radiation Oncology

## 2016-10-15 DIAGNOSIS — C50411 Malignant neoplasm of upper-outer quadrant of right female breast: Secondary | ICD-10-CM | POA: Diagnosis not present

## 2016-10-15 DIAGNOSIS — Z510 Encounter for antineoplastic radiation therapy: Secondary | ICD-10-CM | POA: Diagnosis not present

## 2016-10-15 NOTE — Progress Notes (Signed)
Nutrition Assessment   Reason for Assessment:   Patient with questions regarding nutrition, referred by RN Varney Biles  ASSESSMENT:  71 year old female with right breast cancer treated with lumpectomy.  Patient has declined chemotherapy and currently undergoing radiation therapy.  Past medical history of COPD, left hip fracture in Feb 2018  Spoke with patient via phone.  Patient reports that her son has hired her a Industrial/product designer to help with meals and eating well.  Reports that she eats 6 small meals per day.  Typical breakfast is yogurt, fruit, snacks on nuts, sometimes skips lunch or drinks boost sensation (180 calories, 15 g of protein). Patient reports usually dinner is fish and vegetables. Patient reports appetite comes and goes, "some days I don't feel like eating."     Nutrition Focused Physical Exam: deferred  Medications: reviewed  Labs: reviewed  Anthropometrics:   Height: 64 inches Weight: 121 lb 3.2 oz UBW: 132 lb in Feb when broke hip.  Does not want to get back to that weight, wants to remain around 120s BMI: 20  8% weight loss in 5 months  Patient reports that she feels she has lost a lot of muscle mass over the last few months as before was going to gym and working out but recently unable to do that with diagnosis of breast cancer.    Estimated Energy Needs  Kcals: 3532-9924 calories/d Protein: 66-83 g/d Fluid: 1.6 L/d  NUTRITION DIAGNOSIS: Inadequate oral intake related to cancer as evidenced by 8% weight loss in 5 months   INTERVENTION:   Discussed ways to increased calories and protein via phone.  Patient interested in receiving fact sheet via mail.   Encouraged patient to try higher calorie oral nutrition supplements. Also discussed ways to add calories to shakes (ie adding ice cream, yogurt, etc). Will mail coupons Patient declined nutrition appointment at this time.  Contact information mailed to patient    MONITORING, EVALUATION, GOAL:  Patient will consume adequate calories and protein to meet nutritional needs   NEXT VISIT: as needed, patient to contact me  Alezander Dimaano B. Zenia Resides, [place], [place] Registered Dietitian [phone] (pager)

## 2016-10-16 ENCOUNTER — Ambulatory Visit
Admission: RE | Admit: 2016-10-16 | Discharge: 2016-10-16 | Disposition: A | Discharge status: Home / Self Care | Payer: PPO | Source: Ambulatory Visit | Attending: Radiation Oncology | Admitting: Radiation Oncology

## 2016-10-16 DIAGNOSIS — C50411 Malignant neoplasm of upper-outer quadrant of right female breast: Secondary | ICD-10-CM | POA: Diagnosis not present

## 2016-10-16 DIAGNOSIS — Z510 Encounter for antineoplastic radiation therapy: Secondary | ICD-10-CM | POA: Diagnosis not present

## 2016-10-17 ENCOUNTER — Ambulatory Visit
Admission: RE | Admit: 2016-10-17 | Discharge: 2016-10-17 | Disposition: A | Discharge status: Home / Self Care | Payer: PPO | Source: Ambulatory Visit | Attending: Radiation Oncology | Admitting: Radiation Oncology

## 2016-10-17 DIAGNOSIS — Z510 Encounter for antineoplastic radiation therapy: Secondary | ICD-10-CM | POA: Diagnosis not present

## 2016-10-17 DIAGNOSIS — C50411 Malignant neoplasm of upper-outer quadrant of right female breast: Secondary | ICD-10-CM | POA: Diagnosis not present

## 2016-10-20 ENCOUNTER — Ambulatory Visit
Admission: RE | Admit: 2016-10-20 | Discharge: 2016-10-20 | Disposition: A | Discharge status: Home / Self Care | Payer: PPO | Source: Ambulatory Visit | Attending: Radiation Oncology | Admitting: Radiation Oncology

## 2016-10-20 DIAGNOSIS — C50411 Malignant neoplasm of upper-outer quadrant of right female breast: Secondary | ICD-10-CM | POA: Diagnosis not present

## 2016-10-20 DIAGNOSIS — Z510 Encounter for antineoplastic radiation therapy: Secondary | ICD-10-CM | POA: Diagnosis not present

## 2016-10-21 ENCOUNTER — Ambulatory Visit
Admission: RE | Admit: 2016-10-21 | Discharge: 2016-10-21 | Disposition: A | Discharge status: Home / Self Care | Payer: PPO | Source: Ambulatory Visit | Attending: Radiation Oncology | Admitting: Radiation Oncology

## 2016-10-21 DIAGNOSIS — C50411 Malignant neoplasm of upper-outer quadrant of right female breast: Secondary | ICD-10-CM | POA: Diagnosis not present

## 2016-10-21 DIAGNOSIS — Z510 Encounter for antineoplastic radiation therapy: Secondary | ICD-10-CM | POA: Diagnosis not present

## 2016-10-22 ENCOUNTER — Ambulatory Visit
Admission: RE | Admit: 2016-10-22 | Discharge: 2016-10-22 | Disposition: A | Discharge status: Home / Self Care | Payer: PPO | Source: Ambulatory Visit | Attending: Radiation Oncology | Admitting: Radiation Oncology

## 2016-10-22 DIAGNOSIS — Z510 Encounter for antineoplastic radiation therapy: Secondary | ICD-10-CM | POA: Diagnosis not present

## 2016-10-22 DIAGNOSIS — C50411 Malignant neoplasm of upper-outer quadrant of right female breast: Secondary | ICD-10-CM | POA: Diagnosis not present

## 2016-10-23 ENCOUNTER — Ambulatory Visit
Admission: RE | Admit: 2016-10-23 | Discharge: 2016-10-23 | Disposition: A | Discharge status: Home / Self Care | Payer: PPO | Source: Ambulatory Visit | Attending: Radiation Oncology | Admitting: Radiation Oncology

## 2016-10-23 DIAGNOSIS — Z510 Encounter for antineoplastic radiation therapy: Secondary | ICD-10-CM | POA: Diagnosis not present

## 2016-10-23 DIAGNOSIS — C50411 Malignant neoplasm of upper-outer quadrant of right female breast: Secondary | ICD-10-CM | POA: Diagnosis not present

## 2016-10-24 ENCOUNTER — Ambulatory Visit
Admission: RE | Admit: 2016-10-24 | Discharge: 2016-10-24 | Disposition: A | Discharge status: Home / Self Care | Payer: PPO | Source: Ambulatory Visit | Attending: Radiation Oncology | Admitting: Radiation Oncology

## 2016-10-24 DIAGNOSIS — C50411 Malignant neoplasm of upper-outer quadrant of right female breast: Secondary | ICD-10-CM | POA: Diagnosis not present

## 2016-10-24 DIAGNOSIS — Z510 Encounter for antineoplastic radiation therapy: Secondary | ICD-10-CM | POA: Diagnosis not present

## 2016-10-27 ENCOUNTER — Ambulatory Visit
Admission: RE | Admit: 2016-10-27 | Discharge: 2016-10-27 | Disposition: A | Discharge status: Home / Self Care | Payer: PPO | Source: Ambulatory Visit | Attending: Radiation Oncology | Admitting: Radiation Oncology

## 2016-10-27 ENCOUNTER — Ambulatory Visit: Payer: PPO | Admitting: Pulmonary Disease

## 2016-10-27 DIAGNOSIS — Z510 Encounter for antineoplastic radiation therapy: Secondary | ICD-10-CM | POA: Diagnosis not present

## 2016-10-27 DIAGNOSIS — C50411 Malignant neoplasm of upper-outer quadrant of right female breast: Secondary | ICD-10-CM | POA: Diagnosis not present

## 2016-10-27 DIAGNOSIS — C773 Secondary and unspecified malignant neoplasm of axilla and upper limb lymph nodes: Principal | ICD-10-CM

## 2016-10-27 DIAGNOSIS — C50911 Malignant neoplasm of unspecified site of right female breast: Secondary | ICD-10-CM

## 2016-10-27 MED ORDER — RADIAPLEXRX EX GEL
Freq: Once | CUTANEOUS | Status: AC
  Administered 2016-10-27: 14:00:00 via TOPICAL

## 2016-10-28 ENCOUNTER — Ambulatory Visit
Admission: RE | Admit: 2016-10-28 | Discharge: 2016-10-28 | Disposition: A | Discharge status: Home / Self Care | Payer: PPO | Source: Ambulatory Visit | Attending: Radiation Oncology | Admitting: Radiation Oncology

## 2016-10-28 DIAGNOSIS — Z510 Encounter for antineoplastic radiation therapy: Secondary | ICD-10-CM | POA: Diagnosis not present

## 2016-10-28 DIAGNOSIS — C50411 Malignant neoplasm of upper-outer quadrant of right female breast: Secondary | ICD-10-CM | POA: Diagnosis not present

## 2016-10-29 ENCOUNTER — Ambulatory Visit
Admission: RE | Admit: 2016-10-29 | Discharge: 2016-10-29 | Disposition: A | Discharge status: Home / Self Care | Payer: PPO | Source: Ambulatory Visit | Attending: Radiation Oncology | Admitting: Radiation Oncology

## 2016-10-29 DIAGNOSIS — Z510 Encounter for antineoplastic radiation therapy: Secondary | ICD-10-CM | POA: Diagnosis not present

## 2016-10-29 DIAGNOSIS — C50411 Malignant neoplasm of upper-outer quadrant of right female breast: Secondary | ICD-10-CM | POA: Diagnosis not present

## 2016-10-30 ENCOUNTER — Ambulatory Visit
Admission: RE | Admit: 2016-10-30 | Discharge: 2016-10-30 | Disposition: A | Discharge status: Home / Self Care | Payer: PPO | Source: Ambulatory Visit | Attending: Radiation Oncology | Admitting: Radiation Oncology

## 2016-10-30 DIAGNOSIS — Z510 Encounter for antineoplastic radiation therapy: Secondary | ICD-10-CM | POA: Diagnosis not present

## 2016-10-30 DIAGNOSIS — C50411 Malignant neoplasm of upper-outer quadrant of right female breast: Secondary | ICD-10-CM | POA: Diagnosis not present

## 2016-10-31 ENCOUNTER — Ambulatory Visit
Admission: RE | Admit: 2016-10-31 | Discharge: 2016-10-31 | Disposition: A | Discharge status: Home / Self Care | Payer: PPO | Source: Ambulatory Visit | Attending: Radiation Oncology | Admitting: Radiation Oncology

## 2016-10-31 DIAGNOSIS — Z510 Encounter for antineoplastic radiation therapy: Secondary | ICD-10-CM | POA: Diagnosis not present

## 2016-10-31 DIAGNOSIS — C50411 Malignant neoplasm of upper-outer quadrant of right female breast: Secondary | ICD-10-CM | POA: Diagnosis not present

## 2016-11-03 ENCOUNTER — Ambulatory Visit
Admission: RE | Admit: 2016-11-03 | Discharge: 2016-11-03 | Disposition: A | Discharge status: Home / Self Care | Payer: PPO | Source: Ambulatory Visit | Attending: Radiation Oncology | Admitting: Radiation Oncology

## 2016-11-03 ENCOUNTER — Ambulatory Visit: Payer: PPO | Admitting: Radiation Oncology

## 2016-11-03 DIAGNOSIS — Z510 Encounter for antineoplastic radiation therapy: Secondary | ICD-10-CM | POA: Diagnosis not present

## 2016-11-03 DIAGNOSIS — C50411 Malignant neoplasm of upper-outer quadrant of right female breast: Secondary | ICD-10-CM | POA: Diagnosis not present

## 2016-11-04 ENCOUNTER — Ambulatory Visit
Admission: RE | Admit: 2016-11-04 | Discharge: 2016-11-04 | Disposition: A | Discharge status: Home / Self Care | Payer: PPO | Source: Ambulatory Visit | Attending: Radiation Oncology | Admitting: Radiation Oncology

## 2016-11-04 DIAGNOSIS — Z510 Encounter for antineoplastic radiation therapy: Secondary | ICD-10-CM | POA: Diagnosis not present

## 2016-11-04 DIAGNOSIS — C50411 Malignant neoplasm of upper-outer quadrant of right female breast: Secondary | ICD-10-CM | POA: Diagnosis not present

## 2016-11-05 ENCOUNTER — Ambulatory Visit
Admission: RE | Admit: 2016-11-05 | Discharge: 2016-11-05 | Disposition: A | Discharge status: Home / Self Care | Payer: PPO | Source: Ambulatory Visit | Attending: Radiation Oncology | Admitting: Radiation Oncology

## 2016-11-05 DIAGNOSIS — Z510 Encounter for antineoplastic radiation therapy: Secondary | ICD-10-CM | POA: Diagnosis not present

## 2016-11-05 DIAGNOSIS — C50411 Malignant neoplasm of upper-outer quadrant of right female breast: Secondary | ICD-10-CM | POA: Diagnosis not present

## 2016-11-06 ENCOUNTER — Ambulatory Visit
Admission: RE | Admit: 2016-11-06 | Discharge: 2016-11-06 | Disposition: A | Discharge status: Home / Self Care | Payer: PPO | Source: Ambulatory Visit | Attending: Radiation Oncology | Admitting: Radiation Oncology

## 2016-11-06 DIAGNOSIS — Z510 Encounter for antineoplastic radiation therapy: Secondary | ICD-10-CM | POA: Diagnosis not present

## 2016-11-06 DIAGNOSIS — C50411 Malignant neoplasm of upper-outer quadrant of right female breast: Secondary | ICD-10-CM | POA: Diagnosis not present

## 2016-11-07 ENCOUNTER — Ambulatory Visit
Admission: RE | Admit: 2016-11-07 | Discharge: 2016-11-07 | Disposition: A | Discharge status: Home / Self Care | Payer: PPO | Source: Ambulatory Visit | Attending: Radiation Oncology | Admitting: Radiation Oncology

## 2016-11-07 ENCOUNTER — Ambulatory Visit: Payer: PPO | Admitting: Radiation Oncology

## 2016-11-07 DIAGNOSIS — C50411 Malignant neoplasm of upper-outer quadrant of right female breast: Secondary | ICD-10-CM

## 2016-11-07 DIAGNOSIS — Z170 Estrogen receptor positive status [ER+]: Principal | ICD-10-CM

## 2016-11-07 DIAGNOSIS — Z510 Encounter for antineoplastic radiation therapy: Secondary | ICD-10-CM | POA: Diagnosis not present

## 2016-11-07 MED ORDER — RADIAPLEXRX EX GEL
Freq: Once | CUTANEOUS | Status: AC
  Administered 2016-11-07: 15:00:00 via TOPICAL

## 2016-11-10 ENCOUNTER — Ambulatory Visit
Admission: RE | Admit: 2016-11-10 | Discharge: 2016-11-10 | Disposition: A | Discharge status: Home / Self Care | Payer: PPO | Source: Ambulatory Visit | Attending: Radiation Oncology | Admitting: Radiation Oncology

## 2016-11-10 DIAGNOSIS — Z510 Encounter for antineoplastic radiation therapy: Secondary | ICD-10-CM | POA: Insufficient documentation

## 2016-11-10 DIAGNOSIS — C50411 Malignant neoplasm of upper-outer quadrant of right female breast: Secondary | ICD-10-CM | POA: Diagnosis not present

## 2016-11-10 DIAGNOSIS — Z886 Allergy status to analgesic agent status: Secondary | ICD-10-CM | POA: Diagnosis not present

## 2016-11-10 DIAGNOSIS — Z79811 Long term (current) use of aromatase inhibitors: Secondary | ICD-10-CM | POA: Diagnosis not present

## 2016-11-10 DIAGNOSIS — Z79899 Other long term (current) drug therapy: Secondary | ICD-10-CM | POA: Insufficient documentation

## 2016-11-10 DIAGNOSIS — Z7951 Long term (current) use of inhaled steroids: Secondary | ICD-10-CM | POA: Insufficient documentation

## 2016-11-10 DIAGNOSIS — Z170 Estrogen receptor positive status [ER+]: Secondary | ICD-10-CM | POA: Diagnosis not present

## 2016-11-11 ENCOUNTER — Ambulatory Visit
Admission: RE | Admit: 2016-11-11 | Discharge: 2016-11-11 | Disposition: A | Discharge status: Home / Self Care | Payer: PPO | Source: Ambulatory Visit | Attending: Radiation Oncology | Admitting: Radiation Oncology

## 2016-11-11 DIAGNOSIS — C50411 Malignant neoplasm of upper-outer quadrant of right female breast: Secondary | ICD-10-CM | POA: Diagnosis not present

## 2016-11-11 DIAGNOSIS — Z510 Encounter for antineoplastic radiation therapy: Secondary | ICD-10-CM | POA: Diagnosis not present

## 2016-11-12 ENCOUNTER — Ambulatory Visit
Admission: RE | Admit: 2016-11-12 | Discharge: 2016-11-12 | Disposition: A | Discharge status: Home / Self Care | Payer: PPO | Source: Ambulatory Visit | Attending: Radiation Oncology | Admitting: Radiation Oncology

## 2016-11-12 DIAGNOSIS — Z510 Encounter for antineoplastic radiation therapy: Secondary | ICD-10-CM | POA: Diagnosis not present

## 2016-11-12 DIAGNOSIS — C50411 Malignant neoplasm of upper-outer quadrant of right female breast: Secondary | ICD-10-CM | POA: Diagnosis not present

## 2016-11-13 ENCOUNTER — Ambulatory Visit
Admission: RE | Admit: 2016-11-13 | Discharge: 2016-11-13 | Disposition: A | Discharge status: Home / Self Care | Payer: PPO | Source: Ambulatory Visit | Attending: Radiation Oncology | Admitting: Radiation Oncology

## 2016-11-13 DIAGNOSIS — Z510 Encounter for antineoplastic radiation therapy: Secondary | ICD-10-CM | POA: Diagnosis not present

## 2016-11-13 DIAGNOSIS — C50411 Malignant neoplasm of upper-outer quadrant of right female breast: Secondary | ICD-10-CM | POA: Diagnosis not present

## 2016-11-14 ENCOUNTER — Ambulatory Visit
Admission: RE | Admit: 2016-11-14 | Discharge: 2016-11-14 | Disposition: A | Discharge status: Home / Self Care | Payer: PPO | Source: Ambulatory Visit | Attending: Radiation Oncology | Admitting: Radiation Oncology

## 2016-11-14 DIAGNOSIS — C50411 Malignant neoplasm of upper-outer quadrant of right female breast: Secondary | ICD-10-CM | POA: Diagnosis not present

## 2016-11-14 DIAGNOSIS — Z510 Encounter for antineoplastic radiation therapy: Secondary | ICD-10-CM | POA: Diagnosis not present

## 2016-11-17 ENCOUNTER — Ambulatory Visit
Admission: RE | Admit: 2016-11-17 | Discharge: 2016-11-17 | Disposition: A | Discharge status: Home / Self Care | Payer: PPO | Source: Ambulatory Visit | Attending: Radiation Oncology | Admitting: Radiation Oncology

## 2016-11-17 DIAGNOSIS — Z510 Encounter for antineoplastic radiation therapy: Secondary | ICD-10-CM | POA: Diagnosis not present

## 2016-11-17 DIAGNOSIS — C50411 Malignant neoplasm of upper-outer quadrant of right female breast: Secondary | ICD-10-CM | POA: Diagnosis not present

## 2016-11-18 ENCOUNTER — Encounter: Payer: Self-pay | Admitting: Radiation Oncology

## 2016-11-18 NOTE — Progress Notes (Signed)
  Radiation Oncology         ([phone]) [phone] ________________________________  Name: Linda [name] MRN: [id number]  Date: 11/18/2016  DOB: 09-07-1945  End of Treatment Note  Diagnosis:   Stage IA right breast cancer (pT1c, pN1a, cM0, G2, ER: Positive, PR: Positive, HER2: Negative)      Indication for treatment:  Curative       Radiation treatment dates:  10/07/16-11/17/16  Site/dose:   1.) Right Breast and regional nodes-50 Gy in 25 fractions to breast and at least 45 Gy in 25 fractions to nodes            2.) Right Breast Boost- 10 Gy in 5 fractions   Beams/energy:  1.)  3D, 6X/15X        2.) 3D, 10X/6X  Narrative: The patient tolerated radiation treatment relatively well.  She initially had no complaints but as her treatment progressed she began to develop radiation related skin changes to the right clavicular region, right breast, and right axillary area. She was also notes to have some inframammary peeling on exam to this area as well. She was using radiaplex, benadryl, and aloe vera gels at home to remedy this. She had also noted some fatigue and difficulty sleeping at night.   Plan: The patient has completed radiation treatment. The patient will return to radiation oncology clinic for routine followup in one month. I advised them to call or return sooner if they have any questions or concerns related to their recovery or treatment.  -----------------------------------  Eppie Gibson, MD  This document serves as a record of services personally performed by Eppie Gibson, MD. It was created on his behalf by Reola Mosher, a trained medical scribe. The creation of this record is based on the scribe's personal observations and the provider's statements to them. This document has been checked and approved by the attending provider.

## 2016-11-28 DIAGNOSIS — Z170 Estrogen receptor positive status [ER+]: Secondary | ICD-10-CM | POA: Diagnosis not present

## 2016-11-28 DIAGNOSIS — C50411 Malignant neoplasm of upper-outer quadrant of right female breast: Secondary | ICD-10-CM | POA: Diagnosis not present

## 2016-11-28 DIAGNOSIS — I890 Lymphedema, not elsewhere classified: Secondary | ICD-10-CM | POA: Diagnosis not present

## 2016-12-02 ENCOUNTER — Ambulatory Visit (HOSPITAL_BASED_OUTPATIENT_CLINIC_OR_DEPARTMENT_OTHER): Payer: PPO | Admitting: Hematology and Oncology

## 2016-12-02 ENCOUNTER — Telehealth: Payer: Self-pay | Admitting: Hematology and Oncology

## 2016-12-02 DIAGNOSIS — Z79811 Long term (current) use of aromatase inhibitors: Secondary | ICD-10-CM

## 2016-12-02 DIAGNOSIS — Z170 Estrogen receptor positive status [ER+]: Secondary | ICD-10-CM | POA: Diagnosis not present

## 2016-12-02 DIAGNOSIS — C50411 Malignant neoplasm of upper-outer quadrant of right female breast: Secondary | ICD-10-CM | POA: Diagnosis not present

## 2016-12-02 NOTE — Progress Notes (Signed)
Patient Care Team: Ernestene Kiel, MD as PCP - General (Internal Medicine) Collene Gobble, MD (Pulmonary Disease) Stark Klein, MD as Consulting Physician (Surgical Oncology) Eppie Gibson, MD as Attending Physician (Radiation Oncology) Nicholas Lose, MD as Consulting Physician (Hematology and Oncology)  DIAGNOSIS:  Encounter Diagnosis  Name Primary?  . Carcinoma of upper-outer quadrant of right breast in female, estrogen receptor positive ([place])     SUMMARY OF ONCOLOGIC HISTORY:   Carcinoma of upper-outer quadrant of right breast in female, estrogen receptor positive ([place])   07/03/2016 Initial Diagnosis    Right breast biopsy 10:30 position: IDC, lymphovascular invasion present, right axillary lymph node positive, grade 3, ER 90%, PR 60%, Ki-67 12%, HER-2 negative ratio 0.41; right breast mass 9 cm from nipple and 30 position: 1.7 x 1 x 1.6 cm, 2 abnormal lymph nodes right axilla largest 1.9 cm smaller 1.2 cm; T1c N1 stage II a      08/14/2016 Surgery    Rt Lumpectomy: IDC grade 2, 1.8 cm, 2/15 LN, ER 90%, PR 60%, Ki-67 12%, HER-2 negative ratio 0.41; T1CN1 (Stage 2A)      08/18/2016 Miscellaneous    Mammaprint high risk luminal B: Patient refused chemotherapy. 5 year risk of recurrence 20%, 10 year risk of recurrence 29%       CHIEF COMPLIANT: Follow-up to discuss side effects to anastrozole  INTERVAL HISTORY: ROSILAND SEN is a 71 year old with above-mentioned history of right breast cancer were high risk Mammaprint score and decided to not undergo chemotherapy or radiation. She is currently on antiestrogen therapy with anastrozole and appears to be tolerating it extremely well. Denies any hot flashes or myalgias. Denies any pain lumps or nodules in the breast.  REVIEW OF SYSTEMS:   Constitutional: Denies fevers, chills or abnormal weight loss Eyes: Denies blurriness of vision Ears, nose, mouth, throat, and face: Denies mucositis or sore throat Respiratory: Denies  cough, dyspnea or wheezes Cardiovascular: Denies palpitation, chest discomfort Gastrointestinal:  Denies nausea, heartburn or change in bowel habits Skin: Denies abnormal skin rashes Lymphatics: Denies new lymphadenopathy or easy bruising Neurological:Denies numbness, tingling or new weaknesses Behavioral/Psych: Mood is stable, no new changes  Extremities: No lower extremity edema Breast:  denies any pain or lumps or nodules in either breasts All other systems were reviewed with the patient and are negative.  I have reviewed the past medical history, past surgical history, social history and family history with the patient and they are unchanged from previous note.  ALLERGIES:  is allergic to aspirin.  MEDICATIONS:  Current Outpatient Prescriptions  Medication Sig Dispense Refill  . albuterol (PROVENTIL HFA;VENTOLIN HFA) 108 (90 Base) MCG/ACT inhaler Inhale 1-2 puffs into the lungs every 6 (six) hours as needed for wheezing or shortness of breath. 1 Inhaler 2  . amLODipine (NORVASC) 5 MG tablet Take 5 mg by mouth daily before breakfast.     . anastrozole (ARIMIDEX) 1 MG tablet Take 1 tablet (1 mg total) by mouth daily. 90 tablet 3  . budesonide-formoterol (SYMBICORT) 160-4.5 MCG/ACT inhaler Inhale 2 puffs into the lungs 2 (two) times daily.    . Cholecalciferol (VITAMIN D3) 2000 units capsule Take 2,000 Units by mouth every 14 (fourteen) days. Once every 2-3 weeks (when she remembers)    . DALIRESP 500 MCG TABS tablet TAKE ONE TABLET BY MOUTH DAILY (Patient taking differently: TAKE ONE TABLET BY MOUTH DAILY AFTER BREAKFAST) 30 tablet 0  . DALIRESP 500 MCG TABS tablet TAKE ONE TABLET BY MOUTH ONCE DAILY 30  tablet 5  . levothyroxine (SYNTHROID, LEVOTHROID) 88 MCG tablet Take 88 mcg by mouth daily before breakfast.     . losartan (COZAAR) 100 MG tablet Take 100 mg by mouth daily before breakfast.     . meloxicam (MOBIC) 15 MG tablet Take 15 mg by mouth daily before breakfast.     .  omeprazole (PRILOSEC) 20 MG capsule Take 20 mg by mouth daily before breakfast.     . Omeprazole-Sodium Bicarbonate (ZEGERID OTC) 20-1100 MG CAPS capsule Take 1 capsule by mouth daily before breakfast.    . polyethylene glycol (MIRALAX / GLYCOLAX) packet Take 17 g by mouth 2 (two) times daily. (Patient taking differently: Take 17 g by mouth daily as needed (for constipation.). ) 14 each 0  . pravastatin (PRAVACHOL) 20 MG tablet Take 20 mg by mouth daily before breakfast.     . SPIRIVA RESPIMAT 2.5 MCG/ACT AERS Inhale 2 puffs into the lungs daily after breakfast. 1 Inhaler 5  . traMADol (ULTRAM) 50 MG tablet 50 mg.     No current facility-administered medications for this visit.     PHYSICAL EXAMINATION: ECOG PERFORMANCE STATUS: 1 - Symptomatic but completely ambulatory  Vitals:   12/02/16 1402  BP: (!) 165/80  Pulse: 83  Resp: 18  Temp: 98.1 F (36.7 C)  SpO2: 97%   Filed Weights   12/02/16 1402  Weight: 120 lb (54.4 kg)    GENERAL:alert, no distress and comfortable SKIN: skin color, texture, turgor are normal, no rashes or significant lesions EYES: normal, Conjunctiva are pink and non-injected, sclera clear OROPHARYNX:no exudate, no erythema and lips, buccal mucosa, and tongue normal  NECK: supple, thyroid normal size, non-tender, without nodularity LYMPH:  no palpable lymphadenopathy in the cervical, axillary or inguinal LUNGS: clear to auscultation and percussion with normal breathing effort HEART: regular rate & rhythm and no murmurs and no lower extremity edema ABDOMEN:abdomen soft, non-tender and normal bowel sounds MUSCULOSKELETAL:no cyanosis of digits and no clubbing  NEURO: alert & oriented x 3 with fluent speech, no focal motor/sensory deficits EXTREMITIES: No lower extremity edema  LABORATORY DATA:  I have reviewed the data as listed   Chemistry      Component Value Date/Time   NA 135 08/15/2016 0605   K 4.3 08/15/2016 0605   CL 102 08/15/2016 0605   CO2 27  08/15/2016 0605   BUN 15 08/15/2016 0605   CREATININE 1.11 (H) 08/15/2016 0605      Component Value Date/Time   CALCIUM 8.9 08/15/2016 0605   ALKPHOS 63 05/09/2016 0523   AST 23 05/09/2016 0523   ALT 18 05/09/2016 0523   BILITOT 0.7 05/09/2016 0523       Lab Results  Component Value Date   WBC 8.4 08/15/2016   HGB 12.4 08/15/2016   HCT 39.3 08/15/2016   MCV 91.4 08/15/2016   PLT 256 08/15/2016   NEUTROABS 14.9 (H) 05/08/2016    ASSESSMENT & PLAN:  Carcinoma of upper-outer quadrant of right breast in female, estrogen receptor positive ([place]) Rt Lumpectomy 08/14/16: IDC grade 2, 1.8 cm, 2/15 LN, ER 90%, PR 60%, Ki-67 12%, HER-2 negative ratio 0.41; T1CN1 (Stage 2A)  Mammaprint: High Risk: Risk of recurrence 20% at 5 years and 29% of 10 years. Chemotherapy would most likely decrease the risk of recurrence at 5 years to 5.4%. (Patient refused chemotherapy and radiation)  Current treatment: Anastrozole 1 mg by mouth daily restarted 09/01/2016  Anastrozole toxicities: Denies any hot flashes or myalgias.  Breast cancer surveillance:  Annual mammograms and periodic breast exams Return to clinic in 6 months for follow-up  I spent 25 minutes talking to the patient of which more than half was spent in counseling and coordination of care.  No orders of the defined types were placed in this encounter.  The patient has a good understanding of the overall plan. she agrees with it. she will call with any problems that may develop before the next visit here.   Rulon Eisenmenger, MD 12/02/16

## 2016-12-02 NOTE — Telephone Encounter (Signed)
Spoke with the patient and scheduled appts per 9/4 los. Did not want avs or calendar.

## 2016-12-02 NOTE — Assessment & Plan Note (Signed)
Rt Lumpectomy 08/14/16: IDC grade 2, 1.8 cm, 2/15 LN, ER 90%, PR 60%, Ki-67 12%, HER-2 negative ratio 0.41; T1CN1 (Stage 2A)  Mammaprint: High Risk: Risk of recurrence 20% at 5 years and 29% of 10 years. Chemotherapy would most likely decrease the risk of recurrence at 5 years to 5.4%. (Patient refused chemotherapy and radiation)  Current treatment: Anastrozole 1 mg by mouth daily restarted 09/01/2016  Anastrozole toxicities:  Return to clinic in 6 months for follow-up

## 2016-12-08 ENCOUNTER — Ambulatory Visit: Payer: PPO | Admitting: Physical Therapy

## 2016-12-08 ENCOUNTER — Ambulatory Visit: Payer: PPO | Attending: Hematology and Oncology | Admitting: Physical Therapy

## 2016-12-08 DIAGNOSIS — R600 Localized edema: Secondary | ICD-10-CM | POA: Diagnosis not present

## 2016-12-08 DIAGNOSIS — M25611 Stiffness of right shoulder, not elsewhere classified: Secondary | ICD-10-CM | POA: Insufficient documentation

## 2016-12-08 DIAGNOSIS — M25511 Pain in right shoulder: Secondary | ICD-10-CM | POA: Diagnosis not present

## 2016-12-08 DIAGNOSIS — M6281 Muscle weakness (generalized): Secondary | ICD-10-CM | POA: Diagnosis not present

## 2016-12-08 NOTE — Therapy (Signed)
[place], [place], [address] Phone: [phone]   Fax:  [phone]  Physical Therapy Evaluation  Patient Details  Name: Linda [name] MRN: [id number] Date of Birth: [birth year] Referring Provider: Dr. Nicholas Lose  Encounter Date: 12/08/2016      PT End of Session - 12/08/16 2058    Visit Number 1   Number of Visits 9   Date for PT Re-Evaluation 01/16/17   PT Start Time 2505   PT Stop Time 1657   PT Time Calculation (min) 50 min   Activity Tolerance Patient tolerated treatment well   Behavior During Therapy [facility] for tasks assessed/performed      Past Medical History:  Diagnosis Date  . Arthritis    "qwhere; hands, feet, different body parts from time to time" (05/08/2016)  . Asthma   . Breast mass 07/03/2016  . Cancer [facility])    right breast  . Complication of anesthesia    "they used an adult tube going down my throat; caused alot of problems; was told to always have pediatric tube placed" (05/08/2016)  . COPD (chronic obstructive pulmonary disease) ([place])   . GERD (gastroesophageal reflux disease)   . History of hiatal hernia   . Hyperglycemia   . Hyperlipidemia   . Hypertension   . Hypothyroidism   . Lump of breast, right    "suppose to have it checked soon" (05/08/2016)  . Migraine    "none for years" (05/08/2016)  . Osteopenia   . Pneumonia "several times"  . Status post radioactive iodine thyroid ablation   . Thyroid disease     Past Surgical History:  Procedure Laterality Date  . 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 APPENDECTOMY  1963  . BREAST LUMPECTOMY WITH AXILLARY LYMPH NODE DISSECTION Right 08/14/2016   Procedure: RIGHT BREAST LUMPECTOMY WITH AXILLARY LYMPH NODE DISSECTION;  Surgeon: Stark Klein, MD;  Location: [facility];  Service: General;  Laterality: Right;  . DILATION AND CURETTAGE OF UTERUS  "several"   "when I was young"  . INGUINAL HERNIA  REPAIR Bilateral 2000  . MASS EXCISION Right 08/14/2016   Procedure: EXCISION RIGHT ARM MASS 2X3CM;  Surgeon: Stark Klein, MD;  Location: [facility];  Service: General;  Laterality: Right;  . POLYPECTOMY     "vocal cord"  . TONSILLECTOMY    . TOTAL HIP ARTHROPLASTY Left 05/09/2016   Procedure: TOTAL HIP ARTHROPLASTY ANTERIOR APPROACH;  Surgeon: Paralee Cancel, MD;  Location: [facility];  Service: Orthopedics;  Laterality: Left;  . TUBAL LIGATION    . VAGINAL HYSTERECTOMY  1979    There were no vitals filed for this visit.       Subjective Assessment - 12/08/16 1609    Subjective "I'm still having problems with feeling like I have extra padding in this area (right upper flank).  I've used this compression sleeve when I work out, but still at night I'm having to put a pillow under my arm to relieve the pressue there.  Having trouble trying to reach to her back."   Pertinent History Diagnosed with right breast cancer and the had lumpectomy 08/14/16 with 15 lymph nodes removed, 2 positive. Radiation was completed 11/17/16.  She says axilla was targeted.  Is on anastrozole.  Broke hip at gym in February and had a THA on the left side. COPD  HTN and on medication. Has had arm and leg lipomas with right ankle  surgery x 2. Osteopenia. h/o right rotator cuff injury with physical therapy for that, full recovery.   Patient Stated Goals to get this swelling down and to feel normal again   Currently in Pain? Yes   Pain Score 3    Pain Location Flank   Pain Orientation Right;Upper   Pain Descriptors / Indicators Discomfort   Aggravating Factors  doing her hair every morning, sleeping in one position, 8 lb. barbells at the gym or light weight on lat pulldown   Pain Relieving Factors go to lower weights at the gym; using [place] gel on area or Benadryl gel, wearing no bra            [facility] PT Assessment - 12/08/16 0001      Assessment   Medical Diagnosis right breast cancer with lumpectomy and ALND   Referring  Provider Dr. Nicholas Lose   Onset Date/Surgical Date 08/14/16   Hand Dominance Right   Prior Therapy none     Precautions   Precautions Other (comment)   Precaution Comments cancer precautions  does not lie flat in supine     Restrictions   Weight Bearing Restrictions No     Balance Screen   Has the patient fallen in the past 6 months No   Has the patient had a decrease in activity level because of a fear of falling?  No  is careful; sometimes feels like she doesn't pick up foot   Is the patient reluctant to leave their home because of a fear of falling?  No     Home Environment   Living Environment Private residence   Living Arrangements Alone   Type of [place] One level     Prior Function   Level of Independence Independent   Vocation Retired   Leisure goes to gym 3x/week, does [employer] for whole body; does 20 mins. of cardio on treadmill or bike     Cognition   Overall Cognitive Status Within Functional Limits for tasks assessed     Observation/Other Assessments   Observations right breast is slightly bigger and firmer than left; right lateral breast/upper flank area and going posterior toward back is mildly swollen; right scapula wings when patient attempts protraction  no visible cording with right arm in abduction   Skin Integrity right lower axillary area scar (one scar only) is healed but with mild skin flakiness around it; area is just slightly darkened from radiation     Posture/Postural Control   Posture/Postural Control No significant limitations     ROM / Strength   AROM / PROM / Strength AROM     AROM   AROM Assessment Site Shoulder   Right/Left Shoulder Right;Left   Right Shoulder Flexion 104 Degrees  pulls at axilla   Right Shoulder ABduction 135 Degrees  discomfort at shoulder   Right Shoulder Internal Rotation --  WFL in supine   Right Shoulder External Rotation 75 Degrees   Left Shoulder Flexion 160 Degrees   Left Shoulder  ABduction 183 Degrees   Left Shoulder Internal Rotation --  WFL in supine   Left Shoulder External Rotation 75 Degrees     Ambulation/Gait   Ambulation/Gait Yes   Ambulation/Gait Assistance 6: Modified independent (Device/Increase time)   Stairs --  says she goes down stairs with step-to pattern           LYMPHEDEMA/ONCOLOGY QUESTIONNAIRE - 12/08/16 1628      Type   Cancer Type right  breast     Surgeries   Lumpectomy Date 08/14/16   Axillary Lymph Node Dissection Date 08/14/16   Number Lymph Nodes Removed 15     Treatment   Past Radiation Treatment Yes   Date 11/17/16   Body Site right breast and axilla   Current Hormone Treatment Yes   Drug Name anastrozole     Lymphedema Assessments   Lymphedema Assessments Upper extremities         Objective measurements completed on examination: See above findings.          [place] Adult PT Treatment/Exercise - 12/08/16 0001      Self-Care   Self-Care Other Self-Care Comments   Other Self-Care Comments  she is wearing a very loose-fitting bra today; began discussion of benefit of compression  she may try her smocked post-surgical compressive vest again                PT Education - 12/08/16 2059    Education provided Yes   Education Details began to educate about the benefit of compression on areas of swelling and how that can be achieved; also educated about treatment plans and that the swelling she feels now may be post-surgical/post-radiation and resolve over time   Person(s) Educated Patient   Methods Explanation   Comprehension Verbalized understanding                [place] - 12/08/16 2111      CC Long Term Goal  #1   Title Pt. will be independent in performing manual lymph drainage for right flank area.   Time 4   Period Weeks   Status New   Target Date 01/14/17     CC Long Term Goal  #2   Title Pt. will be knowledgeable about appropriate compression garments (bra,  T-shirt or vest options)   Time 4   Period Weeks   Status New   Target Date 01/14/17     CC Long Term Goal  #3   Title Pt. will report at least 60% decrease in right shoulder discomfort with moving her arm to end ROM.   Time 4   Period Weeks   Status New   Target Date 01/14/17     CC Long Term Goal  #4   Title Rt. shoulder flexion to at least 140 degrees for improved overhead reach.   Baseline 104 at eval compared to 160 on Lt.   Time 4   Period Weeks   Status New   Target Date 01/14/17     CC Long Term Goal  #5   Title Rt. shoulder abduction to at least 160 degrees for improved ADLs.   Baseline 135 at eval compared to 183 on left   Time 4   Period Weeks   Status New     CC Long Term Goal  #6   Title Pt. will be knowledgeable about lymphedema risk reduction.   Time 4   Period Weeks   Status New   Target Date 01/14/17     CC Long Term Goal  #7   Title Lymphedema Life Impact Scale score reduced to <20% impairment   Baseline 28% impairment on eval   Time 4   Period Weeks   Status New   Target Date 01/14/17             Plan - 12/08/16 2059    Clinical Impression Statement This is a pleasant woman s/p lumpectomy and XRT  for right breast cancer.  She had ALND and two nodes were positive; she declined chemotherapy, but is on anastrozole.  She has moderately limited right shoulder ROM and pain with movement into end range; she also c/o swelling at right upper flank going toward her back.  Her right breast appears larger than the left and is mildly indurated. Began discussion today of sports or compression bras and why those can be useful. Pt. has a compression sleeve that she wears at the gym.  She has winging of right scapula.   History and Personal Factors relevant to plan of care: right RTC injury with h/o therapy for that; left THA in early 2016-07-03; osteopenia; right ankle surgery x 2 with limitations in function   Clinical Presentation Evolving   Clinical Presentation  due to: still recent surgery and XRT just finished last month   Clinical Decision Making Moderate   Rehab Potential Good   PT Frequency 2x / week   PT Duration 4 weeks   PT Treatment/Interventions ADLs/Self Care Home Management;Electrical Stimulation;DME Instruction;Therapeutic exercise;Patient/family education;Manual techniques;Manual lymph drainage;Passive range of motion;Taping   PT Next Visit Plan Begin P/AA/A/ROM to right shoulder, myofascial release to same; HEP instruction for ROM; check sports bras if she brings them in and educate about proper compression at right flank and breast; later, instruct in lymphedema risk reduction practices.  Include shoulder strengthening, especially for serratus as she has winging of right scapula.    Consulted and Agree with Plan of Care Patient      Patient will benefit from skilled therapeutic intervention in order to improve the following deficits and impairments:  Pain, Decreased range of motion, Impaired UE functional use, Increased edema, Decreased knowledge of use of DME, Decreased knowledge of precautions  Visit Diagnosis: Stiffness of right shoulder, not elsewhere classified - Plan: PT plan of care cert/re-cert  Right shoulder pain, unspecified chronicity - Plan: PT plan of care cert/re-cert  Localized edema - Plan: PT plan of care cert/re-cert  Muscle weakness (generalized) - Plan: PT plan of care cert/re-cert      G-Codes - 01/25/24 07/04/15    Functional Assessment Tool Used (Outpatient Only) Lymphedema life impact scale   Functional Limitation Self care   Self Care Current Status (D6644) At least 20 percent but less than 40 percent impaired, limited or restricted   Self Care Goal Status (I3474) At least 1 percent but less than 20 percent impaired, limited or restricted       Problem List Patient Active Problem List   Diagnosis Date Noted  . Breast cancer metastasized to axillary lymph node, right ([place]) 08/14/2016  . Carcinoma of  upper-outer quadrant of right breast in female, estrogen receptor positive ([place]) 07/25/2016  . Hypothyroidism 05/09/2016  . Closed left femoral fracture ([place]) 05/08/2016  . COPD, group D, by GOLD 07-04-15 classification ([place]) 01/28/2011    SALISBURY,DONNA 12/08/2016, 9:21 PM  [place], [place], [address] Phone: [phone]   Fax:  [phone]  Name: Linda [name] MRN: [id number] Date of Birth: Oct 01, 1945  Serafina Royals, PT 12/08/16 9:22 PM

## 2016-12-10 ENCOUNTER — Ambulatory Visit: Payer: PPO | Admitting: Physical Therapy

## 2016-12-10 ENCOUNTER — Ambulatory Visit: Payer: PPO

## 2016-12-10 DIAGNOSIS — M25611 Stiffness of right shoulder, not elsewhere classified: Secondary | ICD-10-CM | POA: Diagnosis not present

## 2016-12-10 DIAGNOSIS — R600 Localized edema: Secondary | ICD-10-CM

## 2016-12-10 DIAGNOSIS — M25511 Pain in right shoulder: Secondary | ICD-10-CM

## 2016-12-10 NOTE — Patient Instructions (Signed)

## 2016-12-10 NOTE — Therapy (Signed)
[place], [place], [address] Phone: [phone]   Fax:  [phone]  Physical Therapy Treatment  Patient Details  Name: Linda [name] MRN: [id number] Date of Birth: 1945-12-20 Referring Provider: Dr. Nicholas Lose  Encounter Date: 12/10/2016      PT End of Session - 12/10/16 1723    Visit Number 2   Number of Visits 9   Date for PT Re-Evaluation 01/16/17   PT Start Time 5329   PT Stop Time 1609   PT Time Calculation (min) 52 min   Activity Tolerance Patient tolerated treatment well   Behavior During Therapy [facility] for tasks assessed/performed      Past Medical History:  Diagnosis Date  . Arthritis    "qwhere; hands, feet, different body parts from time to time" (05/08/2016)  . Asthma   . Breast mass 07/03/2016  . Cancer [facility])    right breast  . Complication of anesthesia    "they used an adult tube going down my throat; caused alot of problems; was told to always have pediatric tube placed" (05/08/2016)  . COPD (chronic obstructive pulmonary disease) ([place])   . GERD (gastroesophageal reflux disease)   . History of hiatal hernia   . Hyperglycemia   . Hyperlipidemia   . Hypertension   . Hypothyroidism   . Lump of breast, right    "suppose to have it checked soon" (05/08/2016)  . Migraine    "none for years" (05/08/2016)  . Osteopenia   . Pneumonia "several times"  . Status post radioactive iodine thyroid ablation   . Thyroid disease     Past Surgical History:  Procedure Laterality Date  . 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 APPENDECTOMY  1963  . BREAST LUMPECTOMY WITH AXILLARY LYMPH NODE DISSECTION Right 08/14/2016   Procedure: RIGHT BREAST LUMPECTOMY WITH AXILLARY LYMPH NODE DISSECTION;  Surgeon: Stark Klein, MD;  Location: [place];  Service: General;  Laterality: Right;  . DILATION AND CURETTAGE OF UTERUS  "several"   "when I was young"  . INGUINAL HERNIA  REPAIR Bilateral 2000  . MASS EXCISION Right 08/14/2016   Procedure: EXCISION RIGHT ARM MASS 2X3CM;  Surgeon: Stark Klein, MD;  Location: [place];  Service: General;  Laterality: Right;  . POLYPECTOMY     "vocal cord"  . TONSILLECTOMY    . TOTAL HIP ARTHROPLASTY Left 05/09/2016   Procedure: TOTAL HIP ARTHROPLASTY ANTERIOR APPROACH;  Surgeon: Paralee Cancel, MD;  Location: [place];  Service: Orthopedics;  Laterality: Left;  . TUBAL LIGATION    . VAGINAL HYSTERECTOMY  1979    There were no vitals filed for this visit.      Subjective Assessment - 12/10/16 1524    Subjective I'm just always in pain, have been since Febraury.   Pertinent History Diagnosed with right breast cancer and the had lumpectomy 08/14/16 with 15 lymph nodes removed, 2 positive. Radiation was completed 11/17/16.  She says axilla was targeted.  Is on anastrozole.  Broke hip at gym in February and had a THA on the left side. COPD  HTN and on medication. Has had arm and leg lipomas with right ankle surgery x 2. Osteopenia. h/o right rotator cuff injury with physical therapy for that, full recovery.   Patient Stated Goals to get this swelling down and to feel normal again   Currently in Pain? Yes   Pain Score 3    Pain  Location Flank   Pain Orientation Right;Upper   Pain Type Surgical pain   Pain Onset More than a month ago   Aggravating Factors  the sports bra isn't comfortable   Pain Relieving Factors moving, stretching, wearing no bra                         [facility] Adult PT Treatment/Exercise - 12/10/16 0001      Shoulder Exercises: Pulleys   Flexion 1 minute   ABduction 2 minutes     Shoulder Exercises: Therapy Ball   Flexion 10 reps  With forward lean into end of stretch   Flexion Limitations VC for gentle, slow stretch   ABduction 5 reps  Rt UE with side lean into end of stretch     Shoulder Exercises: ROM/Strengthening   Other ROM/Strengthening Exercises Neural Tension Stretch with Rt UE 3  times for 5 sec holds.with VCs for slow, gentle stretching     Manual Therapy   Manual Therapy Passive ROM;Myofascial release   Manual therapy comments Assessed pts sports bra which seemed to be a good fit for pt but her skin near axilla at edge of bra was red and irritated here so issued gray foam in thick stockinette for pt to wear there to derease friction. She reported that feeling much better. Also issued pictures of other compression bra options and instructed her to [facility] an app[facility] to be measured for one if she would like to go that route.    Myofascial Release Rt UE pulling during P/ROM   Passive ROM In Supine with HOB elevated due to COPD: To Rt shoulder into flexion, abduction, and er to pts tolerance                PT Education - 12/10/16 1722    Education provided Yes   Education Details Issued supine dowel exercises handout to pt but did not have time to have her perform these other than with a demonstration.    Person(s) Educated Patient   Methods Explanation;Demonstration;Handout   Comprehension Verbalized understanding;Need further instruction                [facility] Goals - 12/08/16 2111      CC Long Term Goal  #1   Title Pt. will be independent in performing manual lymph drainage for right flank area.   Time 4   Period Weeks   Status New   Target Date 01/14/17     CC Long Term Goal  #2   Title Pt. will be knowledgeable about appropriate compression garments (bra, T-shirt or vest options)   Time 4   Period Weeks   Status New   Target Date 01/14/17     CC Long Term Goal  #3   Title Pt. will report at least 60% decrease in right shoulder discomfort with moving her arm to end ROM.   Time 4   Period Weeks   Status New   Target Date 01/14/17     CC Long Term Goal  #4   Title Rt. shoulder flexion to at least 140 degrees for improved overhead reach.   Baseline 104 at eval compared to 160 on Lt.   Time 4   Period Weeks    Status New   Target Date 01/14/17     CC Long Term Goal  #5   Title Rt. shoulder abduction to at least 160 degrees for improved  ADLs.   Baseline 135 at eval compared to 183 on left   Time 4   Period Weeks   Status New     CC Long Term Goal  #6   Title Pt. will be knowledgeable about lymphedema risk reduction.   Time 4   Period Weeks   Status New   Target Date 01/14/17     CC Long Term Goal  #7   Title Lymphedema Life Impact Scale score reduced to <20% impairment   Baseline 28% impairment on eval   Time 4   Period Weeks   Status New   Target Date 01/14/17            Plan - 12/10/16 1723    Clinical Impression Statement Pt tolerated first session of AA/ROM well though did require multiple VC for slow, gentle stretching and to not push into pain. She also did very well with manual therapy and P/ROM reporting some brief sharp pains during but said her Rt shouldre felt so good at end of session. Did not have time to have pt demonstrate supine exercises today but felt comfortable issuing them to pt after therapist demonstrated them and answered her questions.    Rehab Potential Good   PT Frequency 2x / week   PT Duration 4 weeks   PT Treatment/Interventions ADLs/Self Care Home Management;Electrical Stimulation;DME Instruction;Therapeutic exercise;Patient/family education;Manual techniques;Manual lymph drainage;Passive range of motion;Taping   PT Next Visit Plan Cont P/AA/A/ROM to right shoulder, myofascial release to same; HEP instruction for ROM; later, instruct in lymphedema risk reduction practices.  Include shoulder strengthening, especially for serratus as she has winging of right scapula.       Patient will benefit from skilled therapeutic intervention in order to improve the following deficits and impairments:  Pain, Decreased range of motion, Impaired UE functional use, Increased edema, Decreased knowledge of use of DME, Decreased knowledge of precautions  Visit  Diagnosis: Stiffness of right shoulder, not elsewhere classified  Right shoulder pain, unspecified chronicity  Localized edema     Problem List Patient Active Problem List   Diagnosis Date Noted  . Breast cancer metastasized to axillary lymph node, right ([place]) 08/14/2016  . Carcinoma of upper-outer quadrant of right breast in female, estrogen receptor positive ([place]) 07/25/2016  . Hypothyroidism 05/09/2016  . Closed left femoral fracture ([place]) 05/08/2016  . COPD, group D, by GOLD 2017 classification ([place]) 01/28/2011    Otelia Limes, PTA 12/10/2016, 5:29 PM  [place], [place], [address] Phone: [phone]   Fax:  [phone]  Name: Linda [name] MRN: [id number] Date of Birth: 08-Nov-1945

## 2016-12-15 ENCOUNTER — Ambulatory Visit: Payer: PPO | Admitting: Physical Therapy

## 2016-12-15 DIAGNOSIS — M25511 Pain in right shoulder: Secondary | ICD-10-CM

## 2016-12-15 DIAGNOSIS — M25611 Stiffness of right shoulder, not elsewhere classified: Secondary | ICD-10-CM

## 2016-12-15 NOTE — Therapy (Signed)
[place], [place], [address] Phone: [phone]   Fax:  [phone]  Physical Therapy Treatment  Patient Details  Name: Linda [name] MRN: [id number] Date of Birth: [birth year] Referring Provider: Dr. Nicholas Lose  Encounter Date: 12/15/2016      PT End of Session - 12/15/16 1707    Visit Number 3   Date for PT Re-Evaluation 01/16/17   PT Start Time 1421   PT Stop Time 1517   PT Time Calculation (min) 56 min   Activity Tolerance Patient tolerated treatment well   Behavior During Therapy [facility] for tasks assessed/performed      Past Medical History:  Diagnosis Date  . Arthritis    "qwhere; hands, feet, different body parts from time to time" (05/08/2016)  . Asthma   . Breast mass 07/03/2016  . Cancer [facility])    right breast  . Complication of anesthesia    "they used an adult tube going down my throat; caused alot of problems; was told to always have pediatric tube placed" (05/08/2016)  . COPD (chronic obstructive pulmonary disease) ([place])   . GERD (gastroesophageal reflux disease)   . History of hiatal hernia   . Hyperglycemia   . Hyperlipidemia   . Hypertension   . Hypothyroidism   . Lump of breast, right    "suppose to have it checked soon" (05/08/2016)  . Migraine    "none for years" (05/08/2016)  . Osteopenia   . Pneumonia "several times"  . Status post radioactive iodine thyroid ablation   . Thyroid disease     Past Surgical History:  Procedure Laterality Date  . 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 APPENDECTOMY  1963  . BREAST LUMPECTOMY WITH AXILLARY LYMPH NODE DISSECTION Right 08/14/2016   Procedure: RIGHT BREAST LUMPECTOMY WITH AXILLARY LYMPH NODE DISSECTION;  Surgeon: Stark Klein, MD;  Location: [place];  Service: General;  Laterality: Right;  . DILATION AND CURETTAGE OF UTERUS  "several"   "when I was young"  . INGUINAL HERNIA REPAIR Bilateral 2000   . MASS EXCISION Right 08/14/2016   Procedure: EXCISION RIGHT ARM MASS 2X3CM;  Surgeon: Stark Klein, MD;  Location: [place];  Service: General;  Laterality: Right;  . POLYPECTOMY     "vocal cord"  . TONSILLECTOMY    . TOTAL HIP ARTHROPLASTY Left 05/09/2016   Procedure: TOTAL HIP ARTHROPLASTY ANTERIOR APPROACH;  Surgeon: Paralee Cancel, MD;  Location: [place];  Service: Orthopedics;  Laterality: Left;  . TUBAL LIGATION    . VAGINAL HYSTERECTOMY  1979    There were no vitals filed for this visit.      Subjective Assessment - 12/15/16 1421    Subjective "I have hurt all weekend.  I don't know if the stretching did that." "I ordered one was of those bras but it won't come until next Monday." Still wearing sports bras and using the pad in it.   Pertinent History Diagnosed with right breast cancer and the had lumpectomy 08/14/16 with 15 lymph nodes removed, 2 positive. Radiation was completed 11/17/16.  She says axilla was targeted.  Is on anastrozole.  Broke hip at gym in February and had a THA on the left side. COPD  HTN and on medication. Has had arm and leg lipomas with right ankle surgery x 2. Osteopenia. h/o right rotator cuff injury with physical therapy for that, full recovery.   Currently in Pain? Yes  Pain Location Chest  also axilla, to upper back   Pain Orientation Right;Upper;Lateral   Pain Descriptors / Indicators Other (Comment)  it just hurts, it's sore, like little sharp pains   Aggravating Factors  possibly the shoulder abduction stretches   Pain Relieving Factors didn't try anything                         [facility] Adult PT Treatment/Exercise - 12/15/16 0001      Shoulder Exercises: Supine   Protraction Strengthening;Right;Left;10 reps;Weights   Protraction Weight (lbs) 3 lbs. each hand   Flexion AAROM;Both;5 reps  with dowel, emphasized GENTLE stretch, not pain   Other Supine Exercises reviewed rest of supine scapular series, but patient isn't feeling much  stretch with these, so may change HEP     Shoulder Exercises: Standing   Retraction Strengthening;Both;10 reps;Theraband   Theraband Level (Shoulder Retraction) Level 1 (Yellow)   Other Standing Exercises wall pushups with a plus x 10   Other Standing Exercises AA flexion of right shoulder with dowel; same for abduction     Manual Therapy   Myofascial Release unilateral pulling at right upper outer chest toward axilla; crosshands technique from superior to axilla to right abdomen in vertical and slightly diagonal directions                PT Education - 12/15/16 1706    Education provided Yes   Education Details to do standing dowel exercises for right shoulder flexion and abduction only, otherwise no UE exercise   Person(s) Educated Patient   Methods Explanation;Handout   Comprehension Verbalized understanding;Returned demonstration                [name] Goals - 12/08/16 2111      CC Long Term Goal  #1   Title Pt. will be independent in performing manual lymph drainage for right flank area.   Time 4   Period Weeks   Status New   Target Date 01/14/17     CC Long Term Goal  #2   Title Pt. will be knowledgeable about appropriate compression garments (bra, T-shirt or vest options)   Time 4   Period Weeks   Status New   Target Date 01/14/17     CC Long Term Goal  #3   Title Pt. will report at least 60% decrease in right shoulder discomfort with moving her arm to end ROM.   Time 4   Period Weeks   Status New   Target Date 01/14/17     CC Long Term Goal  #4   Title Rt. shoulder flexion to at least 140 degrees for improved overhead reach.   Baseline 104 at eval compared to 160 on Lt.   Time 4   Period Weeks   Status New   Target Date 01/14/17     CC Long Term Goal  #5   Title Rt. shoulder abduction to at least 160 degrees for improved ADLs.   Baseline 135 at eval compared to 183 on left   Time 4   Period Weeks   Status New     CC Long  Term Goal  #6   Title Pt. will be knowledgeable about lymphedema risk reduction.   Time 4   Period Weeks   Status New   Target Date 01/14/17     CC Long Term Goal  #7   Title Lymphedema Life Impact Scale score reduced to <20%  impairment   Baseline 28% impairment on eval   Time 4   Period Weeks   Status New   Target Date 01/14/17            Plan - 12/15/16 1707    Clinical Impression Statement Pt. reports ongoing right axilla-area pain, perhaps aggravated by wall walking for right shoulder abduction, but it is difficult to assess what/whether the things she is doing are causing a problem.  Most motions hurt and she tends to push through discomfort.  She was told today to stop all UE exercise EXCEPT standing dowel right shoulder flexion and abduction.  I used the analogy of an "elimination diet," where we minimize what she does with that arm in order to sort out what is and what isn't aggravating the pain there. She was asked to try not to think in the terms she has been thinking, that "maybe it's supposed to hurt." Also did some exercise to try to strengthen scapular muscles to decrease right winging.   Rehab Potential Good   PT Frequency 2x / week   PT Duration 4 weeks   PT Treatment/Interventions ADLs/Self Care Home Management;Electrical Stimulation;DME Instruction;Therapeutic exercise;Patient/family education;Manual techniques;Manual lymph drainage;Passive range of motion;Taping   PT Next Visit Plan Cont P/AA/A/ROM to right shoulder, myofascial release to same; HEP instruction for ROM; later, instruct in lymphedema risk reduction practices.  Include shoulder strengthening, especially for serratus as she has winging of right scapula.    Consulted and Agree with Plan of Care Patient      Patient will benefit from skilled therapeutic intervention in order to improve the following deficits and impairments:  Pain, Decreased range of motion, Impaired UE functional use, Increased edema,  Decreased knowledge of use of DME, Decreased knowledge of precautions  Visit Diagnosis: Stiffness of right shoulder, not elsewhere classified  Right shoulder pain, unspecified chronicity     Problem List Patient Active Problem List   Diagnosis Date Noted  . Breast cancer metastasized to axillary lymph node, right ([place]) 08/14/2016  . Carcinoma of upper-outer quadrant of right breast in female, estrogen receptor positive ([place]) 07/25/2016  . Hypothyroidism 05/09/2016  . Closed left femoral fracture ([place]) 05/08/2016  . COPD, group D, by GOLD 2017 classification ([place]) 01/28/2011    Linda [name] 12/15/2016, 5:13 PM  [place], [place], [address] Phone: [phone]   Fax:  [phone]  Name: Linda [name] MRN: [id number] Date of Birth: [birth year]  Serafina Royals, PT 12/15/16 5:13 PM

## 2016-12-15 NOTE — Patient Instructions (Signed)
For now, please stop the other exercises you were given.  Try these, and please stretch as gently as possible:  Flexion (Eccentric) - Active-Assist (Cane)          Cancer Rehab [phone]    Use unaffected arm to push affected arm forward. Avoid hiking shoulder (shoulder should NOT touch cheek). Keep palm relaxed. Slowly lower affected arm. Hold stretch for _5_ seconds repeating _5-10_ times, _1-2_ times a day.  Abduction (Eccentric) - Active-Assist (Cane)    Use unaffected arm to push affected arm out to side. Avoid hiking shoulder (shoulder should NOT touch cheek). Keep palm relaxed. Slowly lower affected arm. Hold stretch _5_ seconds repeating _5-10_ times, _1-2_ times a day.   Standing in doorway, place hands on wall with elbows bent at shoulder height and place one foot in front of other. Shift weight onto front foot. Hold _10-20__ seconds. Do _3-5_ times, _1-2_ times a day.

## 2016-12-16 ENCOUNTER — Encounter: Payer: Self-pay | Admitting: Pulmonary Disease

## 2016-12-16 ENCOUNTER — Ambulatory Visit (INDEPENDENT_AMBULATORY_CARE_PROVIDER_SITE_OTHER): Payer: PPO | Admitting: Pulmonary Disease

## 2016-12-16 VITALS — BP 142/78 | HR 102 | Ht 64.0 in | Wt 118.8 lb

## 2016-12-16 DIAGNOSIS — Z23 Encounter for immunization: Secondary | ICD-10-CM | POA: Diagnosis not present

## 2016-12-16 DIAGNOSIS — J449 Chronic obstructive pulmonary disease, unspecified: Secondary | ICD-10-CM | POA: Diagnosis not present

## 2016-12-16 NOTE — Patient Instructions (Signed)
Continue inhalers as prescribed We will give a flu vaccine today Follow up in 6 months. Please call if there is any worsening of symptoms.

## 2016-12-16 NOTE — Progress Notes (Signed)
Linda [name]    [id number]    Aug 19, 1945  Primary Care Physician:Prochnau, Chrys Racer, MD  Referring Physician: Ernestene Kiel, MD [place]. [place], [place] [address]  Chief complaint:   Follow up for  COPD GOLD D  HPI: Linda [name] is a 71 year old with COPD (GOLD D, CAT score 25, multiple exacerbation over past year, FEV1 41%). She was seen by Dr. Lamonte Sakai in 2012. She has not followed up in our office since then. She has been maintained on Spiriva and Symbicort. His symptoms have been stable until this year when she has several exacerbations over the winter requiring courses of antibiotics and steroids. Her primary care physician has referred her back to [place] for further management. Her chief complaint is dyspnea on exertion, loss of energy, fatigue,  chronic cough with sputum production.   Pneumovax- Uptodate at primary office Flu vaccine- 2018  Interim History: She's had a recent diagnosis of breast cancer and underwent lumpectomy and XRT. She just finished her last radiation treatment recently She declined chemotherapy due to concern about side effects. She continues on Symbicort, Spiriva and daliresp. Complains of stable dyspnea on exertion at baseline.  Outpatient Encounter Prescriptions as of 12/16/2016  Medication Sig  . albuterol (PROVENTIL HFA;VENTOLIN HFA) 108 (90 Base) MCG/ACT inhaler Inhale 1-2 puffs into the lungs every 6 (six) hours as needed for wheezing or shortness of breath. (Patient not taking: Reported on 12/08/2016)  . amLODipine (NORVASC) 5 MG tablet Take 5 mg by mouth daily before breakfast.   . anastrozole (ARIMIDEX) 1 MG tablet Take 1 tablet (1 mg total) by mouth daily.  . budesonide-formoterol (SYMBICORT) 160-4.5 MCG/ACT inhaler Inhale 2 puffs into the lungs 2 (two) times daily.  . Cholecalciferol (VITAMIN D3) 2000 units capsule Take 2,000 Units by mouth every 14 (fourteen) days. Once every 2-3 weeks (when she remembers)  . DALIRESP 500 MCG TABS  tablet TAKE ONE TABLET BY MOUTH DAILY (Patient taking differently: TAKE ONE TABLET BY MOUTH DAILY AFTER BREAKFAST)  . DALIRESP 500 MCG TABS tablet TAKE ONE TABLET BY MOUTH ONCE DAILY  . levothyroxine (SYNTHROID, LEVOTHROID) 88 MCG tablet Take 88 mcg by mouth daily before breakfast.   . losartan (COZAAR) 100 MG tablet Take 100 mg by mouth daily before breakfast.   . meloxicam (MOBIC) 15 MG tablet Take 15 mg by mouth daily before breakfast.   . omeprazole (PRILOSEC) 20 MG capsule Take 20 mg by mouth daily before breakfast.   . Omeprazole-Sodium Bicarbonate (ZEGERID OTC) 20-1100 MG CAPS capsule Take 1 capsule by mouth daily before breakfast.  . polyethylene glycol (MIRALAX / GLYCOLAX) packet Take 17 g by mouth 2 (two) times daily. (Patient not taking: Reported on 12/08/2016)  . pravastatin (PRAVACHOL) 20 MG tablet Take 20 mg by mouth daily before breakfast.   . Probiotic Product ([place]) Take by mouth.  . SPIRIVA RESPIMAT 2.5 MCG/ACT AERS Inhale 2 puffs into the lungs daily after breakfast.  . traMADol (ULTRAM) 50 MG tablet 50 mg.   No facility-administered encounter medications on file as of 12/16/2016.     Allergies as of 12/16/2016 - Review Complete 12/10/2016  Allergen Reaction Noted  . Aspirin  01/27/2011    Past Medical History:  Diagnosis Date  . Arthritis    "qwhere; hands, feet, different body parts from time to time" (05/08/2016)  . Asthma   . Breast mass 07/03/2016  . Cancer [facility])    right breast  . Complication  of anesthesia    "they used an adult tube going down my throat; caused alot of problems; was told to always have pediatric tube placed" (05/08/2016)  . COPD (chronic obstructive pulmonary disease) ([place])   . GERD (gastroesophageal reflux disease)   . History of hiatal hernia   . Hyperglycemia   . Hyperlipidemia   . Hypertension   . Hypothyroidism   . Lump of breast, right    "suppose to have it checked soon" (05/08/2016)  . Migraine    "none for  years" (05/08/2016)  . Osteopenia   . Pneumonia "several times"  . Status post radioactive iodine thyroid ablation   . Thyroid disease     Past Surgical History:  Procedure Laterality Date  . 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 APPENDECTOMY  1963  . BREAST LUMPECTOMY WITH AXILLARY LYMPH NODE DISSECTION Right 08/14/2016   Procedure: RIGHT BREAST LUMPECTOMY WITH AXILLARY LYMPH NODE DISSECTION;  Surgeon: Stark Klein, MD;  Location: [facility];  Service: General;  Laterality: Right;  . DILATION AND CURETTAGE OF UTERUS  "several"   "when I was young"  . INGUINAL HERNIA REPAIR Bilateral 2000  . MASS EXCISION Right 08/14/2016   Procedure: EXCISION RIGHT ARM MASS 2X3CM;  Surgeon: Stark Klein, MD;  Location: [place];  Service: General;  Laterality: Right;  . POLYPECTOMY     "vocal cord"  . TONSILLECTOMY    . TOTAL HIP ARTHROPLASTY Left 05/09/2016   Procedure: TOTAL HIP ARTHROPLASTY ANTERIOR APPROACH;  Surgeon: Paralee Cancel, MD;  Location: [place];  Service: Orthopedics;  Laterality: Left;  . TUBAL LIGATION    . VAGINAL HYSTERECTOMY  1979    Family History  Problem Relation Age of Onset  . Hypertension Father   . Hypertension Mother   . Stroke Mother   . Heart attack Mother   . Allergies Mother     Social History   Social History  . Marital status: Divorced    Spouse name: N/A  . Number of children: 3  . Years of education: N/A   Occupational History  . retired    Social History Main Topics  . Smoking status: Former Smoker    Packs/day: 1.00    Years: 25.00    Types: Cigarettes    Quit date: 03/31/2006  . Smokeless tobacco: Never Used  . Alcohol use 0.0 oz/week     Comment: 05/08/2016 "I'll have a drink on special occasions; usually wine"  . Drug use: No  . Sexual activity: No   Other Topics Concern  . Not on file   Social History Narrative  . No narrative on file    Review of systems: Review of Systems  Constitutional: Negative for fever  and chills.  HENT: Negative.   Eyes: Negative for blurred vision.  Respiratory: as per HPI  Cardiovascular: Negative for chest pain and palpitations.  Gastrointestinal: Negative for vomiting, diarrhea, blood per rectum. Genitourinary: Negative for dysuria, urgency, frequency and hematuria.  Musculoskeletal: Negative for myalgias, back pain and joint pain.  Skin: Negative for itching and rash.  Neurological: Negative for dizziness, tremors, focal weakness, seizures and loss of consciousness.  Endo/Heme/Allergies: Negative for environmental allergies.  Psychiatric/Behavioral: Negative for depression, suicidal ideas and hallucinations.  All other systems reviewed and are negative.  Physical Exam: Blood pressure (!) 142/78, pulse (!) 102, height 5\' 4"  (1.626 m), weight 118 lb 12.8 oz (53.9 kg), SpO2 95 %. Gen:  No acute distress HEENT:  EOMI, sclera anicteric Neck:     No masses; no thyromegaly Lungs:    Clear to auscultation bilaterally; normal respiratory effort CV:         Regular rate and rhythm; no murmurs Abd:      + bowel sounds; soft, non-tender; no palpable masses, no distension Ext:    No edema; adequate peripheral perfusion Skin:      Warm and dry; no rash Neuro: alert and oriented x 3 Psych: normal mood and affect  Data Reviewed: PFTs  02/18/11 FVC 2.42 [117%) FEV1 1.46 (69%) F/F 46 TLC 112% DLCO 69% Moderate obstructive defect, mild reduction in diffusion capacity.  08/23/15 FVC 2.14 [71%] FEV1 0.94 [41%] F/F 44 TLC 114% DLCO 57% Severe obstructive defect with moderate reduction in diffusion capacity  CT chest 02/26/15- mild left lower lobe atelectasis, moderate emphysematous changes.  I have reviewed the images personally  Assessment:  Severe COPD. GOLD stage D Symptoms are stable on a regimen of Symbicort, Spiriva and daliresp. She'll continue the same.. No desats on ambulation at last visit. We will recheck at next visit.   Offered pulmonary  rehabilitation but she is on already on an exercise program with a personal trainer. She is a candidate for low-dose screening CT given his smoking history but she is not interested in it now given the associated costs.  Plan/Recommendations: - Continue daliresp, Symbicort, Spiriva - Flu vaccination today.  Marshell Garfinkel MD [facility] Pager [phone] 12/16/2016, 2:14 PM  CC: Ernestene Kiel, MD

## 2016-12-19 ENCOUNTER — Ambulatory Visit: Payer: PPO | Admitting: Physical Therapy

## 2016-12-19 ENCOUNTER — Encounter: Payer: Self-pay | Admitting: Radiation Oncology

## 2016-12-19 DIAGNOSIS — M25611 Stiffness of right shoulder, not elsewhere classified: Secondary | ICD-10-CM | POA: Diagnosis not present

## 2016-12-19 DIAGNOSIS — M25511 Pain in right shoulder: Secondary | ICD-10-CM

## 2016-12-19 DIAGNOSIS — M6281 Muscle weakness (generalized): Secondary | ICD-10-CM

## 2016-12-19 DIAGNOSIS — R600 Localized edema: Secondary | ICD-10-CM

## 2016-12-19 NOTE — Therapy (Signed)
[place], [place], [address] Phone: [phone]   Fax:  [phone]  Physical Therapy Treatment  Patient Details  Name: Linda [name] MRN: [id number] Date of Birth: 07-Oct-1945 Referring Provider: Dr. Nicholas Lose  Encounter Date: 12/19/2016      PT End of Session - 12/19/16 1209    Visit Number 3   Number of Visits 9   Date for PT Re-Evaluation 01/16/17   PT Start Time 5188   PT Stop Time 1100   PT Time Calculation (min) 45 min   Activity Tolerance Patient tolerated treatment well   Behavior During Therapy [facility] for tasks assessed/performed      Past Medical History:  Diagnosis Date  . Arthritis    "qwhere; hands, feet, different body parts from time to time" (05/08/2016)  . Asthma   . Breast mass 07/03/2016  . Cancer [facility])    right breast  . Complication of anesthesia    "they used an adult tube going down my throat; caused alot of problems; was told to always have pediatric tube placed" (05/08/2016)  . COPD (chronic obstructive pulmonary disease) ([place])   . GERD (gastroesophageal reflux disease)   . History of hiatal hernia   . Hyperglycemia   . Hyperlipidemia   . Hypertension   . Hypothyroidism   . Lump of breast, right    "suppose to have it checked soon" (05/08/2016)  . Migraine    "none for years" (05/08/2016)  . Osteopenia   . Pneumonia "several times"  . Status post radioactive iodine thyroid ablation   . Thyroid disease     Past Surgical History:  Procedure Laterality Date  . 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 APPENDECTOMY  1963  . BREAST LUMPECTOMY WITH AXILLARY LYMPH NODE DISSECTION Right 08/14/2016   Procedure: RIGHT BREAST LUMPECTOMY WITH AXILLARY LYMPH NODE DISSECTION;  Surgeon: Stark Klein, MD;  Location: [place];  Service: General;  Laterality: Right;  . DILATION AND CURETTAGE OF UTERUS  "several"   "when I was young"  . INGUINAL HERNIA  REPAIR Bilateral 2000  . MASS EXCISION Right 08/14/2016   Procedure: EXCISION RIGHT ARM MASS 2X3CM;  Surgeon: Stark Klein, MD;  Location: [place];  Service: General;  Laterality: Right;  . POLYPECTOMY     "vocal cord"  . TONSILLECTOMY    . TOTAL HIP ARTHROPLASTY Left 05/09/2016   Procedure: TOTAL HIP ARTHROPLASTY ANTERIOR APPROACH;  Surgeon: Paralee Cancel, MD;  Location: [place];  Service: Orthopedics;  Laterality: Left;  . TUBAL LIGATION    . VAGINAL HYSTERECTOMY  1979    There were no vitals filed for this visit.      Subjective Assessment - 12/19/16 1159    Subjective Pt continues to have sharp pain in her right posterior axilla with repeated scapular retraction exercise and with palpation to this area.  She has been doing only dowel exercise and wall push up for serratus work   She got an [place] on [employer] and is wearing it today  but it does not seem to come high on her back or posterior axilla.  She says it seems to help her breast feel better    Pertinent History Diagnosed with right breast cancer and the had lumpectomy 08/14/16 with 15 lymph nodes removed, 2 positive. Radiation was completed 11/17/16.  She says axilla was targeted.  Is on anastrozole.  Broke hip at gym in February  and had a THA on the left side. COPD  HTN and on medication. Has had arm and leg lipomas with right ankle surgery x 2. Osteopenia. h/o right rotator cuff injury with physical therapy for that, full recovery.   Patient Stated Goals to get this swelling down and to feel normal again   Currently in Pain? Yes   Pain Score --  did not rate    Pain Location Axilla   Pain Orientation Right;Posterior   Pain Descriptors / Indicators Sharp   Pain Type Acute pain                         [facility] Adult PT Treatment/Exercise - 12/19/16 0001      Shoulder Exercises: Supine   Protraction Strengthening;Right;10 reps   Other Supine Exercises small circles with hand pointed to ceiling      Shoulder  Exercises: Standing   Extension AROM;Both  pain and cramping in posterior axilla with a few reps    Other Standing Exercises minisquats with scapular retraction x 10 reps      Manual Therapy   Manual Therapy Manual Lymphatic Drainage (MLD)   Manual therapy comments provided brief self instruction also    Manual Lymphatic Drainage (MLD) left axillary nodes, anterior interaxillary anastamosis, right upper chest and axilla, lateral chest toward inguinal nodes, right axilla and upper arm.  Pt not able to roll onto left side because of pain after hip surgery.    Passive ROM In Supine with HOB elevated due to COPD: To Rt shoulder into flexion, abduction, and er to pts tolerance                        Long Term Clinic Goals - 12/08/16 2111      CC Long Term Goal  #1   Title Pt. will be independent in performing manual lymph drainage for right flank area.   Time 4   Period Weeks   Status New   Target Date 01/14/17     CC Long Term Goal  #2   Title Pt. will be knowledgeable about appropriate compression garments (bra, T-shirt or vest options)   Time 4   Period Weeks   Status New   Target Date 01/14/17     CC Long Term Goal  #3   Title Pt. will report at least 60% decrease in right shoulder discomfort with moving her arm to end ROM.   Time 4   Period Weeks   Status New   Target Date 01/14/17     CC Long Term Goal  #4   Title Rt. shoulder flexion to at least 140 degrees for improved overhead reach.   Baseline 104 at eval compared to 160 on Lt.   Time 4   Period Weeks   Status New   Target Date 01/14/17     CC Long Term Goal  #5   Title Rt. shoulder abduction to at least 160 degrees for improved ADLs.   Baseline 135 at eval compared to 183 on left   Time 4   Period Weeks   Status New     CC Long Term Goal  #6   Title Pt. will be knowledgeable about lymphedema risk reduction.   Time 4   Period Weeks   Status New   Target Date 01/14/17     CC Long Term Goal   #7   Title Lymphedema Life Impact Scale score  reduced to <20% impairment   Baseline 28% impairment on eval   Time 4   Period Weeks   Status New   Target Date 01/14/17            Plan - 12/19/16 1212    Clinical Impression Statement Pt received relieft from MLD today and appeared to have less muscle spasm after the treatment.  She continues to have scapular winging in the left that was evident even with attempts at standing isometrics.    Rehab Potential Good   PT Frequency 2x / week   PT Duration 4 weeks   PT Treatment/Interventions ADLs/Self Care Home Management;Electrical Stimulation;DME Instruction;Therapeutic exercise;Patient/family education;Manual techniques;Manual lymph drainage;Passive range of motion;Taping   PT Next Visit Plan Cont P/AA/A/ROM to right shoulder, myofascial release to same with MLD to help with symptoms of swelling in breast, axilla and upper arm. HEP instruction for ROM; later, instruct in lymphedema risk reduction practices.  Include shoulder strengthening, especially for serratus as she has winging of right scapula.    Consulted and Agree with Plan of Care Patient      Patient will benefit from skilled therapeutic intervention in order to improve the following deficits and impairments:  Pain, Decreased range of motion, Impaired UE functional use, Increased edema, Decreased knowledge of use of DME, Decreased knowledge of precautions  Visit Diagnosis: Stiffness of right shoulder, not elsewhere classified  Right shoulder pain, unspecified chronicity  Localized edema  Muscle weakness (generalized)     Problem List Patient Active Problem List   Diagnosis Date Noted  . Breast cancer metastasized to axillary lymph node, right ([place]) 08/14/2016  . Carcinoma of upper-outer quadrant of right breast in female, estrogen receptor positive ([place]) 07/25/2016  . Hypothyroidism 05/09/2016  . Closed left femoral fracture ([place]) 05/08/2016  . COPD, group D, by  GOLD 2017 classification ([place]) 01/28/2011   Donato Heinz. Owens Shark PT  Norwood Levo 12/19/2016, 12:16 PM  [place], [place], [address] Phone: [phone]   Fax:  [phone]  Name: Linda [name] MRN: [id number] Date of Birth: 1946/03/25

## 2016-12-23 ENCOUNTER — Ambulatory Visit: Payer: PPO | Admitting: Physical Therapy

## 2016-12-24 ENCOUNTER — Ambulatory Visit: Payer: PPO

## 2016-12-24 DIAGNOSIS — M6281 Muscle weakness (generalized): Secondary | ICD-10-CM

## 2016-12-24 DIAGNOSIS — M25611 Stiffness of right shoulder, not elsewhere classified: Secondary | ICD-10-CM

## 2016-12-24 DIAGNOSIS — M25511 Pain in right shoulder: Secondary | ICD-10-CM

## 2016-12-24 DIAGNOSIS — R600 Localized edema: Secondary | ICD-10-CM

## 2016-12-24 NOTE — Therapy (Addendum)
[place], [place], [address] Phone: [phone]   Fax:  [phone]  Physical Therapy Treatment  Patient Details  Name: Linda [name] MRN: [id number] Date of Birth: [birth year] Referring Provider: Dr. Nicholas Lose  Encounter Date: 12/24/2016      PT End of Session - 12/24/16 1349    Visit Number 5   Number of Visits 9   Date for PT Re-Evaluation 01/16/17   PT Start Time 1301   PT Stop Time 1349   PT Time Calculation (min) 48 min   Activity Tolerance Patient tolerated treatment well   Behavior During Therapy [facility] for tasks assessed/performed      Past Medical History:  Diagnosis Date  . Arthritis    "qwhere; hands, feet, different body parts from time to time" (05/08/2016)  . Asthma   . Breast mass 07/03/2016  . Cancer [facility])    right breast  . Complication of anesthesia    "they used an adult tube going down my throat; caused alot of problems; was told to always have pediatric tube placed" (05/08/2016)  . COPD (chronic obstructive pulmonary disease) ([place])   . GERD (gastroesophageal reflux disease)   . History of hiatal hernia   . History of radiation therapy 10/07/16- 11/17/16   Right Breast and regional nodes 50 Gy in 25 fractions to breast and at least 45 Gy in 25 fractions to nodes, Right Breast boost 10 Gy in 5 fractions.   . Hyperglycemia   . Hyperlipidemia   . Hypertension   . Hypothyroidism   . Lump of breast, right    "suppose to have it checked soon" (05/08/2016)  . Migraine    "none for years" (05/08/2016)  . Osteopenia   . Pneumonia "several times"  . Status post radioactive iodine thyroid ablation   . Thyroid disease     Past Surgical History:  Procedure Laterality Date  . 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 APPENDECTOMY  1963  . BREAST LUMPECTOMY WITH AXILLARY LYMPH NODE DISSECTION Right 08/14/2016   Procedure: RIGHT BREAST LUMPECTOMY WITH  AXILLARY LYMPH NODE DISSECTION;  Surgeon: Stark Klein, MD;  Location: [place];  Service: General;  Laterality: Right;  . DILATION AND CURETTAGE OF UTERUS  "several"   "when I was young"  . INGUINAL HERNIA REPAIR Bilateral 2000  . MASS EXCISION Right 08/14/2016   Procedure: EXCISION RIGHT ARM MASS 2X3CM;  Surgeon: Stark Klein, MD;  Location: [place];  Service: General;  Laterality: Right;  . POLYPECTOMY     "vocal cord"  . TONSILLECTOMY    . TOTAL HIP ARTHROPLASTY Left 05/09/2016   Procedure: TOTAL HIP ARTHROPLASTY ANTERIOR APPROACH;  Surgeon: Paralee Cancel, MD;  Location: [place];  Service: Orthopedics;  Laterality: Left;  . TUBAL LIGATION    . VAGINAL HYSTERECTOMY  1979    There were no vitals filed for this visit.      Subjective Assessment - 12/24/16 1305    Subjective That spot at the back of my Rt axilla felt so much better after my last session.    Pertinent History Diagnosed with right breast cancer and the had lumpectomy 08/14/16 with 15 lymph nodes removed, 2 positive. Radiation was completed 11/17/16.  She says axilla was targeted.  Is on anastrozole.  Broke hip at gym in February and had a THA on the left side. COPD  HTN and on medication. Has had arm and leg  lipomas with right ankle surgery x 2. Osteopenia. h/o right rotator cuff injury with physical therapy for that, full recovery.   Patient Stated Goals to get this swelling down and to feel normal again   Currently in Pain? Yes   Pain Score 4    Pain Location Axilla   Pain Orientation Right;Posterior   Pain Descriptors / Indicators Sore   Pain Type Acute pain   Pain Onset More than a month ago   Aggravating Factors  reaching out to side too much   Pain Relieving Factors the stretch/massage really helped last time                         [facility] Adult PT Treatment/Exercise - 12/24/16 0001      Shoulder Exercises: Pulleys   Flexion 2 minutes   ABduction 2 minutes     Shoulder Exercises: Therapy Ball    Flexion Other (comment)  Tried a few but still hurt     Manual Therapy   Manual Therapy Manual Lymphatic Drainage (MLD);Passive ROM   Manual Lymphatic Drainage (MLD) left axillary nodes, anterior interaxillary anastamosis, right upper chest and axilla, lateral chest toward inguinal nodes, right axilla and upper arm.  Pt not able to roll onto left side because of pain after hip surgery.    Passive ROM In Supine with HOB elevated due to COPD: To Rt shoulder into flexion, abduction, and er to pts tolerance                        Long Term Clinic Goals - 12/08/16 2111      CC Long Term Goal  #1   Title Pt. will be independent in performing manual lymph drainage for right flank area.   Time 4   Period Weeks   Status New   Target Date 01/14/17     CC Long Term Goal  #2   Title Pt. will be knowledgeable about appropriate compression garments (bra, T-shirt or vest options)   Time 4   Period Weeks   Status New   Target Date 01/14/17     CC Long Term Goal  #3   Title Pt. will report at least 60% decrease in right shoulder discomfort with moving her arm to end ROM.   Time 4   Period Weeks   Status New   Target Date 01/14/17     CC Long Term Goal  #4   Title Rt. shoulder flexion to at least 140 degrees for improved overhead reach.   Baseline 104 at eval compared to 160 on Lt.   Time 4   Period Weeks   Status New   Target Date 01/14/17     CC Long Term Goal  #5   Title Rt. shoulder abduction to at least 160 degrees for improved ADLs.   Baseline 135 at eval compared to 183 on left   Time 4   Period Weeks   Status New     CC Long Term Goal  #6   Title Pt. will be knowledgeable about lymphedema risk reduction.   Time 4   Period Weeks   Status New   Target Date 01/14/17     CC Long Term Goal  #7   Title Lymphedema Life Impact Scale score reduced to <20% impairment   Baseline 28% impairment on eval   Time 4   Period Weeks   Status New   Target Date  01/14/17             Plan - 12/24/16 1400    Clinical Impression Statement Pt did visibly demonstrate increased P/ROM and tolerance to pressure at Rt posterior axilla throughout session. She still has pain with some stetches and still requires VC to not push into pain and avoid the stretches that increase pain.     Rehab Potential Good   PT Frequency 2x / week   PT Duration 4 weeks   PT Treatment/Interventions ADLs/Self Care Home Management;Electrical Stimulation;DME Instruction;Therapeutic exercise;Patient/family education;Manual techniques;Manual lymph drainage;Passive range of motion;Taping   PT Next Visit Plan Cont P/AA/A/ROM to right shoulder, myofascial release to same with MLD to help with symptoms of swelling in breast, axilla and upper arm. HEP instruction for ROM; later, instruct in lymphedema risk reduction practices.  Include shoulder strengthening, especially for serratus as she has winging of right scapula.    Consulted and Agree with Plan of Care Patient      Patient will benefit from skilled therapeutic intervention in order to improve the following deficits and impairments:  Pain, Decreased range of motion, Impaired UE functional use, Increased edema, Decreased knowledge of use of DME, Decreased knowledge of precautions  Visit Diagnosis: Stiffness of right shoulder, not elsewhere classified  Right shoulder pain, unspecified chronicity  Localized edema  Muscle weakness (generalized)     Problem List Patient Active Problem List   Diagnosis Date Noted  . Breast cancer metastasized to axillary lymph node, right ([place]) 08/14/2016  . Carcinoma of upper-outer quadrant of right breast in female, estrogen receptor positive ([place]) 07/25/2016  . Hypothyroidism 05/09/2016  . Closed left femoral fracture ([place]) 05/08/2016  . COPD, group D, by GOLD 2017 classification ([place]) 01/28/2011    Otelia Limes, PTA 12/24/2016, 5:16 PM  [place], [place], [address] Phone: [phone]   Fax:  [phone]  Name: Linda [name] MRN: [id number] Date of Birth: 11/24/1945

## 2016-12-26 ENCOUNTER — Ambulatory Visit
Admission: RE | Admit: 2016-12-26 | Discharge: 2016-12-26 | Disposition: A | Discharge status: Home / Self Care | Payer: PPO | Source: Ambulatory Visit | Attending: Radiation Oncology | Admitting: Radiation Oncology

## 2016-12-26 ENCOUNTER — Encounter: Payer: Self-pay | Admitting: Radiation Oncology

## 2016-12-26 DIAGNOSIS — C50411 Malignant neoplasm of upper-outer quadrant of right female breast: Secondary | ICD-10-CM

## 2016-12-26 DIAGNOSIS — Z510 Encounter for antineoplastic radiation therapy: Secondary | ICD-10-CM | POA: Diagnosis not present

## 2016-12-26 DIAGNOSIS — Z170 Estrogen receptor positive status [ER+]: Principal | ICD-10-CM

## 2016-12-26 HISTORY — DX: Personal history of irradiation: Z92.3

## 2016-12-26 NOTE — Progress Notes (Signed)
Linda [name] presents for follow up of radiation completed 11/17/16 to her Right Breast. She denies pain. She has occasional fatigue. Her Right Breast has healed well. She still has some tenderness and occasional itching. She did use Vitamin E oil after radiation completed but now is using her regular lotion. She is taking her anastrozole daily. She is seeing PT. She has a survivorship appointment on 02/24/17 and Dr. Lindi Adie 06/16/17.  BP 132/82   Pulse 84   Temp 97.9 F (36.6 C)   Ht 5\' 4"  (1.626 m)   Wt 120 lb (54.4 kg)   SpO2 100% Comment: room air  BMI 20.60 kg/m    Wt Readings from Last 3 Encounters:  12/26/16 120 lb (54.4 kg)  12/16/16 118 lb 12.8 oz (53.9 kg)  12/02/16 120 lb (54.4 kg)

## 2016-12-29 NOTE — Progress Notes (Signed)
Radiation Oncology         [phone] ________________________________  Name: Linda [name] MRN: [id number]  Date: 12/26/2016  DOB: 1945-05-27  Follow-Up Visit Note  Outpatient  CC: Ernestene Kiel, MD  Nicholas Lose, MD  Diagnosis and Prior Radiotherapy:    ICD-10-CM   1. Carcinoma of upper-outer quadrant of right breast in female, estrogen receptor positive ([place]) C50.411    Z17.0     CHIEF COMPLAINT: Here for follow-up and surveillance of breast cancer  Narrative:  The patient returns today for routine follow-up.  Doing well. No pain.  Used vit E oil over breast, now lotion. On anastrozole. Sees PT.                                ALLERGIES:  is allergic to aspirin.  Meds: Current Outpatient Prescriptions  Medication Sig Dispense Refill  . amLODipine (NORVASC) 5 MG tablet Take 5 mg by mouth daily before breakfast.     . anastrozole (ARIMIDEX) 1 MG tablet Take 1 tablet (1 mg total) by mouth daily. 90 tablet 3  . budesonide-formoterol (SYMBICORT) 160-4.5 MCG/ACT inhaler Inhale 2 puffs into the lungs 2 (two) times daily.    . Cholecalciferol (VITAMIN D3) 2000 units capsule Take 2,000 Units by mouth every 14 (fourteen) days. Once every 2-3 weeks (when she remembers)    . DALIRESP 500 MCG TABS tablet TAKE ONE TABLET BY MOUTH ONCE DAILY 30 tablet 5  . levothyroxine (SYNTHROID, LEVOTHROID) 88 MCG tablet Take 88 mcg by mouth daily before breakfast.     . losartan (COZAAR) 100 MG tablet Take 100 mg by mouth daily before breakfast.     . meloxicam (MOBIC) 15 MG tablet Take 15 mg by mouth daily before breakfast.     . omeprazole (PRILOSEC) 20 MG capsule Take 20 mg by mouth daily before breakfast.     . Omeprazole-Sodium Bicarbonate (ZEGERID OTC) 20-1100 MG CAPS capsule Take 1 capsule by mouth daily before breakfast.    . polyethylene glycol (MIRALAX / GLYCOLAX) packet Take 17 g by mouth 2 (two) times daily. 14 each 0  . pravastatin (PRAVACHOL) 20 MG tablet Take 20 mg by mouth  daily before breakfast.     . Probiotic Product ([place]) Take by mouth.    . SPIRIVA RESPIMAT 2.5 MCG/ACT AERS Inhale 2 puffs into the lungs daily after breakfast. 1 Inhaler 5  . traMADol (ULTRAM) 50 MG tablet 50 mg.     No current facility-administered medications for this encounter.     Physical Findings: The patient is in no acute distress. Patient is alert and oriented.  height is 5\' 4"  (1.626 m) and weight is 120 lb (54.4 kg). Her temperature is 97.9 F (36.6 C). Her blood pressure is 132/82 and her pulse is 84. Her oxygen saturation is 100%. .    Right breast with mild erythema and swelling.  Skin has healed well overall.   Lab Findings: Lab Results  Component Value Date   WBC 8.4 08/15/2016   HGB 12.4 08/15/2016   HCT 39.3 08/15/2016   MCV 91.4 08/15/2016   PLT 256 08/15/2016    Radiographic Findings: No results found.  Impression/Plan: healing well from RT.  I encouraged her to continue with yearly mammography and followup with medical oncology. I will see her back on an as-needed basis. I have encouraged her to call if she has any issues or concerns in  the future. I wished her the very best.    _____________________________________   Eppie Gibson, MD

## 2016-12-30 ENCOUNTER — Ambulatory Visit: Payer: PPO | Attending: Hematology and Oncology | Admitting: Physical Therapy

## 2016-12-30 DIAGNOSIS — R600 Localized edema: Secondary | ICD-10-CM | POA: Diagnosis not present

## 2016-12-30 DIAGNOSIS — M25511 Pain in right shoulder: Secondary | ICD-10-CM | POA: Insufficient documentation

## 2016-12-30 DIAGNOSIS — M25611 Stiffness of right shoulder, not elsewhere classified: Secondary | ICD-10-CM | POA: Insufficient documentation

## 2016-12-30 NOTE — Therapy (Signed)
[place], [place], [address] Phone: [phone]   Fax:  [phone]  Physical Therapy Treatment  Patient Details  Name: Linda [name] MRN: [id number] Date of Birth: 07-26-1945 Referring Provider: Dr. Nicholas Lose  Encounter Date: 12/30/2016      PT End of Session - 12/30/16 1740    Visit Number 6   Number of Visits 9   Date for PT Re-Evaluation 01/16/17   PT Start Time 1301   PT Stop Time 1350   PT Time Calculation (min) 49 min   Activity Tolerance Patient tolerated treatment well;Patient limited by pain   Behavior During Therapy [facility] for tasks assessed/performed      Past Medical History:  Diagnosis Date  . Arthritis    "qwhere; hands, feet, different body parts from time to time" (05/08/2016)  . Asthma   . Breast mass 07/03/2016  . Cancer [facility])    right breast  . Complication of anesthesia    "they used an adult tube going down my throat; caused alot of problems; was told to always have pediatric tube placed" (05/08/2016)  . COPD (chronic obstructive pulmonary disease) ([place])   . GERD (gastroesophageal reflux disease)   . History of hiatal hernia   . History of radiation therapy 10/07/16- 11/17/16   Right Breast and regional nodes 50 Gy in 25 fractions to breast and at least 45 Gy in 25 fractions to nodes, Right Breast boost 10 Gy in 5 fractions.   . Hyperglycemia   . Hyperlipidemia   . Hypertension   . Hypothyroidism   . Lump of breast, right    "suppose to have it checked soon" (05/08/2016)  . Migraine    "none for years" (05/08/2016)  . Osteopenia   . Pneumonia "several times"  . Status post radioactive iodine thyroid ablation   . Thyroid disease     Past Surgical History:  Procedure Laterality Date  . 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 APPENDECTOMY  1963  . BREAST LUMPECTOMY WITH AXILLARY LYMPH NODE DISSECTION Right 08/14/2016   Procedure: RIGHT  BREAST LUMPECTOMY WITH AXILLARY LYMPH NODE DISSECTION;  Surgeon: Stark Klein, MD;  Location: [place];  Service: General;  Laterality: Right;  . DILATION AND CURETTAGE OF UTERUS  "several"   "when I was young"  . INGUINAL HERNIA REPAIR Bilateral 2000  . MASS EXCISION Right 08/14/2016   Procedure: EXCISION RIGHT ARM MASS 2X3CM;  Surgeon: Stark Klein, MD;  Location: [place];  Service: General;  Laterality: Right;  . POLYPECTOMY     "vocal cord"  . TONSILLECTOMY    . TOTAL HIP ARTHROPLASTY Left 05/09/2016   Procedure: TOTAL HIP ARTHROPLASTY ANTERIOR APPROACH;  Surgeon: Paralee Cancel, MD;  Location: [place];  Service: Orthopedics;  Laterality: Left;  . TUBAL LIGATION    . VAGINAL HYSTERECTOMY  1979    There were no vitals filed for this visit.      Subjective Assessment - 12/30/16 1306    Subjective They released me from radiation for a year I think she said. Has brown spots on breast since radiation and she is going to have the surgeon look at that. Went to the dentist and had her jaw lock up when she opened her mouth and thinks it is from the anti-estrogen medication. Pt. does note that pain doesn't run down to her elbow in right upper arm any more.   Pertinent History Diagnosed with  right breast cancer and the had lumpectomy 08/14/16 with 15 lymph nodes removed, 2 positive. Radiation was completed 11/17/16.  She says axilla was targeted.  Is on anastrozole.  Broke hip at gym in February and had a THA on the left side. COPD  HTN and on medication. Has had arm and leg lipomas with right ankle surgery x 2. Osteopenia. h/o right rotator cuff injury with physical therapy for that, full recovery.   Currently in Pain? No/denies            [facility] PT Assessment - 12/30/16 0001      AROM   Right Shoulder Flexion 101 Degrees   Right Shoulder ABduction 108 Degrees  discomfort                     [facility] Adult PT Treatment/Exercise - 12/30/16 0001      Manual Therapy   Manual Therapy Soft  tissue mobilization;Other (comment)   Soft tissue mobilization to cording at right upper medial arm   Myofascial Release unilateral pulling at right upper outer chest toward axilla; crosshands technique from superior to axilla to right abdomen in vertical and slightly diagonal directions   Passive ROM In Supine with HOB elevated due to COPD: To Rt shoulder into flexion, abduction, and er to pts tolerance   Other Manual Therapy Soft tissue work to right periscapular area, limited by patient's tenderness.  She has tender or trigger points, and rotator cuff area is exquisitely tender                PT Education - 12/30/16 1740    Education provided Yes   Education Details lymphedema risk reduction handout given today   Person(s) Educated Patient   Methods Handout   Comprehension Need further instruction                Long Term Clinic Goals - 12/30/16 1309      CC Long Term Goal  #1   Title Pt. will be independent in performing manual lymph drainage for right flank area.   Status Partially Met     CC Long Term Goal  #2   Title Pt. will be knowledgeable about appropriate compression garments (bra, T-shirt or vest options)   Baseline Got an Amoena bra and it's just okay.   Status Partially Met     CC Long Term Goal  #3   Title Pt. will report at least 60% decrease in right shoulder discomfort with moving her arm to end ROM.   Status On-going     CC Long Term Goal  #4   Title Rt. shoulder flexion to at least 140 degrees for improved overhead reach.   Baseline 104 at eval compared to 160 on Lt.   Status On-going     CC Long Term Goal  #5   Title Rt. shoulder abduction to at least 160 degrees for improved ADLs.   Baseline 135 at eval compared to 183 on left   Status On-going     CC Long Term Goal  #6   Title Pt. will be knowledgeable about lymphedema risk reduction.   Status On-going     CC Long Term Goal  #7   Title Lymphedema Life Impact Scale score reduced to  <20% impairment   Status On-going            Plan - 12/30/16 1737    Clinical Impression Statement Goals checked today: pt. has made progress but has a  ways to go toward these.  She still has significant discomfort around right shoulder, as seen when soft tissue work was done in periscapular area.  She is extremely tender in area of rotator cuff muscles; she also reacts strongly to pressure on tender/trigger points in right upper trap and periscapular muscles.     Rehab Potential Good   PT Frequency 2x / week   PT Duration 4 weeks   PT Treatment/Interventions ADLs/Self Care Home Management;Electrical Stimulation;DME Instruction;Therapeutic exercise;Patient/family education;Manual techniques;Manual lymph drainage;Passive range of motion;Taping   PT Next Visit Plan Review lymphedema risk reduction (handout already given but not fully instructed); instruct in manual lymph drainage including giving handout; continue soft tissue work around right scapula to patient tolerance; continue P/Aa/A/ROM and strengthening to right shoulder   Consulted and Agree with Plan of Care Patient      Patient will benefit from skilled therapeutic intervention in order to improve the following deficits and impairments:  Pain, Decreased range of motion, Impaired UE functional use, Increased edema, Decreased knowledge of use of DME, Decreased knowledge of precautions  Visit Diagnosis: Stiffness of right shoulder, not elsewhere classified  Right shoulder pain, unspecified chronicity  Localized edema     Problem List Patient Active Problem List   Diagnosis Date Noted  . Breast cancer metastasized to axillary lymph node, right ([place]) 08/14/2016  . Carcinoma of upper-outer quadrant of right breast in female, estrogen receptor positive ([place]) 07/25/2016  . Hypothyroidism 05/09/2016  . Closed left femoral fracture ([place]) 05/08/2016  . COPD, group D, by GOLD 2017 classification ([place]) 01/28/2011     Machele Deihl 12/30/2016, 5:44 PM  [place], [place], [address] Phone: [phone]   Fax:  [phone]  Name: JAILANI HOGANS MRN: [id number] Date of Birth: 1946-01-19  Serafina Royals, PT 12/30/16 5:44 PM

## 2017-01-02 ENCOUNTER — Ambulatory Visit: Payer: PPO | Admitting: Physical Therapy

## 2017-01-02 ENCOUNTER — Encounter: Payer: Self-pay | Admitting: Physical Therapy

## 2017-01-02 DIAGNOSIS — M25611 Stiffness of right shoulder, not elsewhere classified: Secondary | ICD-10-CM

## 2017-01-02 DIAGNOSIS — R600 Localized edema: Secondary | ICD-10-CM

## 2017-01-02 DIAGNOSIS — M25511 Pain in right shoulder: Secondary | ICD-10-CM

## 2017-01-02 NOTE — Patient Instructions (Signed)
Self manual lymph drainage: Perform this sequence once a day.  Only give enough pressure no your skin to make the skin move.  Diaphragmatic - Supine   Inhale through nose making navel move out toward hands. Exhale through puckered lips, hands follow navel in. Repeat _5__ times. Rest _10__ seconds between repeats.   Copyright  VHI. All rights reserved.  Hug yourself.  Do circles at your neck just above your collarbones.  Repeat this 10 times.  Axilla - One at a Time   Using full weight of flat hand and fingers at center of uninvolved armpit, make _10__ in-place circles.   Copyright  VHI. All rights reserved.  LEG: Inguinal Nodes Stimulation   With small finger side of hand against hip crease on involved side, gently perform circles at the crease. Repeat __10_ times.   Copyright  VHI. All rights reserved.  1) Axilla to Inguinal Nodes - Sweep   On involved side, sweep _4__ times from armpit along side of trunk to hip crease.  Now gently stretch skin from the involved side to the uninvolved side across the chest at the shoulder line.  Repeat that 4 times.  Draw an imaginary diagonal line from upper outer breast through the nipple area toward lower inner breast.  Direct fluid upward and inward from this line toward the pathway across your upper chest .  Do this in three rows to treat all of the upper inner breast tissue, and do each row 3-4x.      Direct fluid to treat all of lower outer breast tissue downward and outward toward pathway that is aimed at the right groin.  Finish by doing the pathways as described above going from your involved armpit to the same side groin and going across your upper chest from the involved shoulder to the uninvolved shoulder.  Repeat the steps above where you do circles in your right groin and left armpit. Copyright  VHI. All rights reserved.  

## 2017-01-02 NOTE — Therapy (Signed)
[place], [place], [address] Phone: [phone]   Fax:  [phone]  Physical Therapy Treatment  Patient Details  Name: Linda [name] MRN: [id number] Date of Birth: 1945/09/08 Referring Provider: Dr. Nicholas Lose  Encounter Date: 01/02/2017      PT End of Session - 01/02/17 1153    Visit Number 7   Number of Visits 9   Date for PT Re-Evaluation 01/16/17   PT Start Time 1058   PT Stop Time 1145   PT Time Calculation (min) 47 min   Activity Tolerance Patient tolerated treatment well   Behavior During Therapy [facility] for tasks assessed/performed      Past Medical History:  Diagnosis Date  . Arthritis    "qwhere; hands, feet, different body parts from time to time" (05/08/2016)  . Asthma   . Breast mass 07/03/2016  . Cancer [facility])    right breast  . Complication of anesthesia    "they used an adult tube going down my throat; caused alot of problems; was told to always have pediatric tube placed" (05/08/2016)  . COPD (chronic obstructive pulmonary disease) ([place])   . GERD (gastroesophageal reflux disease)   . History of hiatal hernia   . History of radiation therapy 10/07/16- 11/17/16   Right Breast and regional nodes 50 Gy in 25 fractions to breast and at least 45 Gy in 25 fractions to nodes, Right Breast boost 10 Gy in 5 fractions.   . Hyperglycemia   . Hyperlipidemia   . Hypertension   . Hypothyroidism   . Lump of breast, right    "suppose to have it checked soon" (05/08/2016)  . Migraine    "none for years" (05/08/2016)  . Osteopenia   . Pneumonia "several times"  . Status post radioactive iodine thyroid ablation   . Thyroid disease     Past Surgical History:  Procedure Laterality Date  . 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 APPENDECTOMY  1963  . BREAST LUMPECTOMY WITH AXILLARY LYMPH NODE DISSECTION Right 08/14/2016   Procedure: RIGHT BREAST LUMPECTOMY WITH  AXILLARY LYMPH NODE DISSECTION;  Surgeon: Stark Klein, MD;  Location: [place];  Service: General;  Laterality: Right;  . DILATION AND CURETTAGE OF UTERUS  "several"   "when I was young"  . INGUINAL HERNIA REPAIR Bilateral 2000  . MASS EXCISION Right 08/14/2016   Procedure: EXCISION RIGHT ARM MASS 2X3CM;  Surgeon: Stark Klein, MD;  Location: [place];  Service: General;  Laterality: Right;  . POLYPECTOMY     "vocal cord"  . TONSILLECTOMY    . TOTAL HIP ARTHROPLASTY Left 05/09/2016   Procedure: TOTAL HIP ARTHROPLASTY ANTERIOR APPROACH;  Surgeon: Paralee Cancel, MD;  Location: [place];  Service: Orthopedics;  Laterality: Left;  . TUBAL LIGATION    . VAGINAL HYSTERECTOMY  1979    There were no vitals filed for this visit.      Subjective Assessment - 01/02/17 1100    Subjective I feel ok today. I am a little sore but it is better than it was.    Pertinent History Diagnosed with right breast cancer and the had lumpectomy 08/14/16 with 15 lymph nodes removed, 2 positive. Radiation was completed 11/17/16.  She says axilla was targeted.  Is on anastrozole.  Broke hip at gym in February and had a THA on the left side. COPD  HTN and on medication. Has had arm and leg lipomas  with right ankle surgery x 2. Osteopenia. h/o right rotator cuff injury with physical therapy for that, full recovery.   Patient Stated Goals to get this swelling down and to feel normal again   Currently in Pain? No/denies   Pain Score 0-No pain                         [facility] Adult PT Treatment/Exercise - 01/02/17 0001      Manual Therapy   Manual Lymphatic Drainage (MLD) instructed pt in the following and issued handout: 5 diaphragmatic breaths, short neck, left axillay nodes and establisment of interaxillary anastomosis, right inguinal nodes and establishment of axillo inguinal anastomosis, right breast moving fluid towards pathways, focus on swelling on right lateral trunk, reestablishment of pathways  pt required  moderate cues for correct technique   Other Manual Therapy Soft tissue work to right periscapular area in area of external rotators, pt not limited by pain, she did have tenderness, applied biofreeze to this area and pt felt relief                        Long Term Clinic Goals - 12/30/16 1309      CC Long Term Goal  #1   Title Pt. will be independent in performing manual lymph drainage for right flank area.   Status Partially Met     CC Long Term Goal  #2   Title Pt. will be knowledgeable about appropriate compression garments (bra, T-shirt or vest options)   Baseline Got an [place] bra and it's just okay.   Status Partially Met     CC Long Term Goal  #3   Title Pt. will report at least 60% decrease in right shoulder discomfort with moving her arm to end ROM.   Status On-going     CC Long Term Goal  #4   Title Rt. shoulder flexion to at least 140 degrees for improved overhead reach.   Baseline 104 at eval compared to 160 on Lt.   Status On-going     CC Long Term Goal  #5   Title Rt. shoulder abduction to at least 160 degrees for improved ADLs.   Baseline 135 at eval compared to 183 on left   Status On-going     CC Long Term Goal  #6   Title Pt. will be knowledgeable about lymphedema risk reduction.   Status On-going     CC Long Term Goal  #7   Title Lymphedema Life Impact Scale score reduced to <20% impairment   Status On-going            Plan - 01/02/17 1154    Clinical Impression Statement Educated pt in self MLD for right breast and right truncal swelling and issued handout to patient. She required moderate cueing for correct techinque , hand placement and speed. Continued soft tissue mobilization to R periscapular area with use of biofreeze. Pt was able to tolerate increased pressue today with this.    Rehab Potential Good   PT Frequency 2x / week   PT Duration 4 weeks   PT Treatment/Interventions ADLs/Self Care Home Management;Electrical  Stimulation;DME Instruction;Therapeutic exercise;Patient/family education;Manual techniques;Manual lymph drainage;Passive range of motion;Taping   PT Next Visit Plan Review lymphedema risk reduction (handout already given but not fully instructed);continue soft tissue work around right scapula to patient tolerance; continue P/Aa/A/ROM and strengthening to right shoulder   Consulted and Agree with Plan of  Care Patient      Patient will benefit from skilled therapeutic intervention in order to improve the following deficits and impairments:  Pain, Decreased range of motion, Impaired UE functional use, Increased edema, Decreased knowledge of use of DME, Decreased knowledge of precautions  Visit Diagnosis: Stiffness of right shoulder, not elsewhere classified  Right shoulder pain, unspecified chronicity  Localized edema     Problem List Patient Active Problem List   Diagnosis Date Noted  . Breast cancer metastasized to axillary lymph node, right ([employer]) 08/14/2016  . Carcinoma of upper-outer quadrant of right breast in female, estrogen receptor positive ([place]) 07/25/2016  . Hypothyroidism 05/09/2016  . Closed left femoral fracture ([place]) 05/08/2016  . COPD, group D, by GOLD 2017 classification ([place]) 01/28/2011    Allyson Sabal [facility] 01/02/2017, 11:57 AM  [place], [place], [address] Phone: [phone]   Fax:  [phone]  Name: Linda [name] MRN: [id number] Date of Birth: May 29, 1945  Manus Gunning, PT 01/02/17 11:58 AM

## 2017-01-15 DIAGNOSIS — E785 Hyperlipidemia, unspecified: Secondary | ICD-10-CM | POA: Diagnosis not present

## 2017-01-15 DIAGNOSIS — J449 Chronic obstructive pulmonary disease, unspecified: Secondary | ICD-10-CM | POA: Diagnosis not present

## 2017-01-15 DIAGNOSIS — E559 Vitamin D deficiency, unspecified: Secondary | ICD-10-CM | POA: Diagnosis not present

## 2017-01-15 DIAGNOSIS — E890 Postprocedural hypothyroidism: Secondary | ICD-10-CM | POA: Diagnosis not present

## 2017-01-15 DIAGNOSIS — Z79899 Other long term (current) drug therapy: Secondary | ICD-10-CM | POA: Diagnosis not present

## 2017-01-15 DIAGNOSIS — M62838 Other muscle spasm: Secondary | ICD-10-CM | POA: Diagnosis not present

## 2017-01-15 DIAGNOSIS — I10 Essential (primary) hypertension: Secondary | ICD-10-CM | POA: Diagnosis not present

## 2017-01-15 DIAGNOSIS — R7301 Impaired fasting glucose: Secondary | ICD-10-CM | POA: Diagnosis not present

## 2017-01-21 ENCOUNTER — Ambulatory Visit: Payer: PPO | Admitting: Physical Therapy

## 2017-01-21 DIAGNOSIS — M25511 Pain in right shoulder: Secondary | ICD-10-CM

## 2017-01-21 DIAGNOSIS — M25611 Stiffness of right shoulder, not elsewhere classified: Secondary | ICD-10-CM

## 2017-01-21 DIAGNOSIS — R600 Localized edema: Secondary | ICD-10-CM

## 2017-01-21 NOTE — Therapy (Addendum)
[place], [place], [address] Phone: [phone]   Fax:  [phone]  Physical Therapy Treatment  Patient Details  Name: Linda [name] MRN: [id number] Date of Birth: 10/10/1945 Referring Provider: Dr. Nicholas Lose  Encounter Date: 01/21/2017      PT End of Session - 01/21/17 1606    Visit Number 8   Number of Visits 9   PT Start Time 4034   PT Stop Time 1600   PT Time Calculation (min) 43 min   Activity Tolerance Patient tolerated treatment well   Behavior During Therapy [facility] for tasks assessed/performed      Past Medical History:  Diagnosis Date  . Arthritis    "qwhere; hands, feet, different body parts from time to time" (05/08/2016)  . Asthma   . Breast mass 07/03/2016  . Cancer [facility])    right breast  . Complication of anesthesia    "they used an adult tube going down my throat; caused alot of problems; was told to always have pediatric tube placed" (05/08/2016)  . COPD (chronic obstructive pulmonary disease) ([place])   . GERD (gastroesophageal reflux disease)   . History of hiatal hernia   . History of radiation therapy 10/07/16- 11/17/16   Right Breast and regional nodes 50 Gy in 25 fractions to breast and at least 45 Gy in 25 fractions to nodes, Right Breast boost 10 Gy in 5 fractions.   . Hyperglycemia   . Hyperlipidemia   . Hypertension   . Hypothyroidism   . Lump of breast, right    "suppose to have it checked soon" (05/08/2016)  . Migraine    "none for years" (05/08/2016)  . Osteopenia   . Pneumonia "several times"  . Status post radioactive iodine thyroid ablation   . Thyroid disease     Past Surgical History:  Procedure Laterality Date  . 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 APPENDECTOMY  1963  . BREAST LUMPECTOMY WITH AXILLARY LYMPH NODE DISSECTION Right 08/14/2016   Procedure: RIGHT BREAST LUMPECTOMY WITH AXILLARY LYMPH NODE DISSECTION;  Surgeon:  Stark Klein, MD;  Location: [place];  Service: General;  Laterality: Right;  . DILATION AND CURETTAGE OF UTERUS  "several"   "when I was young"  . INGUINAL HERNIA REPAIR Bilateral 2000  . MASS EXCISION Right 08/14/2016   Procedure: EXCISION RIGHT ARM MASS 2X3CM;  Surgeon: Stark Klein, MD;  Location: [place];  Service: General;  Laterality: Right;  . POLYPECTOMY     "vocal cord"  . TONSILLECTOMY    . TOTAL HIP ARTHROPLASTY Left 05/09/2016   Procedure: TOTAL HIP ARTHROPLASTY ANTERIOR APPROACH;  Surgeon: Paralee Cancel, MD;  Location: [place];  Service: Orthopedics;  Laterality: Left;  . TUBAL LIGATION    . VAGINAL HYSTERECTOMY  1979    There were no vitals filed for this visit.      Subjective Assessment - 01/21/17 1519    Subjective "I think that estrogen blocker is giving me a fit. I have aches and pains." She thinks she feels clear about doing the manual lymph drainage.  Still feels the heaviness under her arm and a certain amount of pain.   Pertinent History Diagnosed with right breast cancer and the had lumpectomy 08/14/16 with 15 lymph nodes removed, 2 positive. Radiation was completed 11/17/16.  She says axilla was targeted.  Is on anastrozole.  Broke hip at gym in February and had a THA  on the left side. COPD  HTN and on medication. Has had arm and leg lipomas with right ankle surgery x 2. Osteopenia. h/o right rotator cuff injury with physical therapy for that, full recovery.   Currently in Pain? No/denies            [facility] PT Assessment - 01/21/17 0001      Observation/Other Assessments   Other Surveys  --  lymphedema life impact scale score of 11 = 16% impairment     AROM   Right Shoulder Flexion 125 Degrees   Right Shoulder ABduction 133 Degrees                     [facility] Adult PT Treatment/Exercise - 01/21/17 0001      Self-Care   Other Self-Care Comments  answered patient's questions about how long to continue  manual lymph drainage (as needed) and other  expectations for the future, including doing her gym exercises, which she was encouraged to continue to progress gradually and without aggravating pain     Manual Therapy   Manual Lymphatic Drainage (MLD) reviewed with patient the following, with patient performing it:  5 diaphragmatic breaths, short neck, left axillay nodes and establisment of interaxillary anastomosis, right inguinal nodes and establishment of axillo inguinal anastomosis, right breast moving fluid towards pathways, focus on swelling on right lateral trunk, reestablishment of pathways  she did require correction of some mistakes she was making                        Long Term Clinic Goals - 01/21/17 1521      CC Long Term Goal  #1   Title Pt. will be independent in performing manual lymph drainage for right flank area.   Status Achieved     CC Long Term Goal  #2   Title Pt. will be knowledgeable about appropriate compression garments (bra, T-shirt or vest options)   Status Achieved     CC Long Term Goal  #3   Title Pt. will report at least 60% decrease in right shoulder discomfort with moving her arm to end ROM.   Baseline at least 80% better   Status Achieved     CC Long Term Goal  #4   Title Rt. shoulder flexion to at least 140 degrees for improved overhead reach.   Baseline 104 at eval compared to 160 on Lt.; 125 on 01/21/17   Status Partially Met     CC Long Term Goal  #5   Title Rt. shoulder abduction to at least 160 degrees for improved ADLs.   Baseline 135 at eval compared to 183 on left; 133 on 01/21/17   Status Not Met     CC Long Term Goal  #6   Title Pt. will be knowledgeable about lymphedema risk reduction.   Status Achieved     CC Long Term Goal  #7   Title Lymphedema Life Impact Scale score reduced to <20% impairment   Baseline 28% impairment on eval; 16% at DC   Status Achieved            Plan - 01/21/17 1606    Clinical Impression Statement Reviewed all goals, most of  which patieint has met.  She did not meet right shoulder A/ROM goals, but feels that may be because of a longstanding rotator cuff problem there.  She needed review today of self-manual lymph drainage, and required correction of some techniques. She  had questions about expectations for self-care going forward, and these were answered. She was encouraged to see an orthopedist about her continuing right shoulder issues.   Rehab Potential Good   PT Treatment/Interventions ADLs/Self Care Home Management;Electrical Stimulation;DME Instruction;Therapeutic exercise;Patient/family education;Manual techniques;Manual lymph drainage;Passive range of motion;Taping   PT Next Visit Plan None (unless she is re-referred for shoulder issues in the future); discharge today   PT Home Exercise Plan self-manual lymph drainage; progressive exercise without pain   Consulted and Agree with Plan of Care Patient      Patient will benefit from skilled therapeutic intervention in order to improve the following deficits and impairments:  Pain, Decreased range of motion, Impaired UE functional use, Increased edema, Decreased knowledge of use of DME, Decreased knowledge of precautions  Visit Diagnosis: Stiffness of right shoulder, not elsewhere classified  Right shoulder pain, unspecified chronicity  Localized edema       G-Codes - January 28, 2017 1610    Functional Assessment Tool Used (Outpatient Only) Lymphedema life impact scale   Functional Limitation Self care   Self Care Goal Status ([id number]) At least 1 percent but less than 20 percent impaired, limited or restricted   Self Care Discharge Status [phone]) At least 1 percent but less than 20 percent impaired, limited or restricted      Problem List Patient Active Problem List   Diagnosis Date Noted  . Breast cancer metastasized to axillary lymph node, right ([place]) 08/14/2016  . Carcinoma of upper-outer quadrant of right breast in female, estrogen receptor positive ([place])  07/25/2016  . Hypothyroidism 05/09/2016  . Closed left femoral fracture ([place]) 05/08/2016  . COPD, group D, by GOLD 2017 classification ([place]) 01/28/2011    Juron Vorhees 2017/01/28, 4:16 PM  [place], [place], [address] Phone: [phone]   Fax:  [phone]  Name: Linda [name] MRN: [id number] Date of Birth: Jan 25, 1946  Serafina Royals, PT 2017-01-28 4:16 PM

## 2017-02-13 ENCOUNTER — Telehealth: Payer: Self-pay

## 2017-02-13 NOTE — Telephone Encounter (Signed)
Left message for pt to remind of [facility] visit appt on 11/27 @ 10 am.

## 2017-02-20 ENCOUNTER — Other Ambulatory Visit: Payer: Self-pay | Admitting: Pulmonary Disease

## 2017-02-24 ENCOUNTER — Telehealth: Payer: Self-pay | Admitting: Pulmonary Disease

## 2017-02-24 ENCOUNTER — Telehealth: Payer: Self-pay | Admitting: Adult Health

## 2017-02-24 ENCOUNTER — Ambulatory Visit (HOSPITAL_BASED_OUTPATIENT_CLINIC_OR_DEPARTMENT_OTHER): Payer: PPO | Admitting: Adult Health

## 2017-02-24 VITALS — BP 169/90 | HR 81 | Temp 97.5°F | Resp 16 | Ht 64.0 in | Wt 118.6 lb

## 2017-02-24 DIAGNOSIS — C50411 Malignant neoplasm of upper-outer quadrant of right female breast: Secondary | ICD-10-CM

## 2017-02-24 DIAGNOSIS — M8580 Other specified disorders of bone density and structure, unspecified site: Secondary | ICD-10-CM | POA: Diagnosis not present

## 2017-02-24 DIAGNOSIS — Z170 Estrogen receptor positive status [ER+]: Secondary | ICD-10-CM | POA: Diagnosis not present

## 2017-02-24 DIAGNOSIS — C773 Secondary and unspecified malignant neoplasm of axilla and upper limb lymph nodes: Secondary | ICD-10-CM | POA: Diagnosis not present

## 2017-02-24 DIAGNOSIS — Z79811 Long term (current) use of aromatase inhibitors: Secondary | ICD-10-CM

## 2017-02-24 NOTE — Progress Notes (Signed)
CLINIC:  Survivorship   REASON FOR VISIT:  Routine follow-up post-treatment for a recent history of breast cancer.  BRIEF ONCOLOGIC HISTORY:    Carcinoma of upper-outer quadrant of right breast in female, estrogen receptor positive ([place])   07/03/2016 Initial Diagnosis    Right breast biopsy 10:30 position: IDC, lymphovascular invasion present, right axillary lymph node positive, grade 3, ER 90%, PR 60%, Ki-67 12%, HER-2 negative ratio 0.41; right breast mass 9 cm from nipple and 30 position: 1.7 x 1 x 1.6 cm, 2 abnormal lymph nodes right axilla largest 1.9 cm smaller 1.2 cm; T1c N1 stage II a      08/14/2016 Surgery    Rt Lumpectomy: IDC grade 2, 1.8 cm, 2/15 LN, ER 90%, PR 60%, Ki-67 12%, HER-2 negative ratio 0.41; T1CN1 (Stage 2A)      08/18/2016 Miscellaneous    Mammaprint high risk luminal B: Patient refused chemotherapy. 5 year risk of recurrence 20%, 10 year risk of recurrence 29%; patient refused chemotherapy and radiation      09/01/2016 -  Anti-estrogen oral therapy    Anastrozole 1 mg daily       INTERVAL HISTORY:  Linda [name] presents to the [facility] today for our initial meeting to review her survivorship care plan detailing her treatment course for breast cancer, as well as monitoring long-term side effects of that treatment, education regarding health maintenance, screening, and overall wellness and health promotion.     Overall, Linda [name] reports feeling moderately well.  She is having significant bone pain.  This pain is intermittent and typically occurs at night when it is time to sleep.  This pain preceded her diagnosis with breast cancer, however is significantly worse since starting the Anastrozole.    She is also experiencing difficulty sleeping and is only getting about 4-5 hours of sleep per night.  2-3 nights per week she will not get any sleep.  This started when she started the Anastrozole.    Breast lymphedema, referred to PT by Dr. Barry Dienes in  10/2016.  She went to phsyical therapy and her breast lymphedema improved, and she was released from PT.  She still does note some swelling and pain in her right axilla and lateral breast.  She does continue to do exercises as recommended by PT, the massage technique, in addition to exercising with a compression sleeve.    REVIEW OF SYSTEMS:  Review of Systems  Constitutional: Negative for appetite change, chills, fatigue, fever and unexpected weight change.  HENT:   Negative for hearing loss and lump/mass.   Eyes: Negative for eye problems and icterus.  Respiratory: Negative for chest tightness, cough and shortness of breath.   Cardiovascular: Negative for chest pain, leg swelling and palpitations.  Gastrointestinal: Negative for abdominal distention, abdominal pain, constipation, diarrhea, nausea and vomiting.  Endocrine: Negative for hot flashes.  Genitourinary: Negative for difficulty urinating.   Musculoskeletal: Positive for arthralgias.  Skin: Negative for itching and rash.  Neurological: Negative for dizziness, extremity weakness and numbness.  Hematological: Negative for adenopathy. Does not bruise/bleed easily.  Psychiatric/Behavioral: Positive for sleep disturbance.  Breast: Denies any new nodularity, masses, tenderness, nipple changes, or nipple discharge.      ONCOLOGY TREATMENT TEAM:  1. Surgeon:  Dr. Barry Dienes at [facility] 2. Medical Oncologist: Dr. Lindi Adie    PAST MEDICAL/SURGICAL HISTORY:  Past Medical History:  Diagnosis Date  . Arthritis    "qwhere; hands, feet, different body parts from time to time" (05/08/2016)  .  Asthma   . Breast mass 07/03/2016  . Cancer [facility])    right breast  . Complication of anesthesia    "they used an adult tube going down my throat; caused alot of problems; was told to always have pediatric tube placed" (05/08/2016)  . COPD (chronic obstructive pulmonary disease) ([place])   . GERD (gastroesophageal reflux disease)   . History  of hiatal hernia   . History of radiation therapy 10/07/16- 11/17/16   Right Breast and regional nodes 50 Gy in 25 fractions to breast and at least 45 Gy in 25 fractions to nodes, Right Breast boost 10 Gy in 5 fractions.   . Hyperglycemia   . Hyperlipidemia   . Hypertension   . Hypothyroidism   . Lump of breast, right    "suppose to have it checked soon" (05/08/2016)  . Migraine    "none for years" (05/08/2016)  . Osteopenia   . Pneumonia "several times"  . Status post radioactive iodine thyroid ablation   . Thyroid disease    Past Surgical History:  Procedure Laterality Date  . 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 APPENDECTOMY  1963  . BREAST LUMPECTOMY WITH AXILLARY LYMPH NODE DISSECTION Right 08/14/2016   Procedure: RIGHT BREAST LUMPECTOMY WITH AXILLARY LYMPH NODE DISSECTION;  Surgeon: Stark Klein, MD;  Location: [place];  Service: General;  Laterality: Right;  . DILATION AND CURETTAGE OF UTERUS  "several"   "when I was young"  . INGUINAL HERNIA REPAIR Bilateral 2000  . MASS EXCISION Right 08/14/2016   Procedure: EXCISION RIGHT ARM MASS 2X3CM;  Surgeon: Stark Klein, MD;  Location: [place];  Service: General;  Laterality: Right;  . POLYPECTOMY     "vocal cord"  . TONSILLECTOMY    . TOTAL HIP ARTHROPLASTY Left 05/09/2016   Procedure: TOTAL HIP ARTHROPLASTY ANTERIOR APPROACH;  Surgeon: Paralee Cancel, MD;  Location: [place];  Service: Orthopedics;  Laterality: Left;  . TUBAL LIGATION    . VAGINAL HYSTERECTOMY  1979     ALLERGIES:  Allergies  Allergen Reactions  . Aspirin     ulcer     CURRENT MEDICATIONS:  Outpatient Encounter Medications as of 02/24/2017  Medication Sig  . amLODipine (NORVASC) 5 MG tablet Take 5 mg by mouth daily before breakfast.   . anastrozole (ARIMIDEX) 1 MG tablet Take 1 tablet (1 mg total) by mouth daily.  . budesonide-formoterol (SYMBICORT) 160-4.5 MCG/ACT inhaler Inhale 2 puffs into the lungs 2 (two) times daily.  .  Cholecalciferol (VITAMIN D3) 2000 units capsule Take 2,000 Units by mouth every 14 (fourteen) days. Once every 2-3 weeks (when she remembers)  . DALIRESP 500 MCG TABS tablet TAKE 1 TABLET BY MOUTH ONCE DAILY  . DULERA 200-5 MCG/ACT AERO   . levothyroxine (SYNTHROID, LEVOTHROID) 88 MCG tablet Take 88 mcg by mouth daily before breakfast.   . losartan (COZAAR) 100 MG tablet Take 100 mg by mouth daily before breakfast.   . meloxicam (MOBIC) 15 MG tablet Take 15 mg by mouth daily before breakfast.   . nystatin (MYCOSTATIN) 100000 UNIT/ML suspension   . omeprazole (PRILOSEC) 20 MG capsule Take 20 mg by mouth daily before breakfast.   . Omeprazole-Sodium Bicarbonate (ZEGERID OTC) 20-1100 MG CAPS capsule Take 1 capsule by mouth daily before breakfast.  . polyethylene glycol (MIRALAX / GLYCOLAX) packet Take 17 g by mouth 2 (two) times daily.  . pravastatin (PRAVACHOL) 20 MG tablet Take 20 mg by mouth daily  before breakfast.   . PROAIR HFA 108 (90 Base) MCG/ACT inhaler   . Probiotic Product ([place]) Take by mouth.  . SPIRIVA RESPIMAT 2.5 MCG/ACT AERS Inhale 2 puffs into the lungs daily after breakfast.  . [DISCONTINUED] traMADol (ULTRAM) 50 MG tablet 50 mg.   No facility-administered encounter medications on file as of 02/24/2017.      ONCOLOGIC FAMILY HISTORY:  Family History  Problem Relation Age of Onset  . Hypertension Father   . Hypertension Mother   . Stroke Mother   . Heart attack Mother   . Allergies Mother      GENETIC COUNSELING/TESTING: Not at this time  SOCIAL HISTORY:  Linda [name] is divorced and lives alone in [place], [place].  She has 3 children and they live in [place] as well.  Linda [name] is currently retired.  She denies any current or history of tobacco, alcohol, or illicit drug use.     PHYSICAL EXAMINATION:  Vital Signs:   Vitals:   02/24/17 1001  BP: (!) 169/90  Pulse: 81  Resp: 16  Temp: (!) 97.5 F (36.4 C)  SpO2: 98%    Filed Weights   02/24/17 1001  Weight: 118 lb 9.6 oz (53.8 kg)   General: Well-nourished, well-appearing female in no acute distress.  She is unaccompanied today.   HEENT: Head is normocephalic.  Pupils equal and reactive to light. Conjunctivae clear without exudate.  Sclerae anicteric. Oral mucosa is pink, moist.  Oropharynx is pink without lesions or erythema.  Lymph: No cervical, supraclavicular, or infraclavicular lymphadenopathy noted on palpation.  Cardiovascular: Regular rate and rhythm.Marland Kitchen Respiratory: Clear to auscultation bilaterally. Chest expansion symmetric; breathing non-labored.  GI: Abdomen soft and round; non-tender, non-distended. Bowel sounds normoactive.  GU: Deferred.  Neuro: No focal deficits. Steady gait.  Psych: Mood and affect normal and appropriate for situation.  Extremities: No edema. MSK: No focal spinal tenderness to palpation.  Full range of motion in bilateral upper extremities Skin: Warm and dry.  LABORATORY DATA:  None for this visit.  DIAGNOSTIC IMAGING:  None for this visit.      ASSESSMENT AND PLAN:  Ms.. [name] is a pleasant 71 y.o. female with Stage IA right breast invasive ductal carcinoma, ER+/PR+/HER2-, diagnosed in 06/2016, treated with lumpectomy, and anti-estrogen therapy with Anastrozole beginning in 08/2016.  She presents to the Survivorship Clinic for our initial meeting and routine follow-up post-completion of treatment for breast cancer.    1. Stage IA right breast cancer:  Linda [name] is continuing to recover from definitive treatment for breast cancer. She will follow-up with her medical oncologist, Dr. Lindi Adie in March, 2019 with history and physical exam per surveillance protocol.  She will continue her anti-estrogen therapy with Anastrozole. Thus far, she is tolerating the Anastrozole  Moderately well, see #2 and #3. She was instructed to make Dr. Lindi Adie or myself aware if she begins to experience any worsening side effects of the  medication and I could see her back in clinic to help manage those side effects, as needed. Today, a comprehensive survivorship care plan and treatment summary was reviewed with the patient today detailing her breast cancer diagnosis, treatment course, potential late/long-term effects of treatment, appropriate follow-up care with recommendations for the future, and patient education resources.  A copy of this summary, along with a letter will be sent to the patient's primary care provider via mail/fax/In Basket message after today's visit.    2. Breast lymphedema: This is improving with  physical therapy, she will continue this.    3. Difficulty sleeping: I am concerned that this is worse with the Anastrozole.  She and I discussed that she really needed to be getting more sleep and ways to perhaps accomplish this.  She is going to try taking Tylenol PM to try to help her sleep.  If this doesn't work, then she will consider taking a vacation from the Anastrozole for a couple of weeks to see if that helps.    4. Bone pain: This precedes her cancer diagnosis, and her PCP recommended some further work up with neurology.  I recommended she follow their advice.  I am concerned that the Anastrozole may have made it worse, but we will not know unless she takes a 2 week holiday from the Anastrozole.  She does know that if she feels better off of the Anastrozole, that we can always change her to Letrozole or Tamoxifen if needed.    5. Bone health:  Given Linda [name] age/history of breast cancer and her current treatment regimen including anti-estrogen therapy with Anastrozole, she is at risk for bone demineralization.  Her last DEXA scan was 09/15/2016, which showed osteopenia with a T score of -2.3 in the right femur, she was encouraged to increase her consumption of foods rich in calcium, as well as increase her weight-bearing activities.  She was given education on specific activities to promote bone health.  6.  Cancer screening:  Due to Linda [name]'s history and her age, she should receive screening for skin cancers, colon cancer, and gynecologic cancers.  The information and recommendations are listed on the patient's comprehensive care plan/treatment summary and were reviewed in detail with the patient.    7. Health maintenance and wellness promotion: Linda [name] was encouraged to consume 5-7 servings of fruits and vegetables per day. We reviewed the "Nutrition Rainbow" handout, as well as the handout "Take Control of Your Health and Reduce Your Cancer Risk" from the [facility].  She was also encouraged to engage in moderate to vigorous exercise for 30 minutes per day most days of the week. We discussed the [employer] fitness program, which is designed for cancer survivors to help them become more physically fit after cancer treatments.  She was instructed to limit her alcohol consumption and continue to abstain from tobacco use.     8. Support services/counseling: It is not uncommon for this period of the patient's cancer care trajectory to be one of many emotions and stressors.  We discussed an opportunity for her to participate in the next session of [facility] ("Finding Your New Normal") support group series designed for patients after they have completed treatment.   Linda [name] was encouraged to take advantage of our many other support services programs, support groups, and/or counseling in coping with her new life as a cancer survivor after completing anti-cancer treatment.  She was offered support today through active listening and expressive supportive counseling.  She was given information regarding our available services and encouraged to contact me with any questions or for help enrolling in any of our support group/programs.    Dispo:   -Return to cancer center for follow up with Dr. Lindi Adie in 05/2017 -Mammogram due in 06/2017 -Bone density is due 08/2018 -Follow up with Dr. Barry Dienes in  02/2017 -She is welcome to return back to the Survivorship Clinic at any time; no additional follow-up needed at this time.  -Consider referral back to survivorship as a long-term survivor for  continued surveillance  A total of (30) minutes of face-to-face time was spent with this patient with greater than 50% of that time in counseling and care-coordination.   Gardenia Phlegm, NP Survivorship Program [facility] [phone]   Note: PRIMARY CARE PROVIDER Ernestene Kiel, [place] [phone]

## 2017-02-24 NOTE — Telephone Encounter (Signed)
Yes. It is Ok to get the shingles vaccine. Thanks

## 2017-02-24 NOTE — Telephone Encounter (Signed)
Pt wanted to make sure that it was ok for her to get the shingles vaccine at [facility] today since she has COPD.  PM please advise. Thanks

## 2017-02-24 NOTE — Telephone Encounter (Signed)
No 11/27 los. °

## 2017-02-24 NOTE — Telephone Encounter (Signed)
Called and spoke with pt and she is aware of PM recs.  Nothing further is needed.

## 2017-02-25 ENCOUNTER — Encounter: Payer: Self-pay | Admitting: Adult Health

## 2017-05-05 DIAGNOSIS — Z170 Estrogen receptor positive status [ER+]: Secondary | ICD-10-CM | POA: Diagnosis not present

## 2017-05-05 DIAGNOSIS — I890 Lymphedema, not elsewhere classified: Secondary | ICD-10-CM | POA: Diagnosis not present

## 2017-05-05 DIAGNOSIS — C50411 Malignant neoplasm of upper-outer quadrant of right female breast: Secondary | ICD-10-CM | POA: Diagnosis not present

## 2017-05-24 ENCOUNTER — Other Ambulatory Visit: Payer: Self-pay | Admitting: Pulmonary Disease

## 2017-06-16 ENCOUNTER — Other Ambulatory Visit: Payer: PPO

## 2017-06-16 ENCOUNTER — Inpatient Hospital Stay: Payer: PPO | Attending: Hematology and Oncology | Admitting: Hematology and Oncology

## 2017-06-16 ENCOUNTER — Telehealth: Payer: Self-pay | Admitting: Hematology and Oncology

## 2017-06-16 DIAGNOSIS — Z79811 Long term (current) use of aromatase inhibitors: Secondary | ICD-10-CM | POA: Insufficient documentation

## 2017-06-16 DIAGNOSIS — C50411 Malignant neoplasm of upper-outer quadrant of right female breast: Secondary | ICD-10-CM | POA: Diagnosis not present

## 2017-06-16 DIAGNOSIS — M8580 Other specified disorders of bone density and structure, unspecified site: Secondary | ICD-10-CM | POA: Diagnosis not present

## 2017-06-16 DIAGNOSIS — Z170 Estrogen receptor positive status [ER+]: Secondary | ICD-10-CM | POA: Insufficient documentation

## 2017-06-16 MED ORDER — LETROZOLE 2.5 MG PO TABS: 2.5000 mg | ORAL_TABLET | Freq: Every day | ORAL | 0 refills | Status: DC

## 2017-06-16 NOTE — Assessment & Plan Note (Signed)
Rt Lumpectomy 08/14/16: IDC grade 2, 1.8 cm, 2/15 LN, ER 90%, PR 60%, Ki-67 12%, HER-2 negative ratio 0.41; T1CN1 (Stage 2A)  Mammaprint: High Risk: Risk of recurrence 20% at 5 years and 29% of 10 years. Chemotherapy would most likely decrease the risk of recurrence at 5 years to 5.4%. (Patient refused chemotherapy and radiation)  Current treatment: Anastrozole 1 mg by mouth daily restarted 09/01/2016  Anastrozole toxicities: Denies any hot flashes or myalgias.  Breast cancer surveillance: Annual mammograms and periodic breast exams Bone density 09/15/2016: T score -2.3: Osteopenia I recommended that the patient take bisphosphonate therapy along with calcium and vitamin D  Return to clinic in 1 year for follow-up

## 2017-06-16 NOTE — Telephone Encounter (Signed)
Gave patient AVs and calendar of upcoming September appointments.  °

## 2017-06-16 NOTE — Progress Notes (Signed)
Patient Care Team: Ernestene Kiel, MD as PCP - General (Internal Medicine) Collene Gobble, MD (Pulmonary Disease) Stark Klein, MD as Consulting Physician (Surgical Oncology) Eppie Gibson, MD as Attending Physician (Radiation Oncology) Nicholas Lose, MD as Consulting Physician (Hematology and Oncology) Delice Bison Charlestine Massed, NP as Nurse Practitioner (Hematology and Oncology)  DIAGNOSIS:  Encounter Diagnosis  Name Primary?  . Carcinoma of upper-outer quadrant of right breast in female, estrogen receptor positive ([place])     SUMMARY OF ONCOLOGIC HISTORY:   Carcinoma of upper-outer quadrant of right breast in female, estrogen receptor positive ([place])   07/03/2016 Initial Diagnosis    Right breast biopsy 10:30 position: IDC, lymphovascular invasion present, right axillary lymph node positive, grade 3, ER 90%, PR 60%, Ki-67 12%, HER-2 negative ratio 0.41; right breast mass 9 cm from nipple and 30 position: 1.7 x 1 x 1.6 cm, 2 abnormal lymph nodes right axilla largest 1.9 cm smaller 1.2 cm; T1c N1 stage II a      08/14/2016 Surgery    Rt Lumpectomy: IDC grade 2, 1.8 cm, 2/15 LN, ER 90%, PR 60%, Ki-67 12%, HER-2 negative ratio 0.41; T1CN1 (Stage 2A)      08/18/2016 Miscellaneous    Mammaprint high risk luminal B: Patient refused chemotherapy. 5 year risk of recurrence 20%, 10 year risk of recurrence 29%; patient refused chemotherapy and radiation      09/01/2016 -  Anti-estrogen oral therapy    Anastrozole 1 mg daily switched to letrozole 06/16/2017 due to arthralgias and myalgias       CHIEF COMPLIANT: Follow-up on anastrozole therapy  INTERVAL HISTORY: Linda [name] is a 72 year old with above-mentioned his right breast cancer underwent lumpectomy and was high risk by Mammaprint but she decided not to do chemotherapy or radiation.  She is currently on oral antiestrogen therapy with anastrozole.  She comes in today complaining of diffuse arthralgias and myalgias.  She denies any  lumps or nodules in the breast.  REVIEW OF SYSTEMS:   Constitutional: Denies fevers, chills or abnormal weight loss Eyes: Denies blurriness of vision Ears, nose, mouth, throat, and face: Denies mucositis or sore throat Respiratory: Denies cough, dyspnea or wheezes Cardiovascular: Denies palpitation, chest discomfort Gastrointestinal:  Denies nausea, heartburn or change in bowel habits Skin: Denies abnormal skin rashes Lymphatics: Denies new lymphadenopathy or easy bruising Neurological:Denies numbness, tingling or new weaknesses Behavioral/Psych: Mood is stable, no new changes  Extremities: No lower extremity edema Breast:  denies any pain or lumps or nodules in either breasts All other systems were reviewed with the patient and are negative.  I have reviewed the past medical history, past surgical history, social history and family history with the patient and they are unchanged from previous note.  ALLERGIES:  is allergic to aspirin.  MEDICATIONS:  Current Outpatient Medications  Medication Sig Dispense Refill  . amLODipine (NORVASC) 5 MG tablet Take 5 mg by mouth daily before breakfast.     . budesonide-formoterol (SYMBICORT) 160-4.5 MCG/ACT inhaler Inhale 2 puffs into the lungs 2 (two) times daily.    . Cholecalciferol (VITAMIN D3) 2000 units capsule Take 2,000 Units by mouth every 14 (fourteen) days. Once every 2-3 weeks (when she remembers)    . DALIRESP 500 MCG TABS tablet TAKE 1 TABLET BY MOUTH ONCE DAILY 30 tablet 5  . DULERA 200-5 MCG/ACT AERO     . letrozole (FEMARA) 2.5 MG tablet Take 1 tablet (2.5 mg total) by mouth daily. 30 tablet 0  . levothyroxine (SYNTHROID, LEVOTHROID) 88  MCG tablet Take 88 mcg by mouth daily before breakfast.     . losartan (COZAAR) 100 MG tablet Take 100 mg by mouth daily before breakfast.     . meloxicam (MOBIC) 15 MG tablet Take 15 mg by mouth daily before breakfast.     . nystatin (MYCOSTATIN) 100000 UNIT/ML suspension     . omeprazole  (PRILOSEC) 20 MG capsule Take 20 mg by mouth daily before breakfast.     . Omeprazole-Sodium Bicarbonate (ZEGERID OTC) 20-1100 MG CAPS capsule Take 1 capsule by mouth daily before breakfast.    . polyethylene glycol (MIRALAX / GLYCOLAX) packet Take 17 g by mouth 2 (two) times daily. 14 each 0  . pravastatin (PRAVACHOL) 20 MG tablet Take 20 mg by mouth daily before breakfast.     . PROAIR HFA 108 (90 Base) MCG/ACT inhaler     . Probiotic Product ([place]) Take by mouth.    . SPIRIVA RESPIMAT 2.5 MCG/ACT AERS INHALE 2 PUFFS INTO THE LUNGS DAILY AFTER  BREAKFAST 1 Inhaler 5   No current facility-administered medications for this visit.     PHYSICAL EXAMINATION: ECOG PERFORMANCE STATUS: 1 - Symptomatic but completely ambulatory  Vitals:   06/16/17 1451  BP: (!) 162/84  Pulse: 93  Resp: 18  Temp: 97.7 F (36.5 C)  SpO2: 98%   Filed Weights   06/16/17 1451  Weight: 122 lb 3.2 oz (55.4 kg)    GENERAL:alert, no distress and comfortable SKIN: skin color, texture, turgor are normal, no rashes or significant lesions EYES: normal, Conjunctiva are pink and non-injected, sclera clear OROPHARYNX:no exudate, no erythema and lips, buccal mucosa, and tongue normal  NECK: supple, thyroid normal size, non-tender, without nodularity LYMPH:  no palpable lymphadenopathy in the cervical, axillary or inguinal LUNGS: clear to auscultation and percussion with normal breathing effort HEART: regular rate & rhythm and no murmurs and no lower extremity edema ABDOMEN:abdomen soft, non-tender and normal bowel sounds MUSCULOSKELETAL:no cyanosis of digits and no clubbing  NEURO: alert & oriented x 3 with fluent speech, no focal motor/sensory deficits EXTREMITIES: No lower extremity edema  LABORATORY DATA:  I have reviewed the data as listed CMP Latest Ref Rng & Units 08/15/2016 08/14/2016 08/07/2016  Glucose 65 - 99 mg/dL 97 - 121(H)  BUN 6 - 20 mg/dL 15 - 17  Creatinine 0.44 - 1.00 mg/dL  1.11(H) 0.76 0.97  Sodium 135 - 145 mmol/L 135 - 138  Potassium 3.5 - 5.1 mmol/L 4.3 - 3.7  Chloride 101 - 111 mmol/L 102 - 103  CO2 22 - 32 mmol/L 27 - 25  Calcium 8.9 - 10.3 mg/dL 8.9 - 9.3  Total Protein 6.5 - 8.1 g/dL - - -  Total Bilirubin 0.3 - 1.2 mg/dL - - -  Alkaline Phos 38 - 126 U/L - - -  AST 15 - 41 U/L - - -  ALT 14 - 54 U/L - - -    Lab Results  Component Value Date   WBC 8.4 08/15/2016   HGB 12.4 08/15/2016   HCT 39.3 08/15/2016   MCV 91.4 08/15/2016   PLT 256 08/15/2016   NEUTROABS 14.9 (H) 05/08/2016    ASSESSMENT & PLAN:  Carcinoma of upper-outer quadrant of right breast in female, estrogen receptor positive ([place]) Rt Lumpectomy 08/14/16: IDC grade 2, 1.8 cm, 2/15 LN, ER 90%, PR 60%, Ki-67 12%, HER-2 negative ratio 0.41; T1CN1 (Stage 2A)  Mammaprint: High Risk: Risk of recurrence 20% at 5 years and 29% of  10 years. Chemotherapy would most likely decrease the risk of recurrence at 5 years to 5.4%. (Patient refused chemotherapy and radiation)  Current treatment: Anastrozole 1 mg by mouth daily restarted 09/01/2016  Anastrozole toxicities: Denies any hot flashes or myalgias.  Breast cancer surveillance: Annual mammograms and periodic breast exams Bone density 09/15/2016: T score -2.3: Osteopenia I recommended that the patient take bisphosphonate therapy along with calcium and vitamin D  Return to clinic in 1 year for follow-up     I spent 25 minutes talking to the patient of which more than half was spent in counseling and coordination of care.  Orders Placed This Encounter  Procedures  . Ambulatory referral to Physical Therapy    Referral Priority:   Routine    Referral Type:   Physical Medicine    Referral Reason:   Specialty Services Required    Requested Specialty:   Physical Therapy    Number of Visits Requested:   1   The patient has a good understanding of the overall plan. she agrees with it. she will call with any problems that may  develop before the next visit here.   Harriette Ohara, MD 06/16/17

## 2017-06-24 ENCOUNTER — Other Ambulatory Visit: Payer: Self-pay

## 2017-06-24 ENCOUNTER — Ambulatory Visit: Payer: PPO | Attending: Hematology and Oncology | Admitting: Physical Therapy

## 2017-06-24 DIAGNOSIS — M25511 Pain in right shoulder: Secondary | ICD-10-CM | POA: Diagnosis not present

## 2017-06-24 DIAGNOSIS — M25611 Stiffness of right shoulder, not elsewhere classified: Secondary | ICD-10-CM | POA: Diagnosis not present

## 2017-06-24 NOTE — Patient Instructions (Signed)
BACK: Child's Pose (Sciatica)    Sit in knee-chest position and reach arms forward. Separate knees for comfort. Hold position for 30 seconds. Repeat _1-2__ times. Do _2__ times per day.  You can also walk your hands toward the left while in this position, stretching your right side by doing that.   Copyright  VHI. All rights reserved.  Thoracolumbar Side-Bend: Single Arm (Standing)    Reach over head to other side with right arm until stretch is felt. Hold __30__ seconds. Relax. Repeat __1-2__ times per set. Do __1__ sets per session. Do __1-2__ sessions per day.  http://orth.exer.us/263   Copyright  VHI. All rights reserved.  Lower Trunk Rotation    Bring both knees in to chest. Rotate from side to side, keeping knees together and feet off floor. Hold for 30 seconds Repeat __1-2__ times per set. Do _1___ sets per session. Do _1-2___ sessions per day.  http://orth.exer.us/151   Copyright  VHI. All rights reserved.

## 2017-06-24 NOTE — Therapy (Signed)
[place], [place], [address] Phone: [phone]   Fax:  [phone]  Physical Therapy Evaluation  Patient Details  Name: Linda [name] MRN: [id number] Date of Birth: 01-26-1946 Referring Provider: Dr. Lindi Adie   Encounter Date: 06/24/2017  PT End of Session - 06/24/17 1719    Visit Number  1    Number of Visits  2    Date for PT Re-Evaluation  08/12/17    PT Start Time  4193    PT Stop Time  1517    PT Time Calculation (min)  43 min    Activity Tolerance  Patient tolerated treatment well    Behavior During Therapy  [facility] for tasks assessed/performed       Past Medical History:  Diagnosis Date  . Arthritis    "qwhere; hands, feet, different body parts from time to time" (05/08/2016)  . Asthma   . Breast mass 07/03/2016  . Cancer [facility])    right breast  . Complication of anesthesia    "they used an adult tube going down my throat; caused alot of problems; was told to always have pediatric tube placed" (05/08/2016)  . COPD (chronic obstructive pulmonary disease) ([place])   . GERD (gastroesophageal reflux disease)   . History of hiatal hernia   . History of radiation therapy 10/07/16- 11/17/16   Right Breast and regional nodes 50 Gy in 25 fractions to breast and at least 45 Gy in 25 fractions to nodes, Right Breast boost 10 Gy in 5 fractions.   . Hyperglycemia   . Hyperlipidemia   . Hypertension   . Hypothyroidism   . Lump of breast, right    "suppose to have it checked soon" (05/08/2016)  . Migraine    "none for years" (05/08/2016)  . Osteopenia   . Pneumonia "several times"  . Status post radioactive iodine thyroid ablation   . Thyroid disease     Past Surgical History:  Procedure Laterality Date  . 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 APPENDECTOMY  1963  . BREAST LUMPECTOMY WITH AXILLARY LYMPH NODE DISSECTION Right 08/14/2016   Procedure: RIGHT BREAST LUMPECTOMY  WITH AXILLARY LYMPH NODE DISSECTION;  Surgeon: Stark Klein, MD;  Location: [facility];  Service: General;  Laterality: Right;  . DILATION AND CURETTAGE OF UTERUS  "several"   "when I was young"  . INGUINAL HERNIA REPAIR Bilateral 2000  . MASS EXCISION Right 08/14/2016   Procedure: EXCISION RIGHT ARM MASS 2X3CM;  Surgeon: Stark Klein, MD;  Location: [facility];  Service: General;  Laterality: Right;  . POLYPECTOMY     "vocal cord"  . TONSILLECTOMY    . TOTAL HIP ARTHROPLASTY Left 05/09/2016   Procedure: TOTAL HIP ARTHROPLASTY ANTERIOR APPROACH;  Surgeon: Paralee Cancel, MD;  Location: [facility];  Service: Orthopedics;  Laterality: Left;  . TUBAL LIGATION    . VAGINAL HYSTERECTOMY  1979    There were no vitals filed for this visit.   Subjective Assessment - 06/24/17 1438    Subjective  "I don't think I've been out of pain for a year. I do the manual lymph drainage once or twice a day. He's taken me off my estrogen pill for two weeks and he's going to put me on another one." I hurt my arm at the gym and he thought I should go back to PT to learn what machines I should not be using. Didn't get the  Flexitouch because I couldn't afford it--my insurance was not in network.    Pertinent History  Diagnosed with right breast cancer and the had lumpectomy 08/14/16 with 15 lymph nodes removed, 2 positive. Radiation was completed 11/17/16.  She says axilla was targeted.  Is on anastrozole.  Broke hip at gym in February and had a THA on the left side. COPD  HTN and on medication. Has had arm and leg lipomas with right ankle surgery x 2. Osteopenia. h/o right rotator cuff injury with physical therapy for that, full recovery.    Patient Stated Goals  find out what machines to use at the gym and which not to    Currently in Pain?  Yes    Pain Score  5     Pain Location  Arm axilla and back    Pain Orientation  Right;Upper    Pain Descriptors / Indicators  -- stinging and pain    Aggravating Factors   at random    Pain  Relieving Factors  manual lymph drainage         [facility] PT Assessment - 06/24/17 0001      Assessment   Medical Diagnosis  right breast cancer s/p lumpectomy    Referring Provider  Dr. Lindi Adie    Hand Dominance  Right      Balance Screen   Has the patient fallen in the past 6 months  No    Has the patient had a decrease in activity level because of a fear of falling?   No    Is the patient reluctant to leave their home because of a fear of falling?   No      Prior Function   Level of Independence  Independent    Vocation  Retired    Leisure  works out at [employer] 2-3 days/week doing strengthening with UEs, LEs and core; 20 minutes on the bike      Cognition   Overall Cognitive Status  Within Functional Limits for tasks assessed      ROM / Strength   AROM / PROM / Strength  AROM      AROM   Overall AROM Comments  right shoulder mildly limited compared to left, and she grimaces with pain at right axilla area when she gets to end range says she doesn't do much stretching now              No data recorded  Objective measurements completed on examination: See above findings.              PT Education - 06/24/17 1717    Education provided  Yes    Education Details  About exercise at the gym: advised to avoid overhead press, to avoid extremes of shoulder ROM and to keep resistance at levels that she can control well; suggested stretching after her cardio workout on the bike and before strengthening as well as after strengthening; suggested she try yoga with modifications for activities that she doesn't tolerate; about yoga for cancer survivors at [facility].    Person(s) Educated  Patient    Methods  Explanation;Handout    Comprehension  Verbalized understanding          PT Long Term Goals - 06/24/17 1726      PT LONG TERM GOAL #1   Title  Pt. will report at least 25% decrease in right axilla/upper flank/shoulder area pain.    Time  4    Period  Weeks  Status  New      PT LONG TERM GOAL #2   Title  Pt. will be knowledgeable about safe exercise for her at the gym.    Time  4    Period  Weeks    Status  Partially Met             Plan - 06/24/17 1720    Clinical Impression Statement  Pt. who was seen in this clinic last fall returns to [place] today requesting advice about her gym workouts.  She reports ongoing pain in right axilla and flank areas; this pain wakes her at night and interferes with her sleep.  She has not seen an orthopedist about shoulder pain as we had talked about when she was coming for treatment some months ago. She did not get a Flexitouch pump because it was too expensive for her. I was able to make suggestions today that included suggestions about stretching.  She has not been doing much stretching and reports that it consistently sets off muscle spasms when she does it.  We agreed that she would try what was suggested today for a month and then return for a follow-up to see how that worked out.    Clinical Presentation  Stable    Clinical Decision Making  Low    Rehab Potential  Good    Clinical Impairments Affecting Rehab Potential  COPD    PT Frequency  [place]    PT Treatment/Interventions  ADLs/Self Care Home Management;Therapeutic exercise;Patient/family education;Manual techniques;Passive range of motion    PT Next Visit Plan  See if she has tolerated stretching and whether that provided any benefit to her or continued to cause spasms.  Check on whether her pain has decreased at all. See if she tried yoga and how she tolerated that.    PT Home Exercise Plan  stretching, strengthening at gym, yoga    Recommended Other Services  yoga for cancer survivors at [facility]    Consulted and Agree with Plan of Care  Patient       Patient will benefit from skilled therapeutic intervention in order to improve the following deficits and impairments:  Pain, Decreased range of motion  Visit Diagnosis: Stiffness of right  shoulder, not elsewhere classified - Plan: PT plan of care cert/re-cert  Right shoulder pain, unspecified chronicity - Plan: PT plan of care cert/re-cert     Problem List Patient Active Problem List   Diagnosis Date Noted  . Breast cancer metastasized to axillary lymph node, right ([place]) 08/14/2016  . Carcinoma of upper-outer quadrant of right breast in female, estrogen receptor positive ([place]) 07/25/2016  . Hypothyroidism 05/09/2016  . Closed left femoral fracture ([place]) 05/08/2016  . COPD, group D, by GOLD 2017 classification ([place]) 01/28/2011    Dandra Velardi 06/24/2017, 5:28 PM  [place], [place], [address] Phone: [phone]   Fax:  [phone]  Name: Linda [name] MRN: [id number] Date of Birth: 1946/03/26  Serafina Royals, PT 06/24/17 5:28 PM

## 2017-07-06 ENCOUNTER — Ambulatory Visit: Payer: PPO | Admitting: Pulmonary Disease

## 2017-07-06 ENCOUNTER — Encounter: Payer: Self-pay | Admitting: Pulmonary Disease

## 2017-07-06 VITALS — BP 122/70 | HR 95 | Ht 64.0 in | Wt 119.8 lb

## 2017-07-06 DIAGNOSIS — J449 Chronic obstructive pulmonary disease, unspecified: Secondary | ICD-10-CM

## 2017-07-06 DIAGNOSIS — F17210 Nicotine dependence, cigarettes, uncomplicated: Secondary | ICD-10-CM | POA: Diagnosis not present

## 2017-07-06 MED ORDER — FLUTICASONE-UMECLIDIN-VILANT 100-62.5-25 MCG/INH IN AEPB: 1.0000 | INHALATION_SPRAY | Freq: Every day | RESPIRATORY_TRACT | 0 refills | Status: DC

## 2017-07-06 NOTE — Patient Instructions (Signed)
We will try you on trelegy inhaler instead of Symbicort and Spiriva Continue with albuterol as needed Please work on quitting smoking Follow-up in 6 months.

## 2017-07-06 NOTE — Progress Notes (Signed)
Linda [name]    [id number]    December 09, 1945  Primary Care Physician:Prochnau, Chrys Racer, MD  Referring Physician: Ernestene Kiel, MD [place]. [place], [place] [address]  Chief complaint:   Follow up for  COPD GOLD D  HPI: Linda [name] is a 72 year old with COPD (GOLD D, CAT score 25, multiple exacerbation over past year, FEV1 41%). She was seen by Dr. Lamonte Sakai in 2012. She has not followed up in our office since then. She has been maintained on Spiriva and Symbicort. His symptoms have been stable until this year when she has several exacerbations over the winter requiring courses of antibiotics and steroids. Her primary care physician has referred her back to [place] for further management. Her chief complaint is dyspnea on exertion, loss of energy, fatigue,  chronic cough with sputum production.   She's had a diagnosis of breast cancer in April 2018 and underwent lumpectomy and XRT. She just finished her last radiation treatment recently She declined chemotherapy and radiation  Interim History: Complains of diffuse bone pain, breast pain.  She has started smoking again up to 1/2 pack/day She continues on Symbicort, Spiriva and daliresp.  Complains of worsening dyspnea on exertion, cough with clear mucus   Outpatient Encounter Medications as of 07/06/2017  Medication Sig  . amLODipine (NORVASC) 5 MG tablet Take 5 mg by mouth daily before breakfast.   . budesonide-formoterol (SYMBICORT) 160-4.5 MCG/ACT inhaler Inhale 2 puffs into the lungs 2 (two) times daily.  . Cholecalciferol (VITAMIN D3) 2000 units capsule Take 2,000 Units by mouth every 14 (fourteen) days. Once every 2-3 weeks (when she remembers)  . DALIRESP 500 MCG TABS tablet TAKE 1 TABLET BY MOUTH ONCE DAILY  . DULERA 200-5 MCG/ACT AERO   . letrozole (FEMARA) 2.5 MG tablet Take 1 tablet (2.5 mg total) by mouth daily.  Marland Kitchen levothyroxine (SYNTHROID, LEVOTHROID) 88 MCG tablet Take 88 mcg by mouth daily before breakfast.   .  losartan (COZAAR) 100 MG tablet Take 100 mg by mouth daily before breakfast.   . meloxicam (MOBIC) 15 MG tablet Take 15 mg by mouth daily before breakfast.   . nystatin (MYCOSTATIN) 100000 UNIT/ML suspension   . omeprazole (PRILOSEC) 20 MG capsule Take 20 mg by mouth daily before breakfast.   . Omeprazole-Sodium Bicarbonate (ZEGERID OTC) 20-1100 MG CAPS capsule Take 1 capsule by mouth daily before breakfast.  . polyethylene glycol (MIRALAX / GLYCOLAX) packet Take 17 g by mouth 2 (two) times daily.  . pravastatin (PRAVACHOL) 20 MG tablet Take 20 mg by mouth daily before breakfast.   . PROAIR HFA 108 (90 Base) MCG/ACT inhaler   . Probiotic Product ([place]) Take by mouth.  . SPIRIVA RESPIMAT 2.5 MCG/ACT AERS INHALE 2 PUFFS INTO THE LUNGS DAILY AFTER  BREAKFAST   No facility-administered encounter medications on file as of 07/06/2017.     Allergies as of 07/06/2017 - Review Complete 07/06/2017  Allergen Reaction Noted  . Aspirin  01/27/2011    Past Medical History:  Diagnosis Date  . Arthritis    "qwhere; hands, feet, different body parts from time to time" (05/08/2016)  . Asthma   . Breast mass 07/03/2016  . Cancer [facility])    right breast  . Complication of anesthesia    "they used an adult tube going down my throat; caused alot of problems; was told to always have pediatric tube placed" (05/08/2016)  . COPD (chronic obstructive pulmonary disease) ([place])   .  GERD (gastroesophageal reflux disease)   . History of hiatal hernia   . History of radiation therapy 10/07/16- 11/17/16   Right Breast and regional nodes 50 Gy in 25 fractions to breast and at least 45 Gy in 25 fractions to nodes, Right Breast boost 10 Gy in 5 fractions.   . Hyperglycemia   . Hyperlipidemia   . Hypertension   . Hypothyroidism   . Lump of breast, right    "suppose to have it checked soon" (05/08/2016)  . Migraine    "none for years" (05/08/2016)  . Osteopenia   . Pneumonia "several times"  . Status  post radioactive iodine thyroid ablation   . Thyroid disease     Past Surgical History:  Procedure Laterality Date  . 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 APPENDECTOMY  1963  . BREAST LUMPECTOMY WITH AXILLARY LYMPH NODE DISSECTION Right 08/14/2016   Procedure: RIGHT BREAST LUMPECTOMY WITH AXILLARY LYMPH NODE DISSECTION;  Surgeon: Stark Klein, MD;  Location: [place];  Service: General;  Laterality: Right;  . DILATION AND CURETTAGE OF UTERUS  "several"   "when I was young"  . INGUINAL HERNIA REPAIR Bilateral 2000  . MASS EXCISION Right 08/14/2016   Procedure: EXCISION RIGHT ARM MASS 2X3CM;  Surgeon: Stark Klein, MD;  Location: [place];  Service: General;  Laterality: Right;  . POLYPECTOMY     "vocal cord"  . TONSILLECTOMY    . TOTAL HIP ARTHROPLASTY Left 05/09/2016   Procedure: TOTAL HIP ARTHROPLASTY ANTERIOR APPROACH;  Surgeon: Paralee Cancel, MD;  Location: [place];  Service: Orthopedics;  Laterality: Left;  . TUBAL LIGATION    . VAGINAL HYSTERECTOMY  1979    Family History  Problem Relation Age of Onset  . Hypertension Father   . Hypertension Mother   . Stroke Mother   . Heart attack Mother   . Allergies Mother     Social History   Socioeconomic History  . Marital status: Divorced    Spouse name: Not on file  . Number of children: 3  . Years of education: Not on file  . Highest education level: Not on file  Occupational History  . Occupation: retired  Scientific laboratory technician  . Financial resource strain: Not on file  . Food insecurity:    Worry: Not on file    Inability: Not on file  . Transportation needs:    Medical: Not on file    Non-medical: Not on file  Tobacco Use  . Smoking status: Current Every Day Smoker    Packs/day: 0.25    Years: 25.00    Pack years: 6.25    Types: Cigarettes    Last attempt to quit: 03/31/2006    Years since quitting: 11.2  . Smokeless tobacco: Never Used  Substance and Sexual Activity  . Alcohol use: Yes     Alcohol/week: 0.0 oz    Comment: 05/08/2016 "I'll have a drink on special occasions; usually wine"  . Drug use: No  . Sexual activity: Never  Lifestyle  . Physical activity:    Days per week: Not on file    Minutes per session: Not on file  . Stress: Not on file  Relationships  . Social connections:    Talks on phone: Not on file    Gets together: Not on file    Attends religious service: Not on file    Active member of club or organization: Not on file  Attends meetings of clubs or organizations: Not on file    Relationship status: Not on file  . Intimate partner violence:    Fear of current or ex partner: Not on file    Emotionally abused: Not on file    Physically abused: Not on file    Forced sexual activity: Not on file  Other Topics Concern  . Not on file  Social History Narrative  . Not on file    Review of systems: Review of Systems  Constitutional: Negative for fever and chills.  HENT: Negative.   Eyes: Negative for blurred vision.  Respiratory: as per HPI  Cardiovascular: Negative for chest pain and palpitations.  Gastrointestinal: Negative for vomiting, diarrhea, blood per rectum. Genitourinary: Negative for dysuria, urgency, frequency and hematuria.  Musculoskeletal: Negative for myalgias, back pain and joint pain.  Skin: Negative for itching and rash.  Neurological: Negative for dizziness, tremors, focal weakness, seizures and loss of consciousness.  Endo/Heme/Allergies: Negative for environmental allergies.  Psychiatric/Behavioral: Negative for depression, suicidal ideas and hallucinations.  All other systems reviewed and are negative.  Physical Exam: Blood pressure 122/70, pulse 95, height 5\' 4"  (1.626 m), weight 119 lb 12.8 oz (54.3 kg), SpO2 96 %. Gen:      No acute distress HEENT:  EOMI, sclera anicteric Neck:     No masses; no thyromegaly Lungs:    Clear to auscultation bilaterally; normal respiratory effort CV:         Regular rate and rhythm; no  murmurs Abd:      + bowel sounds; soft, non-tender; no palpable masses, no distension Ext:    No edema; adequate peripheral perfusion Skin:      Warm and dry; no rash Neuro: alert and oriented x 3 Psych: normal mood and affect  Data Reviewed: PFTs  02/18/11 FVC 2.42 [117%), FEV1 1.46 (69%), F/F 46, TLC 112%, DLCO 69% Moderate obstructive defect, mild reduction in diffusion capacity.  08/23/15 FVC 2.14 [71%], FEV1 0.94 [41%], F/F 44, TLC 114%, DLCO 57% Severe obstructive defect with moderate reduction in diffusion capacity  CT chest 02/26/15- mild left lower lobe atelectasis, moderate emphysematous changes.  CT chest 08/01/16- moderate emphysematous changes.  No suspicious lung nodules or mass.  Chronic scarring in the right lower lobe. I have reviewed the images personally  Assessment:  Severe COPD. GOLD stage D Continue on Symbicort and Spiriva but has worsening symptoms over the past few months.  We will switch her to trelegy inhaler .Check O2 sats on exertion today.  Offered pulmonary rehabilitation but she is on already on an exercise program at home and goes to gym 3 times/week.  Last CT scan in may 2018 does not show any suspicious pulmonary nodules or mass.  Active smoker Unfortunately she started smoking again Encouraged her to quit smoking.  She says that Chantix and Spiriva does not work and wants to quit on her own Time spent counseling-5 minutes  Health maintenance Pneumovax-up-to-date at primary office. 12/16/16- Flu vaccine  Plan/Recommendations: - Continue daliresp.  Start trelegy instead of Symbicort, Spiriva - Smoking cessation  Marshell Garfinkel MD [facility] Pager [phone] 07/06/2017, 3:41 PM  CC: Ernestene Kiel, MD

## 2017-07-06 NOTE — Addendum Note (Signed)
Addended by: Maryanna Shape A on: 07/06/2017 04:31 PM   Modules accepted: Orders

## 2017-07-07 ENCOUNTER — Encounter: Payer: Self-pay | Admitting: Hematology and Oncology

## 2017-07-16 DIAGNOSIS — E890 Postprocedural hypothyroidism: Secondary | ICD-10-CM | POA: Diagnosis not present

## 2017-07-16 DIAGNOSIS — E785 Hyperlipidemia, unspecified: Secondary | ICD-10-CM | POA: Diagnosis not present

## 2017-07-16 DIAGNOSIS — R7301 Impaired fasting glucose: Secondary | ICD-10-CM | POA: Diagnosis not present

## 2017-07-16 DIAGNOSIS — F5101 Primary insomnia: Secondary | ICD-10-CM | POA: Diagnosis not present

## 2017-07-16 DIAGNOSIS — Z79899 Other long term (current) drug therapy: Secondary | ICD-10-CM | POA: Diagnosis not present

## 2017-07-16 DIAGNOSIS — J449 Chronic obstructive pulmonary disease, unspecified: Secondary | ICD-10-CM | POA: Diagnosis not present

## 2017-07-24 ENCOUNTER — Telehealth: Payer: Self-pay | Admitting: Pulmonary Disease

## 2017-07-24 MED ORDER — FLUTICASONE-UMECLIDIN-VILANT 100-62.5-25 MCG/INH IN AEPB: 1.0000 | INHALATION_SPRAY | Freq: Every day | RESPIRATORY_TRACT | 5 refills | Status: DC

## 2017-07-24 NOTE — Telephone Encounter (Signed)
Spoke with pt and advised rx sent to pharmacy. Nothing further is needed.   

## 2017-07-27 ENCOUNTER — Encounter: Payer: Self-pay | Admitting: Hematology and Oncology

## 2017-07-29 ENCOUNTER — Other Ambulatory Visit: Payer: Self-pay | Admitting: Hematology and Oncology

## 2017-08-03 ENCOUNTER — Ambulatory Visit: Payer: PPO | Attending: Hematology and Oncology | Admitting: Physical Therapy

## 2017-08-03 DIAGNOSIS — M25611 Stiffness of right shoulder, not elsewhere classified: Secondary | ICD-10-CM | POA: Diagnosis not present

## 2017-08-03 DIAGNOSIS — R600 Localized edema: Secondary | ICD-10-CM

## 2017-08-03 DIAGNOSIS — M25511 Pain in right shoulder: Secondary | ICD-10-CM

## 2017-08-03 NOTE — Therapy (Signed)
[place], [place], [address] Phone: [phone]   Fax:  [phone]  Physical Therapy Treatment  Patient Details  Name: RHILYN BATTLE MRN: [id number] Date of Birth: 1945-06-02 Referring Provider: Dr. Lindi Adie   Encounter Date: 08/03/2017  PT End of Session - 08/03/17 1558    Visit Number  2    Number of Visits  2    PT Start Time  0786    PT Stop Time  1517    PT Time Calculation (min)  40 min    Activity Tolerance  Patient tolerated treatment well    Behavior During Therapy  [facility] for tasks assessed/performed       Past Medical History:  Diagnosis Date  . Arthritis    "qwhere; hands, feet, different body parts from time to time" (05/08/2016)  . Asthma   . Breast mass 07/03/2016  . Cancer [facility])    right breast  . Complication of anesthesia    "they used an adult tube going down my throat; caused alot of problems; was told to always have pediatric tube placed" (05/08/2016)  . COPD (chronic obstructive pulmonary disease) ([place])   . GERD (gastroesophageal reflux disease)   . History of hiatal hernia   . History of radiation therapy 10/07/16- 11/17/16   Right Breast and regional nodes 50 Gy in 25 fractions to breast and at least 45 Gy in 25 fractions to nodes, Right Breast boost 10 Gy in 5 fractions.   . Hyperglycemia   . Hyperlipidemia   . Hypertension   . Hypothyroidism   . Lump of breast, right    "suppose to have it checked soon" (05/08/2016)  . Migraine    "none for years" (05/08/2016)  . Osteopenia   . Pneumonia "several times"  . Status post radioactive iodine thyroid ablation   . Thyroid disease     Past Surgical History:  Procedure Laterality Date  . 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 APPENDECTOMY  1963  . BREAST LUMPECTOMY WITH AXILLARY LYMPH NODE DISSECTION Right 08/14/2016   Procedure: RIGHT BREAST LUMPECTOMY WITH AXILLARY LYMPH NODE DISSECTION;   Surgeon: Stark Klein, MD;  Location: [place];  Service: General;  Laterality: Right;  . DILATION AND CURETTAGE OF UTERUS  "several"   "when I was young"  . INGUINAL HERNIA REPAIR Bilateral 2000  . MASS EXCISION Right 08/14/2016   Procedure: EXCISION RIGHT ARM MASS 2X3CM;  Surgeon: Stark Klein, MD;  Location: [place];  Service: General;  Laterality: Right;  . POLYPECTOMY     "vocal cord"  . TONSILLECTOMY    . TOTAL HIP ARTHROPLASTY Left 05/09/2016   Procedure: TOTAL HIP ARTHROPLASTY ANTERIOR APPROACH;  Surgeon: Paralee Cancel, MD;  Location: [place];  Service: Orthopedics;  Laterality: Left;  . TUBAL LIGATION    . VAGINAL HYSTERECTOMY  1979    There were no vitals filed for this visit.  Subjective Assessment - 08/03/17 1438    Subjective  The only change is that they took me off the lipitor and they put me back on 1/2 a tablet of letrozole. I'm having such jaw pain now. Has been doing stretches twice a day, but feels no better. My regular doctor gave me a pill that is supposed to help me sleep, and I think it has a little pain medicine in it.  That has helped me sleep. '    Pertinent History  Diagnosed with  right breast cancer and the had lumpectomy 08/14/16 with 15 lymph nodes removed, 2 positive. Radiation was completed 11/17/16.  She says axilla was targeted.  Is on anastrozole.  Broke hip at gym in February and had a THA on the left side. COPD  HTN and on medication. Has had arm and leg lipomas with right ankle surgery x 2. Osteopenia. h/o right rotator cuff injury with physical therapy for that, full recovery.    Patient Stated Goals  find out what machines to use at the gym and which not to    Currently in Pain?  Yes    Pain Score  4     Pain Location  Axilla    Pain Orientation  Right    Pain Descriptors / Indicators  Other (Comment) "It's livable."    Pain Type  Chronic pain    Aggravating Factors   unsure    Pain Relieving Factors  nothing                       [facility]  Adult PT Treatment/Exercise - 08/03/17 0001      Self-Care   Self-Care  Other Self-Care Comments    Other Self-Care Comments   Instructed in scar mobilization for lymph node scar, but putting pressure not directly on the scar where it is tender, but a bit away from the scar, and working into the least  mobile direction. Also talked about cording, which patient brought up.  She feels something that sounds like cording, but I told her I don't think this is what she has because of its intermittent nature with her.             PT Education - 08/03/17 1558    Education provided  Yes    Education Details  about scar mobilization to lymph node scar; about the possibility that a TENS unit could help with her pain, and about where and how to obtain one online, including features to look for; also about cording, which patient brought up and questioned, and about that it is not typical to have ongoing pain from breast surgery such as she has    Person(s) Educated  Patient    Methods  Explanation;Handout    Comprehension  Verbalized understanding          PT Long Term Goals - 08/03/17 1604      PT LONG TERM GOAL #1   Title  Pt. will report at least 25% decrease in right axilla/upper flank/shoulder area pain.    Status  Not Met      PT LONG TERM GOAL #2   Title  Pt. will be knowledgeable about safe exercise for her at the gym.    Status  Achieved            Plan - 08/03/17 1559    Clinical Impression Statement  Pt. returned today having tried the suggestions I made to her about stretching more. She feels no better as a result.  Her doctors have also adjusted medications that may have contributed to pain issues (lipitor and letrozole) but so far, she does not feel better.  I taught her about scar mobilization to her lymph node scar and about the possibility that a TENS unit, now available online, could help with her pain.    Rehab Potential  Good    Clinical Impairments Affecting  Rehab Potential  COPD    PT Treatment/Interventions  ADLs/Self Care Home Management;Therapeutic exercise;Patient/family education;Manual  techniques;Passive range of motion    PT Next Visit Plan  Discharge. Pt. knows we are available to her with a doctor's referral should other needs arise.    PT Home Exercise Plan  stretching, strengthening at gym, yoga    Consulted and Agree with Plan of Care  Patient       Patient will benefit from skilled therapeutic intervention in order to improve the following deficits and impairments:  Pain, Decreased range of motion  Visit Diagnosis: Stiffness of right shoulder, not elsewhere classified  Right shoulder pain, unspecified chronicity  Localized edema     Problem List Patient Active Problem List   Diagnosis Date Noted  . Breast cancer metastasized to axillary lymph node, right ([place]) 08/14/2016  . Carcinoma of upper-outer quadrant of right breast in female, estrogen receptor positive ([place]) 07/25/2016  . Hypothyroidism 05/09/2016  . Closed left femoral fracture ([place]) 05/08/2016  . COPD, group D, by GOLD 2017 classification ([place]) 01/28/2011    SALISBURY,DONNA 08/03/2017, 4:05 PM  [place], [place], [address] Phone: [phone]   Fax:  [phone]  Name: DANNIELLA ROBBEN MRN: [id number] Date of Birth: 11-Jun-1945  Serafina Royals, PT 08/03/17 4:05 PM

## 2017-08-21 ENCOUNTER — Other Ambulatory Visit: Payer: Self-pay | Admitting: Adult Health

## 2017-08-21 DIAGNOSIS — C50411 Malignant neoplasm of upper-outer quadrant of right female breast: Secondary | ICD-10-CM

## 2017-08-21 DIAGNOSIS — Z170 Estrogen receptor positive status [ER+]: Principal | ICD-10-CM

## 2017-09-01 ENCOUNTER — Ambulatory Visit: Admission: RE | Admit: 2017-09-01 | Payer: PPO | Source: Ambulatory Visit

## 2017-09-01 ENCOUNTER — Ambulatory Visit
Admission: RE | Admit: 2017-09-01 | Discharge: 2017-09-01 | Disposition: A | Discharge status: Home / Self Care | Payer: PPO | Source: Ambulatory Visit | Attending: Adult Health | Admitting: Adult Health

## 2017-09-01 DIAGNOSIS — C50411 Malignant neoplasm of upper-outer quadrant of right female breast: Secondary | ICD-10-CM

## 2017-09-01 DIAGNOSIS — Z170 Estrogen receptor positive status [ER+]: Principal | ICD-10-CM

## 2017-09-01 DIAGNOSIS — R928 Other abnormal and inconclusive findings on diagnostic imaging of breast: Secondary | ICD-10-CM | POA: Diagnosis not present

## 2017-09-01 HISTORY — DX: Personal history of irradiation: Z92.3

## 2017-09-05 ENCOUNTER — Other Ambulatory Visit: Payer: Self-pay | Admitting: Pulmonary Disease

## 2017-12-07 ENCOUNTER — Inpatient Hospital Stay: Payer: PPO | Attending: Hematology and Oncology | Admitting: Hematology and Oncology

## 2017-12-07 DIAGNOSIS — M8580 Other specified disorders of bone density and structure, unspecified site: Secondary | ICD-10-CM

## 2017-12-07 DIAGNOSIS — C50411 Malignant neoplasm of upper-outer quadrant of right female breast: Secondary | ICD-10-CM | POA: Insufficient documentation

## 2017-12-07 DIAGNOSIS — Z170 Estrogen receptor positive status [ER+]: Secondary | ICD-10-CM

## 2017-12-07 DIAGNOSIS — Z79811 Long term (current) use of aromatase inhibitors: Secondary | ICD-10-CM | POA: Diagnosis not present

## 2017-12-07 MED ORDER — ANASTROZOLE 1 MG PO TABS: 0.5000 mg | ORAL_TABLET | Freq: Every day | ORAL | 1 refills | Status: DC

## 2017-12-07 NOTE — Assessment & Plan Note (Addendum)
Rt Lumpectomy 08/14/16: IDC grade 2, 1.8 cm, 2/15 LN, ER 90%, PR 60%, Ki-67 12%, HER-2 negative ratio 0.41; T1CN1 (Stage 2A)  Mammaprint: High Risk: Risk of recurrence 20% at 5 years and 29% of 10 years. Chemotherapy would most likely decrease the risk of recurrence at 5 years to 5.4%.(Patient refused chemotherapy and radiation)  Current treatment: Anastrozole 1 mg by mouth dailyrestarted 09/01/2016, reduced dosage to half tablet daily  Anastrozole toxicities: Denies any hot flashes  Patient has chronic osteoarthritis and appears to need an ankle surgery because of a lipoma growing into the joint.  Breast cancer surveillance: Annual mammograms and periodic breast exams Bone density 09/15/2016: T score -2.3: Osteopenia I recommended that the patient take bisphosphonate therapy along with calcium and vitamin D  Return to clinic in 1 year for follow-up

## 2017-12-07 NOTE — Progress Notes (Signed)
Patient Care Team: Ernestene Kiel, MD as PCP - General (Internal Medicine) Collene Gobble, MD (Pulmonary Disease) Stark Klein, MD as Consulting Physician (Surgical Oncology) Eppie Gibson, MD as Attending Physician (Radiation Oncology) Nicholas Lose, MD as Consulting Physician (Hematology and Oncology) Delice Bison Charlestine Massed, NP as Nurse Practitioner (Hematology and Oncology)  DIAGNOSIS:  Encounter Diagnosis  Name Primary?  . Carcinoma of upper-outer quadrant of right breast in female, estrogen receptor positive ([place])     SUMMARY OF ONCOLOGIC HISTORY:   Carcinoma of upper-outer quadrant of right breast in female, estrogen receptor positive ([place])   07/03/2016 Initial Diagnosis    Right breast biopsy 10:30 position: IDC, lymphovascular invasion present, right axillary lymph node positive, grade 3, ER 90%, PR 60%, Ki-67 12%, HER-2 negative ratio 0.41; right breast mass 9 cm from nipple and 30 position: 1.7 x 1 x 1.6 cm, 2 abnormal lymph nodes right axilla largest 1.9 cm smaller 1.2 cm; T1c N1 stage II a    08/14/2016 Surgery    Rt Lumpectomy: IDC grade 2, 1.8 cm, 2/15 LN, ER 90%, PR 60%, Ki-67 12%, HER-2 negative ratio 0.41; T1CN1 (Stage 2A)    08/18/2016 Miscellaneous    Mammaprint high risk luminal B: Patient refused chemotherapy. 5 year risk of recurrence 20%, 10 year risk of recurrence 29%; patient refused chemotherapy and radiation    09/01/2016 -  Anti-estrogen oral therapy    Anastrozole 1 mg daily switched to letrozole 06/16/2017 due to arthralgias and myalgias, switched back to anastrozole half tablet daily     CHIEF COMPLIANT: Follow-up on anastrozole therapy  INTERVAL HISTORY: Linda [name] is a 72 year old with above-mentioned history of right breast cancer who is currently on oral antiestrogen therapy.  She is now able to tolerate half a tablet of anastrozole.  She continues to have multiple orthopedic problems.  She has difficulty with gait because of ankle pain.   Apparently she has a lipoma growing into the ankle and has had 2 prior surgeries for this.  REVIEW OF SYSTEMS:   Constitutional: Denies fevers, chills or abnormal weight loss Eyes: Denies blurriness of vision Ears, nose, mouth, throat, and face: Denies mucositis or sore throat Respiratory: Chronic shortness of breath from COPD, pursued lip breathing Cardiovascular: Denies palpitation, chest discomfort Gastrointestinal: Nausea and vomiting Skin: Denies abnormal skin rashes Lymphatics: Denies new lymphadenopathy or easy bruising Neurological:Denies numbness, tingling or new weaknesses Behavioral/Psych: Mood is stable, no new changes  Extremities: No lower extremity edema Breast:  denies any pain or lumps or nodules in either breasts All other systems were reviewed with the patient and are negative.  I have reviewed the past medical history, past surgical history, social history and family history with the patient and they are unchanged from previous note.  ALLERGIES:  is allergic to aspirin.  MEDICATIONS:  Current Outpatient Medications  Medication Sig Dispense Refill  . amLODipine (NORVASC) 5 MG tablet Take 5 mg by mouth daily before breakfast.     . anastrozole (ARIMIDEX) 1 MG tablet Take 0.5 tablets (0.5 mg total) by mouth daily. 90 tablet 1  . Cholecalciferol (VITAMIN D3) 2000 units capsule Take 2,000 Units by mouth every 14 (fourteen) days. Once every 2-3 weeks (when she remembers)    . DALIRESP 500 MCG TABS tablet TAKE 1 TABLET BY MOUTH ONCE DAILY 30 tablet 5  . Fluticasone-Umeclidin-Vilant (TRELEGY ELLIPTA) 100-62.5-25 MCG/INH AEPB Inhale 1 puff into the lungs daily. 60 each 5  . losartan (COZAAR) 100 MG tablet Take 100 mg by  mouth daily before breakfast.     . meloxicam (MOBIC) 15 MG tablet Take 15 mg by mouth daily before breakfast.     . omeprazole (PRILOSEC) 20 MG capsule Take 20 mg by mouth daily before breakfast.     . Omeprazole-Sodium Bicarbonate (ZEGERID OTC) 20-1100 MG  CAPS capsule Take 1 capsule by mouth daily before breakfast.    . pravastatin (PRAVACHOL) 20 MG tablet Take 20 mg by mouth daily before breakfast.     . PROAIR HFA 108 (90 Base) MCG/ACT inhaler     . Probiotic Product ([place]) Take by mouth.     No current facility-administered medications for this visit.     PHYSICAL EXAMINATION: ECOG PERFORMANCE STATUS: 2 - Symptomatic, <50% confined to bed  Vitals:   12/07/17 1449  BP: (!) 174/88  Pulse: 85  Resp: 17  Temp: 97.7 F (36.5 C)  SpO2: 99%   Filed Weights   12/07/17 1449  Weight: 118 lb 11.2 oz (53.8 kg)    GENERAL:alert, no distress and comfortable SKIN: skin color, texture, turgor are normal, no rashes or significant lesions EYES: normal, Conjunctiva are pink and non-injected, sclera clear OROPHARYNX:no exudate, no erythema and lips, buccal mucosa, and tongue normal  NECK: supple, thyroid normal size, non-tender, without nodularity LYMPH:  no palpable lymphadenopathy in the cervical, axillary or inguinal LUNGS: Diminished breath sounds at the bases HEART: regular rate & rhythm and no murmurs and no lower extremity edema ABDOMEN:abdomen soft, non-tender and normal bowel sounds MUSCULOSKELETAL:no cyanosis of digits and no clubbing  NEURO: alert & oriented x 3 with fluent speech, no focal motor/sensory deficits EXTREMITIES: No lower extremity edema BREAST: No palpable masses or nodules in either right or left breasts. No palpable axillary supraclavicular or infraclavicular adenopathy no breast tenderness or nipple discharge. (exam performed in the presence of a chaperone)  LABORATORY DATA:  I have reviewed the data as listed CMP Latest Ref Rng & Units 08/15/2016 08/14/2016 08/07/2016  Glucose 65 - 99 mg/dL 97 - 121(H)  BUN 6 - 20 mg/dL 15 - 17  Creatinine 0.44 - 1.00 mg/dL 1.11(H) 0.76 0.97  Sodium 135 - 145 mmol/L 135 - 138  Potassium 3.5 - 5.1 mmol/L 4.3 - 3.7  Chloride 101 - 111 mmol/L 102 - 103  CO2  22 - 32 mmol/L 27 - 25  Calcium 8.9 - 10.3 mg/dL 8.9 - 9.3  Total Protein 6.5 - 8.1 g/dL - - -  Total Bilirubin 0.3 - 1.2 mg/dL - - -  Alkaline Phos 38 - 126 U/L - - -  AST 15 - 41 U/L - - -  ALT 14 - 54 U/L - - -    Lab Results  Component Value Date   WBC 8.4 08/15/2016   HGB 12.4 08/15/2016   HCT 39.3 08/15/2016   MCV 91.4 08/15/2016   PLT 256 08/15/2016   NEUTROABS 14.9 (H) 05/08/2016    ASSESSMENT & PLAN:  Carcinoma of upper-outer quadrant of right breast in female, estrogen receptor positive ([place]) Rt Lumpectomy 08/14/16: IDC grade 2, 1.8 cm, 2/15 LN, ER 90%, PR 60%, Ki-67 12%, HER-2 negative ratio 0.41; T1CN1 (Stage 2A)  Mammaprint: High Risk: Risk of recurrence 20% at 5 years and 29% of 10 years. Chemotherapy would most likely decrease the risk of recurrence at 5 years to 5.4%.(Patient refused chemotherapy and radiation)  Current treatment: Anastrozole 1 mg by mouth dailyrestarted 09/01/2016, reduced dosage to half tablet daily  Anastrozole toxicities: Denies any hot flashes  Patient has chronic osteoarthritis and appears to need an ankle surgery because of a lipoma growing into the joint.  Breast cancer surveillance:  Annual mammogram June 2019: No evidence of malignancy Breast exam 12/07/2017: Benign Bone density 09/15/2016: T score -2.3: Osteopenia I recommended that the patient take bisphosphonate therapy along with calcium and vitamin D  Nausea issues: Probably related to recent GI illness.  Return to clinic in 1 year for follow-up   No orders of the defined types were placed in this encounter.  The patient has a good understanding of the overall plan. she agrees with it. she will call with any problems that may develop before the next visit here.   Harriette Ohara, MD 12/07/17

## 2017-12-08 ENCOUNTER — Telehealth: Payer: Self-pay | Admitting: Hematology and Oncology

## 2017-12-08 NOTE — Telephone Encounter (Signed)
Per 9/9 los.  Mailed calendar for next year's appt.

## 2017-12-11 DIAGNOSIS — M19071 Primary osteoarthritis, right ankle and foot: Secondary | ICD-10-CM | POA: Diagnosis not present

## 2017-12-11 DIAGNOSIS — M25571 Pain in right ankle and joints of right foot: Secondary | ICD-10-CM | POA: Diagnosis not present

## 2017-12-15 DIAGNOSIS — M19079 Primary osteoarthritis, unspecified ankle and foot: Secondary | ICD-10-CM | POA: Insufficient documentation

## 2017-12-28 DIAGNOSIS — M25571 Pain in right ankle and joints of right foot: Secondary | ICD-10-CM | POA: Diagnosis not present

## 2017-12-28 DIAGNOSIS — M19071 Primary osteoarthritis, right ankle and foot: Secondary | ICD-10-CM | POA: Diagnosis not present

## 2018-01-04 ENCOUNTER — Encounter: Payer: Self-pay | Admitting: Pulmonary Disease

## 2018-01-04 ENCOUNTER — Ambulatory Visit: Payer: PPO | Admitting: Pulmonary Disease

## 2018-01-04 DIAGNOSIS — J449 Chronic obstructive pulmonary disease, unspecified: Secondary | ICD-10-CM | POA: Diagnosis not present

## 2018-01-04 NOTE — Patient Instructions (Signed)
I am glad your breathing is stable Continue with the trelegy, Daliresp Continue to work on smoking cessation  Follow-up in 6 months.

## 2018-01-04 NOTE — Progress Notes (Signed)
Linda [name]    [id number]    10-21-1945  Primary Care Physician:Prochnau, Chrys Racer, MD  Referring Physician: Ernestene Kiel, MD [address] [place] [place], [place] [address]  Chief complaint:   Follow up for  COPD GOLD D  HPI: Linda [name] is a 72 year-old with COPD (GOLD D, CAT score 25, multiple exacerbation over past year, FEV1 41%). She was seen by Dr. Lamonte Sakai in 2012. Maintained on Spiriva and Symbicort. His symptoms have been stable until this year when she has several exacerbations over the winter requiring courses of antibiotics and steroids. Her primary care physician has referred her back to [place] for further management. Her chief complaint is dyspnea on exertion, loss of energy, fatigue,  chronic cough with sputum production.   She's had a diagnosis of breast cancer in April 2018 and underwent lumpectomy and XRT. She declined chemotherapy.  Interim History: Quit smoking briefly but is back to smoking 2 cigarettes every day Complains of chronic dyspnea on exertion, cough with mucus production.  Inhalers changed to Trelegy at last visit.  She is doing well with this change She is dealing with chronic ankle osteoarthritis which is limiting her mobility and she is unable to exercise.  Outpatient Encounter Medications as of 01/04/2018  Medication Sig  . amLODipine (NORVASC) 5 MG tablet Take 10 mg by mouth daily before breakfast.   . anastrozole (ARIMIDEX) 1 MG tablet Take 0.5 tablets (0.5 mg total) by mouth daily.  . Cholecalciferol (VITAMIN D3) 2000 units capsule Take 2,000 Units by mouth every 14 (fourteen) days. Once every 2-3 weeks (when she remembers)  . DALIRESP 500 MCG TABS tablet TAKE 1 TABLET BY MOUTH ONCE DAILY  . Fluticasone-Umeclidin-Vilant (TRELEGY ELLIPTA) 100-62.5-25 MCG/INH AEPB Inhale 1 puff into the lungs daily.  Marland Kitchen losartan (COZAAR) 100 MG tablet Take 50 mg by mouth daily before breakfast.   . meloxicam (MOBIC) 15 MG tablet Take 15 mg by mouth daily before  breakfast.   . omeprazole (PRILOSEC) 20 MG capsule Take 20 mg by mouth daily before breakfast.   . Omeprazole-Sodium Bicarbonate (ZEGERID OTC) 20-1100 MG CAPS capsule Take 1 capsule by mouth daily before breakfast.  . pravastatin (PRAVACHOL) 20 MG tablet Take 20 mg by mouth daily before breakfast.   . PROAIR HFA 108 (90 Base) MCG/ACT inhaler   . Probiotic Product ([place]) Take by mouth.   No facility-administered encounter medications on file as of 01/04/2018.    Physical Exam: Blood pressure 132/80, pulse 89, height 5\' 4"  (1.626 m), weight 117 lb 9.6 oz (53.3 kg), SpO2 94 %. Gen:      No acute distress HEENT:  EOMI, sclera anicteric Neck:     No masses; no thyromegaly Lungs:    Clear to auscultation bilaterally; normal respiratory effort CV:         Regular rate and rhythm; no murmurs Abd:      + bowel sounds; soft, non-tender; no palpable masses, no distension Ext:    No edema; adequate peripheral perfusion Skin:      Warm and dry; no rash Neuro: alert and oriented x 3 Psych: normal mood and affect  Data Reviewed: Imaging CT chest 02/26/15- mild left lower lobe atelectasis, moderate emphysematous changes.  CT chest 08/01/16- moderate emphysematous changes.  No suspicious lung nodules or mass.  Chronic scarring in the right lower lobe. I have reviewed the images personally  PFTs  02/18/11 FVC 2.42 [117%), FEV1 1.46 (69%), F/F  46, TLC 112%, DLCO 69% Moderate obstructive defect, mild reduction in diffusion capacity.  08/23/15 FVC 2.14 [71%], FEV1 0.94 [41%], F/F 44, TLC 114%, DLCO 57% Severe obstructive defect with moderate reduction in diffusion capacity   Assessment:  Severe COPD. GOLD stage D Continue trelegy inhaler, Daliresp  She has limited mobility due to ankle arthritis and would not be able to do pulmonary rehab.  Active smoker Unfortunately she started smoking again Encouraged her to quit smoking.  She says that Chantix and wellbutrin does not  work and wants to quit on her own  Health maintenance 9/40/19 influenza 01/12/2014-Prevnar 04/01/2011-Pneumovax  Plan/Recommendations: - Continue daliresp, trelegy - Smoking cessation  Marshell Garfinkel MD [facility] 01/04/2018, 1:34 PM  CC: Ernestene Kiel, MD

## 2018-01-04 NOTE — Progress Notes (Signed)
Inhaler training given. In-check peak flow #:55

## 2018-01-11 DIAGNOSIS — M25571 Pain in right ankle and joints of right foot: Secondary | ICD-10-CM | POA: Diagnosis not present

## 2018-01-25 DIAGNOSIS — M19271 Secondary osteoarthritis, right ankle and foot: Secondary | ICD-10-CM | POA: Diagnosis not present

## 2018-01-25 DIAGNOSIS — M19071 Primary osteoarthritis, right ankle and foot: Secondary | ICD-10-CM | POA: Diagnosis not present

## 2018-01-30 ENCOUNTER — Other Ambulatory Visit: Payer: Self-pay | Admitting: Pulmonary Disease

## 2018-02-05 DIAGNOSIS — M19071 Primary osteoarthritis, right ankle and foot: Secondary | ICD-10-CM | POA: Diagnosis not present

## 2018-02-12 ENCOUNTER — Other Ambulatory Visit: Payer: Self-pay | Admitting: Pulmonary Disease

## 2018-03-11 ENCOUNTER — Other Ambulatory Visit: Payer: Self-pay | Admitting: Pulmonary Disease

## 2018-03-22 DIAGNOSIS — M19071 Primary osteoarthritis, right ankle and foot: Secondary | ICD-10-CM | POA: Diagnosis not present

## 2018-04-01 DIAGNOSIS — Z7189 Other specified counseling: Secondary | ICD-10-CM | POA: Diagnosis not present

## 2018-04-01 DIAGNOSIS — J449 Chronic obstructive pulmonary disease, unspecified: Secondary | ICD-10-CM | POA: Diagnosis not present

## 2018-04-01 DIAGNOSIS — Z1339 Encounter for screening examination for other mental health and behavioral disorders: Secondary | ICD-10-CM | POA: Diagnosis not present

## 2018-04-01 DIAGNOSIS — I10 Essential (primary) hypertension: Secondary | ICD-10-CM | POA: Diagnosis not present

## 2018-04-01 DIAGNOSIS — E785 Hyperlipidemia, unspecified: Secondary | ICD-10-CM | POA: Diagnosis not present

## 2018-04-01 DIAGNOSIS — Z1331 Encounter for screening for depression: Secondary | ICD-10-CM | POA: Diagnosis not present

## 2018-04-01 DIAGNOSIS — E890 Postprocedural hypothyroidism: Secondary | ICD-10-CM | POA: Diagnosis not present

## 2018-04-01 DIAGNOSIS — Z79899 Other long term (current) drug therapy: Secondary | ICD-10-CM | POA: Diagnosis not present

## 2018-04-01 DIAGNOSIS — Z0000 Encounter for general adult medical examination without abnormal findings: Secondary | ICD-10-CM | POA: Diagnosis not present

## 2018-04-01 DIAGNOSIS — F17200 Nicotine dependence, unspecified, uncomplicated: Secondary | ICD-10-CM | POA: Diagnosis not present

## 2018-04-01 DIAGNOSIS — R0789 Other chest pain: Secondary | ICD-10-CM | POA: Diagnosis not present

## 2018-04-01 DIAGNOSIS — R7301 Impaired fasting glucose: Secondary | ICD-10-CM | POA: Diagnosis not present

## 2018-04-20 DIAGNOSIS — Z1211 Encounter for screening for malignant neoplasm of colon: Secondary | ICD-10-CM | POA: Diagnosis not present

## 2018-05-27 ENCOUNTER — Ambulatory Visit: Payer: PPO | Admitting: Nurse Practitioner

## 2018-05-28 ENCOUNTER — Ambulatory Visit: Payer: PPO | Admitting: Pulmonary Disease

## 2018-05-28 ENCOUNTER — Encounter: Payer: Self-pay | Admitting: Pulmonary Disease

## 2018-05-28 VITALS — BP 118/78 | HR 87 | Temp 97.4°F | Ht 64.0 in | Wt 117.0 lb

## 2018-05-28 DIAGNOSIS — J449 Chronic obstructive pulmonary disease, unspecified: Secondary | ICD-10-CM | POA: Diagnosis not present

## 2018-05-28 DIAGNOSIS — Z87891 Personal history of nicotine dependence: Secondary | ICD-10-CM | POA: Insufficient documentation

## 2018-05-28 MED ORDER — PREDNISONE 10 MG PO TABS: ORAL_TABLET | ORAL | 0 refills | Status: DC

## 2018-05-28 MED ORDER — AZITHROMYCIN 250 MG PO TABS: ORAL_TABLET | ORAL | 0 refills | Status: DC

## 2018-05-28 NOTE — Assessment & Plan Note (Signed)
Assessment: 2017 pulmonary function test showing COPD Gold 3 mMRC 4 today 2018 CT chest showing moderate emphysema Recently stop smoking on 05/01/2018 Currently using Nicotrol inhaler Maintained on Trelegy Ellipta Patient endorses 10 days of symptoms with progressive fatigue, worsening shortness of breath, and increased mucus production Expiratory wheeze on exam today  Plan: Z-Pak today Prednisone taper today Continue Trelegy Ellipta Can use rescue inhaler as needed Continue Nicotrol Inhaler Continue to not smoke Emphasized the importance the patient that she needs to follow-up with our office sooner when she is having onset of symptoms due to the severity of her COPD and recent smoking history Patient to resume gym activities and increase daily exercise >>>If patient is unable to maintain going to the gym regularly could consider pulmonary rehab referral in the future Patient to follow-up in 4 to 6 weeks for follow-up with Dr. Vaughan [name] or Linda Quaker, FNP

## 2018-05-28 NOTE — Assessment & Plan Note (Signed)
Assessment: Stop smoking on 05/01/2018 Patient is working with primary care with management of a Nicotrol inhaler Currently in a COPD exacerbation today mMRC 4 today  Plan: Continue to not smoke Follow-up with primary care or our office if you have a relapse in your smoking Continue Nicotrol Inhaler under guidance of primary care

## 2018-05-28 NOTE — Patient Instructions (Addendum)
Azithromycin 250mg  tablet  >>>Take 2 tablets (500mg  total) today, and then 1 tablet (250mg ) for the next four days  >>>take with food  >>>can also take probiotic and / or yogurt while on antibiotic   Prednisone 10mg  tablet  >>>4 tabs for 2 days, then 3 tabs for 2 days, 2 tabs for 2 days, then 1 tab for 2 days, then stop >>>take with food  >>>take in the morning   Continue Trelegy Ellipta  >>> 1 puff daily in the morning >>>rinse mouth out after use  >>> This inhaler contains 3 medications that help manage her respiratory status, contact our office if you cannot afford this medication or unable to remain on this medication  Only use your albuterol as a rescue medication to be used if you can't catch your breath by resting or doing a relaxed purse lip breathing pattern.  - The less you use it, the better it will work when you need it. - Ok to use up to 2 puffs  every 4 hours if you must but call for immediate appointment if use goes up over your usual need - Don't leave home without it !!  (think of it like the spare tire for your car)    Note your daily symptoms > remember "red flags" for COPD:   >>>Increase in cough >>>increase in sputum production >>>increase in shortness of breath or activity  intolerance.   If you notice these symptoms, please call the office to be seen.    We recommend that you stop smoking.  >>>You need to set a quit date >>>If you have friends or family who smoke, let them know you are trying to quit and not to smoke around you or in your living environment  Smoking Cessation Resources:  1 800 QUIT NOW  >>> Patient to call this resource and utilize it to help support her quit smoking >>> Keep up your hard work with stopping smoking  You can also contact the [facility] >>>For smoking cessation classes call [phone]  We do not recommend using e-cigarettes as a form of stopping smoking  You can sign up for smoking cessation support  texts and information:  >>>https://smokefree.gov/smokefreetxt  Continue Nicotrol Inhaler as prescribed by primary care  Keep up the hard work with stopping smoking    Follow-up with our office in 4 to 6 weeks with Dr. Vaughan Browner, if no availability then Wyn Quaker, FNP   It is flu season:   >>> Best ways to protect herself from the flu: Receive the yearly flu vaccine, practice good hand hygiene washing with soap and also using hand sanitizer when available, eat a nutritious meals, get adequate rest, hydrate appropriately   Please contact the office if your symptoms worsen or you have concerns that you are not improving.   Thank you for choosing [facility] for your healthcare, and for allowing [place] to partner with you on your healthcare journey. I am thankful to be able to provide care to you today.   Wyn Quaker FNP-C    Chronic Obstructive Pulmonary Disease Exacerbation Chronic obstructive pulmonary disease (COPD) is a long-term (chronic) lung problem. In COPD, the flow of air from the lungs is limited. COPD exacerbations are times that breathing gets worse and you need more than your normal treatment. Without treatment, they can be life threatening. If they happen often, your lungs can become more damaged. If your COPD gets worse, your doctor may treat you with:  Medicines.  Oxygen.  Different ways to clear your airway, such as using a mask. Follow these instructions at home: Medicines  Take over-the-counter and prescription medicines only as told by your doctor.  If you take an antibiotic or steroid medicine, do not stop taking the medicine even if you start to feel better.  Keep up with shots (vaccinations) as told by your doctor. Be sure to get a yearly (annual) flu shot. Lifestyle  Do not smoke. If you need help quitting, ask your doctor.  Eat healthy foods.  Exercise regularly.  Get plenty of sleep.  Avoid tobacco smoke and other things that can bother  your lungs.  Wash your hands often with soap and water. This will help keep you from getting an infection. If you cannot use soap and water, use hand sanitizer.  During flu season, avoid areas that are crowded with people. General instructions  Drink enough fluid to keep your pee (urine) clear or pale yellow. Do not do this if your doctor has told you not to.  Use a cool mist machine (vaporizer).  If you use oxygen or a machine that turns medicine into a mist (nebulizer), continue to use it as told.  Follow all instructions for rehabilitation. These are steps you can take to make your body work better.  Keep all follow-up visits as told by your doctor. This is important. Contact a doctor if:  Your COPD symptoms get worse than normal. Get help right away if:  You are short of breath and it gets worse.  You have trouble talking.  You have chest pain.  You cough up blood.  You have a fever.  You keep throwing up (vomiting).  You feel weak or you pass out (faint).  You feel confused.  You are not able to sleep because of your symptoms.  You are not able to do daily activities. Summary  COPD exacerbations are times that breathing gets worse and you need more treatment than normal.  COPD exacerbations can be very serious and may cause your lungs to become more damaged.  Do not smoke. If you need help quitting, ask your doctor.  Stay up-to-date on your shots. Get a flu shot every year. This information is not intended to replace advice given to you by your health care provider. Make sure you discuss any questions you have with your health care provider. Document Released: 03/06/2011 Document Revised: 04/21/2016 Document Reviewed: 04/21/2016 Elsevier Interactive Patient Education  2019 [employer].

## 2018-05-28 NOTE — Progress Notes (Signed)
@Patient  ID: Linda [name], female    DOB: 07/05/1945, 73 y.o.   MRN: [id number]  Chief Complaint  Patient presents with  . Acute Visit    Shortness of breath, for 10 days, COPD Gold 3    Referring provider: Ernestene Kiel, MD  HPI:  73 year old female current every day smoker followed in our office for COPD Gold III D  PMH: Breast cancer (diagnosed in April/2018 underwent lumpectomy and XRT, patient declined chemotherapy) Smoker/ Smoking History: Current every day smoker.  Smoking 2 cigarettes a day. Maintenance: Trelegy Ellipta Pt of: Dr. Vaughan Browner  05/28/2018  - Visit   73 year old female former smoker (recently quit about 3 weeks ago-05/01/2018-using Nicotrol Inhaler to stop) presenting to our office today for an acute visit of increased shortness of breath and wheezing over the last 10 days.  Patient reports that she developed the symptoms and has had progressive we were shortness of breath, potential chills and low-grade fevers at initial onset of symptoms but that has since resolved.  Initial sinus pain and pressure but that has resolved as of 5 days ago.  Patient reports that after she developed symptoms then stated her grandkids were who were positive for flu A.  The patient never contacted primary care or our office to start prophylactic Tamiflu or to be seen and swabbed herself due to her symptoms.  The patient is afebrile in office today.  But still is wheezing, fatigued and short of breath.  Patient with a history of recurrent COPD exacerbations.  COPD Gold 3D.  mMRC today is 4.  MMRC - Breathlessness Score 4 - I am too breathless to leave the house or I am breathlessness when dressing    Tests:   Data Reviewed: PFTs  02/18/11 FVC 2.42 [117%), FEV1 1.46 (69%), F/F 46, TLC 112%, DLCO 69% Moderate obstructive defect, mild reduction in diffusion capacity.  08/23/15 FVC 2.14 [71%], FEV1 0.94 [41%], F/F 44, TLC 114%, DLCO 57% Severe obstructive defect with moderate  reduction in diffusion capacity  CT chest 02/26/15- mild left lower lobe atelectasis, moderate emphysematous changes.  CT chest 08/01/16- moderate emphysematous changes.  No suspicious lung nodules or mass.  Chronic scarring in the right lower lobe.   FENO:  No results found for: NITRICOXIDE  PFT: PFT Results Latest Ref Rng & Units 08/23/2015  FVC-Pre L 2.14  FVC-Predicted Pre % 71  FVC-Post L 2.44  FVC-Predicted Post % 82  Pre FEV1/FVC % % 44  Post FEV1/FCV % % 46  FEV1-Pre L 0.94  FEV1-Predicted Pre % 41  FEV1-Post L 1.11  DLCO UNC% % 57  DLCO COR %Predicted % 60  TLC L 5.77  TLC % Predicted % 114  RV % Predicted % 136    Imaging: No results found.    Specialty Problems      Pulmonary Problems   COPD, group D, by GOLD 2017 classification ([place])    PFTs  02/18/11 FVC 2.42 [117%), FEV1 1.46 (69%), F/F 46, TLC 112%, DLCO 69% Moderate obstructive defect, mild reduction in diffusion capacity.  08/23/15 FVC 2.14 [71%], FEV1 0.94 [41%], F/F 44, TLC 114%, DLCO 57% Severe obstructive defect with moderate reduction in diffusion capacity  CT chest 02/26/15- mild left lower lobe atelectasis, moderate emphysematous changes.  CT chest 08/01/16- moderate emphysematous changes.  No suspicious lung nodules or mass.  Chronic scarring in the right lower lobe.         Allergies  Allergen Reactions  . Aspirin  ulcer    Immunization History  Administered Date(s) Administered  . Influenza, High Dose Seasonal PF 01/03/2016, 12/16/2016, 12/12/2017  . Influenza,inj,Quad PF,6+ Mos 12/30/2014  . Pneumococcal Conjugate-13 12/26/2010, 01/12/2014  . Pneumococcal Polysaccharide-23 04/01/2011  . Td 12/26/2010    Past Medical History:  Diagnosis Date  . Arthritis    "qwhere; hands, feet, different body parts from time to time" (05/08/2016)  . Asthma   . Breast mass 07/03/2016  . [facility])    right breast  . Complication of anesthesia    "they used an adult tube going down my  throat; caused alot of problems; was told to always have pediatric tube placed" (05/08/2016)  . COPD (chronic obstructive pulmonary disease) ([place])   . GERD (gastroesophageal reflux disease)   . History of hiatal hernia   . History of radiation therapy 10/07/16- 11/17/16   Right Breast and regional nodes 50 Gy in 25 fractions to breast and at least 45 Gy in 25 fractions to nodes, Right Breast boost 10 Gy in 5 fractions.   . Hyperglycemia   . Hyperlipidemia   . Hypertension   . Hypothyroidism   . Lump of breast, right    "suppose to have it checked soon" (05/08/2016)  . Migraine    "none for years" (05/08/2016)  . Osteopenia   . Personal history of radiation therapy 2018  . Pneumonia "several times"  . Status post radioactive iodine thyroid ablation   . Thyroid disease     Tobacco History: Social History   Tobacco Use  Smoking Status Former Smoker  . Packs/day: 0.25  . Years: 25.00  . Pack years: 6.25  . Types: Cigarettes  . Last attempt to quit: 05/01/2018  . Years since quitting: 0.0  Smokeless Tobacco Never Used  Tobacco Comment   Using Nicotrol Inhaler from primary care   Counseling given: Yes Comment: Using Nicotrol Inhaler from primary care   Smoking assessment and cessation counseling  Patient currently smoking: Stopped smoking 3 weeks ago.  Estimated date of 05/01/2018 per patient. I have advised the patient to quit/stop smoking as soon as possible due to high risk for multiple medical problems.  It will also be very difficult for [place] to manage patient's  respiratory symptoms and status if we continue to expose her lungs to a known irritant.  We do not advise e-cigarettes as a form of stopping smoking.  Patient is willing to quit smoking.  Quit date estimated by patient of 05/01/2018.  I have advised the patient that we can assist and have options of nicotine replacement therapy, provided smoking cessation education today, provided smoking cessation counseling, and provided  cessation resources.  This is currently being managed by primary care.  Patient is using a Nicotrol inhaler.  Patient reports that this is been very helpful with stopping smoking.  She knows to follow-up with primary care or our office if she has a relapse in her smoking.  Follow-up next office visit office visit for assessment of smoking cessation.  Smoking cessation counseling advised for: 4 minutes   Outpatient Encounter Medications as of 05/28/2018  Medication Sig  . amLODipine (NORVASC) 5 MG tablet Take 10 mg by mouth daily before breakfast.   . anastrozole (ARIMIDEX) 1 MG tablet Take 0.5 tablets (0.5 mg total) by mouth daily.  . Cholecalciferol (VITAMIN D3) 2000 units capsule Take 2,000 Units by mouth every 14 (fourteen) days. Once every 2-3 weeks (when she remembers)  . DALIRESP 500 MCG TABS tablet TAKE 1 TABLET  BY MOUTH ONCE DAILY  . losartan (COZAAR) 100 MG tablet Take 50 mg by mouth daily before breakfast.   . meloxicam (MOBIC) 15 MG tablet Take 15 mg by mouth daily before breakfast.   . omeprazole (PRILOSEC) 20 MG capsule Take 20 mg by mouth daily before breakfast.   . Omeprazole-Sodium Bicarbonate (ZEGERID OTC) 20-1100 MG CAPS capsule Take 1 capsule by mouth daily before breakfast.  . pravastatin (PRAVACHOL) 20 MG tablet Take 40 mg by mouth daily before breakfast.   . PROAIR HFA 108 (90 Base) MCG/ACT inhaler INHALE 1 TO 2 PUFFS BY MOUTH EVERY 6 HOURS AS NEEDED FOR WHEEZING OR  SHORTNESS  OF  BREATH  . Probiotic Product ([place]) Take by mouth.  . TRELEGY ELLIPTA 100-62.5-25 MCG/INH AEPB INHALE 1 PUFF INTO THE LUNGS DAILY  . [DISCONTINUED] PROAIR HFA 108 (90 Base) MCG/ACT inhaler   . azithromycin (ZITHROMAX) 250 MG tablet 500mg  (two tablets) today, then 250mg  (1 tablet) for the next 4 days  . predniSONE (DELTASONE) 10 MG tablet 4 tabs for 2 days, then 3 tabs for 2 days, 2 tabs for 2 days, then 1 tab for 2 days, then stop   No facility-administered encounter  medications on file as of 05/28/2018.      Review of Systems  Review of Systems  Constitutional: Positive for fatigue. Negative for chills and fever.  HENT: Positive for congestion (clear congestion ). Negative for postnasal drip, sinus pressure and sinus pain.   Respiratory: Positive for cough, shortness of breath and wheezing.   Cardiovascular: Negative for chest pain and palpitations.  Gastrointestinal: Negative for diarrhea, nausea and vomiting.     Physical Exam  BP 118/78 (BP Location: Left Arm, Patient Position: Sitting, Cuff Size: Normal)   Pulse 87   Temp (!) 97.4 F (36.3 C)   Ht 5\' 4"  (1.626 m)   Wt 117 lb (53.1 kg)   SpO2 98%   BMI 20.08 kg/m   Wt Readings from Last 5 Encounters:  05/28/18 117 lb (53.1 kg)  01/04/18 117 lb 9.6 oz (53.3 kg)  12/07/17 118 lb 11.2 oz (53.8 kg)  07/06/17 119 lb 12.8 oz (54.3 kg)  06/16/17 122 lb 3.2 oz (55.4 kg)   Physical Exam  Constitutional: She is oriented to person, place, and time and well-developed, well-nourished, and in no distress. She has a sickly appearance. No distress.  Frail elderly female  HENT:  Head: Normocephalic and atraumatic.  Right Ear: Hearing and external ear normal.  Left Ear: Hearing and external ear normal.  Nose: Mucosal edema and rhinorrhea present. Right sinus exhibits no maxillary sinus tenderness and no frontal sinus tenderness. Left sinus exhibits no maxillary sinus tenderness and no frontal sinus tenderness.  Mouth/Throat: Uvula is midline and oropharynx is clear and moist. No oropharyngeal exudate.  Unable to visualize TMs bilaterally due to cerumen impaction bilaterally Postnasal drip  Eyes: Pupils are equal, round, and reactive to light.  Neck: Normal range of motion. Neck supple.  Cardiovascular: Normal rate, regular rhythm and normal heart sounds.  Pulmonary/Chest: Effort normal. No accessory muscle usage. No respiratory distress. She has no decreased breath sounds. She has wheezes (exp  wheeze). She has no rhonchi.  Musculoskeletal:        General: No edema.     Comments: Currently being evaluated at [facility] for ankle problems, patient was referred to [facility] but insurance does not cover treatments at [facility], potentially being worked up for an ankle replacement concerns regarding doing  surgery based off of her COPD  Lymphadenopathy:    She has no cervical adenopathy.  Neurological: She is alert and oriented to person, place, and time. Gait normal.  Skin: Skin is warm and dry. She is not diaphoretic. No erythema.  Psychiatric: Mood, memory, affect and judgment normal.  Nursing note and vitals reviewed.    Lab Results:  CBC    Component Value Date/Time   WBC 8.4 08/15/2016 0605   RBC 4.30 08/15/2016 0605   HGB 12.4 08/15/2016 0605   HCT 39.3 08/15/2016 0605   PLT 256 08/15/2016 0605   MCV 91.4 08/15/2016 0605   MCH 28.8 08/15/2016 0605   MCHC 31.6 08/15/2016 0605   RDW 14.1 08/15/2016 0605   LYMPHSABS 1.0 05/08/2016 1650   MONOABS 0.7 05/08/2016 1650   EOSABS 0.0 05/08/2016 1650   BASOSABS 0.0 05/08/2016 1650    BMET    Component Value Date/Time   NA 135 08/15/2016 0605   K 4.3 08/15/2016 0605   CL 102 08/15/2016 0605   CO2 27 08/15/2016 0605   GLUCOSE 97 08/15/2016 0605   BUN 15 08/15/2016 0605   CREATININE 1.11 (H) 08/15/2016 0605   CALCIUM 8.9 08/15/2016 0605   GFRNONAA 49 (L) 08/15/2016 0605   GFRAA 57 (L) 08/15/2016 0605    BNP No results found for: BNP  ProBNP No results found for: PROBNP    Assessment & Plan:     COPD, group D, by GOLD 2017 classification [facility]) Assessment: 2017 pulmonary function test showing COPD Gold 3 mMRC 4 today 2018 CT chest showing moderate emphysema Recently stop smoking on 05/01/2018 Currently using Nicotrol inhaler Maintained on Trelegy Ellipta Patient endorses 10 days of symptoms with progressive fatigue, worsening shortness of breath, and increased mucus production Expiratory wheeze on exam  today  Plan: Z-Pak today Prednisone taper today Continue Trelegy Ellipta Can use rescue inhaler as needed Continue Nicotrol Inhaler Continue to not smoke Emphasized the importance the patient that she needs to follow-up with our office sooner when she is having onset of symptoms due to the severity of her COPD and recent smoking history Patient to resume gym activities and increase daily exercise >>>If patient is unable to maintain going to the gym regularly could consider pulmonary rehab referral in the future Patient to follow-up in 4 to 6 weeks for follow-up with Dr. Vaughan Browner or Wyn Quaker, FNP  Former smoker Assessment: Stop smoking on 05/01/2018 Patient is working with primary care with management of a Nicotrol inhaler Currently in a COPD exacerbation today mMRC 4 today  Plan: Continue to not smoke Follow-up with primary care or our office if you have a relapse in your smoking Continue Nicotrol Inhaler under guidance of primary care     Lauraine Rinne, NP 05/28/2018   This appointment was 26 min long with over 50% of the time in direct face-to-face patient care, assessment, plan of care, and follow-up.

## 2018-06-25 ENCOUNTER — Ambulatory Visit: Payer: PPO | Admitting: Pulmonary Disease

## 2018-07-15 ENCOUNTER — Telehealth: Payer: Self-pay | Admitting: Pulmonary Disease

## 2018-07-15 MED ORDER — FLUTICASONE-UMECLIDIN-VILANT 100-62.5-25 MCG/INH IN AEPB: 1.0000 | INHALATION_SPRAY | Freq: Every day | RESPIRATORY_TRACT | 5 refills | Status: DC

## 2018-07-15 NOTE — Telephone Encounter (Signed)
Rx Trelegy sent to pt's preferred pharmacy. attempted to call pt to let her know this had been done but unable to reach pt. Left a detailed message for pt letting her know this had been done. Nothing further needed.

## 2018-08-16 DIAGNOSIS — M19071 Primary osteoarthritis, right ankle and foot: Secondary | ICD-10-CM | POA: Diagnosis not present

## 2018-08-18 ENCOUNTER — Other Ambulatory Visit: Payer: Self-pay | Admitting: *Deleted

## 2018-08-18 ENCOUNTER — Other Ambulatory Visit: Payer: Self-pay | Admitting: Hematology and Oncology

## 2018-08-18 DIAGNOSIS — C50411 Malignant neoplasm of upper-outer quadrant of right female breast: Secondary | ICD-10-CM

## 2018-08-18 DIAGNOSIS — Z853 Personal history of malignant neoplasm of breast: Secondary | ICD-10-CM

## 2018-08-18 MED ORDER — OXYCODONE-ACETAMINOPHEN 5-325 MG PO TABS: 1.0000 | ORAL_TABLET | Freq: Once | ORAL | 0 refills | Status: AC

## 2018-08-20 ENCOUNTER — Other Ambulatory Visit: Payer: Self-pay | Admitting: Hematology and Oncology

## 2018-08-20 DIAGNOSIS — Z853 Personal history of malignant neoplasm of breast: Secondary | ICD-10-CM

## 2018-08-25 ENCOUNTER — Other Ambulatory Visit: Payer: Self-pay | Admitting: Hematology and Oncology

## 2018-09-06 ENCOUNTER — Telehealth: Payer: Self-pay | Admitting: *Deleted

## 2018-09-06 ENCOUNTER — Other Ambulatory Visit: Payer: Self-pay | Admitting: Hematology and Oncology

## 2018-09-06 MED ORDER — OXYCODONE-ACETAMINOPHEN 5-325 MG PO TABS: 1.0000 | ORAL_TABLET | ORAL | 0 refills | Status: DC | PRN

## 2018-09-06 NOTE — Telephone Encounter (Signed)
Received call from pt requesting one hydrocodone tablet to help with mammogram pain.  Pt also stated she has stopped taking her Anastrozole on August 30 2018 due to bone pain.  Pt states she is going to need an ankle replacement and has been experiencing increased joint pain in her ankle and hips and thinks it is due to her Anastrozole.  Pt states she will call next week to let our office know joint pain resolves.

## 2018-09-09 ENCOUNTER — Ambulatory Visit
Admission: RE | Admit: 2018-09-09 | Discharge: 2018-09-09 | Disposition: A | Discharge status: Home / Self Care | Payer: PPO | Source: Ambulatory Visit | Attending: Hematology and Oncology | Admitting: Hematology and Oncology

## 2018-09-09 ENCOUNTER — Other Ambulatory Visit: Payer: Self-pay

## 2018-09-09 ENCOUNTER — Telehealth: Payer: Self-pay | Admitting: Pulmonary Disease

## 2018-09-09 DIAGNOSIS — N6489 Other specified disorders of breast: Secondary | ICD-10-CM | POA: Diagnosis not present

## 2018-09-09 DIAGNOSIS — Z853 Personal history of malignant neoplasm of breast: Secondary | ICD-10-CM

## 2018-09-09 DIAGNOSIS — R922 Inconclusive mammogram: Secondary | ICD-10-CM | POA: Diagnosis not present

## 2018-09-09 NOTE — Telephone Encounter (Signed)
Called the patient back and she stated the shortness of breath and cough started about 3 days ago. Thinks it may be another COPD flare. Confirmed she has been using her ProAir, Trelegy and Daliresp daily. Patient stated the cough started out dry and now is starting to become a wet cough, but still not productive. Patient said she is feeling worse since the humidity is so high. Thinks that may have something to do with the flare she is having.  Patient said it feels exactly like the COPD flare she had the last time we saw her in March (actual appointment was 05/28/18). Patient asked if she could see Wyn Quaker, NP again since he was the provider she saw for her last visit.  Patient set up for 09/10/18 televisit. Offered the patient a video visit today, but she stated she was not set up at home for one. Offered televisit today, but she already has a doctors appointment around 2:00 this afternoon. Said tomorrow will work better for her.

## 2018-09-10 ENCOUNTER — Encounter: Payer: Self-pay | Admitting: Pulmonary Disease

## 2018-09-10 ENCOUNTER — Other Ambulatory Visit: Payer: Self-pay

## 2018-09-10 ENCOUNTER — Ambulatory Visit (INDEPENDENT_AMBULATORY_CARE_PROVIDER_SITE_OTHER): Payer: PPO | Admitting: Pulmonary Disease

## 2018-09-10 DIAGNOSIS — Z87891 Personal history of nicotine dependence: Secondary | ICD-10-CM

## 2018-09-10 DIAGNOSIS — J449 Chronic obstructive pulmonary disease, unspecified: Secondary | ICD-10-CM

## 2018-09-10 MED ORDER — AMOXICILLIN-POT CLAVULANATE 875-125 MG PO TABS: 1.0000 | ORAL_TABLET | Freq: Two times a day (BID) | ORAL | 0 refills | Status: DC

## 2018-09-10 MED ORDER — PREDNISONE 10 MG PO TABS: ORAL_TABLET | ORAL | 0 refills | Status: DC

## 2018-09-10 NOTE — Assessment & Plan Note (Signed)
Assessment: 2017 pulmonary function test shows COPD Gold 3 mMRC 4 today 2018 CT chest shows moderate emphysema Stop smoking in February/2020, continues to not smoke Maintained on Trelegy Ellipta Using rescue inhaler 3 times daily for the last week Patient reporting 1 week of symptoms of progressive fatigue, worsening shortness of breath and increased mucus production and cough, she reports the cough is typically though dry  Plan: Offered SARS-CoV-2 outpatient testing today, patient declined Augmentin today Prednisone taper today, extended in comparison to February/2020 prednisone taper Continue Trelegy Ellipta Continue rescue inhaler as needed Continue Nicotrol Inhaler if needed Continue to not smoke Patient will need to have in office visit in 4 to 6 weeks to further evaluate COPD

## 2018-09-10 NOTE — Patient Instructions (Addendum)
Offered drive-through testing versus COVID-19, you declined this today  Augmentin >>> Take 1 875-125 mg tablet every 12 hours for the next 10 days >>> Take with food  Prednisone 10mg  tablet  >>>4 tabs for 3 days, then 3 tabs for 3 days, 2 tabs for 3 days, then 1 tab for 3 days, then stop >>>take with food  >>>take in the morning   Continue Trelegy Ellipta  >>> 1 puff daily in the morning >>>rinse mouth out after use  >>> This inhaler contains 3 medications that help manage her respiratory status, contact our office if you cannot afford this medication or unable to remain on this medication  Only use your albuterol as a rescue medication to be used if you can't catch your breath by resting or doing a relaxed purse lip breathing pattern.  - The less you use it, the better it will work when you need it. - Ok to use up to 2 puffs every 4 hours if you must but call for immediate appointment if use goes up over your usual need - Don't leave home without it !! (think of it like the spare tire for your car)    Note your daily symptoms >remember "red flags" for COPD:  >>>Increase in cough >>>increase in sputum production >>>increase in shortness of breath or activity  intolerance.   If you notice these symptoms, please call the office to be seen.    Great job on continuing to not smoke!  Smoking Cessation Resources:  1 800 QUIT NOW  >>> Patient to call this resource and utilize it to help support her quit smoking >>> Keep up your hard work with stopping smoking  You can also contact the [facility] >>>For smoking cessation classes call [phone]  We do not recommend using e-cigarettes as a form of stopping smoking  You can sign up for smoking cessation support texts and information:  >>>https://smokefree.gov/smokefreetxt  Continue Nicotrol Inhaler as prescribed by primary care  Keep up the hard work with stopping smoking   Return in about 6  weeks (around 10/22/2018), or if symptoms worsen or fail to improve, for Follow up with Dr. Vaughan Browner.     Coronavirus (COVID-19) Are you at risk?  Are you at risk for the Coronavirus (COVID-19)?  To be considered HIGH RISK for Coronavirus (COVID-19), you have to meet the following criteria:  . Traveled to [place], [place], [place], [place] or [place]; or in the [place] to [place], [place], [place], or [place]; and have fever, cough, and shortness of breath within the last 2 weeks of travel OR . Been in close contact with a person diagnosed with COVID-19 within the last 2 weeks and have fever, cough, and shortness of breath . IF YOU DO NOT MEET THESE CRITERIA, YOU ARE CONSIDERED LOW RISK FOR COVID-19.  What to do if you are HIGH RISK for COVID-19?  Marland Kitchen If you are having a medical emergency, call 911. . Seek medical care right away. Before you go to a doctor's office, urgent care or emergency department, call ahead and tell them about your recent travel, contact with someone diagnosed with COVID-19, and your symptoms. You should receive instructions from your physician's office regarding next steps of care.  . When you arrive at healthcare provider, tell the healthcare staff immediately you have returned from visiting [place], [place], [place], [place] or [place]; or traveled in the [place] to [place], [place], [place], or [place]; in  the last two weeks or you have been in close contact with a person diagnosed with COVID-19 in the last 2 weeks.   . Tell the health care staff about your symptoms: fever, cough and shortness of breath. . After you have been seen by a medical provider, you will be either: o Tested for (COVID-19) and discharged home on quarantine except to seek medical care if symptoms worsen, and asked to  - Stay home and avoid contact with others until you get your results (4-5 days)  - Avoid travel on public transportation if possible (such as bus, train, or  airplane) or o Sent to the Emergency Department by EMS for evaluation, COVID-19 testing, and possible admission depending on your condition and test results.  What to do if you are LOW RISK for COVID-19?  Reduce your risk of any infection by using the same precautions used for avoiding the common cold or flu:  Marland Kitchen Wash your hands often with soap and warm water for at least 20 seconds.  If soap and water are not readily available, use an alcohol-based hand sanitizer with at least 60% alcohol.  . If coughing or sneezing, cover your mouth and nose by coughing or sneezing into the elbow areas of your shirt or coat, into a tissue or into your sleeve (not your hands). . Avoid shaking hands with others and consider head nods or verbal greetings only. . Avoid touching your eyes, nose, or mouth with unwashed hands.  . Avoid close contact with people who are sick. . Avoid places or events with large numbers of people in one location, like concerts or sporting events. . Carefully consider travel plans you have or are making. . If you are planning any travel outside or inside the [place], visit the CDC's Travelers' Health webpage for the latest health notices. . If you have some symptoms but not all symptoms, continue to monitor at home and seek medical attention if your symptoms worsen. . If you are having a medical emergency, call 911.   [place] / e-Visit: [email or web address]         [facility]: [facility]: [phone]                   [facility]: [phone]           It is flu season:   >>> Best ways to protect herself from the flu: Receive the yearly flu vaccine, practice good hand hygiene washing with soap and also using hand sanitizer when available, eat a nutritious meals, get adequate rest, hydrate appropriately   Please contact  the office if your symptoms worsen or you have concerns that you are not improving.   Thank you for choosing [facility] for your healthcare, and for allowing [place] to partner with you on your healthcare journey. I am thankful to be able to provide care to you today.   Wyn Quaker FNP-C     Chronic Obstructive Pulmonary Disease Exacerbation Chronic obstructive pulmonary disease (COPD) is a long-term (chronic) lung problem. In COPD, the flow of air from the lungs is limited. COPD exacerbations are times that breathing gets worse and you need more than your normal treatment. Without treatment, they can be life threatening. If they happen often, your lungs can become more damaged. If your COPD gets worse, your doctor may treat you with:  Medicines.  Oxygen.  Different ways to  clear your airway, such as using a mask. Follow these instructions at home: Medicines  Take over-the-counter and prescription medicines only as told by your doctor.  If you take an antibiotic or steroid medicine, do not stop taking the medicine even if you start to feel better.  Keep up with shots (vaccinations) as told by your doctor. Be sure to get a yearly (annual) flu shot. Lifestyle  Do not smoke. If you need help quitting, ask your doctor.  Eat healthy foods.  Exercise regularly.  Get plenty of sleep.  Avoid tobacco smoke and other things that can bother your lungs.  Wash your hands often with soap and water. This will help keep you from getting an infection. If you cannot use soap and water, use hand sanitizer.  During flu season, avoid areas that are crowded with people. General instructions  Drink enough fluid to keep your pee (urine) clear or pale yellow. Do not do this if your doctor has told you not to.  Use a cool mist machine (vaporizer).  If you use oxygen or a machine that turns medicine into a mist (nebulizer), continue to use it as told.  Follow all instructions for  rehabilitation. These are steps you can take to make your body work better.  Keep all follow-up visits as told by your doctor. This is important. Contact a doctor if:  Your COPD symptoms get worse than normal. Get help right away if:  You are short of breath and it gets worse.  You have trouble talking.  You have chest pain.  You cough up blood.  You have a fever.  You keep throwing up (vomiting).  You feel weak or you pass out (faint).  You feel confused.  You are not able to sleep because of your symptoms.  You are not able to do daily activities. Summary  COPD exacerbations are times that breathing gets worse and you need more treatment than normal.  COPD exacerbations can be very serious and may cause your lungs to become more damaged.  Do not smoke. If you need help quitting, ask your doctor.  Stay up-to-date on your shots. Get a flu shot every year. This information is not intended to replace advice given to you by your health care provider. Make sure you discuss any questions you have with your health care provider. Document Released: 03/06/2011 Document Revised: 04/21/2016 Document Reviewed: 04/21/2016 Elsevier Interactive Patient Education  2019 [employer].

## 2018-09-10 NOTE — Progress Notes (Signed)
Virtual Visit via Telephone Note  I connected with Linda [name] on 09/10/18 at  2:30 PM EDT by telephone and verified that I am speaking with the correct person using two identifiers.  Location: Patient: Home Provider: [facility] - [address], [place], [place], [place] [address]   I discussed the limitations, risks, security and privacy concerns of performing an evaluation and management service by telephone and the availability of in person appointments. I also discussed with the patient that there may be a patient responsible charge related to this service. The patient expressed understanding and agreed to proceed.  Patient consented to consult via telephone: Yes People present and their role in pt care: Pt     History of Present Illness: 73 year old female current every day smoker followed in our office for COPD Gold III D  PMH: Breast cancer (diagnosed in April/2018 underwent lumpectomy and XRT, patient declined chemotherapy) Smoker/ Smoking History: Former Smoker.  Maintenance: Trelegy Ellipta Pt of: Dr. Vaughan Browner  Chief complaint: Increased shortness of breath, wheezing, cough   73 year old female former smoker followed in our office for COPD Gold 3D.  Patient reporting that she feels very similar to how she felt in February/2020 when she was treated as a COPD exacerbation.  She reports that she got somewhat better after that treatment but still remained fatigued.  She reports 1 week ago though her cough worsened, has had increased wheezing, and increased shortness of breath.  Patient reporting it feels like she is in a COPD exacerbation.  She reports the cough has increased and it is mostly dry.  She is wheezing.  She has increased her rescue inhaler use from 1 time daily to 3 times daily over the last week.  Patient continues to not smoke.  She reports that she has had no known sick contacts.  She has ran errands to get groceries but wears a mask and washes  her hands.  MMRC - Breathlessness Score 4 - I am too breathless to leave the house or I am breathlessness when dressing   Observations/Objective:  Data Reviewed: PFTs  02/18/11 FVC 2.42 [117%), FEV1 1.46 (69%), F/F 46, TLC 112%, DLCO 69% Moderate obstructive defect, mild reduction in diffusion capacity.  08/23/15 FVC 2.14 [71%], FEV1 0.94 [41%], F/F 44, TLC 114%, DLCO 57% Severe obstructive defect with moderate reduction in diffusion capacity  CT chest 02/26/15- mild left lower lobe atelectasis, moderate emphysematous changes.  CT chest 08/01/16- moderate emphysematous changes.  No suspicious lung nodules or mass.  Chronic scarring in the right lower lobe.   Assessment and Plan:  COPD, group D, by GOLD 2017 classification [facility]) Assessment: 2017 pulmonary function test shows COPD Gold 3 mMRC 4 today 2018 CT chest shows moderate emphysema Stop smoking in February/2020, continues to not smoke Maintained on Trelegy Ellipta Using rescue inhaler 3 times daily for the last week Patient reporting 1 week of symptoms of progressive fatigue, worsening shortness of breath and increased mucus production and cough, she reports the cough is typically though dry  Plan: Offered SARS-CoV-2 outpatient testing today, patient declined Augmentin today Prednisone taper today, extended in comparison to February/2020 prednisone taper Continue Trelegy Ellipta Continue rescue inhaler as needed Continue Nicotrol Inhaler if needed Continue to not smoke Patient will need to have in office visit in 4 to 6 weeks to further evaluate COPD  Former smoker Assessment: Stop smoking February/2020 Patient continues to not smoke mMRC 4 today  Plan: Continue to not smoke [employer] job  stopping!   Follow Up Instructions:  Return in about 6 weeks (around 10/22/2018), or if symptoms worsen or fail to improve, for Follow up with Dr. Vaughan Browner.   I discussed the assessment and treatment plan with the patient. The  patient was provided an opportunity to ask questions and all were answered. The patient agreed with the plan and demonstrated an understanding of the instructions.   The patient was advised to call back or seek an in-person evaluation if the symptoms worsen or if the condition fails to improve as anticipated.  I provided 22 minutes of non-face-to-face time during this encounter.   Lauraine Rinne, NP

## 2018-09-10 NOTE — Assessment & Plan Note (Signed)
Assessment: Stop smoking February/2020 Patient continues to not smoke mMRC 4 today  Plan: Continue to not smoke [employer] job stopping!

## 2018-10-05 ENCOUNTER — Encounter: Payer: Self-pay | Admitting: Pulmonary Disease

## 2018-10-05 ENCOUNTER — Other Ambulatory Visit: Payer: Self-pay

## 2018-10-05 ENCOUNTER — Ambulatory Visit: Payer: PPO | Admitting: Pulmonary Disease

## 2018-10-05 VITALS — BP 134/80 | HR 93 | Temp 98.2°F | Ht 64.0 in | Wt 124.2 lb

## 2018-10-05 DIAGNOSIS — J449 Chronic obstructive pulmonary disease, unspecified: Secondary | ICD-10-CM

## 2018-10-05 MED ORDER — AZITHROMYCIN 250 MG PO TABS: 250.0000 mg | ORAL_TABLET | Freq: Every day | ORAL | 3 refills | Status: DC

## 2018-10-05 MED ORDER — IPRATROPIUM-ALBUTEROL 0.5-2.5 (3) MG/3ML IN SOLN: 3.0000 mL | RESPIRATORY_TRACT | 11 refills | Status: DC | PRN

## 2018-10-05 NOTE — Progress Notes (Signed)
Linda [name]    [id number]    [birth year]  Primary Care Physician:Prochnau, Chrys Racer, MD  Referring Physician: Ernestene Kiel, MD [address] [place],  [place] [address]  Chief complaint:   Follow up for  COPD GOLD D  HPI: Linda [name] is a 73 year-old with COPD (GOLD D, CAT score 25, multiple exacerbation over past year, FEV1 41%). Maintained on Spiriva and Symbicort. His symptoms have been stable until this year when she has several exacerbations over the winter requiring courses of antibiotics and steroids. Her primary care physician has referred her back to [place] for further management. Her chief complaint is dyspnea on exertion, loss of energy, fatigue,  chronic cough with sputum production.   She's had a diagnosis of breast cancer in April 2018 and underwent lumpectomy and XRT. She declined chemotherapy.  Interim History: Has not been feeling well since February 2020.  She has received 2 courses of prolonged steroid therapy, azithromycin with no change in symptoms C/O chronic dyspnea on exertion cough with white mucus.  Denies any fevers, chills.  Using her rescue medication several times a day. Stopped taking Daliresp due to cost of medication. She declined COVID testing Stopped smoking in January 2020.  She is dealing with chronic ankle osteoarthritis which is limiting her mobility and she is unable to exercise.  Outpatient Encounter Medications as of 10/05/2018  Medication Sig  . amLODipine (NORVASC) 5 MG tablet Take 10 mg by mouth daily before breakfast.   . Cholecalciferol (VITAMIN D3) 2000 units capsule Take 2,000 Units by mouth every 14 (fourteen) days. Once every 2-3 weeks (when she remembers)  . Fluticasone-Umeclidin-Vilant (TRELEGY ELLIPTA) 100-62.5-25 MCG/INH AEPB Take 1 puff by mouth daily.  Marland Kitchen losartan (COZAAR) 100 MG tablet Take 50 mg by mouth daily before breakfast.   . meloxicam (MOBIC) 15 MG tablet Take 15 mg by mouth daily before breakfast.   .  omeprazole (PRILOSEC) 20 MG capsule Take 20 mg by mouth daily before breakfast.   . Omeprazole-Sodium Bicarbonate (ZEGERID OTC) 20-1100 MG CAPS capsule Take 1 capsule by mouth daily before breakfast.  . oxyCODONE-acetaminophen (PERCOCET/ROXICET) 5-325 MG tablet Take 1 tablet by mouth every 4 (four) hours as needed for severe pain (Before and after mammogram).  . pravastatin (PRAVACHOL) 20 MG tablet Take 40 mg by mouth daily before breakfast.   . PROAIR HFA 108 (90 Base) MCG/ACT inhaler INHALE 1 TO 2 PUFFS BY MOUTH EVERY 6 HOURS AS NEEDED FOR WHEEZING OR  SHORTNESS  OF  BREATH  . Probiotic Product ([place]) Take by mouth.  Marland Kitchen anastrozole (ARIMIDEX) 1 MG tablet Take 0.5 tablets (0.5 mg total) by mouth daily. (Patient not taking: Reported on 10/05/2018)  . DALIRESP 500 MCG TABS tablet TAKE 1 TABLET BY MOUTH ONCE DAILY (Patient not taking: Reported on 10/05/2018)  . [DISCONTINUED] amoxicillin-clavulanate (AUGMENTIN) 875-125 MG tablet Take 1 tablet by mouth 2 (two) times daily.  . [DISCONTINUED] azithromycin (ZITHROMAX) 250 MG tablet 500mg  (two tablets) today, then 250mg  (1 tablet) for the next 4 days  . [DISCONTINUED] predniSONE (DELTASONE) 10 MG tablet 4 tabs for 3 days, then 3 tabs for 3 days, 2 tabs for 3 days, then 1 tab for 3 days, then stop   No facility-administered encounter medications on file as of 10/05/2018.    Physical Exam: Blood pressure 134/80, pulse 93, temperature 98.2 F (36.8 C), temperature source Oral, height 5\' 4"  (1.626 m), weight 124 lb 3.2 oz (56.3  kg), SpO2 94 %. Gen:      No acute distress HEENT:  EOMI, sclera anicteric Neck:     No masses; no thyromegaly Lungs:    Clear to auscultation bilaterally; normal respiratory effort CV:         Regular rate and rhythm; no murmurs Abd:      + bowel sounds; soft, non-tender; no palpable masses, no distension Ext:    No edema; adequate peripheral perfusion Skin:      Warm and dry; no rash Neuro: alert and oriented x  3 Psych: normal mood and affect  Data Reviewed: Imaging CT chest 02/26/15- mild left lower lobe atelectasis, moderate emphysematous changes.  CT chest 08/01/16- moderate emphysematous changes.  No suspicious lung nodules or mass.  Chronic scarring in the right lower lobe. I have reviewed the images personally  PFTs  02/18/11 FVC 2.42 [117%), FEV1 1.46 (69%), F/F 46, TLC 112%, DLCO 69% Moderate obstructive defect, mild reduction in diffusion capacity.  08/23/15 FVC 2.14 [71%], FEV1 0.94 [41%], F/F 44, TLC 114%, DLCO 57% Severe obstructive defect with moderate reduction in diffusion capacity  Assessment:   Severe COPD. GOLD stage D with worsening sympotoms Continue trelegy inhaler Able to afford Daliresp Start azithromycin to 250 mg daily for anti-inflammatory effect Check O2 levels on exertion Continue albuterol rescue inhaler Start duo nebs 2-4 times daily Chest x-ray today. Referral for pulmonary rehab.  I am not sure if the rehab program has started yet but she will benefit from exercise and breathing training.  Health maintenance 9/40/19 influenza 01/12/2014-Prevnar 04/01/2011-Pneumovax  Plan/Recommendations: - Continue daliresp - Start daily azithromycin - Albuterol, duo nebs - Chest x-ray - Pulmonary rehab  Marshell Garfinkel MD [facility] 10/05/2018, 3:04 PM  CC: Ernestene Kiel, MD

## 2018-10-05 NOTE — Addendum Note (Signed)
Addended by: Hildred Alamin I on: 10/05/2018 04:17 PM   Modules accepted: Orders

## 2018-10-05 NOTE — Patient Instructions (Addendum)
I am sorry you are not feeling well We will check your oxygen levels on exertion daily Continue Trelegy inhaler Will start duo nebs 2-3 times a day as needed We will start azithromycin to 250 mg a day Chest x ray today  Check some labs today including CBC with differential, IgE, alpha-1 antitrypsin levels and phenotype We will refer you for pulmonary rehab  Follow-up in 2 to 4 weeks.

## 2018-10-06 LAB — CBC WITH DIFFERENTIAL/PLATELET
Basophils Absolute: 0.1 10*3/uL (ref 0.0–0.1)
Basophils Relative: 1 % (ref 0.0–3.0)
Eosinophils Absolute: 0.2 10*3/uL (ref 0.0–0.7)
Eosinophils Relative: 3.2 % (ref 0.0–5.0)
HCT: 41.5 % (ref 36.0–46.0)
Hemoglobin: 13.7 g/dL (ref 12.0–15.0)
Lymphocytes Relative: 21.4 % (ref 12.0–46.0)
Lymphs Abs: 1.2 10*3/uL (ref 0.7–4.0)
MCHC: 33.1 g/dL (ref 30.0–36.0)
MCV: 89.5 fl (ref 78.0–100.0)
Monocytes Absolute: 0.6 10*3/uL (ref 0.1–1.0)
Monocytes Relative: 10.1 % (ref 3.0–12.0)
Neutro Abs: 3.7 10*3/uL (ref 1.4–7.7)
Neutrophils Relative %: 64.3 % (ref 43.0–77.0)
Platelets: 308 10*3/uL (ref 150.0–400.0)
RBC: 4.64 Mil/uL (ref 3.87–5.11)
RDW: 13.6 % (ref 11.5–15.5)
WBC: 5.7 10*3/uL (ref 4.0–10.5)

## 2018-10-08 ENCOUNTER — Telehealth (HOSPITAL_COMMUNITY): Payer: Self-pay | Admitting: *Deleted

## 2018-10-08 NOTE — Telephone Encounter (Signed)
Left message for pt regarding referral received for pulmonary rehab by Dr. Vaughan Browner for COPD stage 3.  Requested call back for more information on our exercise program as well as we are holding on scheduling pulmonary pt for group exercise due to continued concerns for Covid - 19 with vulnerable and high risk population. Cherre Huger, BSN Cardiac and Training and development officer

## 2018-10-09 LAB — ALPHA-1 ANTITRYPSIN PHENOTYPE: A-1 Antitrypsin, Ser: 205 mg/dL — ABNORMAL HIGH (ref 83–199)

## 2018-10-09 LAB — IGE: IgE (Immunoglobulin E), Serum: 36 kU/L (ref <=114)

## 2018-10-12 ENCOUNTER — Telehealth (HOSPITAL_COMMUNITY): Payer: Self-pay | Admitting: *Deleted

## 2018-10-12 NOTE — Telephone Encounter (Signed)
Received call back from message left. Spoke with pt and made her aware that we are continuing to hold on scheduling pulmonary rehab.  Pt will receive a packet of information with exercises with bands she can do in the interim and nutrition handouts. Pt appr Cherre Huger, BSN Cardiac and Pulmonary Rehab Nurse Navigator

## 2018-10-14 DIAGNOSIS — M25552 Pain in left hip: Secondary | ICD-10-CM | POA: Diagnosis not present

## 2018-10-28 ENCOUNTER — Ambulatory Visit: Payer: PPO | Admitting: Pulmonary Disease

## 2018-10-28 ENCOUNTER — Encounter: Payer: Self-pay | Admitting: Pulmonary Disease

## 2018-10-28 ENCOUNTER — Telehealth (HOSPITAL_COMMUNITY): Payer: Self-pay

## 2018-10-28 ENCOUNTER — Other Ambulatory Visit: Payer: Self-pay

## 2018-10-28 VITALS — BP 126/74 | HR 80 | Temp 98.2°F | Ht 64.0 in | Wt 127.4 lb

## 2018-10-28 DIAGNOSIS — J449 Chronic obstructive pulmonary disease, unspecified: Secondary | ICD-10-CM

## 2018-10-28 NOTE — Progress Notes (Signed)
Linda [name]    [id number]    1945-10-22  Primary Care Physician:Prochnau, Chrys Racer, MD  Referring Physician: Ernestene Kiel, MD [address] [place],  [place] [address]  Chief complaint:   Follow up for  COPD GOLD D  HPI: Linda [name] is a 73 year-old with COPD (GOLD D, CAT score 25, multiple exacerbation over past year, FEV1 41%). Maintained on Spiriva and Symbicort. His symptoms have been stable until this year when she has several exacerbations over the winter requiring courses of antibiotics and steroids. Her primary care physician has referred her back to [place] for further management. Her chief complaint is dyspnea on exertion, loss of energy, fatigue,  chronic cough with sputum production.   She's had a diagnosis of breast cancer in April 2018 and underwent lumpectomy and XRT. She declined chemotherapy. Stopped smoking in January 2020.  Interim History: She is admitted with recurrent exacerbations since February 2020 requiring multiple courses of antibiotics and steroids Started on daily azithromycin at last visit and nebulizer therapy Feels that this has helped her with her breathing.  Still has chronic dyspnea on exertion  She is dealing with chronic ankle osteoarthritis which is limiting her mobility and she is unable to exercise.  Outpatient Encounter Medications as of 10/28/2018  Medication Sig  . amLODipine (NORVASC) 5 MG tablet Take 10 mg by mouth daily before breakfast.   . azithromycin (ZITHROMAX) 250 MG tablet Take 1 tablet (250 mg total) by mouth daily.  . Cholecalciferol (VITAMIN D3) 2000 units capsule Take 2,000 Units by mouth every 14 (fourteen) days. Once every 2-3 weeks (when she remembers)  . Fluticasone-Umeclidin-Vilant (TRELEGY ELLIPTA) 100-62.5-25 MCG/INH AEPB Take 1 puff by mouth daily.  Marland Kitchen ipratropium-albuterol (DUONEB) 0.5-2.5 (3) MG/3ML SOLN Take 3 mLs by nebulization every 4 (four) hours as needed.  Marland Kitchen losartan (COZAAR) 100 MG tablet Take  50 mg by mouth daily before breakfast.   . meloxicam (MOBIC) 15 MG tablet Take 15 mg by mouth daily before breakfast.   . omeprazole (PRILOSEC) 20 MG capsule Take 20 mg by mouth daily before breakfast.   . Omeprazole-Sodium Bicarbonate (ZEGERID OTC) 20-1100 MG CAPS capsule Take 1 capsule by mouth daily before breakfast.  . pravastatin (PRAVACHOL) 20 MG tablet Take 40 mg by mouth daily before breakfast.   . PROAIR HFA 108 (90 Base) MCG/ACT inhaler INHALE 1 TO 2 PUFFS BY MOUTH EVERY 6 HOURS AS NEEDED FOR WHEEZING OR  SHORTNESS  OF  BREATH  . Probiotic Product ([place]) Take by mouth.  Marland Kitchen anastrozole (ARIMIDEX) 1 MG tablet Take 0.5 tablets (0.5 mg total) by mouth daily. (Patient not taking: Reported on 10/05/2018)  . DALIRESP 500 MCG TABS tablet TAKE 1 TABLET BY MOUTH ONCE DAILY (Patient not taking: Reported on 10/05/2018)  . oxyCODONE-acetaminophen (PERCOCET/ROXICET) 5-325 MG tablet Take 1 tablet by mouth every 4 (four) hours as needed for severe pain (Before and after mammogram). (Patient not taking: Reported on 10/28/2018)   No facility-administered encounter medications on file as of 10/28/2018.    Physical Exam: Blood pressure 126/74, pulse 80, temperature 98.2 F (36.8 C), temperature source Oral, height 5\' 4"  (1.626 m), weight 127 lb 6.4 oz (57.8 kg), SpO2 98 %. Gen:      No acute distress HEENT:  EOMI, sclera anicteric Neck:     No masses; no thyromegaly Lungs:    Clear to auscultation bilaterally; normal respiratory effort CV:  Regular rate and rhythm; no murmurs Abd:      + bowel sounds; soft, non-tender; no palpable masses, no distension Ext:    No edema; adequate peripheral perfusion Skin:      Warm and dry; no rash Neuro: alert and oriented x 3 Psych: normal mood and affect  Data Reviewed: Imaging CT chest 02/26/15- mild left lower lobe atelectasis, moderate emphysematous changes.  CT chest 08/01/16- moderate emphysematous changes.  No suspicious lung nodules  or mass.  Chronic scarring in the right lower lobe. I have reviewed the images personally  PFTs  02/18/11 FVC 2.42 [117%), FEV1 1.46 (69%), F/F 46, TLC 112%, DLCO 69% Moderate obstructive defect, mild reduction in diffusion capacity.  08/23/15 FVC 2.14 [71%], FEV1 0.94 [41%], F/F 44, TLC 114%, DLCO 57% Severe obstructive defect with moderate reduction in diffusion capacity  Assessment:   Severe COPD. GOLD stage D Continue trelegy inhaler Able to afford Daliresp Continue azithromycin to 250 mg daily for anti-inflammatory effect Albuterol rescue inhaler, duo nebs 2-4 times daily.  Health maintenance 9/40/19 influenza 01/12/2014-Prevnar 04/01/2011-Pneumovax  Plan/Recommendations: - Continue daliresp - Daily azithromycin - Albuterol, duo nebs  Marshell Garfinkel MD [facility] 10/28/2018, 2:11 PM  CC: Ernestene Kiel, MD

## 2018-10-28 NOTE — Patient Instructions (Signed)
I am glad you are doing well with your breathing.  Continue your medications as prescribed I will follow back in 3 months

## 2018-10-28 NOTE — Telephone Encounter (Signed)
Pt insurance is active and benefits verified through [employer] Co-pay $10.00, DED 0/0 met, out of pocket $3,100/$131.73 met, co-insurance 0. no pre-authorization required., REF# [id number]

## 2018-11-01 ENCOUNTER — Telehealth (HOSPITAL_COMMUNITY): Payer: Self-pay

## 2018-11-04 ENCOUNTER — Telehealth (HOSPITAL_COMMUNITY): Payer: Self-pay

## 2018-11-09 ENCOUNTER — Telehealth (HOSPITAL_COMMUNITY): Payer: Self-pay | Admitting: Internal Medicine

## 2018-11-10 ENCOUNTER — Telehealth (HOSPITAL_COMMUNITY): Payer: Self-pay | Admitting: Internal Medicine

## 2018-11-25 ENCOUNTER — Ambulatory Visit (HOSPITAL_COMMUNITY): Payer: PPO

## 2018-12-12 NOTE — Progress Notes (Signed)
Patient Care Team: Ernestene Kiel, MD as PCP - General (Internal Medicine) Collene Gobble, MD (Pulmonary Disease) Stark Klein, MD as Consulting Physician (Surgical Oncology) Eppie Gibson, MD as Attending Physician (Radiation Oncology) Nicholas Lose, MD as Consulting Physician (Hematology and Oncology) Delice Bison Charlestine Massed, NP as Nurse Practitioner (Hematology and Oncology)  DIAGNOSIS:    ICD-10-CM   1. Carcinoma of upper-outer quadrant of right breast in female, estrogen receptor positive ([place])  C50.411    Z17.0     SUMMARY OF ONCOLOGIC HISTORY: Oncology History  Carcinoma of upper-outer quadrant of right breast in female, estrogen receptor positive ([place])  07/03/2016 Initial Diagnosis   Right breast biopsy 10:30 position: IDC, lymphovascular invasion present, right axillary lymph node positive, grade 3, ER 90%, PR 60%, Ki-67 12%, HER-2 negative ratio 0.41; right breast mass 9 cm from nipple and 30 position: 1.7 x 1 x 1.6 cm, 2 abnormal lymph nodes right axilla largest 1.9 cm smaller 1.2 cm; T1c N1 stage II a   08/14/2016 Surgery   Rt Lumpectomy: IDC grade 2, 1.8 cm, 2/15 LN, ER 90%, PR 60%, Ki-67 12%, HER-2 negative ratio 0.41; T1CN1 (Stage 2A)   08/18/2016 Miscellaneous   Mammaprint high risk luminal B: Patient refused chemotherapy. 5 year risk of recurrence 20%, 10 year risk of recurrence 29%; patient refused chemotherapy and radiation   09/01/2016 -  Anti-estrogen oral therapy   Anastrozole 1 mg daily switched to letrozole 06/16/2017 due to arthralgias and myalgias, switched back to anastrozole half tablet daily     CHIEF COMPLIANT: Follow-up on anastrozole therapy  INTERVAL HISTORY: Linda [name] is a 73 y.o. with above-mentioned history of right breast cancer treated with lumpectomy. She refused chemotherapy or radiation. She is currently on oral antiestrogen therapy with anastrozole, 1/2 tablet daily. Mammogram on 09/09/18 showed no evidence of malignancy  bilaterally. She presents to the clinic today for annual follow-up.  Her biggest complaints are related to her right ankle which needs surgery but surgery cannot be done because of her severe COPD.  She also has chronic breast pain at the site of prior surgery.  REVIEW OF SYSTEMS:   Constitutional: Denies fevers, chills or abnormal weight loss Eyes: Denies blurriness of vision Ears, nose, mouth, throat, and face: Denies mucositis or sore throat Respiratory: Shortness of breath and wheezing from COPD Cardiovascular: Denies palpitation, chest discomfort Gastrointestinal: Denies nausea, heartburn or change in bowel habits Skin: Denies abnormal skin rashes Lymphatics: Denies new lymphadenopathy or easy bruising Neurological: Denies numbness, tingling or new weaknesses Behavioral/Psych: Mood is stable, no new changes  Extremities: Pain in the right ankle Breast: Pain of the right breast and axilla at the site of prior surgery All other systems were reviewed with the patient and are negative.  I have reviewed the past medical history, past surgical history, social history and family history with the patient and they are unchanged from previous note.  ALLERGIES:  is allergic to aspirin.  MEDICATIONS:  Current Outpatient Medications  Medication Sig Dispense Refill  . amLODipine (NORVASC) 5 MG tablet Take 10 mg by mouth daily before breakfast.     . azithromycin (ZITHROMAX) 250 MG tablet Take 1 tablet (250 mg total) by mouth daily. 30 tablet 3  . Cholecalciferol (VITAMIN D3) 2000 units capsule Take 2,000 Units by mouth every 14 (fourteen) days. Once every 2-3 weeks (when she remembers)    . Fluticasone-Umeclidin-Vilant (TRELEGY ELLIPTA) 100-62.5-25 MCG/INH AEPB Take 1 puff by mouth daily. 60 each 5  . ipratropium-albuterol (DUONEB)  0.5-2.5 (3) MG/3ML SOLN Take 3 mLs by nebulization every 4 (four) hours as needed. 360 mL 11  . losartan (COZAAR) 100 MG tablet Take 50 mg by mouth daily before  breakfast.     . meloxicam (MOBIC) 15 MG tablet Take 15 mg by mouth daily before breakfast.     . omeprazole (PRILOSEC) 20 MG capsule Take 20 mg by mouth daily before breakfast.     . Omeprazole-Sodium Bicarbonate (ZEGERID OTC) 20-1100 MG CAPS capsule Take 1 capsule by mouth daily before breakfast.    . pravastatin (PRAVACHOL) 20 MG tablet Take 40 mg by mouth daily before breakfast.     . PROAIR HFA 108 (90 Base) MCG/ACT inhaler INHALE 1 TO 2 PUFFS BY MOUTH EVERY 6 HOURS AS NEEDED FOR WHEEZING OR  SHORTNESS  OF  BREATH 9 each 2  . Probiotic Product ([place]) Take by mouth.     No current facility-administered medications for this visit.     PHYSICAL EXAMINATION: ECOG PERFORMANCE STATUS: 1 - Symptomatic but completely ambulatory  Vitals:   12/13/18 1356  BP: 139/80  Pulse: (!) 57  Resp: 17  Temp: 98.9 F (37.2 C)  SpO2: 100%   Filed Weights   12/13/18 1356  Weight: 129 lb 9.6 oz (58.8 kg)    GENERAL: alert, no distress and comfortable SKIN: skin color, texture, turgor are normal, no rashes or significant lesions EYES: normal, Conjunctiva are pink and non-injected, sclera clear OROPHARYNX: no exudate, no erythema and lips, buccal mucosa, and tongue normal  NECK: supple, thyroid normal size, non-tender, without nodularity LYMPH: no palpable lymphadenopathy in the cervical, axillary or inguinal LUNGS: Decreased breath sounds at the bases HEART: regular rate & rhythm and no murmurs and no lower extremity edema ABDOMEN: abdomen soft, non-tender and normal bowel sounds MUSCULOSKELETAL: no cyanosis of digits and no clubbing  NEURO: alert & oriented x 3 with fluent speech, no focal motor/sensory deficits EXTREMITIES: No lower extremity edema Breast: No palpable lumps or nodules.  Severe tenderness in the right breast and axilla.  LABORATORY DATA:  I have reviewed the data as listed CMP Latest Ref Rng & Units 08/15/2016 08/14/2016 08/07/2016  Glucose 65 - 99 mg/dL  97 - 121(H)  BUN 6 - 20 mg/dL 15 - 17  Creatinine 0.44 - 1.00 mg/dL 1.11(H) 0.76 0.97  Sodium 135 - 145 mmol/L 135 - 138  Potassium 3.5 - 5.1 mmol/L 4.3 - 3.7  Chloride 101 - 111 mmol/L 102 - 103  CO2 22 - 32 mmol/L 27 - 25  Calcium 8.9 - 10.3 mg/dL 8.9 - 9.3  Total Protein 6.5 - 8.1 g/dL - - -  Total Bilirubin 0.3 - 1.2 mg/dL - - -  Alkaline Phos 38 - 126 U/L - - -  AST 15 - 41 U/L - - -  ALT 14 - 54 U/L - - -    Lab Results  Component Value Date   WBC 5.7 10/05/2018   HGB 13.7 10/05/2018   HCT 41.5 10/05/2018   MCV 89.5 10/05/2018   PLT 308.0 10/05/2018   NEUTROABS 3.7 10/05/2018    ASSESSMENT & PLAN:  Carcinoma of upper-outer quadrant of right breast in female, estrogen receptor positive ([place]) Rt Lumpectomy 08/14/16: IDC grade 2, 1.8 cm, 2/15 LN, ER 90%, PR 60%, Ki-67 12%, HER-2 negative ratio 0.41; T1CN1 (Stage 2A)  Mammaprint: High Risk: Risk of recurrence 20% at 5 years and 29% of 10 years. Chemotherapy would most likely decrease the risk of  recurrence at 5 years to 5.4%.(Patient refused chemotherapy and radiation)  Current treatment: Anastrozole 1 mg by mouth dailyrestarted 09/01/2016, reduced dosage to half tablet daily discontinued June 2020 because of severe bone and joint pains day today  Anastrozole toxicities: Denies any hot flashes  Patient has chronic osteoarthritis and appears to need an ankle surgery because of a lipoma growing into the joint.  Breast cancer surveillance:  Annual mammogram  09/09/2018: No evidence of malignancy Breast exam 12/13/2018: Benign Bone density 09/15/2016: T score -2.3: Osteopenia I recommended that the patient take bisphosphonate therapy along with calcium and vitamin D, Patient is not interested in taking any medications.  She will discuss with her PCP.  Breast pain: She will look to see if there are any opportunities to treat the breast pain with acupuncture.  Severe COPD: Limiting her ability to get ankle surgery.   Return to clinic in1 yearfor follow-up    No orders of the defined types were placed in this encounter.  The patient has a good understanding of the overall plan. she agrees with it. she will call with any problems that may develop before the next visit here.  Nicholas Lose, MD 12/13/2018  Julious Oka Dorshimer am acting as scribe for Dr. Nicholas Lose.  I have reviewed the above documentation for accuracy and completeness, and I agree with the above.

## 2018-12-13 ENCOUNTER — Other Ambulatory Visit: Payer: Self-pay

## 2018-12-13 ENCOUNTER — Inpatient Hospital Stay: Payer: PPO | Attending: Hematology and Oncology | Admitting: Hematology and Oncology

## 2018-12-13 DIAGNOSIS — Z79899 Other long term (current) drug therapy: Secondary | ICD-10-CM | POA: Diagnosis not present

## 2018-12-13 DIAGNOSIS — Z9221 Personal history of antineoplastic chemotherapy: Secondary | ICD-10-CM | POA: Insufficient documentation

## 2018-12-13 DIAGNOSIS — M8580 Other specified disorders of bone density and structure, unspecified site: Secondary | ICD-10-CM | POA: Diagnosis not present

## 2018-12-13 DIAGNOSIS — Z791 Long term (current) use of non-steroidal anti-inflammatories (NSAID): Secondary | ICD-10-CM | POA: Diagnosis not present

## 2018-12-13 DIAGNOSIS — M1990 Unspecified osteoarthritis, unspecified site: Secondary | ICD-10-CM | POA: Insufficient documentation

## 2018-12-13 DIAGNOSIS — N644 Mastodynia: Secondary | ICD-10-CM | POA: Diagnosis not present

## 2018-12-13 DIAGNOSIS — J449 Chronic obstructive pulmonary disease, unspecified: Secondary | ICD-10-CM | POA: Diagnosis not present

## 2018-12-13 DIAGNOSIS — Z170 Estrogen receptor positive status [ER+]: Secondary | ICD-10-CM | POA: Insufficient documentation

## 2018-12-13 DIAGNOSIS — Z923 Personal history of irradiation: Secondary | ICD-10-CM | POA: Diagnosis not present

## 2018-12-13 DIAGNOSIS — C50411 Malignant neoplasm of upper-outer quadrant of right female breast: Secondary | ICD-10-CM | POA: Diagnosis not present

## 2018-12-13 DIAGNOSIS — Z79811 Long term (current) use of aromatase inhibitors: Secondary | ICD-10-CM | POA: Insufficient documentation

## 2018-12-13 NOTE — Assessment & Plan Note (Signed)
Rt Lumpectomy 08/14/16: IDC grade 2, 1.8 cm, 2/15 LN, ER 90%, PR 60%, Ki-67 12%, HER-2 negative ratio 0.41; T1CN1 (Stage 2A)  Mammaprint: High Risk: Risk of recurrence 20% at 5 years and 29% of 10 years. Chemotherapy would most likely decrease the risk of recurrence at 5 years to 5.4%.(Patient refused chemotherapy and radiation)  Current treatment: Anastrozole 1 mg by mouth dailyrestarted 09/01/2016, reduced dosage to half tablet daily  Anastrozole toxicities: Denies any hot flashes  Patient has chronic osteoarthritis and appears to need an ankle surgery because of a lipoma growing into the joint.  Breast cancer surveillance:  Annual mammogram  09/09/2018: No evidence of malignancy Breast exam 12/13/2018: Benign Bone density 09/15/2016: T score -2.3: Osteopenia I recommended that the patient take bisphosphonate therapy along with calcium and vitamin D  Nausea issues: Probably related to recent GI illness.  Return to clinic in1 yearfor follow-up

## 2018-12-14 ENCOUNTER — Telehealth: Payer: Self-pay | Admitting: Hematology and Oncology

## 2018-12-14 NOTE — Telephone Encounter (Signed)
I left a message regarding schedule  

## 2018-12-23 DIAGNOSIS — Z6821 Body mass index (BMI) 21.0-21.9, adult: Secondary | ICD-10-CM | POA: Diagnosis not present

## 2018-12-23 DIAGNOSIS — M810 Age-related osteoporosis without current pathological fracture: Secondary | ICD-10-CM | POA: Diagnosis not present

## 2018-12-23 DIAGNOSIS — R7301 Impaired fasting glucose: Secondary | ICD-10-CM | POA: Diagnosis not present

## 2018-12-23 DIAGNOSIS — J449 Chronic obstructive pulmonary disease, unspecified: Secondary | ICD-10-CM | POA: Diagnosis not present

## 2018-12-23 DIAGNOSIS — Z79899 Other long term (current) drug therapy: Secondary | ICD-10-CM | POA: Diagnosis not present

## 2018-12-23 DIAGNOSIS — I10 Essential (primary) hypertension: Secondary | ICD-10-CM | POA: Diagnosis not present

## 2018-12-23 DIAGNOSIS — E890 Postprocedural hypothyroidism: Secondary | ICD-10-CM | POA: Diagnosis not present

## 2018-12-23 DIAGNOSIS — E785 Hyperlipidemia, unspecified: Secondary | ICD-10-CM | POA: Diagnosis not present

## 2019-01-13 ENCOUNTER — Other Ambulatory Visit: Payer: Self-pay | Admitting: Pulmonary Disease

## 2019-01-19 ENCOUNTER — Encounter: Payer: Self-pay | Admitting: Pulmonary Disease

## 2019-01-19 ENCOUNTER — Ambulatory Visit: Payer: PPO | Admitting: Pulmonary Disease

## 2019-01-19 ENCOUNTER — Other Ambulatory Visit: Payer: Self-pay

## 2019-01-19 VITALS — BP 118/70 | HR 83 | Temp 98.7°F | Ht 64.0 in | Wt 130.4 lb

## 2019-01-19 DIAGNOSIS — Z720 Tobacco use: Secondary | ICD-10-CM | POA: Diagnosis not present

## 2019-01-19 DIAGNOSIS — Z87891 Personal history of nicotine dependence: Secondary | ICD-10-CM

## 2019-01-19 DIAGNOSIS — J449 Chronic obstructive pulmonary disease, unspecified: Secondary | ICD-10-CM

## 2019-01-19 NOTE — Progress Notes (Signed)
Linda [name]    [id number]    31-Aug-1945  Primary Care Physician:Prochnau, Chrys Racer, MD  Referring Physician: Ernestene Kiel, MD [address]. [place],  [place] [address]  Chief complaint:   Follow up for  COPD GOLD D  HPI: Linda [name] is a 73 year-old with COPD (GOLD D, CAT score 25, multiple exacerbation over past year, FEV1 41%). Maintained on Spiriva and Symbicort. His symptoms have been stable until this year when she has several exacerbations over the winter requiring courses of antibiotics and steroids. Her primary care physician has referred her back to [place] for further management. Her chief complaint is dyspnea on exertion, loss of energy, fatigue,  chronic cough with sputum production.   She's had a diagnosis of breast cancer in April 2018 and underwent lumpectomy and XRT. She declined chemotherapy. Stopped smoking in January 2020.  She is admitted with recurrent exacerbations in 2020 requiring multiple courses of antibiotics and steroids Started on daily azithromycin in July 2020 and nebulizer therapy  Interim History States that breathing is stable.  She is tolerating the azithromycin well and has not had any more exacerbations..  Outpatient Encounter Medications as of 01/19/2019  Medication Sig  . amLODipine (NORVASC) 5 MG tablet Take 10 mg by mouth daily before breakfast.   . azithromycin (ZITHROMAX) 250 MG tablet Take 1 tablet (250 mg total) by mouth daily.  . Cholecalciferol (VITAMIN D3) 2000 units capsule Take 2,000 Units by mouth every 14 (fourteen) days. Once every 2-3 weeks (when she remembers)  . ipratropium-albuterol (DUONEB) 0.5-2.5 (3) MG/3ML SOLN Take 3 mLs by nebulization every 4 (four) hours as needed.  Marland Kitchen losartan (COZAAR) 100 MG tablet Take 50 mg by mouth daily before breakfast.   . meloxicam (MOBIC) 15 MG tablet Take 15 mg by mouth daily before breakfast.   . omeprazole (PRILOSEC) 20 MG capsule Take 20 mg by mouth daily before breakfast.    . Omeprazole-Sodium Bicarbonate (ZEGERID OTC) 20-1100 MG CAPS capsule Take 1 capsule by mouth daily before breakfast.  . pravastatin (PRAVACHOL) 20 MG tablet Take 40 mg by mouth daily before breakfast.   . PROAIR HFA 108 (90 Base) MCG/ACT inhaler INHALE 1 TO 2 PUFFS BY MOUTH EVERY 6 HOURS AS NEEDED FOR WHEEZING OR  SHORTNESS  OF  BREATH  . Probiotic Product ([place]) Take by mouth.  . TRELEGY ELLIPTA 100-62.5-25 MCG/INH AEPB INHALE 1 PUFF INTO LUNGS ONCE DAILY   No facility-administered encounter medications on file as of 01/19/2019.    Physical Exam: Blood pressure 118/70, pulse 83, temperature 98.7 F (37.1 C), temperature source Temporal, height 5\' 4"  (1.626 m), weight 130 lb 6.4 oz (59.1 kg), SpO2 93 %. Gen:      No acute distress HEENT:  EOMI, sclera anicteric Neck:     No masses; no thyromegaly Lungs:    Clear to auscultation bilaterally; normal respiratory effort CV:         Regular rate and rhythm; no murmurs Abd:      + bowel sounds; soft, non-tender; no palpable masses, no distension Ext:    No edema; adequate peripheral perfusion Skin:      Warm and dry; no rash Neuro: alert and oriented x 3 Psych: normal mood and affect  Data Reviewed: Imaging CT chest 02/26/15- mild left lower lobe atelectasis, moderate emphysematous changes.  CT chest 08/01/16- moderate emphysematous changes.  No suspicious lung nodules or mass.  Chronic scarring in the right  lower lobe. I have reviewed the images personally  PFTs  02/18/11 FVC 2.42 [117%), FEV1 1.46 (69%), F/F 46, TLC 112%, DLCO 69% Moderate obstructive defect, mild reduction in diffusion capacity.  08/23/15 FVC 2.14 [71%], FEV1 0.94 [41%], F/F 44, TLC 114%, DLCO 57% Severe obstructive defect with moderate reduction in diffusion capacity  Labs: Alpha-1 antitrypsin sensor 10/05/18-205, PI MM  Assessment:   Severe COPD. GOLD stage D Continue trelegy inhaler Unable to afford Daliresp Continue azithromycin to  250 mg daily for anti-inflammatory effect Albuterol rescue inhaler, duo nebs 2-4 times daily.  Refer for screening CTs of the chest.  Health maintenance 12/24/2018-influenza 01/12/2014-Prevnar 04/01/2011-Pneumovax  Plan/Recommendations: - Continue Trelegy - Daily azithromycin - Albuterol, duo nebs  Marshell Garfinkel MD [facility] 01/19/2019, 1:47 PM  CC: Ernestene Kiel, MD

## 2019-01-19 NOTE — Patient Instructions (Addendum)
I am glad you are doing well with regard to your breathing Continue the Trelegy inhaler Continue azithromycin Follow-up in 6 months.

## 2019-01-19 NOTE — Addendum Note (Signed)
Addended by: Hildred Alamin I on: 01/19/2019 02:01 PM   Modules accepted: Orders

## 2019-01-24 ENCOUNTER — Other Ambulatory Visit: Payer: Self-pay | Admitting: Pulmonary Disease

## 2019-01-24 ENCOUNTER — Telehealth: Payer: Self-pay | Admitting: Pulmonary Disease

## 2019-01-24 DIAGNOSIS — J449 Chronic obstructive pulmonary disease, unspecified: Secondary | ICD-10-CM

## 2019-01-24 NOTE — Telephone Encounter (Signed)
I spoke with pt regarding lung cancer screening.  Pt was dx with Breast Cancer in 2018.  Had lumpectomy but refused chemo and radiation. Pt is currently on antiestorgen therapy.  Pt will not qualify for lung cancer screening due to cancer dx within the past 5 years.

## 2019-02-09 DIAGNOSIS — M19071 Primary osteoarthritis, right ankle and foot: Secondary | ICD-10-CM | POA: Diagnosis not present

## 2019-02-14 ENCOUNTER — Other Ambulatory Visit: Payer: Self-pay | Admitting: Pulmonary Disease

## 2019-02-14 DIAGNOSIS — J449 Chronic obstructive pulmonary disease, unspecified: Secondary | ICD-10-CM

## 2019-02-14 MED ORDER — AZITHROMYCIN 250 MG PO TABS: 250.0000 mg | ORAL_TABLET | Freq: Every day | ORAL | 0 refills | Status: DC

## 2019-02-21 DIAGNOSIS — E890 Postprocedural hypothyroidism: Secondary | ICD-10-CM | POA: Diagnosis not present

## 2019-02-21 DIAGNOSIS — Z853 Personal history of malignant neoplasm of breast: Secondary | ICD-10-CM | POA: Diagnosis not present

## 2019-02-21 DIAGNOSIS — E785 Hyperlipidemia, unspecified: Secondary | ICD-10-CM | POA: Diagnosis not present

## 2019-02-21 DIAGNOSIS — M79604 Pain in right leg: Secondary | ICD-10-CM | POA: Diagnosis not present

## 2019-02-21 DIAGNOSIS — R209 Unspecified disturbances of skin sensation: Secondary | ICD-10-CM | POA: Diagnosis not present

## 2019-02-21 DIAGNOSIS — J449 Chronic obstructive pulmonary disease, unspecified: Secondary | ICD-10-CM | POA: Diagnosis not present

## 2019-02-21 DIAGNOSIS — I7090 Unspecified atherosclerosis: Secondary | ICD-10-CM | POA: Diagnosis not present

## 2019-02-21 DIAGNOSIS — I10 Essential (primary) hypertension: Secondary | ICD-10-CM | POA: Diagnosis not present

## 2019-02-21 DIAGNOSIS — R748 Abnormal levels of other serum enzymes: Secondary | ICD-10-CM | POA: Diagnosis not present

## 2019-02-21 DIAGNOSIS — Z1331 Encounter for screening for depression: Secondary | ICD-10-CM | POA: Diagnosis not present

## 2019-02-21 DIAGNOSIS — Z1159 Encounter for screening for other viral diseases: Secondary | ICD-10-CM | POA: Diagnosis not present

## 2019-02-21 DIAGNOSIS — M79605 Pain in left leg: Secondary | ICD-10-CM | POA: Diagnosis not present

## 2019-02-22 ENCOUNTER — Other Ambulatory Visit: Payer: Self-pay | Admitting: Internal Medicine

## 2019-02-22 DIAGNOSIS — M79605 Pain in left leg: Secondary | ICD-10-CM

## 2019-02-22 DIAGNOSIS — R209 Unspecified disturbances of skin sensation: Secondary | ICD-10-CM

## 2019-02-22 DIAGNOSIS — M79604 Pain in right leg: Secondary | ICD-10-CM

## 2019-03-03 ENCOUNTER — Ambulatory Visit
Admission: RE | Admit: 2019-03-03 | Discharge: 2019-03-03 | Disposition: A | Discharge status: Home / Self Care | Payer: PPO | Source: Ambulatory Visit | Attending: Internal Medicine | Admitting: Internal Medicine

## 2019-03-03 DIAGNOSIS — R209 Unspecified disturbances of skin sensation: Secondary | ICD-10-CM

## 2019-03-03 DIAGNOSIS — M79604 Pain in right leg: Secondary | ICD-10-CM

## 2019-03-03 DIAGNOSIS — I70211 Atherosclerosis of native arteries of extremities with intermittent claudication, right leg: Secondary | ICD-10-CM | POA: Diagnosis not present

## 2019-03-03 DIAGNOSIS — M79605 Pain in left leg: Secondary | ICD-10-CM

## 2019-03-07 ENCOUNTER — Other Ambulatory Visit: Payer: Self-pay | Admitting: Pulmonary Disease

## 2019-04-07 ENCOUNTER — Telehealth: Payer: Self-pay | Admitting: Pulmonary Disease

## 2019-04-07 NOTE — Telephone Encounter (Signed)
I called and spoke with the patient. Told her to her get the vaccine whenever it is available for her.

## 2019-04-07 NOTE — Telephone Encounter (Signed)
Spoke with Linda [name], she would like to know what Dr. Vaughan Browner thinks about the Covid vaccine. Do you have any recommendations? Please advise.

## 2019-04-11 NOTE — Telephone Encounter (Signed)
Noted Will sign off 

## 2019-04-23 ENCOUNTER — Ambulatory Visit: Payer: PPO | Attending: Internal Medicine

## 2019-04-23 DIAGNOSIS — Z23 Encounter for immunization: Secondary | ICD-10-CM | POA: Insufficient documentation

## 2019-04-23 NOTE — Progress Notes (Signed)
   Covid-19 Vaccination Clinic  Name:  Ayvree Ricker    MRN: [id number] DOB: 04-Sep-1945  04/23/2019  Ms. Selner was observed post Covid-19 immunization for 15 minutes without incidence. She was provided with Vaccine Information Sheet and instruction to access the V-Safe system.   Ms. Mcbryde was instructed to call 911 with any severe reactions post vaccine: Marland Kitchen Difficulty breathing  . Swelling of your face and throat  . A fast heartbeat  . A bad rash all over your body  . Dizziness and weakness    Immunizations Administered    Name Date Dose VIS Date Route   Pfizer COVID-19 Vaccine 04/23/2019  3:16 PM 0.3 mL 03/11/2019 Intramuscular   Manufacturer: [place]   Lot: [id number]   [place]: [id number]

## 2019-04-24 ENCOUNTER — Other Ambulatory Visit: Payer: Self-pay | Admitting: Pulmonary Disease

## 2019-04-24 DIAGNOSIS — J449 Chronic obstructive pulmonary disease, unspecified: Secondary | ICD-10-CM

## 2019-05-15 ENCOUNTER — Ambulatory Visit: Payer: PPO | Attending: Internal Medicine

## 2019-05-15 DIAGNOSIS — Z23 Encounter for immunization: Secondary | ICD-10-CM | POA: Insufficient documentation

## 2019-05-15 NOTE — Progress Notes (Signed)
   Covid-19 Vaccination Clinic  Name:  Linda [name]    MRN: [id number] DOB: Jun 03, 1945  05/15/2019  Linda [name] was observed post Covid-19 immunization for 15 minutes without incidence. She was provided with Vaccine Information Sheet and instruction to access the V-Safe system.   Linda [name] was instructed to call 911 with any severe reactions post vaccine: Marland Kitchen Difficulty breathing  . Swelling of your face and throat  . A fast heartbeat  . A bad rash all over your body  . Dizziness and weakness    Immunizations Administered    Name Date Dose VIS Date Route   Pfizer COVID-19 Vaccine 05/15/2019  1:41 PM 0.3 mL 03/11/2019 Intramuscular   Manufacturer: [place]   Lot: [id number]   [place]: [id number]

## 2019-06-02 ENCOUNTER — Other Ambulatory Visit: Payer: Self-pay | Admitting: Pulmonary Disease

## 2019-06-02 DIAGNOSIS — J449 Chronic obstructive pulmonary disease, unspecified: Secondary | ICD-10-CM

## 2019-07-19 ENCOUNTER — Ambulatory Visit: Payer: PPO | Admitting: Pulmonary Disease

## 2019-07-19 ENCOUNTER — Encounter: Payer: Self-pay | Admitting: Pulmonary Disease

## 2019-07-19 ENCOUNTER — Other Ambulatory Visit: Payer: Self-pay

## 2019-07-19 VITALS — BP 126/70 | HR 77 | Temp 98.4°F | Ht 64.0 in | Wt 130.6 lb

## 2019-07-19 DIAGNOSIS — J449 Chronic obstructive pulmonary disease, unspecified: Secondary | ICD-10-CM | POA: Diagnosis not present

## 2019-07-19 MED ORDER — TRELEGY ELLIPTA 100-62.5-25 MCG/INH IN AEPB: 1.0000 | INHALATION_SPRAY | Freq: Every day | RESPIRATORY_TRACT | 0 refills | Status: DC

## 2019-07-19 NOTE — Addendum Note (Signed)
Addended by: Hildred Alamin I on: 07/19/2019 02:23 PM   Modules accepted: Orders

## 2019-07-19 NOTE — Progress Notes (Signed)
Phara Cantey    [id number]    June 15, 1945  Primary Care Physician:Prochnau, Chrys Racer, MD  Referring Physician: Ernestene Kiel, MD [address] [place],  [place] [address]  Chief complaint:   Follow up for  COPD GOLD D  HPI: Mrs. Knack is a 74 year-old with COPD (GOLD D, CAT score 25, multiple exacerbation over past year, FEV1 41%). Maintained on Spiriva and Symbicort. His symptoms have been stable until this year when she has several exacerbations over the winter requiring courses of antibiotics and steroids. Her primary care physician has referred her back to [place] for further management. Her chief complaint is dyspnea on exertion, loss of energy, fatigue,  chronic cough with sputum production.   She's had a diagnosis of breast cancer in April 2018 and underwent lumpectomy and XRT. She declined chemotherapy. Stopped smoking in January 2020.  She is admitted with recurrent exacerbations in 2020 requiring multiple courses of antibiotics and steroids Started on daily azithromycin in July 2020 and nebulizer therapy  Interim History Stable on chronic azithromycin.  She feels that this is helping She has not had any more exacerbations for the past year Has chronic dyspnea on exertion.  Outpatient Encounter Medications as of 07/19/2019  Medication Sig  . amLODipine (NORVASC) 5 MG tablet Take 10 mg by mouth daily before breakfast.   . azithromycin (ZITHROMAX) 250 MG tablet Take 1 tablet by mouth once daily  . Cholecalciferol (VITAMIN D3) 2000 units capsule Take 2,000 Units by mouth every 14 (fourteen) days. Once every 2-3 weeks (when she remembers)  . ipratropium-albuterol (DUONEB) 0.5-2.5 (3) MG/3ML SOLN Take 3 mLs by nebulization every 4 (four) hours as needed.  Marland Kitchen losartan (COZAAR) 100 MG tablet Take 50 mg by mouth daily before breakfast.   . meloxicam (MOBIC) 15 MG tablet Take 15 mg by mouth daily before breakfast.   . omeprazole (PRILOSEC) 20 MG capsule Take 20 mg by  mouth daily before breakfast.   . Omeprazole-Sodium Bicarbonate (ZEGERID OTC) 20-1100 MG CAPS capsule Take 1 capsule by mouth daily before breakfast.  . pravastatin (PRAVACHOL) 20 MG tablet Take 40 mg by mouth daily before breakfast.   . PROAIR HFA 108 (90 Base) MCG/ACT inhaler INHALE 1 TO 2 PUFFS BY MOUTH EVERY 6 HOURS AS NEEDED FOR WHEEZING FOR SHORTNESS OF BREATH  . Probiotic Product ([place]) Take by mouth.  . TRELEGY ELLIPTA 100-62.5-25 MCG/INH AEPB INHALE 1 PUFF ONCE DAILY   No facility-administered encounter medications on file as of 07/19/2019.   Physical Exam: Blood pressure 126/70, pulse 77, temperature 98.4 F (36.9 C), temperature source Temporal, height 5\' 4"  (1.626 m), weight 130 lb 9.6 oz (59.2 kg), SpO2 97 %. Gen:      No acute distress HEENT:  EOMI, sclera anicteric Neck:     No masses; no thyromegaly Lungs:    Clear to auscultation bilaterally; normal respiratory effort CV:         Regular rate and rhythm; no murmurs Abd:      + bowel sounds; soft, non-tender; no palpable masses, no distension Ext:    No edema; adequate peripheral perfusion Skin:      Warm and dry; no rash Neuro: alert and oriented x 3 Psych: normal mood and affect  Data Reviewed: Imaging CT chest 02/26/15- mild left lower lobe atelectasis, moderate emphysematous changes.  CT chest 08/01/16- moderate emphysematous changes.  No suspicious lung nodules or mass.  Chronic scarring in the right  lower lobe. I have reviewed the images personally  PFTs  02/18/11 FVC 2.42 [117%), FEV1 1.46 (69%), F/F 46, TLC 112%, DLCO 69% Moderate obstructive defect, mild reduction in diffusion capacity.  08/23/15 FVC 2.14 [71%], FEV1 0.94 [41%], F/F 44, TLC 114%, DLCO 57% Severe obstructive defect with moderate reduction in diffusion capacity  Labs: Alpha-1 antitrypsin sensor 10/05/18-205, PI MM  Assessment:   Severe COPD. GOLD stage D Continue trelegy inhaler Unable to afford Daliresp Continue  azithromycin to 250 mg daily for anti-inflammatory effect Albuterol rescue inhaler, duo nebs 2-4 times daily.  Refer for screening CTs of the chest.  Health maintenance 12/24/2018-influenza 01/12/2014-Prevnar 04/01/2011-Pneumovax  Plan/Recommendations: - Continue Trelegy - Daily azithromycin - Albuterol, duo nebs  Marshell Garfinkel MD [facility] 07/19/2019, 2:12 PM  CC: Ernestene Kiel, MD

## 2019-07-19 NOTE — Progress Notes (Deleted)
Linda [name]    [id number]    [birth year]  Primary Care Physician:Prochnau, Chrys Racer, MD  Referring Physician: Ernestene Kiel, MD [address]. [place],  [place] [address]  Chief complaint:   Follow up for  COPD GOLD D  HPI: Mrs. Krengel is a 74 year-old with COPD (GOLD D, CAT score 25, multiple exacerbation over past year, FEV1 41%). Maintained on Spiriva and Symbicort. His symptoms have been stable until this year when she has several exacerbations over the winter requiring courses of antibiotics and steroids. Her primary care physician has referred her back to [place] for further management. Her chief complaint is dyspnea on exertion, loss of energy, fatigue,  chronic cough with sputum production.   She's had a diagnosis of breast cancer in April 2018 and underwent lumpectomy and XRT. She declined chemotherapy. Stopped smoking in January 2020.  She is admitted with recurrent exacerbations in 2020 requiring multiple courses of antibiotics and steroids Started on daily azithromycin in July 2020 and nebulizer therapy  Interim History States that breathing is stable.  She is tolerating the azithromycin well and has not had any more exacerbations..  Outpatient Encounter Medications as of 07/19/2019  Medication Sig  . amLODipine (NORVASC) 5 MG tablet Take 10 mg by mouth daily before breakfast.   . azithromycin (ZITHROMAX) 250 MG tablet Take 1 tablet by mouth once daily  . Cholecalciferol (VITAMIN D3) 2000 units capsule Take 2,000 Units by mouth every 14 (fourteen) days. Once every 2-3 weeks (when she remembers)  . ipratropium-albuterol (DUONEB) 0.5-2.5 (3) MG/3ML SOLN Take 3 mLs by nebulization every 4 (four) hours as needed.  Marland Kitchen losartan (COZAAR) 100 MG tablet Take 50 mg by mouth daily before breakfast.   . meloxicam (MOBIC) 15 MG tablet Take 15 mg by mouth daily before breakfast.   . omeprazole (PRILOSEC) 20 MG capsule Take 20 mg by mouth daily before breakfast.   .  Omeprazole-Sodium Bicarbonate (ZEGERID OTC) 20-1100 MG CAPS capsule Take 1 capsule by mouth daily before breakfast.  . pravastatin (PRAVACHOL) 20 MG tablet Take 40 mg by mouth daily before breakfast.   . PROAIR HFA 108 (90 Base) MCG/ACT inhaler INHALE 1 TO 2 PUFFS BY MOUTH EVERY 6 HOURS AS NEEDED FOR WHEEZING FOR SHORTNESS OF BREATH  . Probiotic Product ([place]) Take by mouth.  . TRELEGY ELLIPTA 100-62.5-25 MCG/INH AEPB INHALE 1 PUFF ONCE DAILY   No facility-administered encounter medications on file as of 07/19/2019.   Physical Exam: Blood pressure 118/70, pulse 83, temperature 98.7 F (37.1 C), temperature source Temporal, height 5\' 4"  (1.626 m), weight 130 lb 6.4 oz (59.1 kg), SpO2 93 %. Gen:      No acute distress HEENT:  EOMI, sclera anicteric Neck:     No masses; no thyromegaly Lungs:    Clear to auscultation bilaterally; normal respiratory effort CV:         Regular rate and rhythm; no murmurs Abd:      + bowel sounds; soft, non-tender; no palpable masses, no distension Ext:    No edema; adequate peripheral perfusion Skin:      Warm and dry; no rash Neuro: alert and oriented x 3 Psych: normal mood and affect  Data Reviewed: Imaging CT chest 02/26/15- mild left lower lobe atelectasis, moderate emphysematous changes.  CT chest 08/01/16- moderate emphysematous changes.  No suspicious lung nodules or mass.  Chronic scarring in the right lower lobe. I have reviewed the images personally  PFTs  02/18/11 FVC 2.42 [117%), FEV1 1.46 (69%), F/F 46, TLC 112%, DLCO 69% Moderate obstructive defect, mild reduction in diffusion capacity.  08/23/15 FVC 2.14 [71%], FEV1 0.94 [41%], F/F 44, TLC 114%, DLCO 57% Severe obstructive defect with moderate reduction in diffusion capacity  Labs: Alpha-1 antitrypsin sensor 10/05/18-205, PI MM  Assessment:   Severe COPD. GOLD stage D Continue trelegy inhaler Unable to afford Daliresp Continue azithromycin to 250 mg daily for  anti-inflammatory effect Albuterol rescue inhaler, duo nebs 2-4 times daily.  Refer for screening CTs of the chest.  Health maintenance 12/24/2018-influenza 01/12/2014-Prevnar 04/01/2011-Pneumovax  Plan/Recommendations: - Continue Trelegy - Daily azithromycin - Albuterol, duo nebs   Marshell Garfinkel MD [facility] 07/19/2019, 2:09 PM  CC: Ernestene Kiel, MD

## 2019-07-20 ENCOUNTER — Telehealth: Payer: Self-pay | Admitting: Pulmonary Disease

## 2019-07-20 DIAGNOSIS — J449 Chronic obstructive pulmonary disease, unspecified: Secondary | ICD-10-CM

## 2019-07-20 DIAGNOSIS — Z720 Tobacco use: Secondary | ICD-10-CM

## 2019-07-20 NOTE — Telephone Encounter (Signed)
Dr, Vaughan Browner did you want pt to have CT chest?

## 2019-07-22 NOTE — Telephone Encounter (Signed)
Dr Vaughan Browner, the following note is what I documented from a referral from 12/2018:  General 01/24/2019 1:56 PM Christie Beckers, RN - -  Note   I spoke with pt regarding lung cancer screening. Pt was dx with Breast Cancer in 2018. Had lumpectomy but refused chemo and radiation. Pt is currently on antiestorgen therapy. Pt will not qualify for lung cancer screening due to cancer dx within the past 5 years.  Referral has been cancelled.          Let me know if there is anything else I can help with.

## 2019-07-22 NOTE — Telephone Encounter (Signed)
Yes.  Refer to screening CTs of the chest.  Thanks

## 2019-07-22 NOTE — Telephone Encounter (Signed)
Ok. Thanks for the reminder. Please cancel order for CT and get chest x ray. Inform the patient

## 2019-07-22 NOTE — Telephone Encounter (Signed)
Attempted to call pt but unable to reach. Left message for pt to return call. 

## 2019-07-22 NOTE — Telephone Encounter (Signed)
Referral placed. Sending message to [facility] for follow-up.

## 2019-07-25 NOTE — Telephone Encounter (Signed)
LMTCB x2 for pt 

## 2019-07-26 ENCOUNTER — Telehealth: Payer: Self-pay | Admitting: Pulmonary Disease

## 2019-07-26 NOTE — Telephone Encounter (Signed)
LMTCB X3 per protocol will close out this message.  Looks like CT order has been canceled and CXR needs to be ordered once patient calls back.

## 2019-07-26 NOTE — Telephone Encounter (Signed)
Patient called back and I told her that since she is on the antiestorgen she didn't qualify for the program. Patient states that she has not been on that for over a year and her Oncologist is aware. She wont qualify until after the 5 year mark is that right?

## 2019-07-26 NOTE — Telephone Encounter (Signed)
Spoke with patient sent message to [facility] for clarification. Will close this message since the other one is open.

## 2019-07-28 ENCOUNTER — Other Ambulatory Visit: Payer: Self-pay | Admitting: Hematology and Oncology

## 2019-07-28 DIAGNOSIS — Z853 Personal history of malignant neoplasm of breast: Secondary | ICD-10-CM

## 2019-07-28 DIAGNOSIS — F5101 Primary insomnia: Secondary | ICD-10-CM | POA: Diagnosis not present

## 2019-07-28 DIAGNOSIS — I10 Essential (primary) hypertension: Secondary | ICD-10-CM | POA: Diagnosis not present

## 2019-07-28 DIAGNOSIS — Z6822 Body mass index (BMI) 22.0-22.9, adult: Secondary | ICD-10-CM | POA: Diagnosis not present

## 2019-07-28 DIAGNOSIS — E785 Hyperlipidemia, unspecified: Secondary | ICD-10-CM | POA: Diagnosis not present

## 2019-07-28 DIAGNOSIS — Z7189 Other specified counseling: Secondary | ICD-10-CM | POA: Diagnosis not present

## 2019-07-28 DIAGNOSIS — E890 Postprocedural hypothyroidism: Secondary | ICD-10-CM | POA: Diagnosis not present

## 2019-07-28 DIAGNOSIS — Z1339 Encounter for screening examination for other mental health and behavioral disorders: Secondary | ICD-10-CM | POA: Diagnosis not present

## 2019-07-28 DIAGNOSIS — R7301 Impaired fasting glucose: Secondary | ICD-10-CM | POA: Diagnosis not present

## 2019-07-28 DIAGNOSIS — Z1331 Encounter for screening for depression: Secondary | ICD-10-CM | POA: Diagnosis not present

## 2019-07-28 DIAGNOSIS — Z0000 Encounter for general adult medical examination without abnormal findings: Secondary | ICD-10-CM | POA: Diagnosis not present

## 2019-07-28 DIAGNOSIS — J449 Chronic obstructive pulmonary disease, unspecified: Secondary | ICD-10-CM | POA: Diagnosis not present

## 2019-07-28 DIAGNOSIS — Z79899 Other long term (current) drug therapy: Secondary | ICD-10-CM | POA: Diagnosis not present

## 2019-07-29 NOTE — Telephone Encounter (Signed)
ATC - Phone rang but no voicemail - Will call back - Pt will need to come for cxr since she does not qualify for lung screening at this time

## 2019-08-02 NOTE — Telephone Encounter (Signed)
Spoke with pt and explained lung cancer screening guidelines for pt's who have had a h/o cancer. Pt verbalized understanding that she can be screened once she has been cancer free for 5 years. Pt aware that she needs to come to the office for a CXR. Order has been placed. Nothing further needed at this time.

## 2019-08-02 NOTE — Telephone Encounter (Signed)
LMTC x 1  

## 2019-08-02 NOTE — Addendum Note (Signed)
Addended by: Doroteo Glassman D on: 08/02/2019 10:40 AM   Modules accepted: Orders

## 2019-08-10 ENCOUNTER — Ambulatory Visit (INDEPENDENT_AMBULATORY_CARE_PROVIDER_SITE_OTHER): Payer: PPO

## 2019-08-10 DIAGNOSIS — Z720 Tobacco use: Secondary | ICD-10-CM | POA: Diagnosis not present

## 2019-08-10 DIAGNOSIS — J449 Chronic obstructive pulmonary disease, unspecified: Secondary | ICD-10-CM

## 2019-08-16 ENCOUNTER — Telehealth: Payer: Self-pay | Admitting: Pulmonary Disease

## 2019-08-16 NOTE — Telephone Encounter (Signed)
Pt's xray results have been faxed to PCP per request. Called and spoke with pt letting her know this had been done and she verbalized understanding. Nothing further needed.

## 2019-08-16 NOTE — Telephone Encounter (Signed)
Results need to be faxed to [phone]

## 2019-08-28 ENCOUNTER — Other Ambulatory Visit: Payer: Self-pay | Admitting: Pulmonary Disease

## 2019-08-28 DIAGNOSIS — J449 Chronic obstructive pulmonary disease, unspecified: Secondary | ICD-10-CM

## 2019-09-12 ENCOUNTER — Other Ambulatory Visit: Payer: Self-pay

## 2019-09-12 ENCOUNTER — Ambulatory Visit
Admission: RE | Admit: 2019-09-12 | Discharge: 2019-09-12 | Disposition: A | Discharge status: Home / Self Care | Payer: PPO | Source: Ambulatory Visit | Attending: Hematology and Oncology | Admitting: Hematology and Oncology

## 2019-09-12 DIAGNOSIS — R928 Other abnormal and inconclusive findings on diagnostic imaging of breast: Secondary | ICD-10-CM | POA: Diagnosis not present

## 2019-09-12 DIAGNOSIS — Z853 Personal history of malignant neoplasm of breast: Secondary | ICD-10-CM

## 2019-09-14 DIAGNOSIS — M79671 Pain in right foot: Secondary | ICD-10-CM | POA: Diagnosis not present

## 2019-10-03 ENCOUNTER — Other Ambulatory Visit: Payer: Self-pay | Admitting: Pulmonary Disease

## 2019-12-15 ENCOUNTER — Other Ambulatory Visit: Payer: Self-pay

## 2019-12-15 ENCOUNTER — Inpatient Hospital Stay: Payer: PPO | Attending: Hematology and Oncology | Admitting: Hematology and Oncology

## 2019-12-15 DIAGNOSIS — Z79899 Other long term (current) drug therapy: Secondary | ICD-10-CM | POA: Diagnosis not present

## 2019-12-15 DIAGNOSIS — J449 Chronic obstructive pulmonary disease, unspecified: Secondary | ICD-10-CM | POA: Insufficient documentation

## 2019-12-15 DIAGNOSIS — Z79811 Long term (current) use of aromatase inhibitors: Secondary | ICD-10-CM | POA: Insufficient documentation

## 2019-12-15 DIAGNOSIS — Z791 Long term (current) use of non-steroidal anti-inflammatories (NSAID): Secondary | ICD-10-CM | POA: Insufficient documentation

## 2019-12-15 DIAGNOSIS — M8580 Other specified disorders of bone density and structure, unspecified site: Secondary | ICD-10-CM | POA: Diagnosis not present

## 2019-12-15 DIAGNOSIS — C50411 Malignant neoplasm of upper-outer quadrant of right female breast: Secondary | ICD-10-CM | POA: Diagnosis not present

## 2019-12-15 DIAGNOSIS — Z170 Estrogen receptor positive status [ER+]: Secondary | ICD-10-CM | POA: Diagnosis not present

## 2019-12-15 DIAGNOSIS — M1990 Unspecified osteoarthritis, unspecified site: Secondary | ICD-10-CM | POA: Diagnosis not present

## 2019-12-15 NOTE — Assessment & Plan Note (Signed)
Rt Lumpectomy 08/14/16: IDC grade 2, 1.8 cm, 2/15 LN, ER 90%, PR 60%, Ki-67 12%, HER-2 negative ratio 0.41; T1CN1 (Stage 2A)  Mammaprint: High Risk: Risk of recurrence 20% at 5 years and 29% of 10 years. Chemotherapy would most likely decrease the risk of recurrence at 5 years to 5.4%.(Patient refused chemotherapy and radiation)  Current treatment: Anastrozole 1 mg by mouth dailyrestarted 09/01/2016,reduced dosage to half tablet daily discontinued June 2020 because of severe bone and joint pains day today  Anastrozole toxicities: Denies any hot flashes Patient has chronic osteoarthritis and appears to need an ankle surgery because of a lipoma growing into the joint.  Breast cancer surveillance:  Annual mammogram6/14/2021 : No evidence of malignancy Breast exam 12/15/2019: Benign Bone density 09/15/2016: T score -2.3: Osteopenia I recommended that the patient take bisphosphonate therapy along with calcium and vitamin D, Patient is not interested in taking any medications.  She will discuss with her PCP.  Breast pain: She will look to see if there are any opportunities to treat the breast pain with acupuncture.  Severe COPD: Limiting her ability to get ankle surgery.  Return to clinic in1 yearfor follow-up

## 2019-12-15 NOTE — Progress Notes (Signed)
Patient Care Team: Ernestene Kiel, MD as PCP - General (Internal Medicine) Collene Gobble, MD (Pulmonary Disease) Stark Klein, MD as Consulting Physician (Surgical Oncology) Eppie Gibson, MD as Attending Physician (Radiation Oncology) Nicholas Lose, MD as Consulting Physician (Hematology and Oncology) Delice Bison Charlestine Massed, NP as Nurse Practitioner (Hematology and Oncology)  DIAGNOSIS:  Encounter Diagnosis  Name Primary?  . Carcinoma of upper-outer quadrant of right breast in female, estrogen receptor positive ([place])     SUMMARY OF ONCOLOGIC HISTORY: Oncology History  Carcinoma of upper-outer quadrant of right breast in female, estrogen receptor positive ([place])  07/03/2016 Initial Diagnosis   Right breast biopsy 10:30 position: IDC, lymphovascular invasion present, right axillary lymph node positive, grade 3, ER 90%, PR 60%, Ki-67 12%, HER-2 negative ratio 0.41; right breast mass 9 cm from nipple and 30 position: 1.7 x 1 x 1.6 cm, 2 abnormal lymph nodes right axilla largest 1.9 cm smaller 1.2 cm; T1c N1 stage II a   08/14/2016 Surgery   Rt Lumpectomy: IDC grade 2, 1.8 cm, 2/15 LN, ER 90%, PR 60%, Ki-67 12%, HER-2 negative ratio 0.41; T1CN1 (Stage 2A)   08/18/2016 Miscellaneous   Mammaprint high risk luminal B: Patient refused chemotherapy. 5 year risk of recurrence 20%, 10 year risk of recurrence 29%; patient refused chemotherapy and radiation   09/01/2016 -  Anti-estrogen oral therapy   Anastrozole 1 mg daily switched to letrozole 06/16/2017 due to arthralgias and myalgias, switched back to anastrozole half tablet daily     CHIEF COMPLIANT: Annual follow-up of history of breast cancer  INTERVAL HISTORY: Linda [name] is a 74 year old with above-mentioned history of right breast cancer treated with lumpectomy and even though she was high risk based on MammaPrint, she did not receive chemotherapy or radiation. She tried anastrozole for couple of years and switched to  letrozole but finally discontinued antiestrogen therapy as well because of muscle aches and pains. She is here for annual follow-up and reports that overall her health has been fairly stable. Denies any lumps or nodules in the breast. Still continues to have pain and discomfort at the surgical scars.   ALLERGIES:  is allergic to aspirin.  MEDICATIONS:  Current Outpatient Medications  Medication Sig Dispense Refill  . amLODipine (NORVASC) 5 MG tablet Take 10 mg by mouth daily before breakfast.     . azithromycin (ZITHROMAX) 250 MG tablet Take 1 tablet by mouth once daily 30 tablet 4  . Cholecalciferol (VITAMIN D3) 2000 units capsule Take 2,000 Units by mouth every 14 (fourteen) days. Once every 2-3 weeks (when she remembers)    . Fluticasone-Umeclidin-Vilant (TRELEGY ELLIPTA) 100-62.5-25 MCG/INH AEPB Inhale 1 puff into the lungs daily. 28 each 0  . ipratropium-albuterol (DUONEB) 0.5-2.5 (3) MG/3ML SOLN Take 3 mLs by nebulization every 4 (four) hours as needed. 360 mL 11  . losartan (COZAAR) 100 MG tablet Take 50 mg by mouth daily before breakfast.     . meloxicam (MOBIC) 15 MG tablet Take 15 mg by mouth daily before breakfast.     . omeprazole (PRILOSEC) 20 MG capsule Take 20 mg by mouth daily before breakfast.     . Omeprazole-Sodium Bicarbonate (ZEGERID OTC) 20-1100 MG CAPS capsule Take 1 capsule by mouth daily before breakfast.    . pravastatin (PRAVACHOL) 20 MG tablet Take 40 mg by mouth daily before breakfast.     . PROAIR HFA 108 (90 Base) MCG/ACT inhaler INHALE 1 TO 2 PUFFS BY MOUTH EVERY 6 HOURS AS NEEDED FOR WHEEZING FOR  SHORTNESS OF BREATH 18 g 11  . Probiotic Product ([place]) Take by mouth.    . TRELEGY ELLIPTA 100-62.5-25 MCG/INH AEPB INHALE 1 PUFF ONCE DAILY 60 each 3   No current facility-administered medications for this visit.    PHYSICAL EXAMINATION: ECOG PERFORMANCE STATUS: 1 - Symptomatic but completely ambulatory  Vitals:   12/15/19 1317  BP:  140/74  Pulse: 76  Resp: 18  Temp: (!) 97.2 F (36.2 C)  SpO2: 98%   Filed Weights   12/15/19 1317  Weight: 132 lb (59.9 kg)    BREAST: No palpable masses or nodules in either right or left breasts. No palpable axillary supraclavicular or infraclavicular adenopathy no breast tenderness or nipple discharge. (exam performed in the presence of a chaperone)  LABORATORY DATA:  I have reviewed the data as listed CMP Latest Ref Rng & Units 08/15/2016 08/14/2016 08/07/2016  Glucose 65 - 99 mg/dL 97 - 121(H)  BUN 6 - 20 mg/dL 15 - 17  Creatinine 0.44 - 1.00 mg/dL 1.11(H) 0.76 0.97  Sodium 135 - 145 mmol/L 135 - 138  Potassium 3.5 - 5.1 mmol/L 4.3 - 3.7  Chloride 101 - 111 mmol/L 102 - 103  CO2 22 - 32 mmol/L 27 - 25  Calcium 8.9 - 10.3 mg/dL 8.9 - 9.3  Total Protein 6.5 - 8.1 g/dL - - -  Total Bilirubin 0.3 - 1.2 mg/dL - - -  Alkaline Phos 38 - 126 U/L - - -  AST 15 - 41 U/L - - -  ALT 14 - 54 U/L - - -    Lab Results  Component Value Date   WBC 5.7 10/05/2018   HGB 13.7 10/05/2018   HCT 41.5 10/05/2018   MCV 89.5 10/05/2018   PLT 308.0 10/05/2018   NEUTROABS 3.7 10/05/2018    ASSESSMENT & PLAN:  Carcinoma of upper-outer quadrant of right breast in female, estrogen receptor positive ([place]) Rt Lumpectomy 08/14/16: IDC grade 2, 1.8 cm, 2/15 LN, ER 90%, PR 60%, Ki-67 12%, HER-2 negative ratio 0.41; T1CN1 (Stage 2A)  Mammaprint: High Risk: Risk of recurrence 20% at 5 years and 29% of 10 years. Chemotherapy would most likely decrease the risk of recurrence at 5 years to 5.4%.(Patient refused chemotherapy and radiation)  Current treatment: Anastrozole 1 mg by mouth dailyrestarted 09/01/2016,reduced dosage to half tablet daily discontinued June 2020 because of severe bone and joint pains day today  Anastrozole toxicities: Denies any hot flashes Patient has chronic osteoarthritis and appears to need an ankle surgery because of a lipoma growing into the joint.  Breast cancer  surveillance:  Annual mammogram6/14/2021 : No evidence of malignancy Breast exam 12/15/2019: Benign Bone density 09/15/2016: T score -2.3: Osteopenia I recommended that the patient take bisphosphonate therapy along with calcium and vitamin D, Patient is not interested in taking any medications.  She will discuss with her PCP.  Breast pain: Chronic in nature.  Severe COPD: Stable  Return to clinic in1 yearfor follow-up until 5 years.     No orders of the defined types were placed in this encounter.  The patient has a good understanding of the overall plan. she agrees with it. she will call with any problems that may develop before the next visit here. Total time spent: 30 mins including face to face time and time spent for planning, charting and co-ordination of care   Harriette Ohara, MD 12/15/19

## 2019-12-16 ENCOUNTER — Telehealth: Payer: Self-pay | Admitting: Hematology and Oncology

## 2019-12-16 NOTE — Telephone Encounter (Signed)
No 9/16 los. No changes made to pt's schedule.

## 2020-01-19 ENCOUNTER — Ambulatory Visit: Payer: PPO | Admitting: Pulmonary Disease

## 2020-01-19 ENCOUNTER — Other Ambulatory Visit: Payer: Self-pay

## 2020-01-19 ENCOUNTER — Encounter: Payer: Self-pay | Admitting: Pulmonary Disease

## 2020-01-19 VITALS — BP 124/68 | HR 78 | Temp 97.8°F | Ht 64.5 in | Wt 132.4 lb

## 2020-01-19 DIAGNOSIS — J449 Chronic obstructive pulmonary disease, unspecified: Secondary | ICD-10-CM

## 2020-01-19 NOTE — Patient Instructions (Signed)
I am glad you are doing well with regard to your breathing Continue the inhaler and azithromycin  Follow-up in 1 year.

## 2020-01-19 NOTE — Progress Notes (Signed)
Linda [name]    [id number]    1945/10/11  Primary Care Physician:Prochnau, Chrys Racer, MD  Referring Physician: Ernestene Kiel, MD [address]. [place],  [place] [address]  Chief complaint:   Follow up for  COPD GOLD D  HPI: Linda [name] is a 74 year-old with COPD (GOLD D, CAT score 25, multiple exacerbation over past year, FEV1 41%). Maintained on Spiriva and Symbicort. His symptoms have been stable until this year when she has several exacerbations over the winter requiring courses of antibiotics and steroids. Her primary care physician has referred her back to [place] for further management. Her chief complaint is dyspnea on exertion, loss of energy, fatigue,  chronic cough with sputum production.   She's had a diagnosis of breast cancer in April 2018 and underwent lumpectomy and XRT. She declined chemotherapy. Stopped smoking in January 2020.  She is admitted with recurrent exacerbations in 2020 requiring multiple courses of antibiotics and steroids Started on daily azithromycin in July 2020 and nebulizer therapy  Interim History Continues on chronic azithromycin and Trelegy States that breathing is doing well with chronic dyspnea on exertion No new issues today.  Outpatient Encounter Medications as of 01/19/2020  Medication Sig  . amLODipine (NORVASC) 5 MG tablet Take 10 mg by mouth daily before breakfast.   . azithromycin (ZITHROMAX) 250 MG tablet Take 1 tablet by mouth once daily  . Cholecalciferol (VITAMIN D3) 2000 units capsule Take 2,000 Units by mouth every 14 (fourteen) days. Once every 2-3 weeks (when she remembers)  . ipratropium-albuterol (DUONEB) 0.5-2.5 (3) MG/3ML SOLN Take 3 mLs by nebulization every 4 (four) hours as needed.  Marland Kitchen levothyroxine (SYNTHROID) 88 MCG tablet Take 88 mcg by mouth daily.  Marland Kitchen losartan (COZAAR) 100 MG tablet Take 50 mg by mouth daily before breakfast.   . meloxicam (MOBIC) 15 MG tablet Take 15 mg by mouth daily before breakfast.     . nicotine (NICOTROL) 10 MG inhaler Nicotrol 10 mg inhalation cartridge  USE 1 CARTRIDGE 16 TIMES A DAY AS NEEDED  . omeprazole (PRILOSEC) 20 MG capsule Take 20 mg by mouth daily before breakfast.   . Omeprazole-Sodium Bicarbonate (ZEGERID OTC) 20-1100 MG CAPS capsule Take 1 capsule by mouth daily before breakfast.  . pravastatin (PRAVACHOL) 20 MG tablet Take 40 mg by mouth daily before breakfast.   . PROAIR HFA 108 (90 Base) MCG/ACT inhaler INHALE 1 TO 2 PUFFS BY MOUTH EVERY 6 HOURS AS NEEDED FOR WHEEZING FOR SHORTNESS OF BREATH  . Probiotic Product ([place]) Take by mouth.  . TRELEGY ELLIPTA 100-62.5-25 MCG/INH AEPB INHALE 1 PUFF ONCE DAILY  . [DISCONTINUED] Fluticasone-Umeclidin-Vilant (TRELEGY ELLIPTA) 100-62.5-25 MCG/INH AEPB Inhale 1 puff into the lungs daily. (Patient not taking: Reported on 01/19/2020)   No facility-administered encounter medications on file as of 01/19/2020.   Physical Exam: Blood pressure 124/68, pulse 78, temperature 97.8 F (36.6 C), temperature source Other (Comment), height 5' 4.5" (1.638 m), weight 132 lb 6.4 oz (60.1 kg), SpO2 97 %. Gen:      No acute distress HEENT:  EOMI, sclera anicteric Neck:     No masses; no thyromegaly Lungs:    Clear to auscultation bilaterally; normal respiratory effort CV:         Regular rate and rhythm; no murmurs Abd:      + bowel sounds; soft, non-tender; no palpable masses, no distension Ext:    No edema; adequate peripheral perfusion Skin:  Warm and dry; no rash Neuro: alert and oriented x 3 Psych: normal mood and affect  Data Reviewed: Imaging CT chest 02/26/15- mild left lower lobe atelectasis, moderate emphysematous changes.  CT chest 08/01/16- moderate emphysematous changes.  No suspicious lung nodules or mass.  Chronic scarring in the right lower lobe. Chest x-ray 08/10/2019-emphysema, no acute cardiopulmonary abnormality. I have reviewed the images personally  PFTs  02/18/11 FVC 2.42  [117%), FEV1 1.46 (69%), F/F 46, TLC 112%, DLCO 69% Moderate obstructive defect, mild reduction in diffusion capacity.  08/23/15 FVC 2.14 [71%], FEV1 0.94 [41%], F/F 44, TLC 114%, DLCO 57% Severe obstructive defect with moderate reduction in diffusion capacity  Labs: Alpha-1 antitrypsin 10/05/18-205, PI MM  Assessment:   Severe COPD. GOLD stage D Continue trelegy inhaler Unable to afford Daliresp Continue azithromycin to 250 mg daily for anti-inflammatory effect Albuterol rescue inhaler, duo nebs 2-4 times daily.  She is not a candidate for screening CTs of the chest due to history of breast cancer  Health maintenance 01/12/2014-Prevnar 04/01/2011-Pneumovax  Up-to-date with COVID-19 booster and flu vaccine  Plan/Recommendations: - Continue Trelegy - Daily azithromycin - Albuterol, duo nebs  Marshell Garfinkel MD [facility] 01/19/2020, 11:55 AM  CC: Ernestene Kiel, MD

## 2020-01-19 NOTE — Progress Notes (Addendum)
Linda [name]    [id number]    September 13, 1945  Primary Care Physician:Prochnau, Chrys Racer, MD  Referring Physician: Ernestene Kiel, MD [place] [place]. [place],  [place] [address]  Chief complaint:   Follow up for  COPD GOLD D  HPI: Mrs. Batty is a 74 year-old with COPD (GOLD D, CAT score 25, multiple exacerbation over past year, FEV1 41%). Maintained on Spiriva and Symbicort. His symptoms have been stable until this year when she has several exacerbations over the winter requiring courses of antibiotics and steroids. Her primary care physician has referred her back to [place] for further management. Her chief complaint is dyspnea on exertion, loss of energy, fatigue,  chronic cough with sputum production.   She's had a diagnosis of breast cancer in April 2018 and underwent lumpectomy and XRT. She declined chemotherapy. Stopped smoking in January 2020.  She is admitted with recurrent exacerbations in 2020 requiring multiple courses of antibiotics and steroids Started on daily azithromycin in July 2020 and nebulizer therapy  Interim History Stable on chronic azithromycin.  She feels that this is helping She has not had any more exacerbations for the past year Has chronic dyspnea on exertion.  Has mild hearing loss which preceded initiation of azithromycin.  She does not feel that the azithromycin made her hearing worse.  Outpatient Encounter Medications as of 01/19/2020  Medication Sig  . amLODipine (NORVASC) 5 MG tablet Take 10 mg by mouth daily before breakfast.   . azithromycin (ZITHROMAX) 250 MG tablet Take 1 tablet by mouth once daily  . Cholecalciferol (VITAMIN D3) 2000 units capsule Take 2,000 Units by mouth every 14 (fourteen) days. Once every 2-3 weeks (when she remembers)  . ipratropium-albuterol (DUONEB) 0.5-2.5 (3) MG/3ML SOLN Take 3 mLs by nebulization every 4 (four) hours as needed.  Marland Kitchen levothyroxine (SYNTHROID) 88 MCG tablet Take 88 mcg by mouth daily.  Marland Kitchen  losartan (COZAAR) 100 MG tablet Take 50 mg by mouth daily before breakfast.   . meloxicam (MOBIC) 15 MG tablet Take 15 mg by mouth daily before breakfast.   . nicotine (NICOTROL) 10 MG inhaler Nicotrol 10 mg inhalation cartridge  USE 1 CARTRIDGE 16 TIMES A DAY AS NEEDED  . omeprazole (PRILOSEC) 20 MG capsule Take 20 mg by mouth daily before breakfast.   . Omeprazole-Sodium Bicarbonate (ZEGERID OTC) 20-1100 MG CAPS capsule Take 1 capsule by mouth daily before breakfast.  . pravastatin (PRAVACHOL) 20 MG tablet Take 40 mg by mouth daily before breakfast.   . PROAIR HFA 108 (90 Base) MCG/ACT inhaler INHALE 1 TO 2 PUFFS BY MOUTH EVERY 6 HOURS AS NEEDED FOR WHEEZING FOR SHORTNESS OF BREATH  . Probiotic Product ([place]) Take by mouth.  . TRELEGY ELLIPTA 100-62.5-25 MCG/INH AEPB INHALE 1 PUFF ONCE DAILY  . [DISCONTINUED] Fluticasone-Umeclidin-Vilant (TRELEGY ELLIPTA) 100-62.5-25 MCG/INH AEPB Inhale 1 puff into the lungs daily. (Patient not taking: Reported on 01/19/2020)   No facility-administered encounter medications on file as of 01/19/2020.   Physical Exam: Blood pressure 126/70, pulse 77, temperature 98.4 F (36.9 C), temperature source Temporal, height 5\' 4"  (1.626 m), weight 130 lb 9.6 oz (59.2 kg), SpO2 97 %. Gen:      No acute distress HEENT:  EOMI, sclera anicteric Neck:     No masses; no thyromegaly Lungs:    Clear to auscultation bilaterally; normal respiratory effort CV:         Regular rate and rhythm; no murmurs Abd:      +  bowel sounds; soft, non-tender; no palpable masses, no distension Ext:    No edema; adequate peripheral perfusion Skin:      Warm and dry; no rash Neuro: alert and oriented x 3 Psych: normal mood and affect  Data Reviewed: Imaging CT chest 02/26/15- mild left lower lobe atelectasis, moderate emphysematous changes.  CT chest 08/01/16- moderate emphysematous changes.  No suspicious lung nodules or mass.  Chronic scarring in the right lower  lobe. I have reviewed the images personally  PFTs  02/18/11 FVC 2.42 [117%), FEV1 1.46 (69%), F/F 46, TLC 112%, DLCO 69% Moderate obstructive defect, mild reduction in diffusion capacity.  08/23/15 FVC 2.14 [71%], FEV1 0.94 [41%], F/F 44, TLC 114%, DLCO 57% Severe obstructive defect with moderate reduction in diffusion capacity  Labs: Alpha-1 antitrypsin sensor 10/05/18-205, PI MM  Assessment:   Severe COPD. GOLD stage D Continue trelegy inhaler Unable to afford Daliresp Continue azithromycin to 250 mg daily for anti-inflammatory effect Albuterol rescue inhaler, duo nebs 2-4 times daily.  Refer for screening CTs of the chest.  Health maintenance 12/24/2018-influenza 01/12/2014-Prevnar 04/01/2011-Pneumovax  Plan/Recommendations: - Continue Trelegy - Daily azithromycin - Albuterol, duo nebs  Marshell Garfinkel MD [facility] 01/19/2020, 11:58 AM  CC: Ernestene Kiel, MD

## 2020-02-05 ENCOUNTER — Other Ambulatory Visit: Payer: Self-pay | Admitting: Pulmonary Disease

## 2020-02-05 DIAGNOSIS — J449 Chronic obstructive pulmonary disease, unspecified: Secondary | ICD-10-CM

## 2020-02-13 ENCOUNTER — Other Ambulatory Visit: Payer: Self-pay | Admitting: Internal Medicine

## 2020-02-13 DIAGNOSIS — M810 Age-related osteoporosis without current pathological fracture: Secondary | ICD-10-CM

## 2020-02-19 ENCOUNTER — Other Ambulatory Visit: Payer: Self-pay | Admitting: Pulmonary Disease

## 2020-02-27 DIAGNOSIS — Z1211 Encounter for screening for malignant neoplasm of colon: Secondary | ICD-10-CM | POA: Diagnosis not present

## 2020-02-27 DIAGNOSIS — Z1212 Encounter for screening for malignant neoplasm of rectum: Secondary | ICD-10-CM | POA: Diagnosis not present

## 2020-02-29 DIAGNOSIS — M19071 Primary osteoarthritis, right ankle and foot: Secondary | ICD-10-CM | POA: Diagnosis not present

## 2020-03-21 ENCOUNTER — Other Ambulatory Visit: Payer: Self-pay | Admitting: Pulmonary Disease

## 2020-03-27 ENCOUNTER — Telehealth: Payer: Self-pay | Admitting: Internal Medicine

## 2020-03-27 NOTE — Telephone Encounter (Signed)
Called and spoke with pt and she stated that her daughter went to [place] before christmas and she spent December 24th and 25th with her daughter.  She said that daughter started to not feel good on the 25th and got tested on the 26th and was positive.  The pt stated that she started feeling bad on the 27th with flu like symptoms and body aches and severe fatigue---her test was negative.  I gave her the number to call [facility] to get retested.  Pt said that she will call that number to set this up.  Pt to call back if anything further is needed.

## 2020-03-28 ENCOUNTER — Telehealth: Payer: Self-pay | Admitting: Pulmonary Disease

## 2020-03-28 DIAGNOSIS — U071 COVID-19: Secondary | ICD-10-CM

## 2020-03-28 HISTORY — DX: COVID-19: U07.1

## 2020-03-28 NOTE — Telephone Encounter (Signed)
Spoke with pt who states she has had a positive at home Covid test yesterday as well as other family members in her household. She stated she will not be able to get a PCR until Monday Jan. 3, 2021. She is currently having productive cough with green thick mucus, muscle aches and increased fatigue.  Dr. Tonia Brooms please advise as Dr. Isaiah Serge is out of office.  Thank you

## 2020-03-28 NOTE — Telephone Encounter (Signed)
Spoke with pt, aware of recs.  Pt asked me to call her daughter Summer estes [phone] to let her know what's going on.    Left detailed message on Summer's named voicemail.  Nothing further needed at this time- will close encounter.

## 2020-03-28 NOTE — Telephone Encounter (Signed)
Please refer to [facility] infusion clinic as patient is high risk with severe copd at baseline.   Josephine Igo, DO  Pulmonary Critical Care 03/28/2020 2:22 PM  n

## 2020-05-14 DIAGNOSIS — H6123 Impacted cerumen, bilateral: Secondary | ICD-10-CM | POA: Diagnosis not present

## 2020-05-14 DIAGNOSIS — H6981 Other specified disorders of Eustachian tube, right ear: Secondary | ICD-10-CM | POA: Diagnosis not present

## 2020-05-14 DIAGNOSIS — R42 Dizziness and giddiness: Secondary | ICD-10-CM | POA: Diagnosis not present

## 2020-05-14 DIAGNOSIS — J0100 Acute maxillary sinusitis, unspecified: Secondary | ICD-10-CM | POA: Diagnosis not present

## 2020-05-15 DIAGNOSIS — J342 Deviated nasal septum: Secondary | ICD-10-CM | POA: Diagnosis not present

## 2020-05-15 DIAGNOSIS — H6123 Impacted cerumen, bilateral: Secondary | ICD-10-CM | POA: Insufficient documentation

## 2020-05-15 DIAGNOSIS — R42 Dizziness and giddiness: Secondary | ICD-10-CM | POA: Diagnosis not present

## 2020-05-28 ENCOUNTER — Other Ambulatory Visit: Payer: PPO

## 2020-05-31 DIAGNOSIS — I7090 Unspecified atherosclerosis: Secondary | ICD-10-CM | POA: Diagnosis not present

## 2020-05-31 DIAGNOSIS — Z6823 Body mass index (BMI) 23.0-23.9, adult: Secondary | ICD-10-CM | POA: Diagnosis not present

## 2020-05-31 DIAGNOSIS — J449 Chronic obstructive pulmonary disease, unspecified: Secondary | ICD-10-CM | POA: Diagnosis not present

## 2020-05-31 DIAGNOSIS — Z1331 Encounter for screening for depression: Secondary | ICD-10-CM | POA: Diagnosis not present

## 2020-05-31 DIAGNOSIS — R42 Dizziness and giddiness: Secondary | ICD-10-CM | POA: Diagnosis not present

## 2020-06-04 ENCOUNTER — Encounter: Payer: Medicare HMO | Admitting: Student

## 2020-06-05 ENCOUNTER — Ambulatory Visit (INDEPENDENT_AMBULATORY_CARE_PROVIDER_SITE_OTHER): Payer: PPO | Admitting: Student

## 2020-06-05 ENCOUNTER — Other Ambulatory Visit: Payer: Self-pay

## 2020-06-05 ENCOUNTER — Encounter: Payer: Self-pay | Admitting: Student

## 2020-06-05 VITALS — BP 173/78 | HR 89 | Temp 98.6°F | Ht 64.5 in | Wt 133.2 lb

## 2020-06-05 DIAGNOSIS — I10 Essential (primary) hypertension: Secondary | ICD-10-CM | POA: Diagnosis not present

## 2020-06-05 DIAGNOSIS — H8110 Benign paroxysmal vertigo, unspecified ear: Secondary | ICD-10-CM | POA: Diagnosis not present

## 2020-06-05 MED ORDER — AMLODIPINE BESYLATE 10 MG PO TABS: 10.0000 mg | ORAL_TABLET | Freq: Every day | ORAL | 2 refills | Status: DC

## 2020-06-05 NOTE — Assessment & Plan Note (Addendum)
Patient presenting today with 5 week history of vertigo. She states her initial symptom occurred after she woke up one day and went to the bathroom. After being in the bathroom, she suddenly became very dizzy and felt like the room was spinning. This was associated with nausea. She says this lasted a few seconds and spontaneously resolved. Since then, this occurs at least once daily, often once she stands from a sitting position. She also mentions this occurs when she is sitting or laying down and changes positions. She denies recent illness, although reports she did have COVID-19 in December. Also denies chest pain, dyspnea, hearing loss, tinnitus, blurry vision, headaches, difficulty walking, difficulty speaking, fevers, chills. Of note, patient recently went to previous PCP who decreased amlodipine d/t symptoms, but it has persisted. In addition, she went to ENT where her ears were cleared of cerumen.  A/P: On exam, no focal neurological findings. Patient immediately began feeling dizzy and nauseous during Dix-Hallpike maneuver. Orthostatics were normal. History most consistent with BPPV. Discussed diagnosis with patient, will order physical therapy to help improve functional status, as she was previously much more active. Also gave patient information regarding Epley maneuvers to do at home. Patient to follow-up in four weeks. - Physical therapy referral  - Home Epley maneuvers

## 2020-06-05 NOTE — Progress Notes (Signed)
° °  CC: vertigo, hypertension  HPI:  Ms.Halei Vanessia Bokhari is a 75 y.o. with COPD GOLD class D, hypertension, hypothyroidism, hx breast cancer s/p lumpectomy presenting to clinic today to establish care and discuss recent vertigo as well as management of hypertension.  Please see problem-based list for further details, assessments, and plans.  Past Medical History:  Diagnosis Date   Arthritis    "qwhere; hands, feet, different body parts from time to time" (05/08/2016)   Asthma    Breast mass 07/03/2016   Cancer ([place])    right breast   Complication of anesthesia    "they used an adult tube going down my throat; caused alot of problems; was told to always have pediatric tube placed" (05/08/2016)   COPD (chronic obstructive pulmonary disease) ([place])    GERD (gastroesophageal reflux disease)    History of hiatal hernia    History of radiation therapy 10/07/16- 11/17/16   Right Breast and regional nodes 50 Gy in 25 fractions to breast and at least 45 Gy in 25 fractions to nodes, Right Breast boost 10 Gy in 5 fractions.    Hyperglycemia    Hyperlipidemia    Hypertension    Hypothyroidism    Lump of breast, right    "suppose to have it checked soon" (05/08/2016)   Migraine    "none for years" (05/08/2016)   Osteopenia    Personal history of radiation therapy 2018   Pneumonia "several times"   Status post radioactive iodine thyroid ablation    Thyroid disease    Review of Systems:  As per HPI  Physical Exam:  Vitals:   06/05/20 1032  BP: (!) 173/78  Pulse: 89  Temp: 98.6 F (37 C)  TempSrc: Oral  SpO2: 98%  Weight: 133 lb 3.2 oz (60.4 kg)  Height: 5' 4.5" (1.638 m)    General: Sitting comfortably in chair, no acute distress CV: Regular rate, rhythm. No m/r/g appreciated Pulm: Audible breathing present, no use accessory muscles. Diminished breath sounds bilaterally, no wheezing, rhonchi, or rales. Neuro: Awake, alert, oriented x4. CN in tact. Sensation in  tact. Motor strength 5/5 throughout. No ataxia present. Patient unable to tolerate Dix-Hallpike d/t dizziness.  Assessment & Plan:   See Encounters Tab for problem based charting.  Patient discussed with Dr. Jimmye Norman

## 2020-06-05 NOTE — Assessment & Plan Note (Signed)
BP Readings from Last 3 Encounters:  06/05/20 (!) 173/78  01/19/20 124/68  12/15/19 140/74   Patient previously controlled on home regimen. Recently decreased amlodipine given dizziness. However, orthostatics in office today were normal and her blood pressure improved after sitting for five minutes (systolic 034-917'H). Will increase amlodipine back to original dose and will follow-up with patient in 4 weeks. - Increase amlodipine 10mg  daily

## 2020-06-05 NOTE — Patient Instructions (Addendum)
Ms. Stoffers,  It was a pleasure seeing you today!  Today we discussed the vertigo you have been experiencing as well as your blood pressure. I believe your vertigo is due to BPPV (benign paroxysmal positional vertigo). This can be treated with physical therapy. I have given you a few handouts on maneuvers you can do at home. I have also added a referral for physical therapy.  In addition, I would like to increase your amlodipine to 10mg  for better blood pressure control. We will re-check it again in about a month with Dr. Jimmye Norman.   We look forward to seeing you next time. Please call our clinic at [phone] if you have any questions or concerns. The best time to call is Monday-Friday from 9am-4pm, but there is someone available 24/7 at the same number. If you need medication refills, please notify your pharmacy one week in advance and they will send [place] a request.  Thank you for letting us take part in your care. Wishing you the best!  Thank you, Dr. Sanjuan Dame, MD

## 2020-06-12 NOTE — Progress Notes (Signed)
Internal Medicine Clinic Attending  I saw and evaluated the patient.  I personally confirmed the key portions of the history and exam documented by Dr. Braswell and I reviewed pertinent patient test results.  The assessment, diagnosis, and plan were formulated together and I agree with the documentation in the resident's note.  

## 2020-06-18 ENCOUNTER — Telehealth: Payer: Self-pay

## 2020-06-18 NOTE — Telephone Encounter (Signed)
Called patient and transferred the patient directly to [facility] to sch her appointment.  Appt has been sch for tomorrow 06/19/2020.

## 2020-06-18 NOTE — Telephone Encounter (Signed)
Referral was placed in [facility] Re[facility] for Vestibular PT on 06/05/2020. Will route to Referral Team for status update.

## 2020-06-18 NOTE — Telephone Encounter (Signed)
Requesting to speak with a nurse about getting home health PT. Please call pt back.

## 2020-06-19 ENCOUNTER — Ambulatory Visit: Payer: PPO | Attending: Internal Medicine | Admitting: Physical Therapy

## 2020-06-19 ENCOUNTER — Encounter: Payer: Self-pay | Admitting: Physical Therapy

## 2020-06-19 ENCOUNTER — Other Ambulatory Visit: Payer: Self-pay

## 2020-06-19 DIAGNOSIS — R2681 Unsteadiness on feet: Secondary | ICD-10-CM | POA: Diagnosis not present

## 2020-06-19 DIAGNOSIS — R42 Dizziness and giddiness: Secondary | ICD-10-CM | POA: Diagnosis not present

## 2020-06-19 DIAGNOSIS — H8111 Benign paroxysmal vertigo, right ear: Secondary | ICD-10-CM | POA: Insufficient documentation

## 2020-06-19 NOTE — Therapy (Signed)
[facility] [address] [place], [place], [address] Phone: [phone]   Fax:  [phone]  Physical Therapy Evaluation  Patient Details  Name: Linda [name] MRN: [id number] Date of Birth: 1945-10-14 Referring Provider (PT): Angelica Pou, MD   Encounter Date: 06/19/2020   PT End of Session - 06/19/20 1455    Visit Number 1    Number of Visits 5    Date for PT Re-Evaluation 07/17/20    Authorization Type [employer] - Medicare guidelines    PT Start Time 4174    PT Stop Time 1452    PT Time Calculation (min) 47 min    Activity Tolerance Patient tolerated treatment well    Behavior During Therapy [facility] for tasks assessed/performed           Past Medical History:  Diagnosis Date  . Arthritis    "qwhere; hands, feet, different body parts from time to time" (05/08/2016)  . Asthma   . Breast mass 07/03/2016  . Cancer [facility])    right breast  . Complication of anesthesia    "they used an adult tube going down my throat; caused alot of problems; was told to always have pediatric tube placed" (05/08/2016)  . COPD (chronic obstructive pulmonary disease) ([place])   . GERD (gastroesophageal reflux disease)   . History of hiatal hernia   . History of radiation therapy 10/07/16- 11/17/16   Right Breast and regional nodes 50 Gy in 25 fractions to breast and at least 45 Gy in 25 fractions to nodes, Right Breast boost 10 Gy in 5 fractions.   . Hyperglycemia   . Hyperlipidemia   . Hypertension   . Hypothyroidism   . Lump of breast, right    "suppose to have it checked soon" (05/08/2016)  . Migraine    "none for years" (05/08/2016)  . Osteopenia   . Personal history of radiation therapy 2018  . Pneumonia "several times"  . Status post radioactive iodine thyroid ablation   . Thyroid disease     Past Surgical History:  Procedure Laterality Date  . 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  APPENDECTOMY  1963  . BREAST BIOPSY Right 07/09/2016   malignant  . BREAST LUMPECTOMY Right 08/14/2016  . BREAST LUMPECTOMY WITH AXILLARY LYMPH NODE DISSECTION Right 08/14/2016   Procedure: RIGHT BREAST LUMPECTOMY WITH AXILLARY LYMPH NODE DISSECTION;  Surgeon: Stark Klein, MD;  Location: [facility];  Service: General;  Laterality: Right;  . DILATION AND CURETTAGE OF UTERUS  "several"   "when I was young"  . INGUINAL HERNIA REPAIR Bilateral 2000  . MASS EXCISION Right 08/14/2016   Procedure: EXCISION RIGHT ARM MASS 2X3CM;  Surgeon: Stark Klein, MD;  Location: [facility];  Service: General;  Laterality: Right;  . POLYPECTOMY     "vocal cord"  . TONSILLECTOMY    . TOTAL HIP ARTHROPLASTY Left 05/09/2016   Procedure: TOTAL HIP ARTHROPLASTY ANTERIOR APPROACH;  Surgeon: Paralee Cancel, MD;  Location: [facility];  Service: Orthopedics;  Laterality: Left;  . TUBAL LIGATION    . VAGINAL HYSTERECTOMY  1979    There were no vitals filed for this visit.    Subjective Assessment - 06/19/20 1414    Subjective Pt reports symptoms bginnning a little over a month ago.  She got out of bed to go to the bathroom when she experienced a sudden onset of dizziness shes describes as a spinning sensation.  She states it lasted less than 5 minutes.  She has never experienced this sensation before.  She has been having daily episode since then that feel more "wavy" and she often gets nauseous.  Symptoms last a few seconds.  She saw an ENT where she had wax removed from her ear but this did not improve her symptoms.  Symptoms occur when she bends over or looks up.  The exercises perscribed from her MD have not helped.  She reports no changes in vision.  Hearing has improved since seeing her ENT.  Intermittent headaches.  She had a recent sinus infection that was treated pharmacologically.  She reports she is fearful of falling due to increase in dizziness.    Patient Stated Goals Spinning to stop and to feel normal again    Currently in  Pain? No/denies              [facility] PT Assessment - 06/19/20 1422      Assessment   Medical Diagnosis vertigo    Referring Provider (PT) Angelica Pou, MD    Onset Date/Surgical Date 06/05/20   referral date     Precautions   Precautions Other (comment)    Precaution Comments PMH: RUE restricted for BP, OA, asthma, R breast CA with radiation, COPD, HLD, HTN, hypothyroid, migraine, osteopenia, PNA, COVID-19, fall with L hip fracture      Balance Screen   Has the patient fallen in the past 6 months No    Has the patient had a decrease in activity level because of a fear of falling?  No    Is the patient reluctant to leave their home because of a fear of falling?  No      Prior Function   Level of Independence Independent    Vocation Retired      Observation/Other Assessments   Focus on Therapeutic Outcomes (FOTO)  Dizziness Functional Status: 49%; Dizziness Positional Status: 56.4%      ROM / Strength   AROM / PROM / Strength AROM;Strength      AROM   Overall AROM  Deficits    Overall AROM Comments Limited in all cervical neck ROM; pain with lateral side bend R>L; dizziness with ext      Strength   Overall Strength Deficits    Overall Strength Comments UE WFL; Decreased strength LLE hip felxion 3+/5, knee ext 4-/5                  Vestibular Assessment - 06/19/20 1427      Symptom Behavior   Subjective history of current problem Insidious onset about 1 month ago    Type of Dizziness  Spinning;Vertigo    Frequency of Dizziness Daily    Duration of Dizziness a few seconds    Symptom Nature Positional   bending, looking up, rolling onto R   Aggravating Factors Looking up to the ceiling;Sitting with head tilted back;Rolling to right;Forward bending    Relieving Factors Rest    Progression of Symptoms No change since onset    History of similar episodes None      Oculomotor Exam   Oculomotor Alignment Normal    Ocular ROM WFL    Spontaneous Absent     Gaze-induced  Absent    Smooth Pursuits Intact    Saccades Intact      Oculomotor Exam-Fixation Suppressed    Left Head Impulse negative    Right Head Impulse negative      Vestibulo-Ocular  Reflex   VOR to Slow Head Movement Normal    VOR Cancellation Normal      Positional Testing   Dix-Hallpike Dix-Hallpike Right;Dix-Hallpike Left    Horizontal Canal Testing Horizontal Canal Right;Horizontal Canal Left      Dix-Hallpike Right   Dix-Hallpike Right Duration 5 seconds    Dix-Hallpike Right Symptoms Other (comment)   Unable to assess eyes due to pt closing eyes; severe vertigo     Dix-Hallpike Left   Dix-Hallpike Left Duration 0    Dix-Hallpike Left Symptoms No nystagmus      Horizontal Canal Right   Horizontal Canal Right Symptoms Other (comment)   pt felt woozy     Horizontal Canal Left   Horizontal Canal Left Duration 0    Horizontal Canal Left Symptoms Normal              Objective measurements completed on examination: See above findings.        Vestibular Treatment/Exercise - 06/19/20 1441      Vestibular Treatment/Exercise   Vestibular Treatment Provided Canalith Repositioning    Canalith Repositioning Epley Manuever Right       EPLEY MANUEVER RIGHT   Number of Reps  2    Overall Response Improved Symptoms    Response Details  Symptom duration decreased and no sensations of vertigo noted only nausea                 PT Education - 06/19/20 1454    Education Details Not to perform eply maneuver at home, sleeping on L side, onset of symptoms following treatment    Person(s) Educated Patient    Methods Explanation    Comprehension Verbalized understanding            PT Short Term Goals - 06/19/20 1504      PT SHORT TERM GOAL #1   Title All STG are LTG             PT Long Term Goals - 06/19/20 1504      PT LONG TERM GOAL #1   Title Pt will be able to perform bed mobility, bending forwards to the ground and looking up without  symptoms of vertigo/dizziness    Time 4    Period Weeks    Status New    Target Date 07/17/20      PT LONG TERM GOAL #2   Title Pt will be independent with final HEP    Time 4    Period Weeks    Status New    Target Date 07/17/20      PT LONG TERM GOAL #3   Title Pt will improve FGA by 4 points to show clinically significant change in balance and function    Baseline TBD    Time 4    Period Weeks    Status New    Target Date 07/17/20      PT LONG TERM GOAL #4   Title Pt will increase Dizziness Functional status to >/= 60% and will improve Dizziness Positional Status by 5 points    Baseline DFS: 49, DPS: 56.4    Time 4    Period Weeks    Status New    Target Date 07/17/20                  Plan - 06/19/20 1457    Clinical Impression Statement 75 yo female reports to clinic after insidious onset of vertigo about 1 month ago.  Symptoms  have been persistent since onset and are postionally based including bending over, looking up and rolling onto her R side.  Symptoms last less than 10 seconds and include feelings of wooziness, instability and sometimes nausea.  Objective testing significant for positive R posterior canal BPPV, decreased cervical ROM, and decreased balance.  Cannalith repositioning included R eply maneuver that decreased severity of symptoms.  Pt would benefit from skilled physical therapy to address limitations and return to functional independence.    Personal Factors and Comorbidities Comorbidity 3+;Social Background    Comorbidities PMH: fall with L hip fracture, OA, asthma, R breast CA with radiation, COPD, HLD, HTN, hypothyroid, migraine, osteopenia, PNA, COVID-19    Examination-Activity Limitations Bed Mobility;Bend;Lift;Locomotion Level;Reach Overhead;Sleep    Examination-Participation Restrictions Cleaning;Community Activity;Laundry    Stability/Clinical Decision Making Evolving/Moderate complexity    Clinical Decision Making Moderate    Rehab  Potential Good    PT Frequency 1x / week    PT Duration 4 weeks    PT Treatment/Interventions Canalith Repostioning;Gait training;Stair training;Therapeutic activities;Functional mobility training;Therapeutic exercise;Neuromuscular re-education;Patient/family education;Manual techniques;Vestibular;ADLs/Self Care Home Management;Balance training;Passive range of motion;Cryotherapy;Moist Heat    PT Next Visit Plan Reassess R posterior canal and treat as indicated, assess positional provocations and balance: MSQ, FGA, initiate HEP as indicated    Consulted and Agree with Plan of Care Patient           Patient will benefit from skilled therapeutic intervention in order to improve the following deficits and impairments:  Decreased balance,Decreased range of motion,Decreased strength,Difficulty walking,Dizziness  Visit Diagnosis: BPPV (benign paroxysmal positional vertigo), right  Dizziness and giddiness  Unsteadiness on feet     Problem List Patient Active Problem List   Diagnosis Date Noted  . BPPV (benign paroxysmal positional vertigo) 06/05/2020  . Hypertension 06/05/2020  . Former smoker 05/28/2018  . Breast cancer metastasized to axillary lymph node, right ([place]) 08/14/2016  . Carcinoma of upper-outer quadrant of right breast in female, estrogen receptor positive ([place]) 07/25/2016  . Hypothyroidism 05/09/2016  . Closed left femoral fracture ([employer]) 05/08/2016  . COPD, group D, by GOLD 2017 classification ([place]) 01/28/2011    Yetta Numbers, SPT 06/19/2020, 4:48 PM  [facility] [address] [place], [place], [address] Phone: [phone]   Fax:  [phone]  Name: Linda [name] MRN: [id number] Date of Birth: [birth year]

## 2020-06-28 ENCOUNTER — Ambulatory Visit: Payer: PPO | Admitting: Physical Therapy

## 2020-06-28 ENCOUNTER — Other Ambulatory Visit: Payer: Self-pay

## 2020-06-28 ENCOUNTER — Telehealth: Payer: Self-pay | Admitting: Physical Therapy

## 2020-06-28 ENCOUNTER — Encounter: Payer: Self-pay | Admitting: Physical Therapy

## 2020-06-28 DIAGNOSIS — R42 Dizziness and giddiness: Secondary | ICD-10-CM

## 2020-06-28 DIAGNOSIS — H8111 Benign paroxysmal vertigo, right ear: Secondary | ICD-10-CM | POA: Diagnosis not present

## 2020-06-28 DIAGNOSIS — R2681 Unsteadiness on feet: Secondary | ICD-10-CM

## 2020-06-28 NOTE — Patient Instructions (Signed)
    1.  Sit edge of bed, turn your head to the left and lie down quickly to your right side. 2.  Hold for 2 minutes 3.  Bring your legs down and quickly come up and over to lie on your left side, head turned down towards the floor. 3.  Hold for 2 more minutes. 4.  Tuck your chin and sit up slowly, hold the edge of the bed to keep yourself from falling backwards. 5.  Rest for 2 minutes before performing second repetition.  Perform 2 repetitions, 2 times a day.

## 2020-06-28 NOTE — Therapy (Signed)
[facility] [address] [place], [place], [address] Phone: [phone]   Fax:  [phone]  Physical Therapy Treatment  Patient Details  Name: Linda [name] MRN: [id number] Date of Birth: 1945-06-11 Referring Provider (PT): Angelica Pou, MD   Encounter Date: 06/28/2020   PT End of Session - 06/28/20 1452    Visit Number 2    Number of Visits 5    Date for PT Re-Evaluation 07/17/20    Authorization Type [employer] - Medicare guidelines    PT Start Time 4097    PT Stop Time 1450    PT Time Calculation (min) 47 min    Activity Tolerance Patient tolerated treatment well;Other (comment)   increased amounts of nausea   Behavior During Therapy [facility] for tasks assessed/performed           Past Medical History:  Diagnosis Date  . Arthritis    "qwhere; hands, feet, different body parts from time to time" (05/08/2016)  . Asthma   . Breast mass 07/03/2016  . Cancer [facility])    right breast  . Complication of anesthesia    "they used an adult tube going down my throat; caused alot of problems; was told to always have pediatric tube placed" (05/08/2016)  . COPD (chronic obstructive pulmonary disease) ([place])   . GERD (gastroesophageal reflux disease)   . History of hiatal hernia   . History of radiation therapy 10/07/16- 11/17/16   Right Breast and regional nodes 50 Gy in 25 fractions to breast and at least 45 Gy in 25 fractions to nodes, Right Breast boost 10 Gy in 5 fractions.   . Hyperglycemia   . Hyperlipidemia   . Hypertension   . Hypothyroidism   . Lump of breast, right    "suppose to have it checked soon" (05/08/2016)  . Migraine    "none for years" (05/08/2016)  . Osteopenia   . Personal history of radiation therapy 2018  . Pneumonia "several times"  . Status post radioactive iodine thyroid ablation   . Thyroid disease     Past Surgical History:  Procedure Laterality Date  . ANKLE SURGERY Right X 2    fibrocystic tumors come up in my joints  . APPENDECTOMY  1963  . BREAST BIOPSY Right 07/09/2016   malignant  . BREAST LUMPECTOMY Right 08/14/2016  . BREAST LUMPECTOMY WITH AXILLARY LYMPH NODE DISSECTION Right 08/14/2016   Procedure: RIGHT BREAST LUMPECTOMY WITH AXILLARY LYMPH NODE DISSECTION;  Surgeon: Stark Klein, MD;  Location: [facility];  Service: General;  Laterality: Right;  . DILATION AND CURETTAGE OF UTERUS  "several"   "when I was young"  . INGUINAL HERNIA REPAIR Bilateral 2000  . MASS EXCISION Right 08/14/2016   Procedure: EXCISION RIGHT ARM MASS 2X3CM;  Surgeon: Stark Klein, MD;  Location: [facility];  Service: General;  Laterality: Right;  . POLYPECTOMY     "vocal cord"  . TONSILLECTOMY    . TOTAL HIP ARTHROPLASTY Left 05/09/2016   Procedure: TOTAL HIP ARTHROPLASTY ANTERIOR APPROACH;  Surgeon: Paralee Cancel, MD;  Location: [facility];  Service: Orthopedics;  Laterality: Left;  . TUBAL LIGATION    . VAGINAL HYSTERECTOMY  1979    There were no vitals filed for this visit.   Subjective Assessment - 06/28/20 1403    Subjective Pt repots she is feeling dizzy as usual.  She felt groggy after last session but no symptoms of dizziness occured.  She began feeling symptoms again the following morning  when she woke up.  Every morning since then she has felt dizzy and symptoms are worse if she bends to pick something up and leans back in her chair.  She says her neck has been a litte sore since last session.  [place] the mountains April 12th and was wondering about treatments and planning for that.    Patient Stated Goals Spinning to stop and to feel normal again    Currently in Pain? No/denies                   Vestibular Assessment - 06/28/20 1419      Positional Testing   Dix-Hallpike Dix-Hallpike Right    Sidelying Test Sidelying Right;Sidelying Left      Dix-Hallpike Right   Dix-Hallpike Right Duration Attempted to complete but pt unable to tolerate positon      Sidelying  Right   Sidelying Right Duration 15sec    Sidelying Right Symptoms Upbeat, right rotatory nystagmus      Sidelying Left   Sidelying Left Duration 0    Sidelying Left Symptoms No nystagmus                     Vestibular Treatment/Exercise - 06/28/20 1420      Vestibular Treatment/Exercise   Canalith Repositioning Semont Procedure Right Posterior       EPLEY MANUEVER RIGHT   Number of Reps  --    Overall Response --    Response Details  --      Semont Procedure Right Posterior   Number of Reps  3    Overall Response  Improved Symptoms    Response Details  Significant nausea noted; overall duration decreased after each rep - discussed exercise for HEP                 PT Education - 06/28/20 1450    Education Details Semont added to HEP, vestibulo-ocular reflex, doing exercises at the gym that have back support/limit fast movements and bending/rotating quickly    Person(s) Educated Patient    Methods Explanation    Comprehension Verbalized understanding            PT Short Term Goals - 06/19/20 1504      PT SHORT TERM GOAL #1   Title All STG are LTG             PT Long Term Goals - 06/19/20 1504      PT LONG TERM GOAL #1   Title Pt will be able to perform bed mobility, bending forwards to the ground and looking up without symptoms of vertigo/dizziness    Time 4    Period Weeks    Status New    Target Date 07/17/20      PT LONG TERM GOAL #2   Title Pt will be independent with final HEP    Time 4    Period Weeks    Status New    Target Date 07/17/20      PT LONG TERM GOAL #3   Title Pt will improve FGA by 4 points to show clinically significant change in balance and function    Baseline TBD    Time 4    Period Weeks    Status New    Target Date 07/17/20      PT LONG TERM GOAL #4   Title Pt will increase Dizziness Functional status to >/= 60% and will improve Dizziness Positional Status by 5  points    Baseline DFS: 49, DPS: 56.4     Time 4    Period Weeks    Status New    Target Date 07/17/20                 Plan - 06/28/20 1454    Clinical Impression Statement Pt unable to tolerate dix-hallpike positoinal testing due to severity of symptoms.  Sidelying testing was less severe and positive for R posterior canal and negative for L posteiror canal involvement.  Cannalith repositioning included semont exercises.  Moderate reduction in overall symptom duration but pt still experienced significant amounts of nausea.  Will continue to assess BPPV and treat as indicated.    Personal Factors and Comorbidities Comorbidity 3+;Social Background    Comorbidities PMH: fall with L hip fracture, OA, asthma, R breast CA with radiation, COPD, HLD, HTN, hypothyroid, migraine, osteopenia, PNA, COVID-19    Examination-Activity Limitations Bed Mobility;Bend;Lift;Locomotion Level;Reach Overhead;Sleep    Examination-Participation Restrictions Cleaning;Community Activity;Laundry    Stability/Clinical Decision Making Evolving/Moderate complexity    Rehab Potential Good    PT Frequency 1x / week    PT Duration 4 weeks    PT Treatment/Interventions Canalith Repostioning;Gait training;Stair training;Therapeutic activities;Functional mobility training;Therapeutic exercise;Neuromuscular re-education;Patient/family education;Manual techniques;Vestibular;ADLs/Self Care Home Management;Balance training;Passive range of motion;Cryotherapy;Moist Heat    PT Next Visit Plan Does pt have Zofran today for nausea?  How did semont exercises at home go?  Reassess R posterior canal and treat as indicated (dix-hallpike preferred but may need to do sidelying), assess positional provocations and balance: MSQ, FGA, initiate HEP as indicated    Consulted and Agree with Plan of Care Patient           Patient will benefit from skilled therapeutic intervention in order to improve the following deficits and impairments:  Decreased balance,Decreased range of  motion,Decreased strength,Difficulty walking,Dizziness  Visit Diagnosis: BPPV (benign paroxysmal positional vertigo), right  Dizziness and giddiness  Unsteadiness on feet     Problem List Patient Active Problem List   Diagnosis Date Noted  . BPPV (benign paroxysmal positional vertigo) 06/05/2020  . Hypertension 06/05/2020  . Former smoker 05/28/2018  . Breast cancer metastasized to axillary lymph node, right ([place]) 08/14/2016  . Carcinoma of upper-outer quadrant of right breast in female, estrogen receptor positive ([place]) 07/25/2016  . Hypothyroidism 05/09/2016  . Closed left femoral fracture ([place]) 05/08/2016  . COPD, group D, by GOLD 2017 classification ([place]) 01/28/2011    Yetta Numbers, SPT 06/28/2020, 3:20 PM  [facility] [address] [place], [place], [address] Phone: [phone]   Fax:  [phone]  Name: Linda [name] MRN: [id number] Date of Birth: 01/01/1946

## 2020-06-28 NOTE — Telephone Encounter (Signed)
Hello Dr. Jimmye Norman, We have been working with Linda [name] to treat her BPPV.  She has been limited in her ability to tolerate the treatments due to nausea.  If it is appropriate, would it be possible to call in a prescription for Zofran to help her tolerate the Epley maneuver?  Thank you! Rico Junker, PT, DPT 06/28/20    2:22 PM

## 2020-06-29 ENCOUNTER — Encounter: Payer: Self-pay | Admitting: *Deleted

## 2020-06-29 ENCOUNTER — Other Ambulatory Visit: Payer: Self-pay

## 2020-06-29 NOTE — Telephone Encounter (Signed)
Patient was seen on 06/05/20 by Dr. Collene Gobble and Amlodipine was increased to 10mg  daily with patient instructed to return in 1 month to f/u with PCP.  Pt made f/u w/ PCP on 07/26/20  TC to patient, she states she is going to run out of all of the requested meds in 1 week, 1st available appt w/ PCP was 07/26/20.  She is also requesting something for nausea d/t her vertigo and her traveling to the mountains soon.  States she has transferred her care over to Dr. Jimmye Norman from [facility].  Will forward refill request to PCP to advise. SChaplin, RN,BSN

## 2020-06-29 NOTE — Telephone Encounter (Signed)
Need refill on pravastatin (PRAVACHOL) 20 MG tablet  levothyroxine (SYNTHROID) 88 MCG tablet meloxicam (MOBIC) 15 MG tablet  losartan (COZAAR) 100 MG tablet ;pt contact [facility] [address] [place] - [address]  Pt needs a prescription for nausea [phone]

## 2020-07-02 NOTE — Telephone Encounter (Signed)
Hi Linda [name], I tried to approve these and losartan was flagged as not reimbursable on her insurance.  Do you know if she has changed insurance? Or does she pay out of pocket?

## 2020-07-02 NOTE — Telephone Encounter (Signed)
Requesting to speak with a nurse about meds. Please call pt back.  

## 2020-07-02 NOTE — Telephone Encounter (Signed)
And does she also need amlodipine? I tried substituting candesartan for losartan before checking to see what she wants to do - now I can't figure out how to cancel the order.Marland KitchenMarland Kitchen

## 2020-07-03 ENCOUNTER — Ambulatory Visit: Payer: PPO | Attending: Internal Medicine | Admitting: Physical Therapy

## 2020-07-03 ENCOUNTER — Other Ambulatory Visit: Payer: Self-pay

## 2020-07-03 DIAGNOSIS — H8111 Benign paroxysmal vertigo, right ear: Secondary | ICD-10-CM | POA: Diagnosis not present

## 2020-07-03 DIAGNOSIS — R42 Dizziness and giddiness: Secondary | ICD-10-CM | POA: Insufficient documentation

## 2020-07-03 DIAGNOSIS — R2681 Unsteadiness on feet: Secondary | ICD-10-CM | POA: Insufficient documentation

## 2020-07-03 MED ORDER — MELOXICAM 15 MG PO TABS
15.0000 mg | ORAL_TABLET | Freq: Every day | ORAL | 2 refills | Status: DC
Start: 2020-07-03 — End: 2020-07-26

## 2020-07-03 MED ORDER — LEVOTHYROXINE SODIUM 88 MCG PO TABS: 88.0000 ug | ORAL_TABLET | Freq: Every day | ORAL | 2 refills | Status: DC

## 2020-07-03 MED ORDER — LOSARTAN POTASSIUM 100 MG PO TABS: 100.0000 mg | ORAL_TABLET | Freq: Every day | ORAL | 2 refills | Status: DC

## 2020-07-03 MED ORDER — PRAVASTATIN SODIUM 40 MG PO TABS: 40.0000 mg | ORAL_TABLET | Freq: Every day | ORAL | 2 refills | Status: DC

## 2020-07-03 NOTE — Addendum Note (Signed)
Addended by: Dorian Pod A on: 07/03/2020 11:46 AM   Modules accepted: Orders

## 2020-07-03 NOTE — Therapy (Signed)
[facility] [address] [place], [place], [address] Phone: [phone]   Fax:  [phone]  Physical Therapy Treatment  Patient Details  Name: Marilynn Ekstein MRN: [id number] Date of Birth: [birth year] Referring Provider (PT): Angelica Pou, MD   Encounter Date: 07/03/2020   PT End of Session - 07/03/20 1638    Visit Number 3    Number of Visits 5    Date for PT Re-Evaluation 07/17/20    Authorization Type [employer] - Medicare guidelines    PT Start Time 1536    PT Stop Time 1611    PT Time Calculation (min) 35 min    Activity Tolerance Patient tolerated treatment well    Behavior During Therapy [facility] for tasks assessed/performed           Past Medical History:  Diagnosis Date  . Arthritis    "qwhere; hands, feet, different body parts from time to time" (05/08/2016)  . Asthma   . Breast mass 07/03/2016  . Cancer [facility])    right breast  . Complication of anesthesia    "they used an adult tube going down my throat; caused alot of problems; was told to always have pediatric tube placed" (05/08/2016)  . COPD (chronic obstructive pulmonary disease) ([place])   . GERD (gastroesophageal reflux disease)   . History of hiatal hernia   . History of radiation therapy 10/07/16- 11/17/16   Right Breast and regional nodes 50 Gy in 25 fractions to breast and at least 45 Gy in 25 fractions to nodes, Right Breast boost 10 Gy in 5 fractions.   . Hyperglycemia   . Hyperlipidemia   . Hypertension   . Hypothyroidism   . Lump of breast, right    "suppose to have it checked soon" (05/08/2016)  . Migraine    "none for years" (05/08/2016)  . Osteopenia   . Personal history of radiation therapy 2018  . Pneumonia "several times"  . Status post radioactive iodine thyroid ablation   . Thyroid disease     Past Surgical History:  Procedure Laterality Date  . 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  APPENDECTOMY  1963  . BREAST BIOPSY Right 07/09/2016   malignant  . BREAST LUMPECTOMY Right 08/14/2016  . BREAST LUMPECTOMY WITH AXILLARY LYMPH NODE DISSECTION Right 08/14/2016   Procedure: RIGHT BREAST LUMPECTOMY WITH AXILLARY LYMPH NODE DISSECTION;  Surgeon: Stark Klein, MD;  Location: [facility];  Service: General;  Laterality: Right;  . DILATION AND CURETTAGE OF UTERUS  "several"   "when I was young"  . INGUINAL HERNIA REPAIR Bilateral 2000  . MASS EXCISION Right 08/14/2016   Procedure: EXCISION RIGHT ARM MASS 2X3CM;  Surgeon: Stark Klein, MD;  Location: [facility];  Service: General;  Laterality: Right;  . POLYPECTOMY     "vocal cord"  . TONSILLECTOMY    . TOTAL HIP ARTHROPLASTY Left 05/09/2016   Procedure: TOTAL HIP ARTHROPLASTY ANTERIOR APPROACH;  Surgeon: Paralee Cancel, MD;  Location: [facility];  Service: Orthopedics;  Laterality: Left;  . TUBAL LIGATION    . VAGINAL HYSTERECTOMY  1979    There were no vitals filed for this visit.   Subjective Assessment - 07/03/20 1542    Subjective Did not hear from the doctor's office about anti-nausea medication.  Did [place] at home each day and was able to lie back with head turned to the right.  Would like to try and clear  the dizziness with the dix-hallpike today.    Patient Stated Goals Spinning to stop and to feel normal again    Currently in Pain? No/denies                   Vestibular Assessment - 07/03/20 1544      Positional Testing   Dix-Hallpike Dix-Hallpike Right      Dix-Hallpike Right   Dix-Hallpike Right Duration improved tolerance today; 10 seconds    Dix-Hallpike Right Symptoms Other (comment)   reported spinning but no nystagmus observed                    Vestibular Treatment/Exercise - 07/03/20 1550      Vestibular Treatment/Exercise   Vestibular Treatment Provided Canalith Repositioning    Canalith Repositioning Epley Manuever Right    Gaze Exercises X1 Viewing Horizontal;X1 Viewing Vertical        EPLEY MANUEVER RIGHT   Number of Reps  2    Overall Response Symptoms Resolved    Response Details  first time through therapist performed with patient and then second time performed modified Epley with pillow behind back for pt to perform at home.      X1 Viewing Horizontal   Foot Position standing feet together    Reps 2    Comments 30 seconds > 60 seconds mild symptoms and imbalance      X1 Viewing Vertical   Foot Position standing feet together    Reps 2    Comments 30 > 60 seconds, mild symptoms                 PT Education - 07/03/20 1637    Education Details updated HEP; educated pt on how to perform self-Epley if she presents with spinning.  VOR every day    Person(s) Educated Patient    Methods Explanation;Demonstration;Handout    Comprehension Verbalized understanding;Returned demonstration            PT Short Term Goals - 06/19/20 1504      PT SHORT TERM GOAL #1   Title All STG are LTG             PT Long Term Goals - 06/19/20 1504      PT LONG TERM GOAL #1   Title Pt will be able to perform bed mobility, bending forwards to the ground and looking up without symptoms of vertigo/dizziness    Time 4    Period Weeks    Status New    Target Date 07/17/20      PT LONG TERM GOAL #2   Title Pt will be independent with final HEP    Time 4    Period Weeks    Status New    Target Date 07/17/20      PT LONG TERM GOAL #3   Title Pt will improve FGA by 4 points to show clinically significant change in balance and function    Baseline TBD    Time 4    Period Weeks    Status New    Target Date 07/17/20      PT LONG TERM GOAL #4   Title Pt will increase Dizziness Functional status to >/= 60% and will improve Dizziness Positional Status by 5 points    Baseline DFS: 49, DPS: 56.4    Time 4    Period Weeks    Status New    Target Date 07/17/20  Plan - 07/03/20 1638    Clinical Impression Statement Pt was able to perform  habituation and self treatment at home.  Able to tolerate assessment and treatment of R posterior canal BPPV with dix-hallpike and CRM with significant improvement in symptoms today.  Educated pt on self maneuver for home and initiated VOR training in standing.  If pt continues to experience significant relief and improvement pt may be ready for D/C next session.    Personal Factors and Comorbidities Comorbidity 3+;Social Background    Comorbidities PMH: fall with L hip fracture, OA, asthma, R breast CA with radiation, COPD, HLD, HTN, hypothyroid, migraine, osteopenia, PNA, COVID-19    Examination-Activity Limitations Bed Mobility;Bend;Lift;Locomotion Level;Reach Overhead;Sleep    Examination-Participation Restrictions Cleaning;Community Activity;Laundry    Stability/Clinical Decision Making Evolving/Moderate complexity    Rehab Potential Good    PT Frequency 1x / week    PT Duration 4 weeks    PT Treatment/Interventions Canalith Repostioning;Gait training;Stair training;Therapeutic activities;Functional mobility training;Therapeutic exercise;Neuromuscular re-education;Patient/family education;Manual techniques;Vestibular;ADLs/Self Care Home Management;Balance training;Passive range of motion;Cryotherapy;Moist Heat    PT Next Visit Plan How was the mountains?  Re-check for BPPV and treat if indicated.  If symptoms are stable, check goals and D/C?    Consulted and Agree with Plan of Care Patient           Patient will benefit from skilled therapeutic intervention in order to improve the following deficits and impairments:  Decreased balance,Decreased range of motion,Decreased strength,Difficulty walking,Dizziness  Visit Diagnosis: BPPV (benign paroxysmal positional vertigo), right  Dizziness and giddiness  Unsteadiness on feet     Problem List Patient Active Problem List   Diagnosis Date Noted  . BPPV (benign paroxysmal positional vertigo) 06/05/2020  . Hypertension 06/05/2020  .  Former smoker 05/28/2018  . Breast cancer metastasized to axillary lymph node, right ([place]) 08/14/2016  . Carcinoma of upper-outer quadrant of right breast in female, estrogen receptor positive ([place]) 07/25/2016  . Hypothyroidism 05/09/2016  . Closed left femoral fracture ([place]) 05/08/2016  . COPD, group D, by GOLD 2017 classification ([place]) 01/28/2011    Rico Junker, PT, DPT 07/03/20    4:42 PM    [facility] [address] [place], [place], [address] Phone: [phone]   Fax:  [phone]  Name: Samara Stankowski MRN: [id number] Date of Birth: June 05, 1945

## 2020-07-03 NOTE — Telephone Encounter (Signed)
Dr. Jimmye Norman,  I spoke with your patient who states she takes Losartan 100mg  daily.  She also states she has been on this for years and has never had a problem getting it paid for by her insurance.  Thank you, Lejla Moeser

## 2020-07-03 NOTE — Patient Instructions (Signed)
Only perform the following exercise if you have SPINNING:  How to Perform the Epley Maneuver The Epley maneuver is an exercise that relieves symptoms of vertigo. Vertigo is the feeling that you or your surroundings are moving when they are not. When you feel vertigo, you may feel like the room is spinning and have trouble walking. Dizziness is a little different than vertigo. When you are dizzy, you may feel unsteady or light-headed. You can do this maneuver at home whenever you have symptoms of vertigo. You can do it up to 3 times a day until your symptoms go away. Even though the Epley maneuver may relieve your vertigo for a few weeks, it is possible that your symptoms will return. This maneuver relieves vertigo, but it does not relieve dizziness. What are the risks? If it is done correctly, the Epley maneuver is considered safe. Sometimes it can lead to dizziness or nausea that goes away after a short time. If you develop other symptoms, such as changes in vision, weakness, or numbness, stop doing the maneuver and call your health care provider. How to perform the Epley maneuver 1. Sit on your bed with your back straight and your legs out straight.  Pillow behind your low back.   2. Turn your head halfway toward the right side. 3. Lie backward quickly with your head turned until you are lying flat on your back. Your head will be supported on the bed but lower than your body 4. Hold this position for 30 seconds. You may experience an attack of vertigo. This is normal. 5. Turn your head to the opposite direction until your unaffected ear is facing the floor. 6. Hold this position for 30 seconds. You may experience an attack of vertigo. This is normal. Hold this position until the vertigo stops. 7. Turn your whole body to the same side as your head. Hold for another 30 seconds. 8. Tuck your chin, Sit back up. .      DO THIS EXERCISE EVERY DAY, 2 TIMES A DAY  Gaze Stabilization: Standing Feet  Together    Feet together, keeping eyes on target on wall __3__ feet away, tilt head down 15-30 and move head side to side for __60__ seconds. Repeat while moving head up and down for __60__ seconds. Do _2___ sessions per day.

## 2020-07-06 ENCOUNTER — Encounter: Payer: Self-pay | Admitting: *Deleted

## 2020-07-06 NOTE — Progress Notes (Signed)

## 2020-07-16 ENCOUNTER — Ambulatory Visit: Payer: PPO | Admitting: Physical Therapy

## 2020-07-16 ENCOUNTER — Encounter: Payer: Self-pay | Admitting: Physical Therapy

## 2020-07-16 ENCOUNTER — Other Ambulatory Visit: Payer: Self-pay

## 2020-07-16 DIAGNOSIS — H8111 Benign paroxysmal vertigo, right ear: Secondary | ICD-10-CM | POA: Diagnosis not present

## 2020-07-16 DIAGNOSIS — R42 Dizziness and giddiness: Secondary | ICD-10-CM

## 2020-07-16 DIAGNOSIS — R2681 Unsteadiness on feet: Secondary | ICD-10-CM

## 2020-07-16 NOTE — Patient Instructions (Signed)
DO THIS EXERCISE EVERY DAY, 2 TIMES A DAY  Gaze Stabilization: Standing Feet Together    Feet together, keeping eyes on target on wall __3__ feet away, tilt head down 15-30 and move head side to side for __60__ seconds. Repeat while moving head up and down for __60__ seconds. Do _2___ sessions per day.   _________________________________________________________________________________________________________________  Only perform the following exercise if you have SPINNING:  How to Perform the Epley Maneuver The Epley maneuver is an exercise that relieves symptoms of vertigo. Vertigo is the feeling that you or your surroundings are moving when they are not. When you feel vertigo, you may feel like the room is spinning and have trouble walking. Dizziness is a little different than vertigo. When you are dizzy, you may feel unsteady or light-headed. You can do this maneuver at home whenever you have symptoms of vertigo. You can do it up to 3 times a day until your symptoms go away. Even though the Epley maneuver may relieve your vertigo for a few weeks, it is possible that your symptoms will return. This maneuver relieves vertigo, but it does not relieve dizziness. What are the risks? If it is done correctly, the Epley maneuver is considered safe. Sometimes it can lead to dizziness or nausea that goes away after a short time. If you develop other symptoms, such as changes in vision, weakness, or numbness, stop doing the maneuver and call your health care provider. How to perform the Epley maneuver 1. Sit on your bed with your back straight and your legs out straight.  Pillow behind your low back.   2. Turn your head halfway toward the right side. 3. Lie backward quickly with your head turned until you are lying flat on your back. Your head will be supported on the bed but lower than your body 4. Hold this position for 30 seconds. You may experience an attack of vertigo. This is normal. 5. Turn  your head to the opposite direction until your unaffected ear is facing the floor. 6. Hold this position for 30 seconds. You may experience an attack of vertigo. This is normal. Hold this position until the vertigo stops. 7. Turn your whole body to the same side as your head. Hold for another 30 seconds. 8. Tuck your chin, Sit back up. .     _______________________________________________________________________________________________________________________________________  PERFORM THIS NEXT EXERCISE ONCE SPINNING HAS RESOLVED IF YOU ARE STILL HAVING SOME WOOZY FEELINGS WITH LYING DOWN OR SITTING UP. Habituation - Sit to Side-Lying   Sit on edge of bed. Turn your head to the left, Lie down onto the right side and hold until dizziness stops, plus 20 seconds.  Return to sitting and wait until dizziness stops, plus 20 seconds.  Turn head to the right, lie down onto the left side. Repeat sequence 3 times per session. Do 2 sessions per day.

## 2020-07-16 NOTE — Therapy (Signed)
[facility] [address] [place], [place], [address] Phone: [phone]   Fax:  [phone]  Physical Therapy Treatment  Patient Details  Name: Linda [name] MRN: [id number] Date of Birth: 1946-03-25 Referring Provider (PT): Angelica Pou, MD   Encounter Date: 07/16/2020   PT End of Session - 07/16/20 1315    Visit Number 4    Number of Visits 5    Date for PT Re-Evaluation 07/17/20    Authorization Type [employer] - Medicare guidelines    PT Start Time 1233    PT Stop Time 1315    PT Time Calculation (min) 42 min    Activity Tolerance Patient tolerated treatment well    Behavior During Therapy [facility] for tasks assessed/performed           Past Medical History:  Diagnosis Date  . Arthritis    "qwhere; hands, feet, different body parts from time to time" (05/08/2016)  . Asthma   . Breast mass 07/03/2016  . Cancer [facility])    right breast  . Complication of anesthesia    "they used an adult tube going down my throat; caused alot of problems; was told to always have pediatric tube placed" (05/08/2016)  . COPD (chronic obstructive pulmonary disease) ([place])   . GERD (gastroesophageal reflux disease)   . History of hiatal hernia   . History of radiation therapy 10/07/16- 11/17/16   Right Breast and regional nodes 50 Gy in 25 fractions to breast and at least 45 Gy in 25 fractions to nodes, Right Breast boost 10 Gy in 5 fractions.   . Hyperglycemia   . Hyperlipidemia   . Hypertension   . Hypothyroidism   . Lump of breast, right    "suppose to have it checked soon" (05/08/2016)  . Migraine    "none for years" (05/08/2016)  . Osteopenia   . Personal history of radiation therapy 2018  . Pneumonia "several times"  . Status post radioactive iodine thyroid ablation   . Thyroid disease     Past Surgical History:  Procedure Laterality Date  . 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  APPENDECTOMY  1963  . BREAST BIOPSY Right 07/09/2016   malignant  . BREAST LUMPECTOMY Right 08/14/2016  . BREAST LUMPECTOMY WITH AXILLARY LYMPH NODE DISSECTION Right 08/14/2016   Procedure: RIGHT BREAST LUMPECTOMY WITH AXILLARY LYMPH NODE DISSECTION;  Surgeon: Stark Klein, MD;  Location: [facility];  Service: General;  Laterality: Right;  . DILATION AND CURETTAGE OF UTERUS  "several"   "when I was young"  . INGUINAL HERNIA REPAIR Bilateral 2000  . MASS EXCISION Right 08/14/2016   Procedure: EXCISION RIGHT ARM MASS 2X3CM;  Surgeon: Stark Klein, MD;  Location: [facility];  Service: General;  Laterality: Right;  . POLYPECTOMY     "vocal cord"  . TONSILLECTOMY    . TOTAL HIP ARTHROPLASTY Left 05/09/2016   Procedure: TOTAL HIP ARTHROPLASTY ANTERIOR APPROACH;  Surgeon: Paralee Cancel, MD;  Location: [facility];  Service: Orthopedics;  Laterality: Left;  . TUBAL LIGATION    . VAGINAL HYSTERECTOMY  1979    There were no vitals filed for this visit.   Subjective Assessment - 07/16/20 1236    Subjective Pt reports one [place] that her daughter had to help her from falling down right after driving in the car to the [place].  Did not perfrom home manueveer because dizziness  happened in a restaurant.  All other symptoms have been more wavy.  Still did not recieve any anti-nausea medication.    Patient Stated Goals Spinning to stop and to feel normal again    Currently in Pain? No/denies              [facility] PT Assessment - 07/16/20 1304      Observation/Other Assessments   Focus on Therapeutic Outcomes (FOTO)  DFS: 61, DPS: 45.1               Vestibular Assessment - 07/16/20 1240      Positional Testing   Dix-Hallpike Dix-Hallpike Right;Dix-Hallpike Left      Dix-Hallpike Right   Dix-Hallpike Right Duration <5secs    Dix-Hallpike Right Symptoms No nystagmus      Dix-Hallpike Left   Dix-Hallpike Left Duration 0    Dix-Hallpike Left Symptoms No nystagmus       Positional Sensitivities   Supine to Sitting No dizziness    Right Hallpike Lightheadedness    Up from Right Hallpike Lightheadedness    Up from Left Hallpike No dizziness    Nose to Right Knee No dizziness    Right Knee to Sitting No dizziness    Nose to Left Knee No dizziness    Left Knee to Sitting No dizziness    Head Turning x 5 No dizziness    Head Nodding x 5 Mild dizziness   mild nausea   Pivot Right in Standing Lightheadedness    Pivot Left in Standing Lightheadedness    Rolling Right No dizziness    Rolling Left No dizziness                     Vestibular Treatment/Exercise - 07/16/20 1300      Vestibular Treatment/Exercise   Canalith Repositioning Epley Manuever Right    Habituation Exercises Brandt Daroff       EPLEY MANUEVER RIGHT   Number of Reps  1    Overall Response Symptoms Resolved    Response Details  mild symtpoms when initially when falling to back      [employer]   Number of Reps  1    Symptom Description  No symptoms; education to perfrom at home                 PT Education - 07/16/20 1306    Education Details Final evaluation of LTGs, Brandt-Daroff Manuever, final HEP, discharge plan    Person(s) Educated Patient    Methods Explanation;Handout    Comprehension Verbalized understanding            PT Short Term Goals - 06/19/20 1504      PT SHORT TERM GOAL #1   Title All STG are LTG             PT Long Term Goals - 07/16/20 1302      PT LONG TERM GOAL #1   Title Pt will be able to perform bed mobility, bending forwards to the ground and looking up without symptoms of vertigo/dizziness    Baseline Mild symptoms present with looking upwards and fast movements    Time 4    Period Weeks    Status Partially Met      PT LONG TERM GOAL #2   Title Pt will be independent with final HEP    Time 4    Period Weeks    Status Achieved      PT LONG TERM  GOAL #3   Title Pt will improve FGA by 4 points to show  clinically significant change in balance and function    Baseline TBD    Time 4    Period Weeks    Status Deferred      PT LONG TERM GOAL #4   Title Pt will increase Dizziness Functional status to >/= 60% and will improve Dizziness Positional Status by 5 points    Baseline DFS: 49, DPS: 56.4; DFS: 61, DPS: 45.1    Time 4    Period Weeks    Status Partially Met                 Plan - 07/16/20 1316    Clinical Impression Statement Pt has made significant improvement since beginning therapy.  Pt positive for R posterior canal BPPV due to mild symptoms with positional testing; cannalith repositioning resolved remaining symptoms.  Pt met one of her LTGs and partially met her other two.  Overall pt reports improvement in function and decrease in dizziness symptoms and severity, improvement in Dizziness Functional Scale, and decreased sensitivity to motions including bed mobility, bending and horizontal head turns.  Pt still presents with dizziness during vertical head turns and fast movements, and did not improve her DPS.  HEP includes home manuevers for BPPV and habituation exercises with progressions. Pt is in agreement to continue with dischage with the option to return in the next 4 weeks should she have a severe return of dizziness.  Will continue with full discharge otherwise.    Personal Factors and Comorbidities Comorbidity 3+;Social Background    Comorbidities PMH: fall with L hip fracture, OA, asthma, R breast CA with radiation, COPD, HLD, HTN, hypothyroid, migraine, osteopenia, PNA, COVID-19    Examination-Activity Limitations Bed Mobility;Bend;Lift;Locomotion Level;Reach Overhead;Sleep    Examination-Participation Restrictions Cleaning;Community Activity;Laundry    Stability/Clinical Decision Making Evolving/Moderate complexity    Rehab Potential Good    PT Frequency 1x / week    PT Duration 4 weeks    PT Treatment/Interventions Canalith Repostioning;Gait training;Stair  training;Therapeutic activities;Functional mobility training;Therapeutic exercise;Neuromuscular re-education;Patient/family education;Manual techniques;Vestibular;ADLs/Self Care Home Management;Balance training;Passive range of motion;Cryotherapy;Moist Heat    PT Next Visit Plan Discharge if pt does not return 4 weeks    Consulted and Agree with Plan of Care Patient           Patient will benefit from skilled therapeutic intervention in order to improve the following deficits and impairments:  Decreased balance,Decreased range of motion,Decreased strength,Difficulty walking,Dizziness  Visit Diagnosis: BPPV (benign paroxysmal positional vertigo), right  Dizziness and giddiness  Unsteadiness on feet     Problem List Patient Active Problem List   Diagnosis Date Noted  . BPPV (benign paroxysmal positional vertigo) 06/05/2020  . Hypertension 06/05/2020  . Former smoker 05/28/2018  . Breast cancer metastasized to axillary lymph node, right ([place]) 08/14/2016  . Carcinoma of upper-outer quadrant of right breast in female, estrogen receptor positive ([place]) 07/25/2016  . Hypothyroidism 05/09/2016  . Closed left femoral fracture ([place]) 05/08/2016  . COPD, group D, by GOLD 2017 classification ([place]) 01/28/2011    Yetta Numbers, SPT 07/16/2020, 1:29 PM  [facility] [address] [place], [place], [address] Phone: [phone]   Fax:  [phone]  Name: Onnie Alatorre MRN: [id number] Date of Birth: Apr 23, 1945

## 2020-07-20 NOTE — Progress Notes (Signed)
Things That May Be Affecting Your Health:  Alcohol  Hearing loss  Pain    Depression  Home Safety  Sexual Health   Diabetes  Lack of physical activity  Stress   Difficulty with daily activities  Loneliness  Tiredness   Drug use  Medicines  Tobacco use   Falls  Motor Vehicle Safety  Weight   Food choices  Oral Health  Other    YOUR PERSONALIZED HEALTH PLAN : 1. Schedule your next subsequent Medicare Wellness visit in one year 2. Attend all of your regular appointments to address your medical issues 3. Complete the preventative screenings and services   Annual Wellness Visit   Medicare Covered Preventative Screenings and [place] Men and Women Who How Often Need? Date of Last Service Action  Abdominal Aortic Aneurysm Adults with AAA risk factors Once      Alcohol Misuse and Counseling All Adults Screening once a year if no alcohol misuse. Counseling up to 4 face to face sessions.     Bone Density Measurement  Adults at risk for osteoporosis Once every 2 yrs X think so   May be ordered  Lipid Panel Z13.6 All adults without CV disease Once every 5 yrs x      C  olorectal Cancer   Stool sample or  Colonoscopy All adults 41 and older   Once every year  Every 10 years ??       Depression All Adults Once a year  Today   Diabetes Screening Blood glucose, post glucose load, or GTT Z13.1  All adults at risk  Pre-diabetics  Once per year  Twice per year      Diabetes  Self-Management Training All adults Diabetics 10 hrs first year; 2 hours subsequent years. Requires Copay     Glaucoma  Diabetics  Family history of glaucoma  African Americans 65 yrs +  Hispanic Americans 62 yrs + Annually - requires coppay unknown      Hepatitis C Z72.89 or F19.20  High Risk for HCV  Born between 1945 and 1965  Annually  Once      HIV Z11.4 All adults based on risk  Annually btw ages 104 & 64 regardless of risk  Annually > 65 yrs if at increased  risk      Lung Cancer Screening Asymptomatic adults aged 78-77 with 30 pack yr history and current smoker OR quit within the last 15 yrs Annually Must have counseling and shared decision making documentation before first screen      Medical Nutrition Therapy Adults with   Diabetes  Renal disease  Kidney transplant within past 3 yrs 3 hours first year; 2 hours subsequent years     Obesity and Counseling All adults Screening once a year Counseling if BMI 30 or higher  Today   Tobacco Use Counseling Adults who use tobacco  Up to 8 visits in one year X?    Vaccines Z23  Hepatitis B  Influenza   Pneumonia  Adults   Once  Once every flu season  Two different vaccines separated by one year     Next Annual Wellness Visit People with Medicare Every year  Today     [place] Women Who How Often Need  Date of Last Service Action  Mammogram  Z12.31 Women over 69 One baseline ages 76-39. Annually ager 40 yrs+ x  09/12/19   Pap tests All women Annually if high risk. Every 2 yrs for normal  risk women ?     Screening for cervical cancer with   Pap (Z01.419 nl or Z01.411abnl) &  HPV Z11.51 Women aged 14 to 9 Once every 5 yrs     Screening pelvic and breast exams All women Annually if high risk. Every 2 yrs for normal risk women     Sexually Transmitted Diseases  Chlamydia  Gonorrhea  Syphilis All at risk adults Annually for non pregnant females at increased risk         [place] Men Who How Ofter Need  Date of Last Service Action  Prostate Cancer - DRE & PSA Men over 50 Annually.  DRE might require a copay.        Sexually Transmitted Diseases  Syphilis All at risk adults Annually for men at increased risk      Health Maintenance List Health Maintenance  Topic Date Due  . Hepatitis C Screening  Never done  . COLONOSCOPY (Pts 45-41yrs Insurance coverage will need to be confirmed)  Never done  . COVID-19 Vaccine (3 - Pfizer risk 4-dose  series) 06/12/2019  . INFLUENZA VACCINE  10/29/2020  . TETANUS/TDAP  12/25/2020  . DEXA SCAN  Completed  . PNA vac Low Risk Adult  Completed  . HPV VACCINES  Aged Out

## 2020-07-23 ENCOUNTER — Ambulatory Visit: Payer: PPO | Admitting: Physical Therapy

## 2020-07-25 ENCOUNTER — Encounter: Payer: Self-pay | Admitting: Internal Medicine

## 2020-07-26 ENCOUNTER — Ambulatory Visit (INDEPENDENT_AMBULATORY_CARE_PROVIDER_SITE_OTHER): Payer: PPO | Admitting: Internal Medicine

## 2020-07-26 ENCOUNTER — Encounter: Payer: Self-pay | Admitting: Internal Medicine

## 2020-07-26 VITALS — BP 165/77 | HR 85 | Temp 97.5°F | Ht 64.5 in | Wt 133.0 lb

## 2020-07-26 DIAGNOSIS — H8111 Benign paroxysmal vertigo, right ear: Secondary | ICD-10-CM

## 2020-07-26 DIAGNOSIS — M810 Age-related osteoporosis without current pathological fracture: Secondary | ICD-10-CM

## 2020-07-26 DIAGNOSIS — Z170 Estrogen receptor positive status [ER+]: Secondary | ICD-10-CM

## 2020-07-26 DIAGNOSIS — I10 Essential (primary) hypertension: Secondary | ICD-10-CM

## 2020-07-26 DIAGNOSIS — E890 Postprocedural hypothyroidism: Secondary | ICD-10-CM | POA: Diagnosis not present

## 2020-07-26 DIAGNOSIS — M19071 Primary osteoarthritis, right ankle and foot: Secondary | ICD-10-CM

## 2020-07-26 DIAGNOSIS — J449 Chronic obstructive pulmonary disease, unspecified: Secondary | ICD-10-CM

## 2020-07-26 DIAGNOSIS — C50411 Malignant neoplasm of upper-outer quadrant of right female breast: Secondary | ICD-10-CM | POA: Diagnosis not present

## 2020-07-26 MED ORDER — MELOXICAM 15 MG PO TABS
15.0000 mg | ORAL_TABLET | Freq: Every day | ORAL | 3 refills | Status: DC
Start: 2020-07-26 — End: 2021-07-15

## 2020-07-26 NOTE — Assessment & Plan Note (Signed)
Stable dyspnea with minimal exertion (speaking in paragraphs). She is interested in pulmonary rehab - was referred by [facility] and attended first appt but was unable to tolerate the activities while wearing a facemask (Covid).  She would certainly benefit - I've placed a call to determine if there are exceptions to the masking rule for those with vaccinations.

## 2020-07-26 NOTE — Progress Notes (Signed)
Linda [name] is establishing care in our [facility], formerly receiving care for many years with PCP in [place] where she lived.  She has relocated to [facility] [place] [facility] upon retirement Dealer, business, a Scientist, forensic in earlier life).  Acute visit 05/2020 for vertigo here at [facility]: dx BPPV; referred to outpatient vestibular rehab.  Symptoms are much less frequent and do not persist.  She has home exercises which are beneficial.  The problem is more annoying than concerning for her at this time. She has no symptoms today.  ROS: Dyspneic with minimal exertion due to COPD, baseline.  No chest discomfort or significant cough. R ankle pain is worsening and she intends to schedule a steroid injection soon.  Covid time period has led to deconditioning, poor sleep, decrease in physical activity which she misses, and weight gain.  She also c/o infrequent episodes over the years of nausea sometimes accompanied by vomiting, in setting of either constipation or of diarrhea.  She feels that symptoms are less frequent since beginning a probiotic.    Functionally she is very independent with IADLs and with ADLs and with no concerning declines over time.  She has not had blood work completed in over a year.  Health maintenance: Colonoscopy nml > 10 yrs ago; stool FIT test negative 1 year ago.  She would prefer this to endoscopy. Mammogram (cancer history) this fall. "I've had all my Covid shots" DEXA (known osteoporosis) scheduled 10/2020 TDap UTD Pneumococcal vaccines UTD  Patient Active Problem List   Diagnosis Date Noted  . BPPV (benign paroxysmal positional vertigo) 06/05/2020  . Hypertension 06/05/2020  . COVID-19 03/28/2020  . Former smoker 05/28/2018  . Degenerative joint disease of ankle and foot 12/15/2017  . Breast cancer metastasized to axillary lymph node, right ([place]) 08/14/2016  . Carcinoma of upper-outer quadrant of right breast in female, estrogen receptor positive ([place]) 07/25/2016  . Osteoporosis  05/27/2016  . Post-surgical hypothyroidism 05/09/2016  . Hx of closed left femoral fracture ([place]) 05/2016 due to fall from upright position groundlevel 05/08/2016  . COPD, group D, by GOLD 2017 classification ([place]) 01/28/2011     Current Outpatient Medications:  .  amLODipine (NORVASC) 10 MG tablet, Take 1 tablet (10 mg total) by mouth daily., Disp: 30 tablet, Rfl: 2 .  azithromycin (ZITHROMAX) 250 MG tablet, Take 1 tablet by mouth once daily, Disp: 30 tablet, Rfl: 11 .  Cholecalciferol (VITAMIN D3) 2000 units capsule, Take 2,000 Units by mouth every 14 (fourteen) days. Once every 2-3 weeks (when she remembers), Disp: , Rfl:  .  levothyroxine (SYNTHROID) 88 MCG tablet, Take 1 tablet (88 mcg total) by mouth daily., Disp: 30 tablet, Rfl: 2 .  losartan (COZAAR) 100 MG tablet, Take 1 tablet (100 mg total) by mouth daily., Disp: 90 tablet, Rfl: 2 .  meloxicam (MOBIC) 15 MG tablet, Take 1 tablet (15 mg total) by mouth daily before breakfast., Disp: 90 tablet, Rfl: 3 .  nicotine (NICOTROL) 10 MG inhaler, Nicotrol 10 mg inhalation cartridge  USE 1 CARTRIDGE 16 TIMES A DAY AS NEEDED, Disp: , Rfl:  .  omeprazole (PRILOSEC) 20 MG capsule, Take 20 mg by mouth daily before breakfast. , Disp: , Rfl:  .  Omeprazole-Sodium Bicarbonate (ZEGERID OTC) 20-1100 MG CAPS capsule, Take 1 capsule by mouth daily before breakfast., Disp: , Rfl:  .  pravastatin (PRAVACHOL) 40 MG tablet, Take 1 tablet (40 mg total) by mouth daily before breakfast., Disp: 30 tablet, Rfl: 2 .  PROAIR HFA 108 (90 Base) MCG/ACT inhaler,  INHALE 1 TO 2 PUFFS BY MOUTH EVERY 6 HOURS AS NEEDED FOR WHEEZING FOR SHORTNESS OF BREATH, Disp: 18 g, Rfl: 11 .  Probiotic Product (ULTRAFLORA IMMUNE HEALTH PO), Take by mouth., Disp: , Rfl:  .  TRELEGY ELLIPTA 100-62.5-25 MCG/INH AEPB, INHALE 1 PUFF ONCE DAILY, Disp: 60 each, Rfl: 3  Objective BP (!) 165/77 (BP Location: Right Arm, Patient Position: Sitting, Cuff Size: Small)   Pulse 85   Temp (!) 97.5 F  (36.4 C) (Oral)   Ht 5' 4.5" (1.638 m)   Wt 133 lb (60.3 kg)   SpO2 96%   BMI 22.48 kg/m   Slender, nicely dressed and groomed woman with youthful appearance. Alert, normal affect in conversation, clear diction. Talking in paragraphs leads to dyspnea which she manages with controlled breathing.  Intolerant of face mask which she feels is rather suffocating.  Heart RRR no murmur. Lungs with good airflow to bases, no wheeze, faint coarse rales in posterior bases.  No LE edema.  Supportive R ankle brace in place.  Walks with stable gait, mild antalgia favoring R.    Assessment and Plan (see also problem based charting)  Linda [name] is a 75 yo with class D COPD (without hypoxia), osteoporosis, HTN, post surgical hypothyroidism, and recent BPPV who is establishing care in our [facility].  She has signed a release for medical records as of 05/2020 so that we might update EMR with info from her [place].  Labs BMP, TSH, vit D, CBC will be updated today.  Return in [place] for recheck and to discuss osteoporosis treatment.

## 2020-07-26 NOTE — Assessment & Plan Note (Signed)
Bisphosphonate has never been offered, she doesn't know her vit D level and takes supp inconsistently.  Vit D level today, DEXA scheduled for 10/2020.  Family history.

## 2020-07-26 NOTE — Assessment & Plan Note (Signed)
TSH today, adjust replacement as needed.

## 2020-07-26 NOTE — Patient Instructions (Signed)
Wonderful to spend time getting to know you today, Ms. Keziah!  I look forward to working with you.  We are checking a number of tests today, I'll call you with the results.  Please schedule a visit when you return from your vacation this summer, so that we can continue our conversation and discuss osteoporosis treatment in particular.  Take care, stay well, and have a wonderful trip with your family!  Dr. Jimmye Norman

## 2020-07-26 NOTE — Assessment & Plan Note (Signed)
Bothersome. Wears ankle wrap/brace. Intends to schedule steroid injection at sports med clinic soon so that she can slowly resume her former active lifestyle (as permitted by breathing status).

## 2020-07-26 NOTE — Assessment & Plan Note (Signed)
Episodic, short duration of events, does home exercises, much improved. Monitor.  She doesn't feel that she needs to return to PT.

## 2020-07-26 NOTE — Assessment & Plan Note (Signed)
Amlodipine 10 mg daily; not at goal, no change in medication until we discuss this further at f/u 09/2020.

## 2020-07-27 ENCOUNTER — Other Ambulatory Visit: Payer: Self-pay | Admitting: Pulmonary Disease

## 2020-07-27 LAB — CBC
Hematocrit: 46.8 % — ABNORMAL HIGH (ref 34.0–46.6)
Hemoglobin: 15.6 g/dL (ref 11.1–15.9)
MCH: 29.7 pg (ref 26.6–33.0)
MCHC: 33.3 g/dL (ref 31.5–35.7)
MCV: 89 fL (ref 79–97)
Platelets: 305 10*3/uL (ref 150–450)
RBC: 5.26 x10E6/uL (ref 3.77–5.28)
RDW: 12.4 % (ref 11.7–15.4)
WBC: 7.6 10*3/uL (ref 3.4–10.8)

## 2020-07-27 LAB — LIPID PANEL
Chol/HDL Ratio: 4.5 ratio — ABNORMAL HIGH (ref 0.0–4.4)
Cholesterol, Total: 174 mg/dL (ref 100–199)
HDL: 39 mg/dL — ABNORMAL LOW (ref >39)
LDL Chol Calc (NIH): 92 mg/dL (ref 0–99)
Triglycerides: 256 mg/dL — ABNORMAL HIGH (ref 0–149)
VLDL Cholesterol Cal: 43 mg/dL — ABNORMAL HIGH (ref 5–40)

## 2020-07-27 LAB — VITAMIN D 25 HYDROXY (VIT D DEFICIENCY, FRACTURES): Vit D, 25-Hydroxy: 8.2 ng/mL — ABNORMAL LOW (ref 30.0–100.0)

## 2020-07-27 LAB — BMP8+ANION GAP
Anion Gap: 20 mmol/L — ABNORMAL HIGH (ref 10.0–18.0)
BUN/Creatinine Ratio: 17 (ref 12–28)
BUN: 18 mg/dL (ref 8–27)
CO2: 19 mmol/L — ABNORMAL LOW (ref 20–29)
Calcium: 9.7 mg/dL (ref 8.7–10.3)
Chloride: 100 mmol/L (ref 96–106)
Creatinine, Ser: 1.07 mg/dL — ABNORMAL HIGH (ref 0.57–1.00)
Glucose: 103 mg/dL — ABNORMAL HIGH (ref 65–99)
Potassium: 4.2 mmol/L (ref 3.5–5.2)
Sodium: 139 mmol/L (ref 134–144)
eGFR: 54 mL/min/{1.73_m2} — ABNORMAL LOW (ref >59)

## 2020-07-27 LAB — TSH: TSH: 1.07 u[IU]/mL (ref 0.450–4.500)

## 2020-07-30 ENCOUNTER — Other Ambulatory Visit: Payer: Self-pay | Admitting: Hematology and Oncology

## 2020-07-30 DIAGNOSIS — Z1231 Encounter for screening mammogram for malignant neoplasm of breast: Secondary | ICD-10-CM

## 2020-08-13 ENCOUNTER — Other Ambulatory Visit: Payer: Self-pay | Admitting: *Deleted

## 2020-08-13 MED ORDER — NICOTINE 10 MG IN INHA: RESPIRATORY_TRACT | 3 refills | Status: DC

## 2020-08-13 NOTE — Telephone Encounter (Signed)
Refll Request  nicotine (NICOTROL) 10 MG inhaler   [facility] [address] [place] [address] (Ph: [phone])

## 2020-08-23 DIAGNOSIS — L820 Inflamed seborrheic keratosis: Secondary | ICD-10-CM | POA: Diagnosis not present

## 2020-08-23 DIAGNOSIS — D485 Neoplasm of uncertain behavior of skin: Secondary | ICD-10-CM | POA: Diagnosis not present

## 2020-08-23 DIAGNOSIS — D225 Melanocytic nevi of trunk: Secondary | ICD-10-CM | POA: Diagnosis not present

## 2020-08-23 DIAGNOSIS — L728 Other follicular cysts of the skin and subcutaneous tissue: Secondary | ICD-10-CM | POA: Diagnosis not present

## 2020-08-23 DIAGNOSIS — D235 Other benign neoplasm of skin of trunk: Secondary | ICD-10-CM | POA: Diagnosis not present

## 2020-08-23 DIAGNOSIS — L814 Other melanin hyperpigmentation: Secondary | ICD-10-CM | POA: Diagnosis not present

## 2020-08-23 DIAGNOSIS — L905 Scar conditions and fibrosis of skin: Secondary | ICD-10-CM | POA: Diagnosis not present

## 2020-08-23 DIAGNOSIS — Z85828 Personal history of other malignant neoplasm of skin: Secondary | ICD-10-CM | POA: Diagnosis not present

## 2020-08-23 DIAGNOSIS — L308 Other specified dermatitis: Secondary | ICD-10-CM | POA: Diagnosis not present

## 2020-08-23 DIAGNOSIS — L853 Xerosis cutis: Secondary | ICD-10-CM | POA: Diagnosis not present

## 2020-08-31 ENCOUNTER — Other Ambulatory Visit: Payer: Self-pay | Admitting: Pulmonary Disease

## 2020-09-07 ENCOUNTER — Other Ambulatory Visit: Payer: Self-pay | Admitting: Hematology and Oncology

## 2020-09-07 DIAGNOSIS — Z853 Personal history of malignant neoplasm of breast: Secondary | ICD-10-CM

## 2020-09-12 ENCOUNTER — Ambulatory Visit
Admission: RE | Admit: 2020-09-12 | Discharge: 2020-09-12 | Disposition: A | Discharge status: Home / Self Care | Payer: PPO | Source: Ambulatory Visit | Attending: Hematology and Oncology | Admitting: Hematology and Oncology

## 2020-09-12 ENCOUNTER — Other Ambulatory Visit: Payer: Self-pay | Admitting: Hematology and Oncology

## 2020-09-12 ENCOUNTER — Other Ambulatory Visit: Payer: Self-pay

## 2020-09-12 DIAGNOSIS — Z1231 Encounter for screening mammogram for malignant neoplasm of breast: Secondary | ICD-10-CM

## 2020-09-12 DIAGNOSIS — Z853 Personal history of malignant neoplasm of breast: Secondary | ICD-10-CM

## 2020-09-18 DIAGNOSIS — M19071 Primary osteoarthritis, right ankle and foot: Secondary | ICD-10-CM | POA: Diagnosis not present

## 2020-10-02 ENCOUNTER — Encounter: Payer: Self-pay | Admitting: *Deleted

## 2020-10-09 ENCOUNTER — Other Ambulatory Visit: Payer: Self-pay

## 2020-10-09 ENCOUNTER — Other Ambulatory Visit: Payer: Self-pay | Admitting: Internal Medicine

## 2020-10-09 DIAGNOSIS — M810 Age-related osteoporosis without current pathological fracture: Secondary | ICD-10-CM

## 2020-10-09 MED ORDER — PRAVASTATIN SODIUM 40 MG PO TABS: 40.0000 mg | ORAL_TABLET | Freq: Every day | ORAL | 3 refills | Status: DC

## 2020-10-09 MED ORDER — VITAMIN D3 1.25 MG (50000 UT) PO TABS: 50000.0000 [IU] | ORAL_TABLET | ORAL | 0 refills | Status: DC

## 2020-10-09 NOTE — Progress Notes (Signed)
Left vm for Linda [name] with update that I have prescribed a higher dose vit D to be taken weekly for 8 weeks.

## 2020-10-17 ENCOUNTER — Encounter: Payer: Self-pay | Admitting: Student

## 2020-10-17 ENCOUNTER — Ambulatory Visit (INDEPENDENT_AMBULATORY_CARE_PROVIDER_SITE_OTHER): Payer: PPO | Admitting: Student

## 2020-10-17 VITALS — Ht 64.0 in | Wt 130.0 lb

## 2020-10-17 DIAGNOSIS — Z0000 Encounter for general adult medical examination without abnormal findings: Secondary | ICD-10-CM | POA: Diagnosis not present

## 2020-10-17 NOTE — Progress Notes (Signed)
This AWV is being conducted by [place] only. The patient was located at home and I was located in [facility]. The patient's identity was confirmed using their DOB and current address. The patient or his/her legal guardian has consented to being evaluated through a telephone encounter and understands the associated risks (an examination cannot be done and the patient may need to come in for an appointment) / benefits (allows the patient to remain at home, decreasing exposure to coronavirus). I personally spent 39 minutes conducting the AWV.  Subjective:   Linda [name] is a 75 y.o. female who presents for a Medicare Annual Wellness Visit.  The following items have been reviewed and updated today in the appropriate area in the EMR.   Health Risk Assessment  Height, weight, BMI, and BP Visual acuity if needed Depression screen Fall risk / safety level Advance directive discussion Medical and family history were reviewed and updated Updating list of other providers & suppliers Medication reconciliation, including over the counter medicines Cognitive screen Written screening schedule Risk Factor list Personalized health advice, risky behaviors, and treatment advice  Social History   Social History Narrative   Current Social History 10/17/2020        Patient lives alone in a home which is 1 story. There are not steps up to the entrance the patient uses.       Patient's method of transportation is personal car.      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      The patient currently retired.      Identified important Relationships are Daughters       Pets : None, pet recently passed away       Interests / Fun: travel, recently traveled to [place]       Current Stressors: none       Religious / Personal Beliefs: "I believe in [place]"       Other: N/A              Objective:    Vitals: Ht 5\' 4"  (1.626 m) Comment: patient stated  Wt 130  lb (59 kg) Comment: patient stated  BMI 22.31 kg/m  Vitals are unable to obtained due to [id number] public health emergency  Activities of Daily Living In your present state of health, do you have any difficulty performing the following activities: 10/17/2020 07/26/2020  Hearing? Y Y  Comment States she had her hearing checked and she is not hearing the "R" sound -  Vision? N N  Difficulty concentrating or making decisions? N N  Walking or climbing stairs? Y Y  Comment ankle problems -  Dressing or bathing? N N  Doing errands, shopping? N N  Some recent data might be hidden    Goals  Goals      Patient Stated     To continue living a healthy lifestyle        Fall Risk Fall Risk  10/17/2020 07/26/2020 06/05/2020 12/26/2016 09/03/2016  Falls in the past year? 0 0 0 No No  Number falls in past yr: - 0 - - -  Injury with Fall? - 0 - - -  Comment - - - - -  Risk for fall due to : - - No Fall Risks - -  Follow up Falls evaluation completed - - - -    Depression Screen PHQ 2/9 Scores 10/17/2020 07/26/2020 06/05/2020 12/26/2016  PHQ - 2 Score 0 0 0 0  PHQ- 9 Score 0 - 0 -     Cognitive Testing Six-Item Cognitive Screener   "I would like to ask you some questions that ask you to use your memory. I am going to name three objects. Please wait until I say all three words, then repeat them. Remember what they are  because I am going to ask you to name them again in a few minutes. Please repeat these words for me: APPLE--TABLE--PENNY." (Interviewer may repeat names 3 times if necessary but repetition not scored.)  Did patient correctly repeat all three words? Yes - may proceed with screen  What year is this? Incorrect What month is this? Correct What day of the week is this? Correct  What were the three objects I asked you to remember? Apple Correct Table Correct Penny Correct  Score one point for each incorrect answer.  A score of 2 or more points warrants additional investigation.   Patient's score 1  PHQ9 =0     Assessment and Plan:    During the course of the visit the patient was educated and counseled about appropriate screening and preventive services as documented in the assessment and plan.  The patient has a DEXA scan scheduled for 11/01/20. The patient completed a mammogram on 09/14/20. The patient has an upcoming appointment with her PCP on 11/15/20.  The patient reports exercising at her gym several times a week and states her goal is to maintain an active lifestyle.   The printed AVS was given to the patient and included an updated screening schedule, a list of risk factors, and personalized health advice.        Higinio Roger, RN  10/17/2020

## 2020-10-17 NOTE — Patient Instructions (Addendum)
Author: Angelica Pou, MD Author Type: Physician Filed: 07/20/2020  4:11 PM  Note Status: Signed Cosign: Cosign Not Required Encounter Date: 07/06/2020  Editor: Angelica Pou, MD (Physician)              Things That May Be Affecting Your Health:   Alcohol   Hearing loss   Pain    Depression   Home Safety   Sexual Health    Diabetes   Lack of physical activity   Stress    Difficulty with daily activities   Loneliness   Tiredness    Drug use   Medicines   Tobacco use    Falls   Motor Vehicle Safety   Weight    Food choices   Oral Health   Other      YOUR PERSONALIZED HEALTH PLAN : 1. Schedule your next subsequent Medicare Wellness visit in one year 2. Attend all of your regular appointments to address your medical issues 3. Complete the preventative screenings and services 4.  Please keep bone density scan appointment on 11/01/20. 5.  Keep up the great work!     Annual Wellness Visit                       Medicare Covered Preventative Screenings and Services   Services & Screenings Men and Women Who How Often Need? Date of Last Service Action  Abdominal Aortic Aneurysm Adults with AAA risk factors Once        Alcohol Misuse and Counseling All Adults Screening once a year if no alcohol misuse. Counseling up to 4 face to face sessions.        Bone Density Measurement Adults at risk for osteoporosis Once every 2 yrs X think so   May be ordered  Lipid Panel Z13.6 All adults without CV disease Once every 5 yrs x          C   olorectal Cancer Stool sample or Colonoscopy All adults 2 and older   Once every year Every 10 years ??            Depression All Adults Once a year   Today    Diabetes Screening Blood glucose, post glucose load, or GTT Z13.1 All adults at risk Pre-diabetics Once per year Twice per year          Diabetes  Self-Management Training All adults Diabetics 10 hrs first year; 2 hours subsequent years. Requires Copay        Glaucoma  Diabetics Family history of glaucoma African Americans 109 yrs + Hispanic Americans 62 yrs + Annually - requires coppay unknown          Hepatitis C Z72.89 or F19.20 High Risk for HCV Born between 1945 and 1965 Annually Once          HIV Z11.4 All adults based on risk Annually btw ages 12 & 51 regardless of risk Annually > 65 yrs if at increased risk          Lung Cancer Screening Asymptomatic adults aged 88-76 with 30 pack yr history and current smoker OR quit within the last 15 yrs Annually Must have counseling and shared decision making documentation before first screen          Medical Nutrition Therapy Adults with Diabetes Renal disease Kidney transplant within past 3 yrs 3 hours first year; 2 hours subsequent years        Obesity and Counseling All adults Screening  once a year Counseling if BMI 30 or higher   Today    Tobacco Use Counseling Adults who use tobacco Up to 8 visits in one year X?      Vaccines Z23 Hepatitis B Influenza Pneumonia Adults   Once Once every flu season Two different vaccines separated by one year        Next Annual Wellness Visit People with Medicare Every year   Today        [place] Women Who How Often Need Date of Last Service Action  Mammogram  Z12.31 Women over 41 One baseline ages 46-39. Annually ager 40 yrs+ x   09/12/19    Pap tests All women Annually if high risk. Every 2 yrs for normal risk women ?        Screening for cervical cancer with Pap (Z01.419 nl or Z01.411abnl) & HPV Z11.51 Women aged 76 to 34 Once every 5 yrs        Screening pelvic and breast exams All women Annually if high risk. Every 2 yrs for normal risk women        Sexually Transmitted Diseases Chlamydia Gonorrhea Syphilis All at risk adults Annually for non pregnant females at increased risk                [place] Men Who How Ofter Need Date of Last Service Action  Prostate Cancer - DRE & PSA Men over 50 Annually. DRE might  require a copay.              Sexually Transmitted Diseases Syphilis All at risk adults Annually for men at increased risk          Health Maintenance List     Health Maintenance  Topic Date Due   Hepatitis C Screening Never done   COLONOSCOPY (Pts 45-49yrs Insurance coverage will need to be confirmed) Never done   COVID-19 Vaccine (3 - Pfizer risk 4-dose series) 06/12/2019   INFLUENZA VACCINE 10/29/2020   TETANUS/TDAP 12/25/2020   DEXA SCAN Completed   PNA vac Low Risk Adult Completed   HPV VACCINES Aged Out            Progress Notes by Solomon, [place] Maintenance, Female Adopting a healthy lifestyle and getting preventive care are important in promoting health and wellness. Ask your health care provider about: The right schedule for you to have regular tests and exams. Things you can do on your own to prevent diseases and keep yourself healthy. What should I know about diet, weight, and exercise? Eat a healthy diet  Eat a diet that includes plenty of vegetables, fruits, low-fat dairy products, and lean protein. Do not eat a lot of foods that are high in solid fats, added sugars, or sodium.  Maintain a healthy weight Body mass index (BMI) is used to identify weight problems. It estimates body fat based on height and weight. Your health care provider can help determineyour BMI and help you achieve or maintain a healthy weight. Get regular exercise Get regular exercise. This is one of the most important things you can do for your health. Most adults should: Exercise for at least 150 minutes each week. The exercise should increase your heart rate and make you sweat (moderate-intensity exercise). Do strengthening exercises at least twice a week. This is in addition to the moderate-intensity exercise. Spend less time sitting. Even light physical activity can be beneficial. Watch cholesterol and blood lipids Have your blood  tested for lipids and cholesterol at 76  years of age, then havethis test every 5 years. Have your cholesterol levels checked more often if: Your lipid or cholesterol levels are high. You are older than 76 years of age. You are at high risk for heart disease. What should I know about cancer screening? Depending on your health history and family history, you may need to have cancer screening at various ages. This may include screening for: Breast cancer. Cervical cancer. Colorectal cancer. Skin cancer. Lung cancer. What should I know about heart disease, diabetes, and high blood pressure? Blood pressure and heart disease High blood pressure causes heart disease and increases the risk of stroke. This is more likely to develop in people who have high blood pressure readings, are of African descent, or are overweight. Have your blood pressure checked: Every 3-5 years if you are 58-59 years of age. Every year if you are 31 years old or older. Diabetes Have regular diabetes screenings. This checks your fasting blood sugar level. Have the screening done: Once every three years after age 73 if you are at a normal weight and have a low risk for diabetes. More often and at a younger age if you are overweight or have a high risk for diabetes. What should I know about preventing infection? Hepatitis B If you have a higher risk for hepatitis B, you should be screened for this virus. Talk with your health care provider to find out if you are at risk forhepatitis B infection. Hepatitis C Testing is recommended for: Everyone born from [birth year] through 1965. Anyone with known risk factors for hepatitis C. Sexually transmitted infections (STIs) Get screened for STIs, including gonorrhea and chlamydia, if: You are sexually active and are younger than 76 years of age. You are older than 76 years of age and your health care provider tells you that you are at risk for this type of infection. Your sexual activity has changed since you were last  screened, and you are at increased risk for chlamydia or gonorrhea. Ask your health care provider if you are at risk. Ask your health care provider about whether you are at high risk for HIV. Your health care provider may recommend a prescription medicine to help prevent HIV infection. If you choose to take medicine to prevent HIV, you should first get tested for HIV. You should then be tested every 3 months for as long as you are taking the medicine. Pregnancy If you are about to stop having your period (premenopausal) and you may become pregnant, seek counseling before you get pregnant. Take 400 to 800 micrograms (mcg) of folic acid every day if you become pregnant. Ask for birth control (contraception) if you want to prevent pregnancy. Osteoporosis and menopause Osteoporosis is a disease in which the bones lose minerals and strength with aging. This can result in bone fractures. If you are 10 years old or older, or if you are at risk for osteoporosis and fractures, ask your health care provider if you should: Be screened for bone loss. Take a calcium or vitamin D supplement to lower your risk of fractures. Be given hormone replacement therapy (HRT) to treat symptoms of menopause. Follow these instructions at home: Lifestyle Do not use any products that contain nicotine or tobacco, such as cigarettes, e-cigarettes, and chewing tobacco. If you need help quitting, ask your health care provider. Do not use street drugs. Do not share needles. Ask your health care provider for help if you need  support or information about quitting drugs. Alcohol use Do not drink alcohol if: Your health care provider tells you not to drink. You are pregnant, may be pregnant, or are planning to become pregnant. If you drink alcohol: Limit how much you use to 0-1 drink a day. Limit intake if you are breastfeeding. Be aware of how much alcohol is in your drink. In the [place] one drink equals one 12 oz bottle of beer  (355 mL), one 5 oz glass of wine (148 mL), or one 1 oz glass of hard liquor (44 mL). General instructions Schedule regular health, dental, and eye exams. Stay current with your vaccines. Tell your health care provider if: You often feel depressed. You have ever been abused or do not feel safe at home. Summary Adopting a healthy lifestyle and getting preventive care are important in promoting health and wellness. Follow your health care provider's instructions about healthy diet, exercising, and getting tested or screened for diseases. Follow your health care provider's instructions on monitoring your cholesterol and blood pressure. This information is not intended to replace advice given to you by your health care provider. Make sure you discuss any questions you have with your healthcare provider. Document Revised: 03/10/2018 Document Reviewed: 03/10/2018 Elsevier Patient Education  2022 [employer].

## 2020-10-17 NOTE — Progress Notes (Signed)
I discussed the AWV findings with the RN who conducted the visit. I was present in the office suite and immediately available to provide assistance and direction throughout the time the service was provided.   

## 2020-10-25 ENCOUNTER — Encounter: Payer: PPO | Admitting: Internal Medicine

## 2020-10-30 ENCOUNTER — Other Ambulatory Visit: Payer: Self-pay

## 2020-10-30 MED ORDER — LEVOTHYROXINE SODIUM 88 MCG PO TABS: 88.0000 ug | ORAL_TABLET | Freq: Every day | ORAL | 2 refills | Status: DC

## 2020-10-30 MED ORDER — NICOTINE 10 MG IN INHA: RESPIRATORY_TRACT | 3 refills | Status: AC

## 2020-11-01 ENCOUNTER — Other Ambulatory Visit: Payer: Self-pay

## 2020-11-01 ENCOUNTER — Ambulatory Visit
Admission: RE | Admit: 2020-11-01 | Discharge: 2020-11-01 | Disposition: A | Discharge status: Home / Self Care | Payer: Medicare HMO | Source: Ambulatory Visit | Attending: Internal Medicine | Admitting: Internal Medicine

## 2020-11-01 DIAGNOSIS — M810 Age-related osteoporosis without current pathological fracture: Secondary | ICD-10-CM | POA: Diagnosis not present

## 2020-11-01 DIAGNOSIS — R109 Unspecified abdominal pain: Secondary | ICD-10-CM | POA: Insufficient documentation

## 2020-11-01 DIAGNOSIS — Z780 Asymptomatic menopausal state: Secondary | ICD-10-CM | POA: Diagnosis not present

## 2020-11-14 NOTE — Progress Notes (Signed)
AWV documentation reviewed and approved.

## 2020-11-15 ENCOUNTER — Ambulatory Visit (INDEPENDENT_AMBULATORY_CARE_PROVIDER_SITE_OTHER): Payer: PPO | Admitting: Internal Medicine

## 2020-11-15 VITALS — BP 156/87 | HR 80 | Temp 97.5°F | Ht 64.0 in | Wt 132.3 lb

## 2020-11-15 DIAGNOSIS — E890 Postprocedural hypothyroidism: Secondary | ICD-10-CM

## 2020-11-15 DIAGNOSIS — R1011 Right upper quadrant pain: Secondary | ICD-10-CM

## 2020-11-15 DIAGNOSIS — M810 Age-related osteoporosis without current pathological fracture: Secondary | ICD-10-CM | POA: Diagnosis not present

## 2020-11-15 DIAGNOSIS — I10 Essential (primary) hypertension: Secondary | ICD-10-CM | POA: Diagnosis not present

## 2020-11-15 DIAGNOSIS — C50411 Malignant neoplasm of upper-outer quadrant of right female breast: Secondary | ICD-10-CM | POA: Diagnosis not present

## 2020-11-15 DIAGNOSIS — J449 Chronic obstructive pulmonary disease, unspecified: Secondary | ICD-10-CM | POA: Diagnosis not present

## 2020-11-15 DIAGNOSIS — R109 Unspecified abdominal pain: Secondary | ICD-10-CM

## 2020-11-15 DIAGNOSIS — Z170 Estrogen receptor positive status [ER+]: Secondary | ICD-10-CM

## 2020-11-15 LAB — POCT URINALYSIS DIPSTICK
Bilirubin, UA: NEGATIVE
Blood, UA: NEGATIVE
Glucose, UA: NEGATIVE
Ketones, UA: NEGATIVE
Leukocytes, UA: NEGATIVE
Nitrite, UA: NEGATIVE
Protein, UA: NEGATIVE
Spec Grav, UA: 1.015 (ref 1.010–1.025)
Urobilinogen, UA: 0.2 E.U./dL
pH, UA: 8.5 — AB (ref 5.0–8.0)

## 2020-11-15 MED ORDER — AMLODIPINE BESYLATE 10 MG PO TABS: 10.0000 mg | ORAL_TABLET | Freq: Every day | ORAL | 3 refills | Status: DC

## 2020-11-15 MED ORDER — LEVOTHYROXINE SODIUM 88 MCG PO TABS: 88.0000 ug | ORAL_TABLET | Freq: Every day | ORAL | 3 refills | Status: DC

## 2020-11-15 NOTE — Patient Instructions (Addendum)
Linda [name], We discussed several things today, including the need for 90 day supply of medications (done), problems with chronic pain which has been frustrating and functionally limiting (we discussed PT referral including possibility of Integrative Therapies), discussed DEXA results confirming osteoporosis (checking vit D level today to determine next dose of supplementation) and which deserve consideration of treatment with medicines called bisphosphonates. The bisphosphonates can have side effects, particularly stomach and esophagus.  Let's think on this further before making a decision.    You would like to postpone a referral to physical therapist at this time.  We'll continue discussions over time.    More urgent though is your two weeks of R sided pain.  A number of possibilities should be considered.  For  now we agreed to give this more time to improve.  If it doesn't, you will let me know and we will proceed with a CT scan to look at your liver, gallbladder, and your kidney system.    I certainly hope you feel better and will let you know next week by phone when I have your tests results.  Dr. Jimmye Norman

## 2020-11-15 NOTE — Progress Notes (Signed)
Routine f/u for [name], 75 yo with severe COPD among other conditions noted in problem list.    She reports two weeks of R sided pain, preceeded by some dysuria.  Increased urinary frequency, sense of improvement with emtpying bladder.   No blood in urine or stool, bowel movements normal.  Non radiating pain.  On one occasion the pain was so bad she experienced vomiting.  Came on suddenly, after shopping, had put away groceries, sat down and the pain hit.  Pain improved after about an hour but has continued at a lower intensity.    She has been able to eat without nausea or vomiting and has had normal bowel movements.    Pain onset was not after a meal.  Ankle cortisone shots Q 60m most recent early summer, ankle replacement advised, doesn't find this a very palatable idea.   Physical Exam: BP (!) 156/87 (BP Location: Right Arm, Patient Position: Sitting, Cuff Size: Small)   Pulse 80   Temp (!) 97.5 F (36.4 C) (Oral)   Ht '5\' 4"'$  (1.626 m)   Wt 132 lb 4.8 oz (60 kg)   SpO2 98%   BMI 22.71 kg/m   Appears anxious but not uncomfortable, nicely dressed and groomed, speaks in paragraphs though utilizes frequent pursed lip breathing which is nml for her.  Pulses radial strong and regular.  L paraspinal muscled are more prominent - no tenderness.  Mild R CVAT.  Tender in RUQ w/o guard.  Tympanic in RUQ, nml distribution of percussion tones throughout soft nondistended abd.  Ribs themselves do not feel tender.  R ankle is in a brace/support.  Assessment and plan (see also problem based documentation)  Refer to PT for chronic pain management and COPD?  She declines at this time.  She would be an excellent candidate for cardiopulmonary rehab but would not be able to wear the required mask (Covid prevention).    Linda [name] expressed misgivings about her chronic health decline - suspecting cascade of unintended problems including precribing cascades.  Regrets having thyroidectomy and wonders  whether she ever had Graves disease as she was told. Space provided to opine.  SHe is mourning her more functional former self.  Chronic pain is bringing her down, though she comments that she had restorative sleep, normal appetite, and no pain during a recent family vacation.  R sided flank/abdominal pain - UA today (for evidence of Hg which may suggest nephrolithiasis).  She prefers to postpone the Ct and will [place] if symptoms persist or worsen.  Osteoporosis:   Concerned about side effects of osteoporosis treatment and knows someone who experienced severe adverse events.  Recheck Vit D level today.  No bisphosphonates at this time.  High risk of fracture discussed which she accepts.

## 2020-11-16 LAB — MICROSCOPIC EXAMINATION
Casts: NONE SEEN /lpf
Epithelial Cells (non renal): NONE SEEN /hpf (ref 0–10)
RBC, Urine: NONE SEEN /hpf (ref 0–2)
WBC, UA: NONE SEEN /hpf (ref 0–5)

## 2020-11-16 LAB — URINALYSIS, COMPLETE
Bilirubin, UA: NEGATIVE
Glucose, UA: NEGATIVE
Ketones, UA: NEGATIVE
Leukocytes,UA: NEGATIVE
Nitrite, UA: NEGATIVE
Protein,UA: NEGATIVE
RBC, UA: NEGATIVE
Specific Gravity, UA: 1.007 (ref 1.005–1.030)
Urobilinogen, Ur: 0.2 mg/dL (ref 0.2–1.0)
pH, UA: 8.5 — ABNORMAL HIGH (ref 5.0–7.5)

## 2020-11-16 LAB — VITAMIN D 25 HYDROXY (VIT D DEFICIENCY, FRACTURES): Vit D, 25-Hydroxy: 69.1 ng/mL (ref 30.0–100.0)

## 2020-11-23 ENCOUNTER — Encounter: Payer: Self-pay | Admitting: Internal Medicine

## 2020-11-23 NOTE — Assessment & Plan Note (Signed)
Stable, continues to follow with pulmonary.  Covid masks indeed are required at pulmonary rehab - she thus declines.

## 2020-11-23 NOTE — Assessment & Plan Note (Signed)
Osteoporotic by recent DEXA.  Finishing high dose vitamin D, recheck.  She declines bisphosphonate after reading about potential adverse effects. She is aware of high fracture risk though hopes this will not be in her future.

## 2020-11-23 NOTE — Assessment & Plan Note (Signed)
Mammogram normal, repeat 1 year.

## 2020-11-23 NOTE — Assessment & Plan Note (Signed)
Uncomfortable and anxious today.  HTN not discussed.  She is not at goal, desires to minimize medications when possible.  Readdress next visit.

## 2020-12-06 ENCOUNTER — Telehealth: Payer: Self-pay

## 2020-12-06 NOTE — Telephone Encounter (Signed)
Patient completed her AWV in June 2022

## 2020-12-12 NOTE — Progress Notes (Signed)
Patient Care Team: Miguel Aschoff, MD as PCP - General (Internal Medicine) Leslye Peer, MD (Pulmonary Disease) Almond Lint, MD as Consulting Physician (Surgical Oncology) Lonie Peak, MD as Attending Physician (Radiation Oncology) Serena Croissant, MD as Consulting Physician (Hematology and Oncology) Axel Filler, Larna Daughters, NP as Nurse Practitioner (Hematology and Oncology) Delfin Gant, MD as Attending Physician (Family Medicine)  DIAGNOSIS:    ICD-10-CM   1. Carcinoma of upper-outer quadrant of right breast in female, estrogen receptor positive (HCC)  C50.411    Z17.0       SUMMARY OF ONCOLOGIC HISTORY: Oncology History  Carcinoma of upper-outer quadrant of right breast in female, estrogen receptor positive (HCC)  07/03/2016 Initial Diagnosis   Right breast biopsy 10:30 position: IDC, lymphovascular invasion present, right axillary lymph node positive, grade 3, ER 90%, PR 60%, Ki-67 12%, HER-2 negative ratio 0.41; right breast mass 9 cm from nipple and 30 position: 1.7 x 1 x 1.6 cm, 2 abnormal lymph nodes right axilla largest 1.9 cm smaller 1.2 cm; T1c N1 stage II a   08/14/2016 Surgery   Rt Lumpectomy: IDC grade 2, 1.8 cm, 2/15 LN, ER 90%, PR 60%, Ki-67 12%, HER-2 negative ratio 0.41; T1CN1 (Stage 2A)   08/18/2016 Miscellaneous   Mammaprint high risk luminal B: Patient refused chemotherapy. 5 year risk of recurrence 20%, 10 year risk of recurrence 29%; patient refused chemotherapy and radiation   09/01/2016 -  Anti-estrogen oral therapy   Anastrozole 1 mg daily switched to letrozole 06/16/2017 due to arthralgias and myalgias, switched back to anastrozole half tablet daily     CHIEF COMPLIANT: Annual follow-up of history of breast cancer  INTERVAL HISTORY: Linda [name] is a 75 y.o. with above-mentioned history of right breast cancer treated with lumpectomy and even though she was high risk based on MammaPrint, she did not receive chemotherapy or radiation. She  tried anastrozole for couple of years and switched to letrozole but finally discontinued antiestrogen therapy as well because of muscle aches and pains. She presents to the clinic today for follow-up.  Continues to have pain in the ankle for which she receives injections.  She has chronic COPD.  ALLERGIES:  is allergic to aspirin.  MEDICATIONS:  Current Outpatient Medications  Medication Sig Dispense Refill   amLODipine (NORVASC) 10 MG tablet Take 1 tablet (10 mg total) by mouth daily. 90 tablet 3   azithromycin (ZITHROMAX) 250 MG tablet Take 1 tablet by mouth once daily 30 tablet 11   levothyroxine (SYNTHROID) 88 MCG tablet Take 1 tablet (88 mcg total) by mouth daily. 90 tablet 3   losartan (COZAAR) 100 MG tablet Take 1 tablet (100 mg total) by mouth daily. 90 tablet 2   meloxicam (MOBIC) 15 MG tablet Take 1 tablet (15 mg total) by mouth daily before breakfast. 90 tablet 3   nicotine (NICOTROL) 10 MG inhaler Nicotrol 10 mg inhalation cartridge  USE 1 CARTRIDGE 16 TIMES A DAY AS NEEDED 42 each 3   omeprazole (PRILOSEC) 20 MG capsule Take 20 mg by mouth daily before breakfast.      Omeprazole-Sodium Bicarbonate (ZEGERID) 20-1100 MG CAPS capsule Take 1 capsule by mouth daily before breakfast.     pravastatin (PRAVACHOL) 40 MG tablet Take 1 tablet (40 mg total) by mouth daily before breakfast. 90 tablet 3   PROAIR HFA 108 (90 Base) MCG/ACT inhaler INHALE 1 TO 2 PUFFS BY MOUTH EVERY 6 HOURS AS NEEDED FOR WHEEZING FOR SHORTNESS OF BREATH 18 g 11  Probiotic Product ([place]) Take by mouth.     TRELEGY ELLIPTA 100-62.5-25 MCG/INH AEPB INHALE 1 PUFF ONCE DAILY 60 each 3   No current facility-administered medications for this visit.    PHYSICAL EXAMINATION: ECOG PERFORMANCE STATUS: 1 - Symptomatic but completely ambulatory  Vitals:   12/13/20 1506  BP: (!) 159/76  Pulse: 87  Resp: 18  Temp: 98.1 F (36.7 C)  SpO2: 98%   Filed Weights   12/13/20 1506  Weight: 131 lb  3.2 oz (59.5 kg)    BREAST: No palpable masses or nodules in either right or left breasts. No palpable axillary supraclavicular or infraclavicular adenopathy no breast tenderness or nipple discharge. (exam performed in the presence of a chaperone)  LABORATORY DATA:  I have reviewed the data as listed CMP Latest Ref Rng & Units 07/26/2020 08/15/2016 08/14/2016  Glucose 65 - 99 mg/dL 103(H) 97 -  BUN 8 - 27 mg/dL 18 15 -  Creatinine 0.57 - 1.00 mg/dL 1.07(H) 1.11(H) 0.76  Sodium 134 - 144 mmol/L 139 135 -  Potassium 3.5 - 5.2 mmol/L 4.2 4.3 -  Chloride 96 - 106 mmol/L 100 102 -  CO2 20 - 29 mmol/L 19(L) 27 -  Calcium 8.7 - 10.3 mg/dL 9.7 8.9 -  Total Protein 6.5 - 8.1 g/dL - - -  Total Bilirubin 0.3 - 1.2 mg/dL - - -  Alkaline Phos 38 - 126 U/L - - -  AST 15 - 41 U/L - - -  ALT 14 - 54 U/L - - -    Lab Results  Component Value Date   WBC 7.6 07/26/2020   HGB 15.6 07/26/2020   HCT 46.8 (H) 07/26/2020   MCV 89 07/26/2020   PLT 305 07/26/2020   NEUTROABS 3.7 10/05/2018    ASSESSMENT & PLAN:  Carcinoma of upper-outer quadrant of right breast in female, estrogen receptor positive ([place]) Rt Lumpectomy 08/14/16: IDC grade 2, 1.8 cm, 2/15 LN, ER 90%, PR 60%, Ki-67 12%, HER-2 negative ratio 0.41; T1CN1 (Stage 2A)   Mammaprint: High Risk: Risk of recurrence 20% at 5 years and 29% of 10 years. Chemotherapy would most likely decrease the risk of recurrence at 5 years to 5.4%. (Patient refused chemotherapy and radiation)   Current treatment: Anastrozole 1 mg by mouth daily restarted 09/01/2016, reduced dosage to half tablet daily discontinued June 2020 because of severe bone and joint pains Patient has chronic osteoarthritis and appears to need an ankle surgery because of a lipoma growing into the joint.   Breast cancer surveillance:  Annual mammogram 09/12/2019 : No evidence of malignancy Breast exam 12/15/2019: Benign Bone density 09/15/2016: T score -2.3: Osteopenia I recommended that the  patient take bisphosphonate therapy along with calcium and vitamin D, Patient is not interested in taking any medications.  She will discuss with her PCP.   Breast pain: Chronic in nature.   Severe COPD: Stable   Return to clinic on an as-needed basis.    No orders of the defined types were placed in this encounter.  The patient has a good understanding of the overall plan. she agrees with it. she will call with any problems that may develop before the next visit here.  Total time spent: 20 mins including face to face time and time spent for planning, charting and coordination of care  Rulon Eisenmenger, MD, MPH 12/13/2020  I, Thana Ates, am acting as scribe for Dr. Nicholas Lose.  I have reviewed the above documentation for  accuracy and completeness, and I agree with the above.       

## 2020-12-13 ENCOUNTER — Other Ambulatory Visit: Payer: Self-pay

## 2020-12-13 ENCOUNTER — Inpatient Hospital Stay: Payer: PPO | Attending: Hematology and Oncology | Admitting: Hematology and Oncology

## 2020-12-13 DIAGNOSIS — C50411 Malignant neoplasm of upper-outer quadrant of right female breast: Secondary | ICD-10-CM | POA: Insufficient documentation

## 2020-12-13 DIAGNOSIS — Z170 Estrogen receptor positive status [ER+]: Secondary | ICD-10-CM | POA: Diagnosis not present

## 2020-12-13 DIAGNOSIS — Z79811 Long term (current) use of aromatase inhibitors: Secondary | ICD-10-CM | POA: Insufficient documentation

## 2020-12-13 DIAGNOSIS — C773 Secondary and unspecified malignant neoplasm of axilla and upper limb lymph nodes: Secondary | ICD-10-CM | POA: Diagnosis not present

## 2020-12-13 NOTE — Assessment & Plan Note (Signed)
Rt Lumpectomy 08/14/16: IDC grade 2, 1.8 cm, 2/15 LN, ER 90%, PR 60%, Ki-67 12%, HER-2 negative ratio 0.41; T1CN1 (Stage 2A)  Mammaprint: High Risk: Risk of recurrence 20% at 5 years and 29% of 10 years. Chemotherapy would most likely decrease the risk of recurrence at 5 years to 5.4%.(Patient refused chemotherapy and radiation)  Current treatment: Anastrozole 1 mg by mouth dailyrestarted 09/01/2016,reduced dosage to half tablet dailydiscontinued June 2020 because of severe bone and joint pains day today  Anastrozole toxicities: Denies any hot flashes Patient has chronic osteoarthritis and appears to need an ankle surgery because of a lipoma growing into the joint.  Breast cancer surveillance:  Annual mammogram6/14/2021: No evidence of malignancy Breast exam9/16/2021: Benign Bone density 09/15/2016: T score -2.3: Osteopenia I recommended that the patient take bisphosphonate therapy along with calcium and vitamin D, Patient is not interested in taking any medications. She will discuss with her PCP.  Breast pain: Chronic in nature.  Severe COPD: Stable  Return to clinic in1 yearfor follow-up and after that she can be followed by her primary care physician.

## 2020-12-26 ENCOUNTER — Other Ambulatory Visit: Payer: Self-pay

## 2020-12-26 ENCOUNTER — Ambulatory Visit: Payer: PPO | Admitting: Pulmonary Disease

## 2020-12-26 ENCOUNTER — Ambulatory Visit (INDEPENDENT_AMBULATORY_CARE_PROVIDER_SITE_OTHER): Payer: PPO

## 2020-12-26 ENCOUNTER — Encounter: Payer: Self-pay | Admitting: Pulmonary Disease

## 2020-12-26 VITALS — BP 140/66 | HR 76 | Ht 64.0 in | Wt 130.2 lb

## 2020-12-26 DIAGNOSIS — Z720 Tobacco use: Secondary | ICD-10-CM | POA: Diagnosis not present

## 2020-12-26 DIAGNOSIS — Z87891 Personal history of nicotine dependence: Secondary | ICD-10-CM

## 2020-12-26 DIAGNOSIS — J449 Chronic obstructive pulmonary disease, unspecified: Secondary | ICD-10-CM | POA: Diagnosis not present

## 2020-12-26 DIAGNOSIS — J439 Emphysema, unspecified: Secondary | ICD-10-CM | POA: Diagnosis not present

## 2020-12-26 DIAGNOSIS — Z23 Encounter for immunization: Secondary | ICD-10-CM | POA: Diagnosis not present

## 2020-12-26 MED ORDER — TRELEGY ELLIPTA 100-62.5-25 MCG/INH IN AEPB: INHALATION_SPRAY | RESPIRATORY_TRACT | 4 refills | Status: DC

## 2020-12-26 MED ORDER — NYSTATIN 100000 UNIT/ML MT SUSP: 5.0000 mL | Freq: Four times a day (QID) | OROMUCOSAL | 0 refills | Status: DC

## 2020-12-26 MED ORDER — AZITHROMYCIN 250 MG PO TABS: 250.0000 mg | ORAL_TABLET | Freq: Every day | ORAL | 11 refills | Status: DC

## 2020-12-26 MED ORDER — PROAIR HFA 108 (90 BASE) MCG/ACT IN AERS: INHALATION_SPRAY | RESPIRATORY_TRACT | 11 refills | Status: DC

## 2020-12-26 NOTE — Progress Notes (Signed)
Linda [name]    [id number]    Linda [name]  Primary Care Physician:Williams, Elaina Pattee, MD  Referring Physician: Angelica Pou, MD [place],   [id number]  Chief complaint:   Follow up for  COPD GOLD D  HPI: Mrs. Wearing is a 75 year-old with COPD (GOLD D, CAT score 25, multiple exacerbation over past year, FEV1 41%). Maintained on Spiriva and Symbicort. His symptoms have been stable until this year when she has several exacerbations over the winter requiring courses of antibiotics and steroids. Her primary care physician has referred her back to [place] for further management. Her chief complaint is dyspnea on exertion, loss of energy, fatigue,  chronic cough with sputum production.   She's had a diagnosis of breast cancer in Linda 2018 and underwent lumpectomy and XRT. She declined chemotherapy. Stopped smoking in January 2020.  She is admitted with recurrent exacerbations in 2020 requiring multiple courses of antibiotics and steroids Started on daily azithromycin in July 2020 and nebulizer therapy  Interim History Continues on chronic azithromycin and Trelegy States that breathing is doing well with chronic dyspnea on exertion No new issues today.  Outpatient Encounter Medications as of 12/26/2020  Medication Sig   amLODipine (NORVASC) 10 MG tablet Take 1 tablet (10 mg total) by mouth daily.   azithromycin (ZITHROMAX) 250 MG tablet Take 1 tablet by mouth once daily   Cholecalciferol (VITAMIN D3) 25 MCG (1000 UT) CAPS Take by mouth.   levothyroxine (SYNTHROID) 88 MCG tablet Take 1 tablet (88 mcg total) by mouth daily.   losartan (COZAAR) 100 MG tablet Take 1 tablet (100 mg total) by mouth daily.   meloxicam (MOBIC) 15 MG tablet Take 1 tablet (15 mg total) by mouth daily before breakfast.   nicotine (NICOTROL) 10 MG inhaler Nicotrol 10 mg inhalation cartridge  USE 1 CARTRIDGE 16 TIMES A DAY AS NEEDED   omeprazole (PRILOSEC) 20 MG capsule Take  20 mg by mouth daily before breakfast.    Omeprazole-Sodium Bicarbonate (ZEGERID) 20-1100 MG CAPS capsule Take 1 capsule by mouth daily before breakfast.   PROAIR HFA 108 (90 Base) MCG/ACT inhaler INHALE 1 TO 2 PUFFS BY MOUTH EVERY 6 HOURS AS NEEDED FOR WHEEZING FOR SHORTNESS OF BREATH   Probiotic Product ([place] PO) Take by mouth.   TRELEGY ELLIPTA 100-62.5-25 MCG/INH AEPB INHALE 1 PUFF ONCE DAILY   pravastatin (PRAVACHOL) 40 MG tablet Take 1 tablet (40 mg total) by mouth daily before breakfast.   No facility-administered encounter medications on file as of 12/26/2020.   Physical Exam: Blood pressure 124/68, pulse 78, temperature 97.8 F (36.6 C), temperature source Other (Comment), height 5' 4.5" (1.638 m), weight 132 lb 6.4 oz (60.1 kg), SpO2 97 %. Gen:      No acute distress HEENT:  EOMI, sclera anicteric Neck:     No masses; no thyromegaly Lungs:    Clear to auscultation bilaterally; normal respiratory effort CV:         Regular rate and rhythm; no murmurs Abd:      + bowel sounds; soft, non-tender; no palpable masses, no distension Ext:    No edema; adequate peripheral perfusion Skin:      Warm and dry; no rash Neuro: alert and oriented x 3 Psych: normal mood and affect  Data Reviewed: Imaging CT chest 02/26/15- mild left lower lobe atelectasis, moderate emphysematous changes.  CT chest 08/01/16- moderate emphysematous changes.  No suspicious lung nodules  or mass.  Chronic scarring in the right lower lobe. Chest x-ray 08/10/2019-emphysema, no acute cardiopulmonary abnormality. I have reviewed the images personally  PFTs  02/18/11 FVC 2.42 [117%), FEV1 1.46 (69%), F/F 46, TLC 112%, DLCO 69% Moderate obstructive defect, mild reduction in diffusion capacity.   08/23/15 FVC 2.14 [71%], FEV1 0.94 [41%], F/F 44, TLC 114%, DLCO 57% Severe obstructive defect with moderate reduction in diffusion capacity  Labs: Alpha-1 antitrypsin 10/05/18-205, PI MM  Assessment:    Severe COPD. GOLD stage D Continue trelegy inhaler Unable to afford Daliresp Continue azithromycin to 250 mg daily for anti-inflammatory effect.  She has seen an ENT doctor and [facility] last year. I recommended follow-up for audiology testing but she wants to hold off for now. Frequency of exacerbations have reduced since she was started on chronic azithromycin  She has not received screening CTs of the chest due to diagnosis of breast cancer in 2018.  She may be a candidate now since its been 4 years.  We will check with a lung cancer screening team.  Health maintenance 01/12/2014-Prevnar 04/01/2011-Pneumovax  Up-to-date with COVID-19 booster and flu vaccine  Plan/Recommendations: - Continue Trelegy - Daily azithromycin - Albuterol, duo nebs  Marshell Garfinkel MD [facility] 12/26/2020, 3:09 PM  CC: Angelica Pou, MD

## 2020-12-26 NOTE — Patient Instructions (Signed)
We will renew your inhalers, Magic mouthwash and chronic azithromycin Will give a flu shot today Return to clinic in 6 months.

## 2021-02-27 DIAGNOSIS — M19071 Primary osteoarthritis, right ankle and foot: Secondary | ICD-10-CM | POA: Diagnosis not present

## 2021-02-27 DIAGNOSIS — M79671 Pain in right foot: Secondary | ICD-10-CM | POA: Diagnosis not present

## 2021-03-07 ENCOUNTER — Encounter: Payer: Self-pay | Admitting: Internal Medicine

## 2021-03-07 ENCOUNTER — Ambulatory Visit (INDEPENDENT_AMBULATORY_CARE_PROVIDER_SITE_OTHER): Payer: PPO | Admitting: Internal Medicine

## 2021-03-07 VITALS — BP 145/74 | HR 70 | Temp 97.9°F | Ht 64.0 in | Wt 130.5 lb

## 2021-03-07 DIAGNOSIS — M810 Age-related osteoporosis without current pathological fracture: Secondary | ICD-10-CM | POA: Diagnosis not present

## 2021-03-07 DIAGNOSIS — R109 Unspecified abdominal pain: Secondary | ICD-10-CM | POA: Diagnosis not present

## 2021-03-07 DIAGNOSIS — I10 Essential (primary) hypertension: Secondary | ICD-10-CM

## 2021-03-07 DIAGNOSIS — K219 Gastro-esophageal reflux disease without esophagitis: Secondary | ICD-10-CM | POA: Diagnosis not present

## 2021-03-07 DIAGNOSIS — M19071 Primary osteoarthritis, right ankle and foot: Secondary | ICD-10-CM

## 2021-03-07 MED ORDER — ALENDRONATE SODIUM 70 MG PO TABS
70.0000 mg | ORAL_TABLET | ORAL | 11 refills | Status: DC
Start: 2021-03-07 — End: 2021-12-31
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
## 2021-03-07 NOTE — Patient Instructions (Signed)
Linda [name], I enjoyed visiting with you today as I always do.  We spent time discussing the challenges you're experiencing with your ankle which has been significantly affecting your activity level and quality of life, and also talked about our desire to decrease your risk of a serious bone fracture by treating with a medicine called Fosamax.  You'll take 1 tab every 7 days, first thing in the morning with a full glass of water on an empty stomach.  Please monitor how you feel and let me know if you don't tolerate it for any reason.   Take care and stay well, Dr. Jimmye Norman

## 2021-03-07 NOTE — Progress Notes (Addendum)
x

## 2021-03-21 ENCOUNTER — Encounter: Payer: Self-pay | Admitting: Internal Medicine

## 2021-03-21 DIAGNOSIS — K219 Gastro-esophageal reflux disease without esophagitis: Secondary | ICD-10-CM | POA: Insufficient documentation

## 2021-03-21 DIAGNOSIS — IMO0001 Reserved for inherently not codable concepts without codable children: Secondary | ICD-10-CM

## 2021-03-21 DIAGNOSIS — H9190 Unspecified hearing loss, unspecified ear: Secondary | ICD-10-CM | POA: Insufficient documentation

## 2021-03-21 HISTORY — DX: Reserved for inherently not codable concepts without codable children: IMO0001

## 2021-03-21 HISTORY — DX: Unspecified hearing loss, unspecified ear: H91.90

## 2021-03-21 MED ORDER — AMLODIPINE BESYLATE 10 MG PO TABS: 10.0000 mg | ORAL_TABLET | Freq: Every day | ORAL | 3 refills | Status: DC

## 2021-03-21 MED ORDER — LOSARTAN POTASSIUM 100 MG PO TABS: 100.0000 mg | ORAL_TABLET | Freq: Every day | ORAL | 3 refills | Status: DC

## 2021-03-21 NOTE — Assessment & Plan Note (Signed)
Injections less beneficial and fusion has been suggested as a more definitive intervention. She is not sure whether she wants to proceed.  THis has been very limiting for her.

## 2021-03-21 NOTE — Assessment & Plan Note (Signed)
aFter much discussion she agrees to a trial of Fosamax.  We discussed high risk of fractures give her degree of osteoporosis and her very slim habitus.  Continues on Vit D.

## 2021-03-21 NOTE — Assessment & Plan Note (Signed)
Spontaneously resolved without treatment.

## 2021-04-24 ENCOUNTER — Other Ambulatory Visit: Payer: Self-pay

## 2021-06-12 ENCOUNTER — Other Ambulatory Visit: Payer: Self-pay | Admitting: Pulmonary Disease

## 2021-07-15 ENCOUNTER — Other Ambulatory Visit: Payer: Self-pay

## 2021-07-15 DIAGNOSIS — M19071 Primary osteoarthritis, right ankle and foot: Secondary | ICD-10-CM

## 2021-07-15 MED ORDER — MELOXICAM 15 MG PO TABS: 15.0000 mg | ORAL_TABLET | Freq: Every day | ORAL | 3 refills | Status: DC

## 2021-07-31 ENCOUNTER — Other Ambulatory Visit: Payer: Self-pay | Admitting: Internal Medicine

## 2021-07-31 DIAGNOSIS — Z1231 Encounter for screening mammogram for malignant neoplasm of breast: Secondary | ICD-10-CM

## 2021-08-15 DIAGNOSIS — M79671 Pain in right foot: Secondary | ICD-10-CM | POA: Diagnosis not present

## 2021-08-15 DIAGNOSIS — M19071 Primary osteoarthritis, right ankle and foot: Secondary | ICD-10-CM | POA: Diagnosis not present

## 2021-08-29 ENCOUNTER — Ambulatory Visit (INDEPENDENT_AMBULATORY_CARE_PROVIDER_SITE_OTHER): Payer: PPO | Admitting: Internal Medicine

## 2021-08-29 VITALS — BP 161/89 | HR 85 | Temp 97.5°F | Ht 64.0 in | Wt 130.8 lb

## 2021-08-29 DIAGNOSIS — M810 Age-related osteoporosis without current pathological fracture: Secondary | ICD-10-CM

## 2021-08-29 DIAGNOSIS — J449 Chronic obstructive pulmonary disease, unspecified: Secondary | ICD-10-CM

## 2021-08-29 DIAGNOSIS — H9201 Otalgia, right ear: Secondary | ICD-10-CM

## 2021-08-29 DIAGNOSIS — M79604 Pain in right leg: Secondary | ICD-10-CM

## 2021-08-29 DIAGNOSIS — Z170 Estrogen receptor positive status [ER+]: Secondary | ICD-10-CM

## 2021-08-29 DIAGNOSIS — M19071 Primary osteoarthritis, right ankle and foot: Secondary | ICD-10-CM

## 2021-08-29 DIAGNOSIS — I10 Essential (primary) hypertension: Secondary | ICD-10-CM | POA: Diagnosis not present

## 2021-08-29 DIAGNOSIS — M19041 Primary osteoarthritis, right hand: Secondary | ICD-10-CM | POA: Diagnosis not present

## 2021-08-29 DIAGNOSIS — R252 Cramp and spasm: Secondary | ICD-10-CM

## 2021-08-29 DIAGNOSIS — K219 Gastro-esophageal reflux disease without esophagitis: Secondary | ICD-10-CM | POA: Diagnosis not present

## 2021-08-29 DIAGNOSIS — Z87891 Personal history of nicotine dependence: Secondary | ICD-10-CM

## 2021-08-29 DIAGNOSIS — H903 Sensorineural hearing loss, bilateral: Secondary | ICD-10-CM | POA: Diagnosis not present

## 2021-08-29 DIAGNOSIS — E890 Postprocedural hypothyroidism: Secondary | ICD-10-CM

## 2021-08-29 DIAGNOSIS — R198 Other specified symptoms and signs involving the digestive system and abdomen: Secondary | ICD-10-CM | POA: Diagnosis not present

## 2021-08-29 DIAGNOSIS — C50411 Malignant neoplasm of upper-outer quadrant of right female breast: Secondary | ICD-10-CM | POA: Diagnosis not present

## 2021-08-29 DIAGNOSIS — G5601 Carpal tunnel syndrome, right upper limb: Secondary | ICD-10-CM

## 2021-08-29 NOTE — Assessment & Plan Note (Signed)
Adherent to Trelegy and as needed albuterol.  Symptoms are stable.  Not visibly dyspneic during speech, but will take occasional pauses for pursed lip breathing without tripod.  Voice is strong.  Now that masks are no longer required at pulmonary rehab, she is ready to begin therapy.  Referral for pulmonary rehab placed.

## 2021-08-29 NOTE — Assessment & Plan Note (Signed)
Finding it difficult/inconvenient to take Fosamax.  Started recently after experiencing some diarrhea.  Discussed the low likelihood that the 2 problems are related and encouraged her to resume.  She will consider.

## 2021-08-29 NOTE — Assessment & Plan Note (Signed)
Released from 5-year oncology follow-up.  Due for annual mammogram scheduled soon.  No concerns about recurrence at this time.

## 2021-08-29 NOTE — Progress Notes (Addendum)
[name] is here for routine follow-up of chronic arthritic pain, COPD, and general wellbeing and mobility.  She is looking forward to going to [facility] with her family from June 30 to July 7.  No further recurrences of the inguinal discomfort she had noted at last visit.  New problems to report are right ear pain, chronic burning right leg pain , and decline in activity level.  She is noticing a cycle of declining quality of life as her right ankle pain and breathing impair her ability to be active, which then causes her to have poor motivation and low mood.  Was referred by her local orthopedist to [facility] for consideration of ankle replacement or fusion surgery at their facility given high surgical risk related to her COPD.  Insurance did not cover.  See problem based documentation for further discussion of each of her acute and chronic conditions.    Meds reviewed; taking the following:  Current Outpatient Medications:    amLODipine (NORVASC) 10 MG tablet, Take 1 tablet (10 mg total) by mouth daily., Disp: 90 tablet, Rfl: 3   Fluticasone-Umeclidin-Vilant (TRELEGY ELLIPTA) 100-62.5-25 MCG/ACT AEPB, INHALE 1 PUFF ONCE DAILY, Disp: 60 each, Rfl: 2   levothyroxine (SYNTHROID) 88 MCG tablet, Take 1 tablet (88 mcg total) by mouth daily., Disp: 90 tablet, Rfl: 3   losartan (COZAAR) 100 MG tablet, Take 1 tablet (100 mg total) by mouth daily., Disp: 90 tablet, Rfl: 3   meloxicam (MOBIC) 15 MG tablet, Take 1 tablet (15 mg total) by mouth daily before breakfast., Disp: 90 tablet, Rfl: 3   omeprazole (PRILOSEC) 20 MG capsule, Take 20 mg by mouth daily before breakfast. , Disp: , Rfl:    Omeprazole-Sodium Bicarbonate (ZEGERID) 20-1100 MG CAPS capsule, Take 1 capsule by mouth daily before breakfast., Disp: , Rfl:    pravastatin (PRAVACHOL) 40 MG tablet, Take 1 tablet (40 mg total) by mouth daily before breakfast., Disp: 90 tablet, Rfl: 3   PROAIR HFA 108 (90 Base) MCG/ACT inhaler, INHALE 1 TO 2 PUFFS BY MOUTH  EVERY 6 HOURS AS NEEDED FOR WHEEZING FOR SHORTNESS OF BREATH, Disp: 18 g, Rfl: 11   Probiotic Product (ULTRAFLORA IMMUNE HEALTH PO), Take by mouth., Disp: , Rfl:    alendronate (FOSAMAX) 70 MG tablet, Take 1 tablet (70 mg total) by mouth every 7 (seven) days. Take with a full glass of water on an empty stomach., Disp: 4 tablet, Rfl: 11   azithromycin (ZITHROMAX) 250 MG tablet, Take 1 tablet (250 mg total) by mouth daily., Disp: 30 tablet, Rfl: 11   Cholecalciferol (VITAMIN D3) 25 MCG (1000 UT) CAPS, Take by mouth., Disp: , Rfl:    nicotine (NICOTROL) 10 MG inhaler, Nicotrol 10 mg inhalation cartridge  USE 1 CARTRIDGE 16 TIMES A DAY AS NEEDED, Disp: 42 each, Rfl: 3  BP (!) 161/89 (BP Location: Left Arm, Patient Position: Sitting, Cuff Size: Small)   Pulse 85   Temp (!) 97.5 F (36.4 C) (Oral)   Ht '5\' 4"'$  (1.626 m)   Wt 130 lb 12.8 oz (59.3 kg)   SpO2 100%   BMI 22.45 kg/m   Slender habitus, stable weight.  Problem specific physical exam documented in problem list.  Osteoporosis Finding it difficult/inconvenient to take Fosamax.  Started recently after experiencing some diarrhea.  Discussed the low likelihood that the 2 problems are related and encouraged her to resume.  She will consider.  Carcinoma of upper-outer quadrant of right breast in female, estrogen receptor positive ([place]) Released  from 5-year oncology follow-up.  Due for annual mammogram scheduled soon.  No concerns about recurrence at this time.  Degenerative joint disease of R ankle and foot Continues to receive steroid injections in the ankle approximately every 6 months.  Interested in considering surgery, though her orthopedist had recommended consultation at [facility] given her high operative risk related to advanced COPD.  [facility] does not accept her insurance.  Because this has been so limiting to her activity, mobility, quality of life, we agreed to optimize her pulmonary status by referral to pulmonary rehab, after which she will  have a follow-up visit with her pulmonologist Dr. Vaughan [name], at which time we can assess her operative risk.  She is willing to accept some risk if it would help her become more active.  Meloxicam has been helpful to some degree but never relieves her pain.  Takes PPI.  Check renal function today.  APAP and topical medications of been ineffective.  Hearing impairment No cerumen impaction today.  Post-surgical hypothyroidism Check TSH today.  Adjust dose as necessary.  No symptoms suggestive of hyper or hypothyroidism currently.  GERD without esophagitis No exacerbation with bisphosphonate.  Note that she takes both omeprazole 20 mg grams combined with sodium bicarb, as well as a separate omeprazole 20 mg, to equal 40 mg daily.  This is covered by insurance, whereas the 40 mg dose of omeprazole was not.  Symptoms well controlled.  Monitor.  COPD, group D, by GOLD 2017 classification ([place]) Adherent to Trelegy and as needed albuterol.  Symptoms are stable.  Not visibly dyspneic during speech, but will take occasional pauses for pursed lip breathing without tripod.  Voice is strong.  Now that masks are no longer required at pulmonary rehab, she is ready to begin therapy.  Referral for pulmonary rehab placed.  Hypertension Adherent to losartan 100 mg daily and amlodipine 10 mg daily.  HTN uncontrolled.  Home measurements are SBP in 130s to 150s, on occasion exceeding 180.  Higher episodes are asymptomatic.  She is resistant to adding any new medication.  We discussed how both pain and NSAIDs can contribute to elevated readings.  We discussed the risks of long-term uncontrolled HTN including strokes heart attacks and blood vessel disease.  Ongoing conversation.

## 2021-08-29 NOTE — Assessment & Plan Note (Signed)
Adherent to losartan 100 mg daily and amlodipine 10 mg daily.  HTN uncontrolled.  Home measurements are SBP in 130s to 150s, on occasion exceeding 180.  Higher episodes are asymptomatic.  She is resistant to adding any new medication.  We discussed how both pain and NSAIDs can contribute to elevated readings.  We discussed the risks of long-term uncontrolled HTN including strokes heart attacks and blood vessel disease.  Ongoing conversation.

## 2021-08-29 NOTE — Assessment & Plan Note (Signed)
Continues to receive steroid injections in the ankle approximately every 6 months.  Interested in considering surgery, though her orthopedist had recommended consultation at [facility] given her high operative risk related to advanced COPD.  [facility] does not accept her insurance.  Because this has been so limiting to her activity, mobility, quality of life, we agreed to optimize her pulmonary status by referral to pulmonary rehab, after which she will have a follow-up visit with her pulmonologist Dr. Vaughan Browner, at which time we can assess her operative risk.  She is willing to accept some risk if it would help her become more active.  Meloxicam has been helpful to some degree but never relieves her pain.  Takes PPI.  Check renal function today.  APAP and topical medications of been ineffective.

## 2021-08-29 NOTE — Assessment & Plan Note (Signed)
No cerumen impaction today.

## 2021-08-29 NOTE — Assessment & Plan Note (Signed)
No exacerbation with bisphosphonate.  Note that she takes both omeprazole 20 mg grams combined with sodium bicarb, as well as a separate omeprazole 20 mg, to equal 40 mg daily.  This is covered by insurance, whereas the 40 mg dose of omeprazole was not.  Symptoms well controlled.  Monitor.

## 2021-08-29 NOTE — Assessment & Plan Note (Signed)
Check TSH today.  Adjust dose as necessary.  No symptoms suggestive of hyper or hypothyroidism currently.

## 2021-08-30 LAB — BMP8+ANION GAP
Anion Gap: 17 mmol/L (ref 10.0–18.0)
BUN/Creatinine Ratio: 19 (ref 12–28)
BUN: 20 mg/dL (ref 8–27)
CO2: 22 mmol/L (ref 20–29)
Calcium: 10.8 mg/dL — ABNORMAL HIGH (ref 8.7–10.3)
Chloride: 102 mmol/L (ref 96–106)
Creatinine, Ser: 1.05 mg/dL — ABNORMAL HIGH (ref 0.57–1.00)
Glucose: 102 mg/dL — ABNORMAL HIGH (ref 70–99)
Potassium: 4.6 mmol/L (ref 3.5–5.2)
Sodium: 141 mmol/L (ref 134–144)
eGFR: 55 mL/min/{1.73_m2} — ABNORMAL LOW (ref >59)

## 2021-08-30 LAB — TSH: TSH: 1.79 u[IU]/mL (ref 0.450–4.500)

## 2021-09-06 ENCOUNTER — Encounter: Payer: Self-pay | Admitting: Internal Medicine

## 2021-09-11 ENCOUNTER — Other Ambulatory Visit: Payer: Self-pay | Admitting: Pulmonary Disease

## 2021-09-13 ENCOUNTER — Ambulatory Visit
Admission: RE | Admit: 2021-09-13 | Discharge: 2021-09-13 | Disposition: A | Discharge status: Home / Self Care | Payer: PPO | Source: Ambulatory Visit | Attending: Internal Medicine | Admitting: Internal Medicine

## 2021-09-13 DIAGNOSIS — Z1231 Encounter for screening mammogram for malignant neoplasm of breast: Secondary | ICD-10-CM

## 2021-10-09 ENCOUNTER — Other Ambulatory Visit: Payer: Self-pay

## 2021-10-09 MED ORDER — PRAVASTATIN SODIUM 40 MG PO TABS: 40.0000 mg | ORAL_TABLET | Freq: Every day | ORAL | 3 refills | Status: DC

## 2021-10-09 NOTE — Telephone Encounter (Signed)
pravastatin (PRAVACHOL) 40 MG tablet (Expired), REFILL [place] [address] [place] [address]

## 2021-10-28 ENCOUNTER — Ambulatory Visit: Payer: Self-pay | Admitting: Licensed Clinical Social Worker

## 2021-10-28 NOTE — Patient Outreach (Signed)
  Care Coordination   Initial Visit Note   10/28/2021 Name: Linda [name] MRN: [id number] DOB: [birth year]  Linda [name] is a 76 y.o. year old female who sees Jimmye Norman, Elaina Pattee, MD for primary care. Unsuccessful outreach to patient.   What matters to the patients health and wellness today?  SW reviewed chart in encounter to determine any needs or barriers. SW will follow up with patient on tomorrow.      SDOH assessments and interventions completed:   No   Care Coordination Interventions Activated:  No Care Coordination Interventions:  No, not indicated  Follow up plan: Follow up call scheduled for 08/01  Encounter Outcome:  Pt. Visit Completed

## 2021-12-11 ENCOUNTER — Other Ambulatory Visit: Payer: Self-pay | Admitting: Pulmonary Disease

## 2021-12-31 ENCOUNTER — Ambulatory Visit: Payer: PPO | Admitting: Pulmonary Disease

## 2021-12-31 ENCOUNTER — Encounter: Payer: Self-pay | Admitting: Pulmonary Disease

## 2021-12-31 VITALS — BP 134/72 | HR 82 | Temp 98.1°F | Ht 64.0 in | Wt 129.6 lb

## 2021-12-31 DIAGNOSIS — J449 Chronic obstructive pulmonary disease, unspecified: Secondary | ICD-10-CM

## 2021-12-31 DIAGNOSIS — Z720 Tobacco use: Secondary | ICD-10-CM

## 2021-12-31 DIAGNOSIS — Z87891 Personal history of nicotine dependence: Secondary | ICD-10-CM

## 2021-12-31 MED ORDER — AZITHROMYCIN 250 MG PO TABS: 250.0000 mg | ORAL_TABLET | Freq: Every day | ORAL | 11 refills | Status: DC

## 2021-12-31 NOTE — Progress Notes (Signed)
Linda [name]    [id number]    1945-07-25  Primary Care Physician:Williams, Elaina Pattee, MD  Referring Physician: Angelica Pou, MD [facility] [place],  [place] [address]  Chief complaint:   Follow up for  COPD GOLD D  HPI: Linda [name] is a 76 year-old with COPD (GOLD D, CAT score 25, multiple exacerbation over past year, FEV1 41%). Maintained on Spiriva and Symbicort. His symptoms have been stable until this year when she has several exacerbations over the winter requiring courses of antibiotics and steroids. Her primary care physician has referred her back to [place] for further management. Her chief complaint is dyspnea on exertion, loss of energy, fatigue,  chronic cough with sputum production.   She's had a diagnosis of breast cancer in April 2018 and underwent lumpectomy and XRT. She declined chemotherapy. Stopped smoking in January 2020.  She is admitted with recurrent exacerbations in 2020 requiring multiple courses of antibiotics and steroids Started on daily azithromycin in July 2020 and nebulizer therapy  Interim History Continues on chronic azithromycin and Trelegy Breathing is doing well with chronic dyspnea on exertion Mobility is limited by left ankle degenerative joint disease but she is still trying to exercise and do strength training in the gym.  Outpatient Encounter Medications as of 12/31/2021  Medication Sig   amLODipine (NORVASC) 10 MG tablet Take 1 tablet (10 mg total) by mouth daily.   azithromycin (ZITHROMAX) 250 MG tablet Take 1 tablet (250 mg total) by mouth daily.   Cholecalciferol (VITAMIN D3) 25 MCG (1000 UT) CAPS Take by mouth.   Fluticasone-Umeclidin-Vilant (TRELEGY ELLIPTA) 100-62.5-25 MCG/ACT AEPB INHALE 1 PUFF ONCE DAILY   levothyroxine (SYNTHROID) 88 MCG tablet Take 1 tablet (88 mcg total) by mouth daily.   losartan (COZAAR) 100 MG tablet Take 1 tablet (100 mg total) by mouth daily.   meloxicam (MOBIC) 15 MG tablet Take 1  tablet (15 mg total) by mouth daily before breakfast.   nicotine (NICOTROL) 10 MG inhaler Nicotrol 10 mg inhalation cartridge  USE 1 CARTRIDGE 16 TIMES A DAY AS NEEDED   omeprazole (PRILOSEC) 20 MG capsule Take 20 mg by mouth daily before breakfast.    Omeprazole-Sodium Bicarbonate (ZEGERID) 20-1100 MG CAPS capsule Take 1 capsule by mouth daily before breakfast.   pravastatin (PRAVACHOL) 40 MG tablet Take 1 tablet (40 mg total) by mouth daily before breakfast.   PROAIR HFA 108 (90 Base) MCG/ACT inhaler INHALE 1 TO 2 PUFFS BY MOUTH EVERY 6 HOURS AS NEEDED FOR WHEEZING FOR SHORTNESS OF BREATH   Probiotic Product (ULTRAFLORA IMMUNE HEALTH PO) Take by mouth.   [DISCONTINUED] alendronate (FOSAMAX) 70 MG tablet Take 1 tablet (70 mg total) by mouth every 7 (seven) days. Take with a full glass of water on an empty stomach.   No facility-administered encounter medications on file as of 12/31/2021.   Physical Exam: Blood pressure 134/72, pulse 82, temperature 98.1 F (36.7 C), temperature source Oral, height '5\' 4"'$  (1.626 m), weight 129 lb 9.6 oz (58.8 kg), SpO2 90 %. Gen:      No acute distress HEENT:  EOMI, sclera anicteric Neck:     No masses; no thyromegaly Lungs:    Clear to auscultation bilaterally; normal respiratory effort CV:         Regular rate and rhythm; no murmurs Abd:      + bowel sounds; soft, non-tender; no palpable masses, no distension Ext:    No edema; adequate peripheral  perfusion Skin:      Warm and dry; no rash Neuro: alert and oriented x 3 Psych: normal mood and affect   Data Reviewed: Imaging CT chest 02/26/15- mild left lower lobe atelectasis, moderate emphysematous changes.  CT chest 08/01/16- moderate emphysematous changes.  No suspicious lung nodules or mass.  Chronic scarring in the right lower lobe. Chest x-ray 08/10/2019-emphysema, no acute cardiopulmonary abnormality. Chest x-ray 12/26/2020-emphysematous changes with no acute cardiopulmonary abnormality. I have  reviewed the images personally  PFTs  02/18/11 FVC 2.42 [117%), FEV1 1.46 (69%), F/F 46, TLC 112%, DLCO 69% Moderate obstructive defect, mild reduction in diffusion capacity.   08/23/15 FVC 2.14 [71%], FEV1 0.94 [41%], F/F 44, TLC 114%, DLCO 57% Severe obstructive defect with moderate reduction in diffusion capacity  Labs: Alpha-1 antitrypsin 10/05/18-205, PI MM  Assessment:   Severe COPD. GOLD stage D Continue trelegy inhaler Unable to afford Daliresp Continue azithromycin to 250 mg daily for anti-inflammatory effect.  She has seen an ENT doctor and [facility] last year. I recommended follow-up for audiology testing but she wants to hold off for now. Frequency of exacerbations have reduced since she was started on chronic azithromycin  She has been referred for low-dose screening CT of the chest but she did not return the phone calls last year We will make referral again  Health maintenance 01/12/2014-Prevnar 04/01/2011-Pneumovax  Up-to-date with COVID-19 booster and flu vaccine  Plan/Recommendations: - Continue Trelegy - Daily azithromycin - Albuterol, duo nebs  Marshell Garfinkel MD [facility] 12/31/2021, 4:16 PM  CC: Angelica Pou, MD

## 2021-12-31 NOTE — Patient Instructions (Signed)
We will refer you for low-dose screening CT of the chest Continue the inhalers Continue exercise regimen Follow-up in 6 months

## 2022-01-08 ENCOUNTER — Other Ambulatory Visit: Payer: Self-pay

## 2022-01-08 DIAGNOSIS — E890 Postprocedural hypothyroidism: Secondary | ICD-10-CM

## 2022-01-08 MED ORDER — LEVOTHYROXINE SODIUM 88 MCG PO TABS: 88.0000 ug | ORAL_TABLET | Freq: Every day | ORAL | 3 refills | Status: DC

## 2022-01-16 ENCOUNTER — Other Ambulatory Visit: Payer: Self-pay

## 2022-01-16 DIAGNOSIS — Z122 Encounter for screening for malignant neoplasm of respiratory organs: Secondary | ICD-10-CM

## 2022-01-16 DIAGNOSIS — F17210 Nicotine dependence, cigarettes, uncomplicated: Secondary | ICD-10-CM

## 2022-01-16 DIAGNOSIS — Z87891 Personal history of nicotine dependence: Secondary | ICD-10-CM

## 2022-01-30 ENCOUNTER — Encounter: Payer: Self-pay | Admitting: Internal Medicine

## 2022-01-30 ENCOUNTER — Ambulatory Visit (INDEPENDENT_AMBULATORY_CARE_PROVIDER_SITE_OTHER): Payer: PPO | Admitting: Internal Medicine

## 2022-01-30 VITALS — BP 123/71 | HR 74 | Temp 97.5°F | Ht 64.0 in | Wt 130.5 lb

## 2022-01-30 DIAGNOSIS — I10 Essential (primary) hypertension: Secondary | ICD-10-CM

## 2022-01-30 DIAGNOSIS — Z87891 Personal history of nicotine dependence: Secondary | ICD-10-CM | POA: Diagnosis not present

## 2022-01-30 DIAGNOSIS — J449 Chronic obstructive pulmonary disease, unspecified: Secondary | ICD-10-CM

## 2022-01-30 MED ORDER — LOSARTAN POTASSIUM 100 MG PO TABS: 100.0000 mg | ORAL_TABLET | Freq: Every day | ORAL | 3 refills | Status: DC

## 2022-01-30 MED ORDER — AMLODIPINE BESYLATE 10 MG PO TABS: 10.0000 mg | ORAL_TABLET | Freq: Every day | ORAL | 3 refills | Status: DC

## 2022-01-30 NOTE — Progress Notes (Signed)
Routine f/u for Linda [name], a very active woman despite advanced COPD, for which she recently saw her pulmonologist. No changes in breathing medicines.  Screening chest ct ordered Still desires referral to pulmonary rehab.  R hip still hurts, and L hip hurts, worse when weather changed.  Cold negatively affects her.  BP 123/71 (BP Location: Left Arm, Patient Position: Sitting, Cuff Size: Small)   Pulse 74   Temp (!) 97.5 F (36.4 C) (Oral)   Ht '5\' 4"'$  (1.626 m)   Wt 130 lb 8 oz (59.2 kg)   SpO2 100%   BMI 22.40 kg/m  Looks well and youthful. Speaks in full sentences without increased effort, though pursed lip breathing is chronic and stable.  Radial pulses strong and regular.  Moves without assistive device rather briskly despite her chronic orthopedic problems.  Weight is remaining stable (BMI 22ish)  Assessment and plan:  Hypertension Well controlled on current regimen (norvasc 10, losartan 100).  Continue to monitor.  COPD, group D, by GOLD 2017 classification [facility]) Eager to pursue CP rehab - referral placed.  She continues to f/u with her pulmonologist.

## 2022-02-10 NOTE — Assessment & Plan Note (Addendum)
Well controlled on current regimen (norvasc 10, losartan 100).  Continue to monitor.

## 2022-02-10 NOTE — Assessment & Plan Note (Signed)
Eager to pursue CP rehab - referral placed.  She continues to f/u with her pulmonologist.

## 2022-02-12 ENCOUNTER — Ambulatory Visit (INDEPENDENT_AMBULATORY_CARE_PROVIDER_SITE_OTHER): Payer: PPO | Admitting: Acute Care

## 2022-02-12 ENCOUNTER — Encounter: Payer: Self-pay | Admitting: Acute Care

## 2022-02-12 DIAGNOSIS — Z87891 Personal history of nicotine dependence: Secondary | ICD-10-CM

## 2022-02-12 NOTE — Patient Instructions (Signed)
Thank you for participating in the [facility]. It was our pleasure to meet you today. We will call you with the results of your scan within the next few days. Your scan will be assigned a Lung RADS category score by the physicians reading the scans.  This Lung RADS score determines follow up scanning.  See below for description of categories, and follow up screening recommendations. We will be in touch to schedule your follow up screening annually or based on recommendations of our providers. We will fax a copy of your scan results to your Primary Care Physician, or the physician who referred you to the program, to ensure they have the results. Please call the office if you have any questions or concerns regarding your scanning experience or results.  Our office number is [phone]. Please speak with Denise Phelps, RN. , or  Denise Buckner RN, They are  our Lung Cancer Screening RN.'s If They are unavailable when you call, Please leave a message on the voice mail. We will return your call at our earliest convenience.This voice mail is monitored several times a day.  Remember, if your scan is normal, we will scan you annually as long as you continue to meet the criteria for the program. (Age 55-77, Current smoker or smoker who has quit within the last 15 years). If you are a smoker, remember, quitting is the single most powerful action that you can take to decrease your risk of lung cancer and other pulmonary, breathing related problems. We know quitting is hard, and we are here to help.  Please let us know if there is anything we can do to help you meet your goal of quitting. If you are a former smoker, congratulations. We are proud of you! Remain smoke free! Remember you can refer friends or family members through the number above.  We will screen them to make sure they meet criteria for the program. Thank you for helping us take better care of you by  participating in Lung Screening.  You can receive free nicotine replacement therapy ( patches, gum or mints) by calling 1-800-QUIT NOW. Please call so we can get you on the path to becoming  a non-smoker. I know it is hard, but you can do this!  Lung RADS Categories:  Lung RADS 1: no nodules or definitely non-concerning nodules.  Recommendation is for a repeat annual scan in 12 months.  Lung RADS 2:  nodules that are non-concerning in appearance and behavior with a very low likelihood of becoming an active cancer. Recommendation is for a repeat annual scan in 12 months.  Lung RADS 3: nodules that are probably non-concerning , includes nodules with a low likelihood of becoming an active cancer.  Recommendation is for a 6-month repeat screening scan. Often noted after an upper respiratory illness. We will be in touch to make sure you have no questions, and to schedule your 6-month scan.  Lung RADS 4 A: nodules with concerning findings, recommendation is most often for a follow up scan in 3 months or additional testing based on our provider's assessment of the scan. We will be in touch to make sure you have no questions and to schedule the recommended 3 month follow up scan.  Lung RADS 4 B:  indicates findings that are concerning. We will be in touch with you to schedule additional diagnostic testing based on our provider's  assessment of the scan.  Other options for assistance in smoking cessation (   As covered by your insurance benefits)  Hypnosis for smoking cessation  [employer] [phone]  Acupuncture for smoking cessation  [facility] [phone]   

## 2022-02-12 NOTE — Progress Notes (Signed)
Virtual Visit via Telephone Note  I connected with Linda [name] on 02/12/22 at 11:00 AM EST by telephone and verified that I am speaking with the correct person using two identifiers.  Location: Patient:  At home Provider: [place], [place], [place], [address]    I discussed the limitations, risks, security and privacy concerns of performing an evaluation and management service by telephone and the availability of in person appointments. I also discussed with the patient that there may be a patient responsible charge related to this service. The patient expressed understanding and agreed to proceed.   Shared Decision Making Visit Lung Cancer Screening Program [phone])   Eligibility: Age 76 y.o. Pack Years Smoking History Calculation 38 pack year smoking history (# packs/per year x # years smoked) Recent History of coughing up blood  no Unexplained weight loss? no ( >Than 15 pounds within the last 6 months ) Prior History Lung / other cancer no (Diagnosis within the last 5 years already requiring surveillance chest CT Scans). Smoking Status Former Smoker Former Smokers: Years since quit: 3 years  Quit Date: 2020  Visit Components: Discussion included one or more decision making aids. yes Discussion included risk/benefits of screening. yes Discussion included potential follow up diagnostic testing for abnormal scans. yes Discussion included meaning and risk of over diagnosis. yes Discussion included meaning and risk of False Positives. yes Discussion included meaning of total radiation exposure. yes  Counseling Included: Importance of adherence to annual lung cancer LDCT screening. yes Impact of comorbidities on ability to participate in the program. yes Ability and willingness to under diagnostic treatment. yes  Smoking Cessation Counseling: Current Smokers:  Discussed importance of smoking cessation. yes Information about tobacco cessation classes and  interventions provided to patient. yes Patient provided with "ticket" for LDCT Scan. yes Symptomatic Patient. no  Counseling NA Diagnosis Code: Tobacco Use Z72.0 Asymptomatic Patient yes  Counseling (Intermediate counseling: > three minutes counseling) [id number] Former Smokers:  Discussed the importance of maintaining cigarette abstinence. yes Diagnosis Code: Personal History of Nicotine Dependence. W29.562 Information about tobacco cessation classes and interventions provided to patient. Yes Patient provided with "ticket" for LDCT Scan. yes Written Order for Lung Cancer Screening with LDCT placed in Epic. Yes (CT Chest Lung Cancer Screening Low Dose W/O CM) [id number] Z12.2-Screening of respiratory organs Z87.891-Personal history of nicotine dependence  I spent 25 minutes of face to face time/virtual visit time  with  Linda [name] discussing the risks and benefits of lung cancer screening. We took the time to pause the power point at intervals to allow for questions to be asked and answered to ensure understanding. We discussed that she had taken the single most powerful action possible to decrease her risk of developing lung cancer when she quit smoking. I counseled her to remain smoke free, and to contact me if she ever had the desire to smoke again so that I can provide resources and tools to help support the effort to remain smoke free. We discussed the time and location of the scan, and that either  Doroteo Glassman RN, Joella Prince, RN or I  or I will call / send a letter with the results within  24-72 hours of receiving them. She has the office contact information in the event she needs to speak with me,  she verbalized understanding of all of the above and had no further questions upon leaving the office.     I explained to the patient that there has been  a high incidence of coronary artery disease noted on these exams. I explained that this is a non-gated exam therefore degree or severity cannot  be determined. This patient is on statin therapy. I have asked the patient to follow-up with their PCP regarding any incidental finding of coronary artery disease and management with diet or medication as they feel is clinically indicated. The patient verbalized understanding of the above and had no further questions.     Magdalen Spatz, NP 02/12/2022

## 2022-02-14 ENCOUNTER — Ambulatory Visit
Admission: RE | Admit: 2022-02-14 | Discharge: 2022-02-14 | Disposition: A | Discharge status: Home / Self Care | Payer: PPO | Source: Ambulatory Visit | Attending: Acute Care | Admitting: Acute Care

## 2022-02-14 DIAGNOSIS — Z853 Personal history of malignant neoplasm of breast: Secondary | ICD-10-CM | POA: Diagnosis not present

## 2022-02-14 DIAGNOSIS — K449 Diaphragmatic hernia without obstruction or gangrene: Secondary | ICD-10-CM | POA: Diagnosis not present

## 2022-02-14 DIAGNOSIS — I2510 Atherosclerotic heart disease of native coronary artery without angina pectoris: Secondary | ICD-10-CM | POA: Diagnosis not present

## 2022-02-14 DIAGNOSIS — Z87891 Personal history of nicotine dependence: Secondary | ICD-10-CM | POA: Diagnosis not present

## 2022-02-14 DIAGNOSIS — Z122 Encounter for screening for malignant neoplasm of respiratory organs: Secondary | ICD-10-CM

## 2022-02-14 DIAGNOSIS — F17210 Nicotine dependence, cigarettes, uncomplicated: Secondary | ICD-10-CM

## 2022-02-17 ENCOUNTER — Other Ambulatory Visit: Payer: Self-pay | Admitting: Acute Care

## 2022-02-17 DIAGNOSIS — Z122 Encounter for screening for malignant neoplasm of respiratory organs: Secondary | ICD-10-CM

## 2022-02-17 DIAGNOSIS — Z87891 Personal history of nicotine dependence: Secondary | ICD-10-CM

## 2022-03-12 DIAGNOSIS — M79671 Pain in right foot: Secondary | ICD-10-CM | POA: Diagnosis not present

## 2022-03-12 DIAGNOSIS — M19071 Primary osteoarthritis, right ankle and foot: Secondary | ICD-10-CM | POA: Diagnosis not present

## 2022-03-31 IMAGING — MG DIGITAL DIAGNOSTIC BILAT W/ TOMO W/ CAD
6 of 10 series · 6 of 30 positions shown · non-contrast
Comparison: Previous exam(s).

CLINICAL DATA: Malignant lumpectomy of the UPPER OUTER QUADRANT of
the RIGHT breast in 6563 with adjuvant radiation therapy. Annual
evaluation.

EXAM:
DIGITAL DIAGNOSTIC BILATERAL MAMMOGRAM WITH TOMO AND CAD

[R MLO synth-2D (1 of 2)]
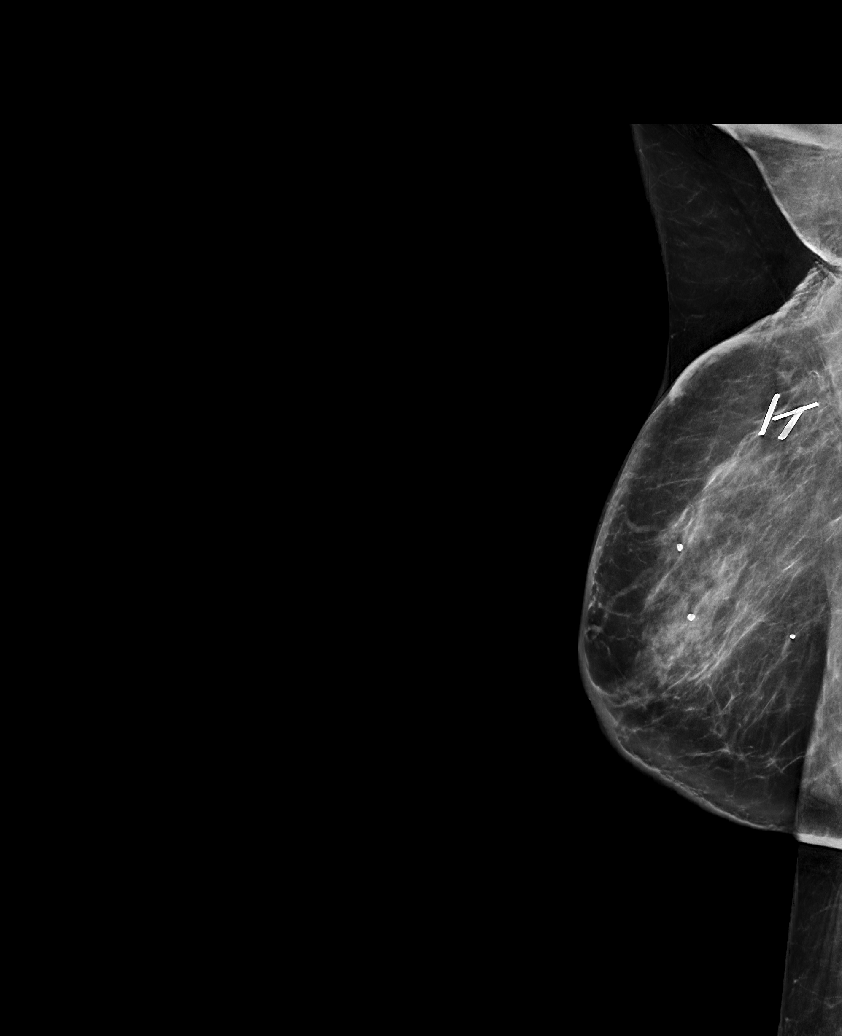

[R CC synth-2D]
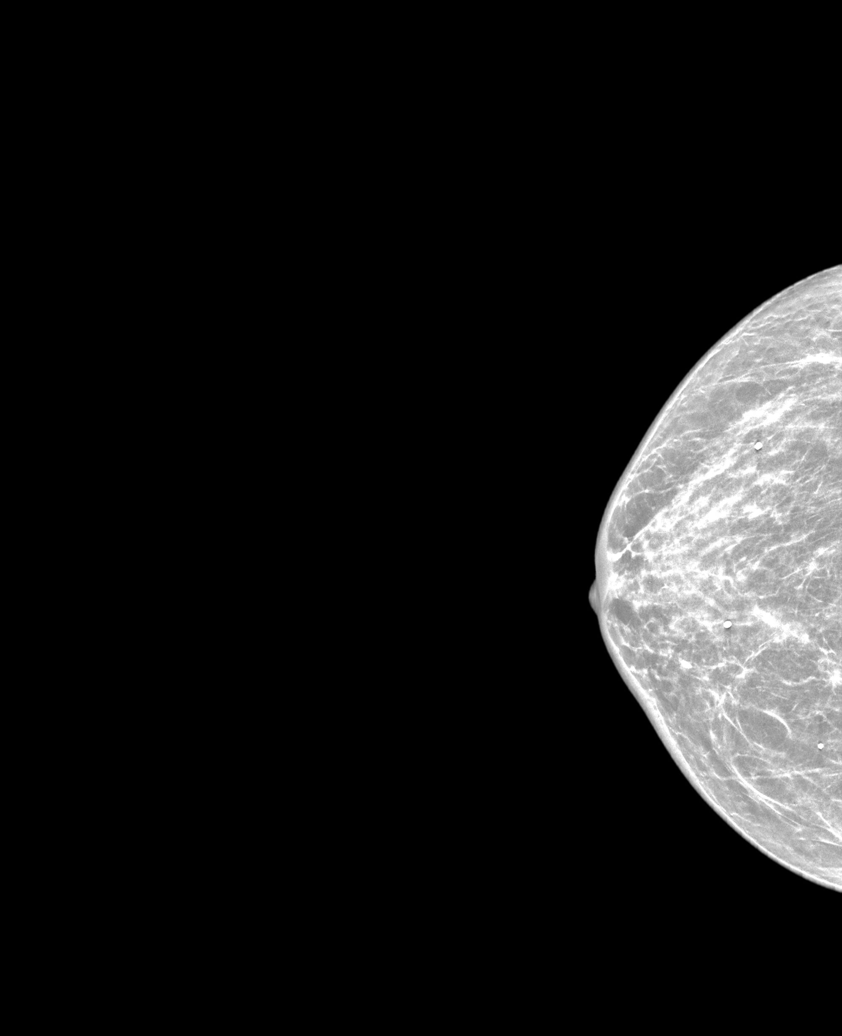

[L MLO synth-2D]
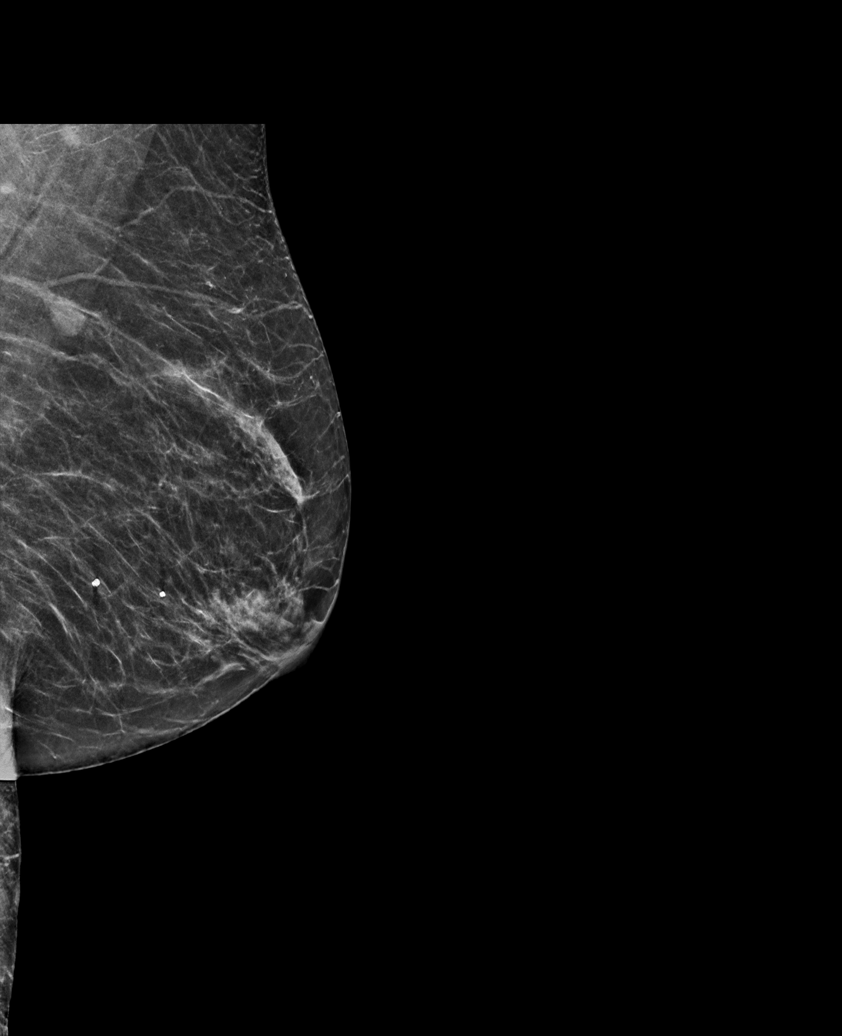

[R MLO synth-2D (2 of 2)]
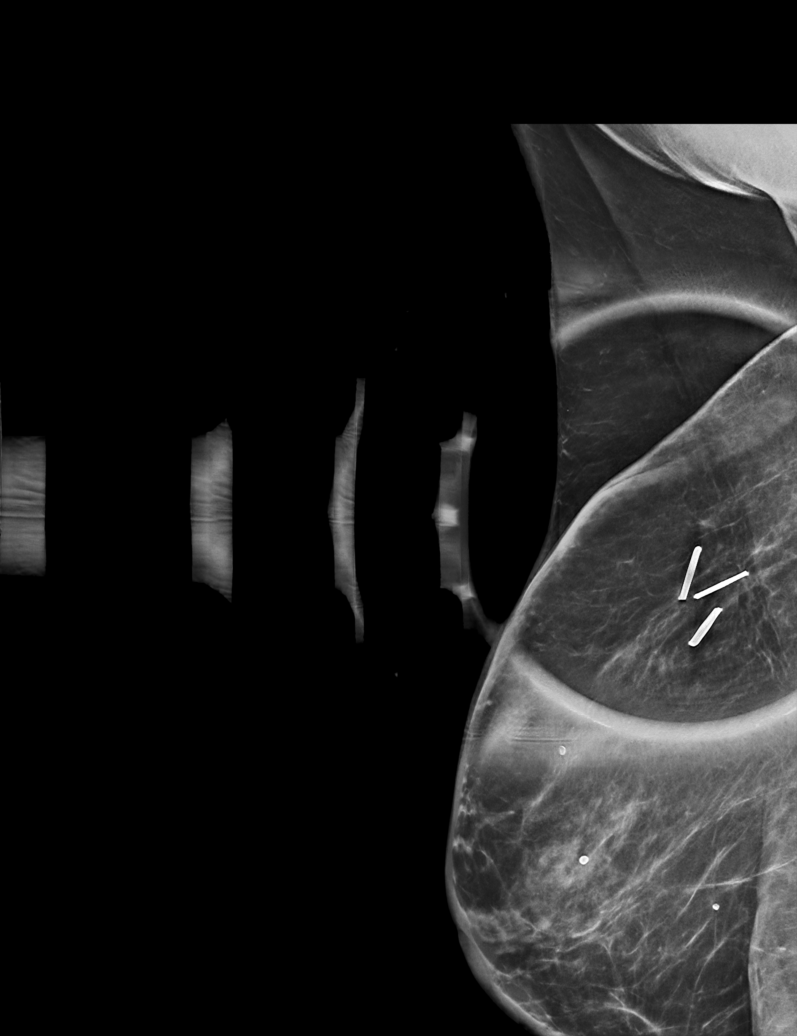

[L CC synth-2D]
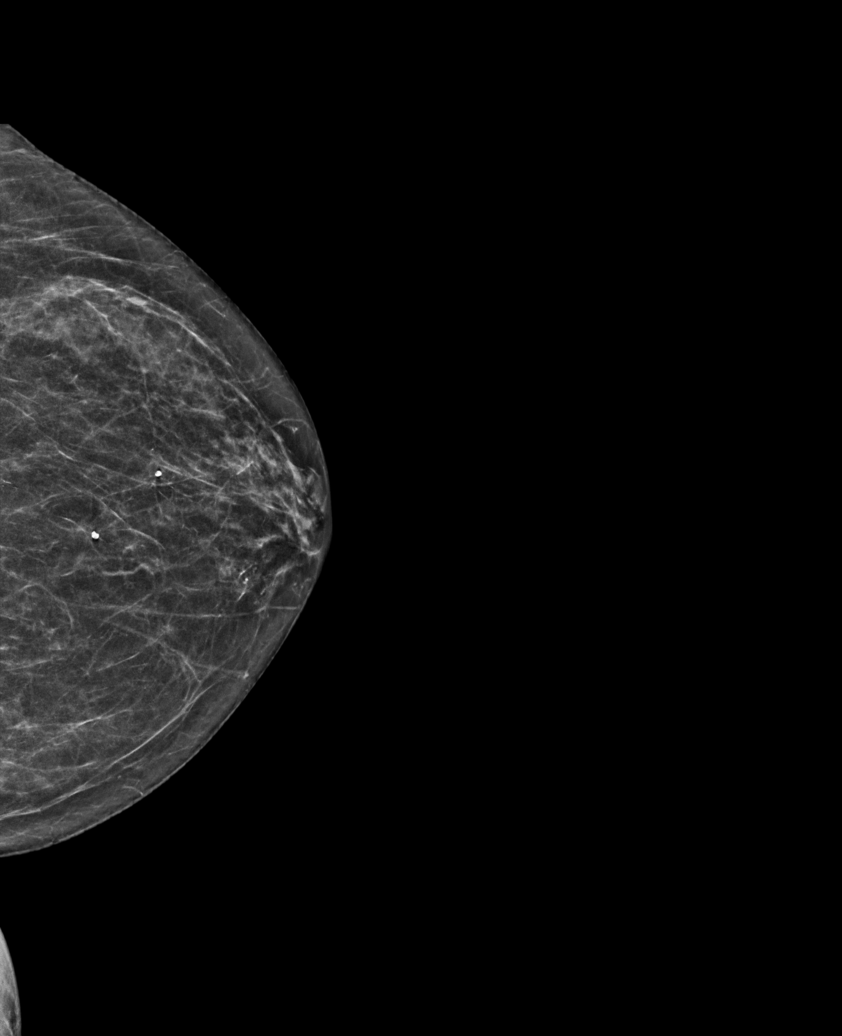

[L MLO tomo · tomo slice 31/60.0]
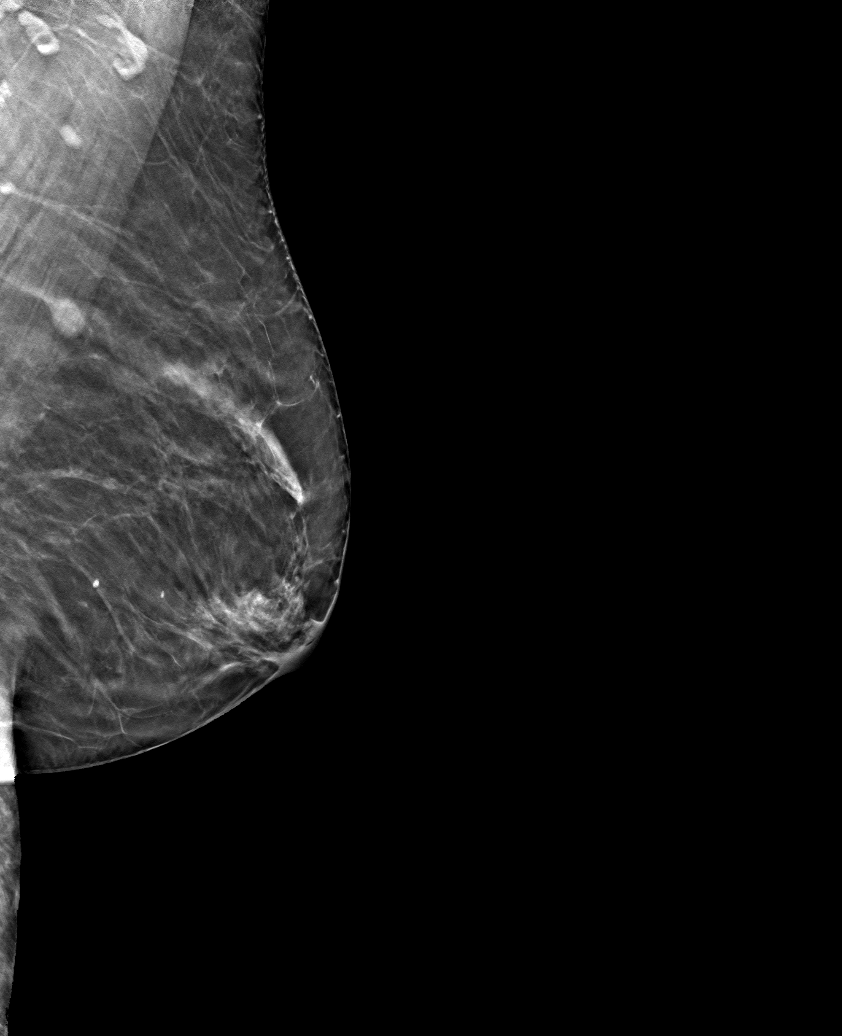

[6 of 30 positions shown; findings below may reference images not displayed]

ACR Breast Density Category b: There are scattered areas of
fibroglandular density.
FINDINGS: Tomosynthesis and synthesized full field CC and MLO views of both
breasts were obtained. Tomosynthesis and synthesized spot tangential
view of the lumpectomy site in the RIGHT breast was also obtained.

Post surgical scar/architectural distortion at the lumpectomy site
in the UPPER OUTER RIGHT breast at POSTERIOR depth. Residual mild
post radiation trabecular thickening involving the RIGHT breast. No
new or suspicious findings in the RIGHT breast.

No findings suspicious for malignancy in the LEFT breast.

Mammographic images were processed with CAD.
IMPRESSION: 1. No mammographic evidence of malignancy involving either breast.
2. Expected post lumpectomy changes and residual mild post radiation
changes involving the RIGHT breast.

RECOMMENDATION:
BILATERAL diagnostic mammography in 1 year.

I have discussed the findings and recommendations with the patient.
If applicable, a reminder letter will be sent to the patient
regarding the next appointment.

BI-RADS CATEGORY  2: Benign.

## 2022-04-22 ENCOUNTER — Telehealth: Payer: Self-pay

## 2022-04-22 NOTE — Telephone Encounter (Signed)
Pt is requesting a call back .Marland Kitchen She stated that she has been having vertigo for the past few wks  and she has some OTC meds that she is not sure she soul be taking the meds along with her regular meds  I tried to offer her and appt with Dr Elliot Gurney 1/25 @ 9:15  but she stated she is not coming in and she only wants Dr Jimmye Norman

## 2022-04-22 NOTE — Telephone Encounter (Signed)
Return pt's call who stated she has been taking OTC Meclizine for vertigo and Zofran (from her daughter) for nausea. She wanted to know if ok to take these meds and they will not interact with her other medications. Also she stated she had a CT scan and she's concern with the findings.

## 2022-04-24 NOTE — Telephone Encounter (Signed)
Pt called and informed of Dr Jimmye Norman' response. Stated she cannot come in this am for an appt. Stated she continues having the dizziness along with the nausea.

## 2022-04-28 ENCOUNTER — Telehealth: Payer: Self-pay | Admitting: *Deleted

## 2022-04-28 DIAGNOSIS — I10 Essential (primary) hypertension: Secondary | ICD-10-CM

## 2022-04-28 DIAGNOSIS — E7841 Elevated Lipoprotein(a): Secondary | ICD-10-CM

## 2022-04-28 NOTE — Telephone Encounter (Signed)
Call from patient is having a bad case of Vertigo.  Asking to see Dr. Jimmye Norman only.  Dr. Jimmye Norman has no available appointments.  Patient then asked to see a female doctor and not a resident at all.  Patient also mentioned that she is awaiting a call from Dr. Jimmye Norman about her MRI.  Patient refuses to also see Dr. Jimmye Norman along with a female Resident.  Patient was informed that a message would be sent to Dr. Jimmye Norman to see if she will give her a call about this situation as well as to discuss her MRI Results.

## 2022-04-28 NOTE — Telephone Encounter (Signed)
Spoke to Ms. Vig by phone.  Two weeks of BPPV  including vomiting.  Has had prior episodes for which she underwent vestibular rehab and has been doing her vertigo exercises daily which help but incompletely relieve sxs, though hasn't tried repeating them more than once at a time.  Took meclizine once which caused her to feel poorly (she had never taken it before).  Taking prn zofran (about once daily) from her dtr's prescription which helps prevent vomiting.  Sxs occur with rightward gaze.  No change in hearing or pain in left ear.  Feels unsteady with walking and uses walls to steady herself.  Able to accomplish ADLs but spending most of the day lying down.  No symptoms overnight.  No family in town to help currently.  Has enough groceries for the tiime being.  We also discussed the incidental finding on screening chest CT of CAD.  Former smoker, well controlled HTN, due for lipid panel.  She will come for a lab only visit and I will see her in March if not before then.  Problems with refills on losartan, I'll call pharmacy when it opens tomorrow.  She will try 1/2 tab meclizine.  Plan Zofran ODT prescription.  She declines vestibular rehab at this time as she is already very good about following their home exercise/maneuver program.

## 2022-04-29 ENCOUNTER — Other Ambulatory Visit: Payer: Self-pay | Admitting: Internal Medicine

## 2022-04-29 DIAGNOSIS — R112 Nausea with vomiting, unspecified: Secondary | ICD-10-CM

## 2022-04-29 MED ORDER — ONDANSETRON 4 MG PO TBDP: 4.0000 mg | ORAL_TABLET | Freq: Every day | ORAL | 0 refills | Status: DC | PRN

## 2022-04-29 NOTE — Telephone Encounter (Signed)
I have since spoken to Linda [name] by phone and discussed her concerns.

## 2022-05-06 ENCOUNTER — Other Ambulatory Visit (INDEPENDENT_AMBULATORY_CARE_PROVIDER_SITE_OTHER): Payer: PPO

## 2022-05-06 DIAGNOSIS — I10 Essential (primary) hypertension: Secondary | ICD-10-CM

## 2022-05-06 DIAGNOSIS — E7841 Elevated Lipoprotein(a): Secondary | ICD-10-CM | POA: Diagnosis not present

## 2022-05-07 LAB — LIPID PANEL
Chol/HDL Ratio: 3.9 ratio (ref 0.0–4.4)
Cholesterol, Total: 154 mg/dL (ref 100–199)
HDL: 40 mg/dL (ref >39)
LDL Chol Calc (NIH): 93 mg/dL (ref 0–99)
Triglycerides: 115 mg/dL (ref 0–149)
VLDL Cholesterol Cal: 21 mg/dL (ref 5–40)

## 2022-05-07 LAB — CMP14 + ANION GAP
ALT: 16 IU/L (ref 0–32)
AST: 19 IU/L (ref 0–40)
Albumin/Globulin Ratio: 2.2 (ref 1.2–2.2)
Albumin: 4.6 g/dL (ref 3.8–4.8)
Alkaline Phosphatase: 76 IU/L (ref 44–121)
Anion Gap: 15 mmol/L (ref 10.0–18.0)
BUN/Creatinine Ratio: 29 — ABNORMAL HIGH (ref 12–28)
BUN: 29 mg/dL — ABNORMAL HIGH (ref 8–27)
Bilirubin Total: 0.3 mg/dL (ref 0.0–1.2)
CO2: 22 mmol/L (ref 20–29)
Calcium: 9.6 mg/dL (ref 8.7–10.3)
Chloride: 106 mmol/L (ref 96–106)
Creatinine, Ser: 1.01 mg/dL — ABNORMAL HIGH (ref 0.57–1.00)
Globulin, Total: 2.1 g/dL (ref 1.5–4.5)
Glucose: 95 mg/dL (ref 70–99)
Potassium: 4.3 mmol/L (ref 3.5–5.2)
Sodium: 143 mmol/L (ref 134–144)
Total Protein: 6.7 g/dL (ref 6.0–8.5)
eGFR: 57 mL/min/{1.73_m2} — ABNORMAL LOW (ref >59)

## 2022-05-16 DIAGNOSIS — L821 Other seborrheic keratosis: Secondary | ICD-10-CM | POA: Diagnosis not present

## 2022-05-16 DIAGNOSIS — L814 Other melanin hyperpigmentation: Secondary | ICD-10-CM | POA: Diagnosis not present

## 2022-05-16 DIAGNOSIS — L853 Xerosis cutis: Secondary | ICD-10-CM | POA: Diagnosis not present

## 2022-05-16 DIAGNOSIS — L720 Epidermal cyst: Secondary | ICD-10-CM | POA: Diagnosis not present

## 2022-06-10 ENCOUNTER — Other Ambulatory Visit: Payer: Self-pay | Admitting: Pulmonary Disease

## 2022-07-07 ENCOUNTER — Other Ambulatory Visit: Payer: Self-pay

## 2022-07-07 DIAGNOSIS — M19071 Primary osteoarthritis, right ankle and foot: Secondary | ICD-10-CM

## 2022-07-07 MED ORDER — MELOXICAM 15 MG PO TABS: 15.0000 mg | ORAL_TABLET | Freq: Every day | ORAL | 3 refills | Status: DC

## 2022-08-04 ENCOUNTER — Other Ambulatory Visit: Payer: Self-pay | Admitting: Internal Medicine

## 2022-08-04 DIAGNOSIS — Z1231 Encounter for screening mammogram for malignant neoplasm of breast: Secondary | ICD-10-CM

## 2022-08-19 DIAGNOSIS — M19071 Primary osteoarthritis, right ankle and foot: Secondary | ICD-10-CM | POA: Diagnosis not present

## 2022-09-16 ENCOUNTER — Ambulatory Visit
Admission: RE | Admit: 2022-09-16 | Discharge: 2022-09-16 | Disposition: A | Discharge status: Home / Self Care | Payer: PPO | Source: Ambulatory Visit | Attending: Internal Medicine | Admitting: Internal Medicine

## 2022-09-16 DIAGNOSIS — Z1231 Encounter for screening mammogram for malignant neoplasm of breast: Secondary | ICD-10-CM | POA: Diagnosis not present

## 2022-09-29 ENCOUNTER — Other Ambulatory Visit: Payer: Self-pay

## 2022-09-30 MED ORDER — PRAVASTATIN SODIUM 40 MG PO TABS: 40.0000 mg | ORAL_TABLET | Freq: Every day | ORAL | 3 refills | Status: DC

## 2022-09-30 NOTE — Telephone Encounter (Signed)
Patient called she stated she is out of the medication and she will be going out of town. Patient is requesting rx asap.

## 2022-10-28 ENCOUNTER — Ambulatory Visit (HOSPITAL_COMMUNITY)
Admission: EM | Admit: 2022-10-28 | Discharge: 2022-10-28 | Disposition: A | Discharge status: Home / Self Care | Payer: PPO | Attending: Emergency Medicine | Admitting: Emergency Medicine

## 2022-10-28 ENCOUNTER — Telehealth: Payer: Self-pay | Admitting: *Deleted

## 2022-10-28 ENCOUNTER — Encounter (HOSPITAL_COMMUNITY): Payer: Self-pay | Admitting: *Deleted

## 2022-10-28 DIAGNOSIS — R350 Frequency of micturition: Secondary | ICD-10-CM | POA: Diagnosis not present

## 2022-10-28 DIAGNOSIS — R3915 Urgency of urination: Secondary | ICD-10-CM | POA: Diagnosis not present

## 2022-10-28 DIAGNOSIS — R1030 Lower abdominal pain, unspecified: Secondary | ICD-10-CM

## 2022-10-28 LAB — POCT URINALYSIS DIP (MANUAL ENTRY)
Bilirubin, UA: NEGATIVE
Blood, UA: NEGATIVE
Glucose, UA: NEGATIVE mg/dL
Ketones, POC UA: NEGATIVE mg/dL
Leukocytes, UA: NEGATIVE
Nitrite, UA: NEGATIVE
Protein Ur, POC: NEGATIVE mg/dL
Spec Grav, UA: 1.01 (ref 1.010–1.025)
Urobilinogen, UA: 0.2 E.U./dL
pH, UA: 7 (ref 5.0–8.0)

## 2022-10-28 MED ORDER — TAMSULOSIN HCL 0.4 MG PO CAPS: 0.4000 mg | ORAL_CAPSULE | Freq: Every day | ORAL | 0 refills | Status: DC

## 2022-10-28 MED ORDER — CEPHALEXIN 500 MG PO CAPS: 500.0000 mg | ORAL_CAPSULE | Freq: Two times a day (BID) | ORAL | 0 refills | Status: AC

## 2022-10-28 NOTE — ED Provider Notes (Signed)
[facility]    CSN: [id number] Arrival date & time: 10/28/22  1351      History   Chief Complaint Chief Complaint  Patient presents with   Urinary Frequency   Abdominal Pain    HPI Linda [name] is a 77 y.o. female.   Patient presents for evaluation of urinary frequency, urgency, dysuria and lower abdominal pain present for 7 days.  Abdominal pain occurring intermittently primarily with the sensation of urgency to urinate, radiates to the bilateral flanks.  Urine is dark and, described as a reddish-brown color, increase fluid intake and now urine is currently clear to yellow color.  Has attempted to increase fluid intake with water and 0 sugar cranberry juice, some improvement seen.  Denies vaginal symptoms.  Takes a daily Zithromax for lungs.  All symptoms started while in [place] on vacation, high temperatures.  Past Medical History:  Diagnosis Date   Arthritis    "qwhere; hands, feet, different body parts from time to time" (05/08/2016)   Asthma    Breast mass 07/03/2016   Cancer (HCC)    right breast   Complication of anesthesia    "they used an adult tube going down my throat; caused alot of problems; was told to always have pediatric tube placed" (05/08/2016)   COPD (chronic obstructive pulmonary disease) (HCC)    COVID-19 03/28/2020   GERD (gastroesophageal reflux disease)    Hearing impairment 03/21/2021   ENT eval and hearing aides provided in past, which were unsatisfactory.  At conversational volume she frequently requests something to be repeated.  She would like a second opinion at a completely different ENT practice but is not quite ready to pursue this.    History of hiatal hernia    History of radiation therapy 10/07/16- 11/17/16   Right Breast and regional nodes 50 Gy in 25 fractions to breast and at least 45 Gy in 25 fractions to nodes, Right Breast boost 10 Gy in 5 fractions.    Hyperglycemia    Hyperlipidemia    Hypertension    Hypothyroidism     Lump of breast, right    "suppose to have it checked soon" (05/08/2016)   Migraine    "none for years" (05/08/2016)   Osteopenia    Personal history of radiation therapy 2018   Pneumonia "several times"   Post-surgical hypothyroidism 05/09/2016   Status post radioactive iodine thyroid ablation    Thyroid disease     Patient Active Problem List   Diagnosis Date Noted   Muscle cramps 08/29/2021   Carpal tunnel syndrome, right 08/29/2021   Irregular bowel habits 08/29/2021   Arthritis of right hand and wrist 08/29/2021   Acute ear pain, right 08/29/2021   Pain in right leg 08/29/2021   Hearing impairment 03/21/2021   GERD without esophagitis 03/21/2021   BPPV (benign paroxysmal positional vertigo) 06/05/2020   Hypertension 06/05/2020   Former smoker 05/28/2018   Degenerative joint disease of R ankle and foot 12/15/2017   Carcinoma of upper-outer quadrant of right breast in female, estrogen receptor positive (HCC) 07/25/2016   Osteoporosis 05/27/2016   Post-surgical hypothyroidism 05/09/2016   Hx of closed left femoral fracture (HCC) 05/2016 due to fall from upright position groundlevel 05/08/2016   COPD, group D, by GOLD 2017 classification (HCC) 01/28/2011    Past Surgical History:  Procedure Laterality Date   ANKLE SURGERY Right X 2   fibrocystic tumors come up in my joints   APPENDECTOMY  1963   BREAST BIOPSY  Right 07/09/2016   malignant   BREAST LUMPECTOMY Right 08/14/2016   BREAST LUMPECTOMY WITH AXILLARY LYMPH NODE DISSECTION Right 08/14/2016   Procedure: RIGHT BREAST LUMPECTOMY WITH AXILLARY LYMPH NODE DISSECTION;  Surgeon: Almond Lint, MD;  Location: [facility];  Service: General;  Laterality: Right;   DILATION AND CURETTAGE OF UTERUS  "several"   "when I was young"   INGUINAL HERNIA REPAIR Bilateral 2000   MASS EXCISION Right 08/14/2016   Procedure: EXCISION RIGHT ARM MASS 2X3CM;  Surgeon: Almond Lint, MD;  Location: [facility];  Service: General;  Laterality: Right;    POLYPECTOMY     "vocal cord"   TONSILLECTOMY     TOTAL HIP ARTHROPLASTY Left 05/09/2016   Procedure: TOTAL HIP ARTHROPLASTY ANTERIOR APPROACH;  Surgeon: Durene Romans, MD;  Location: [facility];  Service: Orthopedics;  Laterality: Left;   TUBAL LIGATION     VAGINAL HYSTERECTOMY  1979    OB History   No obstetric history on file.      Home Medications    Prior to Admission medications   Medication Sig Start Date End Date Taking? Authorizing Provider  amLODipine (NORVASC) 10 MG tablet Take 1 tablet (10 mg total) by mouth daily. 01/30/22 01/25/23 Yes Miguel Aschoff, MD  azithromycin (ZITHROMAX) 250 MG tablet Take 1 tablet (250 mg total) by mouth daily. 12/31/21  Yes Mannam, Praveen, MD  Cholecalciferol (VITAMIN D3) 25 MCG (1000 UT) CAPS Take by mouth.   Yes [provider]  Fluticasone-Umeclidin-Vilant (TRELEGY ELLIPTA) 100-62.5-25 MCG/ACT AEPB Inhale 1 puff by mouth once daily 06/10/22  Yes Mannam, Praveen, MD  levothyroxine (SYNTHROID) 88 MCG tablet Take 1 tablet (88 mcg total) by mouth daily. 01/08/22  Yes Miguel Aschoff, MD  losartan (COZAAR) 100 MG tablet Take 1 tablet (100 mg total) by mouth daily. 01/30/22 01/25/23 Yes Miguel Aschoff, MD  meloxicam (MOBIC) 15 MG tablet Take 1 tablet (15 mg total) by mouth daily before breakfast. 07/07/22  Yes Miguel Aschoff, MD  nicotine (NICOTROL) 10 MG inhaler Nicotrol 10 mg inhalation cartridge  USE 1 CARTRIDGE 16 TIMES A DAY AS NEEDED 10/30/20  Yes Miguel Aschoff, MD  omeprazole (PRILOSEC) 20 MG capsule Take 20 mg by mouth daily before breakfast.    Yes [provider]  Omeprazole-Sodium Bicarbonate (ZEGERID) 20-1100 MG CAPS capsule Take 1 capsule by mouth daily before breakfast.   Yes [provider]  ondansetron (ZOFRAN-ODT) 4 MG disintegrating tablet Take 1 tablet (4 mg total) by mouth daily as needed for up to 12 doses for nausea or vomiting. 04/29/22  Yes Miguel Aschoff, MD  pravastatin  (PRAVACHOL) 40 MG tablet Take 1 tablet (40 mg total) by mouth daily before breakfast. 09/30/22 09/25/23 Yes Miguel Aschoff, MD  PROAIR HFA 108 [id number] Base) MCG/ACT inhaler INHALE 1 TO 2 PUFFS BY MOUTH EVERY 6 HOURS AS NEEDED FOR WHEEZING FOR SHORTNESS OF BREATH 12/26/20  Yes Mannam, Praveen, MD  Probiotic Product (ULTRAFLORA IMMUNE HEALTH PO) Take by mouth.   Yes [provider]    Family History Family History  Problem Relation Age of Onset   Hypertension Mother    Stroke Mother    Heart attack Mother    Allergies Mother    Hypertension Father    Lung cancer Father    Diabetes Sister    Heart disease Sister    Lupus Sister    Allergies Daughter    Breast cancer Neg Hx     Social History Social History  Tobacco Use   Smoking status: Former    Current packs/day: 0.00    Average packs/day: 0.8 packs/day for 38.0 years (28.5 ttl pk-yrs)    Types: Cigarettes    Start date: 05/01/1980    Quit date: 05/01/2018    Years since quitting: 4.4   Smokeless tobacco: Never   Tobacco comments:    Using Nicotrol Inhaler from primary care  Vaping Use   Vaping status: Every Day  Substance Use Topics   Alcohol use: Yes    Comment: Occasionally   Drug use: No     Allergies   Aspirin   Review of Systems Review of Systems  Respiratory: Negative.    Cardiovascular: Negative.   Gastrointestinal: Negative.   Genitourinary:  Positive for dysuria, frequency, pelvic pain and urgency. Negative for decreased urine volume, difficulty urinating, dyspareunia, enuresis, flank pain, genital sores, hematuria, menstrual problem, vaginal bleeding, vaginal discharge and vaginal pain.     Physical Exam Triage Vital Signs ED Triage Vitals  Encounter Vitals Group     BP 10/28/22 1428 (!) 179/86     Systolic BP Percentile --      Diastolic BP Percentile --      Pulse Rate 10/28/22 1428 94     Resp 10/28/22 1428 20     Temp 10/28/22 1428 (!) 97.4 F (36.3 C)     Temp Source 10/28/22 1428  Oral     SpO2 10/28/22 1428 93 %     Weight --      Height --      Head Circumference --      Peak Flow --      Pain Score 10/28/22 1427 5     Pain Loc --      Pain Education --      Exclude from Growth Chart --    No data found.  Updated Vital Signs BP (!) 179/86 (BP Location: Left Arm)   Pulse 94   Temp (!) 97.4 F (36.3 C) (Oral)   Resp 20   SpO2 93%   Visual Acuity Right Eye Distance:   Left Eye Distance:   Bilateral Distance:    Right Eye Near:   Left Eye Near:    Bilateral Near:     Physical Exam Constitutional:      Appearance: She is well-developed.  Eyes:     Extraocular Movements: Extraocular movements intact.  Pulmonary:     Effort: Pulmonary effort is normal.  Abdominal:     General: Abdomen is flat. Bowel sounds are normal.     Palpations: Abdomen is soft.     Tenderness: There is abdominal tenderness in the right lower quadrant, suprapubic area and left lower quadrant.  Neurological:     Mental Status: She is alert and oriented to person, place, and time. Mental status is at baseline.      UC Treatments / Results  Labs (all labs ordered are listed, but only abnormal results are displayed) Labs Reviewed  POCT URINALYSIS DIP (MANUAL ENTRY) - Abnormal; Notable for the following components:      Result Value   Color, UA light yellow (*)    All other components within normal limits    EKG   Radiology No results found.  Procedures Procedures (including critical care time)  Medications Ordered in UC Medications - No data to display  Initial Impression / Assessment and Plan / UC Course  I have reviewed the triage vital signs and the nursing notes.  Pertinent labs &  imaging results that were available during my care of the patient were reviewed by me and considered in my medical decision making (see chart for details).  Urinary frequency, urgency and lower abdominal pain  Unknown etiology, discussed with patient, urinalysis is negative,  sent to culture, possible causes kidney stones, discussed x-ray imaging available, not definitive testing therefore patient is chosen to defer at this time, prophylactically placed on cephalexin and Flomax, discussed administration, recommended continued increase fluid intake for supportive care, if symptoms continue to persist past use of medicine patient is to follow-up with her primary doctor, does voice concern as she has had difficulty being seen by her personal physician, encouraged her to be seen by whichever provider is available for reevaluation as she will need to be reassessed if symptoms or not resolving, may follow-up with this urgent care as needed Final Clinical Impressions(s) / UC Diagnoses   Final diagnoses:  None   Discharge Instructions   None    ED Prescriptions   None    PDMP not reviewed this encounter.   Valinda Hoar, [place] 10/28/22 [phone]

## 2022-10-28 NOTE — Telephone Encounter (Signed)
Patient called in c/o 1 week hx of painful urination with urgency and frequency. States she was in [place] last week where it was > 100 degrees. Urine was dark reddish orange at that time and she was having difficulty voiding. Denies fevers but had chills. Also, states pain on left and right lower quadrants. She has increased water intake and urine is now her usual color. There are no openings in [facility] for 2 weeks. Offered patient to come to [facility] today to leave urine sample, but she declined. Advised she head to UC/ED today.  She would not commit to going. Explained importance of being seen today as untreated UTIs can cause serious complications. She still would not commit to going.

## 2022-10-28 NOTE — Discharge Instructions (Addendum)
Today you were evaluated for urinary symptoms  Urinalysis today does not show any signs of infection, urine has been sent to the lab to determine if any bacteria will grow, if this occurs you will be notified, if any changes need to be made to your medicine based on bacteria seen you will be notified and new medicine sent to the pharmacy  In the meantime prophylactically begin cephalexin every morning and every evening for 5 days  Continue to drink increased water and cranberry to help flush the kidneys and bladder  It is also possible that your symptoms are being caused by a kidney stone, we will prophylactically provide treatment  May take Tylenol every 6 hours as needed for pain  Begin tamsulosin every morning for 7 days, this medicine relaxes the urine through which allows for the stone to move through easier lessening the pain felt  Unfortunately in the urgent care setting we only have x-ray imaging available which is not always able to visualize stone therefore we have chosen together to not complete imaging today   if your symptoms continue to persist past medication prescribed today please schedule follow-up visit with your primary office even if you are unable to see your personal doctor

## 2022-10-28 NOTE — Telephone Encounter (Signed)
Left vm at about 3:15p for Linda [name] responding to her call.  I will try again before the end of the day.  In lieu of connecting with her, I also advised UC or f/u with one of our physicians who would be happy to see her.  I advised that our concern is that she could have a kidney infection.  I see no antibiotics on her allergy list.  Stop gap would be an empiric treatment with strict RTC instructions if she didn't experience improvement and resolution.

## 2022-10-28 NOTE — ED Triage Notes (Signed)
Pt states that she was out of town last week. She states she had trouble urinating due to dry conditions. She is now having some dark brown urine with red in it. She is having lower abdominal pain and urine frequency.

## 2022-10-29 ENCOUNTER — Ambulatory Visit: Payer: PPO | Admitting: Internal Medicine

## 2022-10-29 ENCOUNTER — Other Ambulatory Visit: Payer: Self-pay

## 2022-10-29 ENCOUNTER — Emergency Department (HOSPITAL_COMMUNITY): Payer: PPO

## 2022-10-29 ENCOUNTER — Ambulatory Visit (INDEPENDENT_AMBULATORY_CARE_PROVIDER_SITE_OTHER): Payer: PPO | Admitting: Student

## 2022-10-29 ENCOUNTER — Encounter: Payer: Self-pay | Admitting: Student

## 2022-10-29 ENCOUNTER — Emergency Department (HOSPITAL_COMMUNITY)
Admission: EM | Admit: 2022-10-29 | Discharge: 2022-10-30 | Disposition: A | Discharge status: Home / Self Care | Payer: PPO | Attending: Emergency Medicine | Admitting: Emergency Medicine

## 2022-10-29 ENCOUNTER — Telehealth: Payer: Self-pay | Admitting: *Deleted

## 2022-10-29 VITALS — BP 146/68 | HR 81 | Temp 97.8°F | Wt 128.0 lb

## 2022-10-29 DIAGNOSIS — Z853 Personal history of malignant neoplasm of breast: Secondary | ICD-10-CM | POA: Insufficient documentation

## 2022-10-29 DIAGNOSIS — R1030 Lower abdominal pain, unspecified: Secondary | ICD-10-CM

## 2022-10-29 DIAGNOSIS — Z7951 Long term (current) use of inhaled steroids: Secondary | ICD-10-CM | POA: Diagnosis not present

## 2022-10-29 DIAGNOSIS — Z79899 Other long term (current) drug therapy: Secondary | ICD-10-CM | POA: Diagnosis not present

## 2022-10-29 DIAGNOSIS — J449 Chronic obstructive pulmonary disease, unspecified: Secondary | ICD-10-CM | POA: Insufficient documentation

## 2022-10-29 DIAGNOSIS — N132 Hydronephrosis with renal and ureteral calculous obstruction: Secondary | ICD-10-CM | POA: Insufficient documentation

## 2022-10-29 DIAGNOSIS — R109 Unspecified abdominal pain: Secondary | ICD-10-CM | POA: Diagnosis present

## 2022-10-29 DIAGNOSIS — K449 Diaphragmatic hernia without obstruction or gangrene: Secondary | ICD-10-CM | POA: Diagnosis not present

## 2022-10-29 DIAGNOSIS — R34 Anuria and oliguria: Secondary | ICD-10-CM | POA: Diagnosis not present

## 2022-10-29 DIAGNOSIS — R300 Dysuria: Secondary | ICD-10-CM

## 2022-10-29 DIAGNOSIS — N179 Acute kidney failure, unspecified: Secondary | ICD-10-CM | POA: Insufficient documentation

## 2022-10-29 DIAGNOSIS — K7689 Other specified diseases of liver: Secondary | ICD-10-CM | POA: Diagnosis not present

## 2022-10-29 DIAGNOSIS — I10 Essential (primary) hypertension: Secondary | ICD-10-CM | POA: Diagnosis not present

## 2022-10-29 LAB — URINALYSIS, COMPLETE (UACMP) WITH MICROSCOPIC
Bilirubin Urine: NEGATIVE
Glucose, UA: NEGATIVE mg/dL
Ketones, ur: NEGATIVE mg/dL
Nitrite: NEGATIVE
Protein, ur: NEGATIVE mg/dL
Specific Gravity, Urine: 1.027 (ref 1.005–1.030)
pH: 5 (ref 5.0–8.0)

## 2022-10-29 LAB — COMPREHENSIVE METABOLIC PANEL
ALT: 19 U/L (ref 0–44)
AST: 23 U/L (ref 15–41)
Albumin: 4.3 g/dL (ref 3.5–5.0)
Alkaline Phosphatase: 52 U/L (ref 38–126)
Anion gap: 11 (ref 5–15)
BUN: 27 mg/dL — ABNORMAL HIGH (ref 8–23)
CO2: 23 mmol/L (ref 22–32)
Calcium: 9.5 mg/dL (ref 8.9–10.3)
Chloride: 103 mmol/L (ref 98–111)
Creatinine, Ser: 1.79 mg/dL — ABNORMAL HIGH (ref 0.44–1.00)
GFR, Estimated: 29 mL/min — ABNORMAL LOW (ref >60)
Glucose, Bld: 111 mg/dL — ABNORMAL HIGH (ref 70–99)
Potassium: 4 mmol/L (ref 3.5–5.1)
Sodium: 137 mmol/L (ref 135–145)
Total Bilirubin: 0.6 mg/dL (ref 0.3–1.2)
Total Protein: 6.8 g/dL (ref 6.5–8.1)

## 2022-10-29 LAB — URINE CULTURE

## 2022-10-29 LAB — URINALYSIS, ROUTINE W REFLEX MICROSCOPIC
Bilirubin Urine: NEGATIVE
Glucose, UA: NEGATIVE mg/dL
Ketones, ur: NEGATIVE mg/dL
Nitrite: NEGATIVE
Protein, ur: 30 mg/dL — AB
Specific Gravity, Urine: 1.026 (ref 1.005–1.030)
pH: 5 (ref 5.0–8.0)

## 2022-10-29 LAB — I-STAT CHEM 8, ED
BUN: 29 mg/dL — ABNORMAL HIGH (ref 8–23)
Calcium, Ion: 1.16 mmol/L (ref 1.15–1.40)
Chloride: 104 mmol/L (ref 98–111)
Creatinine, Ser: 1.9 mg/dL — ABNORMAL HIGH (ref 0.44–1.00)
Glucose, Bld: 106 mg/dL — ABNORMAL HIGH (ref 70–99)
HCT: 42 % (ref 36.0–46.0)
Hemoglobin: 14.3 g/dL (ref 12.0–15.0)
Potassium: 4 mmol/L (ref 3.5–5.1)
Sodium: 138 mmol/L (ref 135–145)
TCO2: 23 mmol/L (ref 22–32)

## 2022-10-29 MED ORDER — FENTANYL CITRATE PF 50 MCG/ML IJ SOSY
50.0000 ug | PREFILLED_SYRINGE | Freq: Once | INTRAMUSCULAR | Status: AC
  Administered 2022-10-29: 50 ug via INTRAVENOUS
  Filled 2022-10-29: qty 1

## 2022-10-29 MED ORDER — SODIUM CHLORIDE 0.9 % IV BOLUS
1000.0000 mL | Freq: Once | INTRAVENOUS | Status: AC
  Administered 2022-10-29: 1000 mL via INTRAVENOUS

## 2022-10-29 MED ORDER — ONDANSETRON HCL 4 MG/2ML IJ SOLN
4.0000 mg | Freq: Once | INTRAMUSCULAR | Status: AC
  Administered 2022-10-29: 4 mg via INTRAVENOUS
  Filled 2022-10-29: qty 2

## 2022-10-29 MED ORDER — HYDROCODONE-ACETAMINOPHEN 5-325 MG PO TABS
1.0000 | ORAL_TABLET | Freq: Once | ORAL | Status: AC
  Administered 2022-10-29: 1 via ORAL
  Filled 2022-10-29: qty 1

## 2022-10-29 NOTE — ED Triage Notes (Addendum)
Pt c/o RLQ pain that radiates to right side of backx1wk. Pt was having oliguria, urinary frequencyx1wk. Pt states she had dark brown urine on Monday. Pt's PCP wants to r/o kidney failure.

## 2022-10-29 NOTE — Assessment & Plan Note (Addendum)
This 77 yo woman presented to our clinic after a week-long history of dysuria with increased suprapubic tenderness that progressed to acute bilateral flank pain over the past week while traveling to [place]. Patient was seen at [facility] yesterday for worsening of suprapubic and flank pain. U/A with dipstick negative for pyuria, hematuria, or proteinuria. She was initially prescribed Tamsulosin and Keflex, however, after U/A and culture results were negative, patient was told to discontinue it.   Patient requested Telehealth visit with [facility] with Dr. August Saucer. During her interview, her symptoms were concerning for urinary retention vs nephrolithiasis. Patient declined recommendation to be sent to the Emergency Department and instead presented for an in-person evaluation.  Patient was able to confirm the symptoms above and documented in Dr. Diamantina Providence and UC provider's note. She also reported that she has been making very small amounts of urine for the past week. She denied new medications. Most bothersome is 10/10 lower abdominal pain with bilateral flank pain worse on R>L. Sometimes it feels like the pain starts on the R flank and radiates to the suprapubic region and vicersa. Continues to have increased dysuria and frequency. She has not changed her level of hydration. Continues to take all her prescribed medications. Reports nausea and vomiting. Besides a one time episode of a loose bowel moment on Monday, patient reports no changes in her bowel habits. Of note, patient has a history of appendectomy.  On exam, there was exquisite tenderness over the suprapubic region and bilateral CVA. Bowel sounds were normal. Bladder scan in office with no urine in the bladder. Patient voided x2 during her visit with <10cc each time, which is about the same volume she has been able to void for the past 72 hours, and has worsened since symptoms started about a week ago.  Her polyuria and dysuria is concerning for UTI vs pyelonephritis vs  nephrolithiasis. However, her decreased urine output are concerning for acute renal injury vs failure. I was unable to get a STAT CT scan done today. Attempted to place request for direct admission given her acute, increased pain, oliguria, and concern for AKI. However, bed placement calculated earliest bed available would be on 8/1 in the AM. Given this, patient was sent to the ED from our office.  Prior to departure, we repeated U/A with microscopy positive for hematuria and leukocytes without significant bacteria burden. Her STAT CMP showed Cr of 1.79 from 1.0-1.11 baseline and GFR of 29, confirming diagnosis of AKI. Given symptom onset and progression differential include infectious/AIN vs obstructive vs ATN.  Microscopic analysis still pending by the time this note was redacted. Patient was instructed to return to clinic after being discharged from the ED/hospital evaluation. I discussed results with patient's daughter, who is accompanying Linda [name] in the Emergency Department. She is awaiting a CT scan. We will discuss with the [facility] on-call resident as it is likely patient will need to be admitted to the hospital for further evaluation and treatment.

## 2022-10-29 NOTE — Telephone Encounter (Signed)
Call from patient c/o abdominal pain and nausea and vomiting .  Went to the Urgent Care on yesterday .  Was given an Antibiotic for a UTI or Kidney Stones. No pain medication.  Patient states has medication for Nausea will take 1.  Patient was advised to wait a few minutes and then take her Antibiotic. Patient was told to take Tylenol at Urgent Care but will take an Aleve.  Unable to get in today for ana appointment.  Offered a TeleHealth appointment for today.

## 2022-10-29 NOTE — Progress Notes (Signed)
Subjective:  CC: worsening suprapubic pain  HPI:  Linda [name] is a 77 y.o. female with a past medical history stated below and presents today for suprapubic pain, urinary frequency and decreased urine volume. Please see problem based assessment and plan for additional details.  Past Medical History:  Diagnosis Date   Arthritis    "qwhere; hands, feet, different body parts from time to time" (05/08/2016)   Asthma    Breast mass 07/03/2016   Cancer (HCC)    right breast   Complication of anesthesia    "they used an adult tube going down my throat; caused alot of problems; was told to always have pediatric tube placed" (05/08/2016)   COPD (chronic obstructive pulmonary disease) (HCC)    COVID-19 03/28/2020   GERD (gastroesophageal reflux disease)    Hearing impairment 03/21/2021   ENT eval and hearing aides provided in past, which were unsatisfactory.  At conversational volume she frequently requests something to be repeated.  She would like a second opinion at a completely different ENT practice but is not quite ready to pursue this.    History of hiatal hernia    History of radiation therapy 10/07/16- 11/17/16   Right Breast and regional nodes 50 Gy in 25 fractions to breast and at least 45 Gy in 25 fractions to nodes, Right Breast boost 10 Gy in 5 fractions.    Hyperglycemia    Hyperlipidemia    Hypertension    Hypothyroidism    Lump of breast, right    "suppose to have it checked soon" (05/08/2016)   Migraine    "none for years" (05/08/2016)   Osteopenia    Personal history of radiation therapy 2018   Pneumonia "several times"   Post-surgical hypothyroidism 05/09/2016   Status post radioactive iodine thyroid ablation    Thyroid disease     Current Outpatient Medications on File Prior to Visit  Medication Sig Dispense Refill   amLODipine (NORVASC) 10 MG tablet Take 1 tablet (10 mg total) by mouth daily. 90 tablet 3   azithromycin (ZITHROMAX) 250 MG tablet Take 1  tablet (250 mg total) by mouth daily. 30 tablet 11   cephALEXin (KEFLEX) 500 MG capsule Take 1 capsule (500 mg total) by mouth 2 (two) times daily for 5 days. 10 capsule 0   Cholecalciferol (VITAMIN D3) 25 MCG (1000 UT) CAPS Take by mouth.     Fluticasone-Umeclidin-Vilant (TRELEGY ELLIPTA) 100-62.5-25 MCG/ACT AEPB Inhale 1 puff by mouth once daily 60 each 5   levothyroxine (SYNTHROID) 88 MCG tablet Take 1 tablet (88 mcg total) by mouth daily. 90 tablet 3   losartan (COZAAR) 100 MG tablet Take 1 tablet (100 mg total) by mouth daily. 90 tablet 3   meloxicam (MOBIC) 15 MG tablet Take 1 tablet (15 mg total) by mouth daily before breakfast. 90 tablet 3   nicotine (NICOTROL) 10 MG inhaler Nicotrol 10 mg inhalation cartridge  USE 1 CARTRIDGE 16 TIMES A DAY AS NEEDED 42 each 3   omeprazole (PRILOSEC) 20 MG capsule Take 20 mg by mouth daily before breakfast.      Omeprazole-Sodium Bicarbonate (ZEGERID) 20-1100 MG CAPS capsule Take 1 capsule by mouth daily before breakfast.     ondansetron (ZOFRAN-ODT) 4 MG disintegrating tablet Take 1 tablet (4 mg total) by mouth daily as needed for up to 12 doses for nausea or vomiting. 12 tablet 0   pravastatin (PRAVACHOL) 40 MG tablet Take 1 tablet (40 mg total) by mouth daily before breakfast.  90 tablet 3   PROAIR HFA 108 (90 Base) MCG/ACT inhaler INHALE 1 TO 2 PUFFS BY MOUTH EVERY 6 HOURS AS NEEDED FOR WHEEZING FOR SHORTNESS OF BREATH 18 g 11   Probiotic Product (ULTRAFLORA IMMUNE HEALTH PO) Take by mouth.     tamsulosin (FLOMAX) 0.4 MG CAPS capsule Take 1 capsule (0.4 mg total) by mouth daily. 7 capsule 0   No current facility-administered medications on file prior to visit.    Family History  Problem Relation Age of Onset   Hypertension Mother    Stroke Mother    Heart attack Mother    Allergies Mother    Hypertension Father    Lung cancer Father    Diabetes Sister    Heart disease Sister    Lupus Sister    Allergies Daughter    Breast cancer Neg Hx      Social History   Socioeconomic History   Marital status: Divorced    Spouse name: Not on file   Number of children: 3   Years of education: Not on file   Highest education level: Not on file  Occupational History   Occupation: retired  Tobacco Use   Smoking status: Former    Current packs/day: 0.00    Average packs/day: 0.8 packs/day for 38.0 years (28.5 ttl pk-yrs)    Types: Cigarettes    Start date: 05/01/1980    Quit date: 05/01/2018    Years since quitting: 4.4   Smokeless tobacco: Never   Tobacco comments:    Using Nicotrol Inhaler from primary care  Vaping Use   Vaping status: Every Day  Substance and Sexual Activity   Alcohol use: Yes    Comment: Occasionally   Drug use: No   Sexual activity: Not Currently  Other Topics Concern   Not on file  Social History Narrative   Current Social History 10/17/2020        Patient lives alone in a home which is 1 story. There are not steps up to the entrance the patient uses.       Patient's method of transportation is personal car.      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      The patient currently retired.      Identified important Relationships are Daughters       Pets : None, pet recently passed away       Interests / Fun: travel, [place] traveled [place]       Current Stressors: none       Religious / Personal Beliefs: "I believe in [place]"       Other: N/A        Social Determinants of Health   Financial Resource Strain: Not on file  Food Insecurity: Not on file  Transportation Needs: Not on file  Physical Activity: Not on file  Stress: Not on file  Social Connections: Not on file  Intimate Partner Violence: Not on file    Review of Systems: ROS negative except for what is noted on the assessment and plan.  Objective:   Vitals:   10/29/22 1529 10/29/22 1559  BP: (!) 157/69 (!) 146/68  Pulse: 91 81  Temp: 97.8 F (36.6 C)   TempSrc: Oral   SpO2: 96%   Weight:  128 lb (58.1 kg)     Physical Exam: Constitutional: Elderly woman in acute distress HENT: normocephalic atraumatic, mucous membranes moist Eyes: conjunctiva non-erythematous Neck: supple Cardiovascular: regular rate and rhythm,  no m/r/g Pulmonary/Chest: normal work of breathing on room air, lungs clear to auscultation bilaterally Abdominal:  BS present, increased suprapubic and bilateral CVA tenderness to palpation. Neurological: alert & oriented x 3, 5/5 strength in bilateral upper and lower extremities, normal gait Skin: warm and dry Psych: pleasant mood and affect       01/30/2022   11:50 AM  Depression screen PHQ 2/9  Decreased Interest 0  Down, Depressed, Hopeless 0  PHQ - 2 Score 0        No data to display           Assessment & Plan:   Acute renal failure with oliguria [facility]) This 77 yo woman presented to our clinic after a week-long history of dysuria with increased suprapubic tenderness that progressed to acute bilateral flank pain over the past week while traveling to [place]. Patient was seen at [facility] yesterday for worsening of suprapubic and flank pain. U/A with dipstick negative for pyuria, hematuria, or proteinuria. She was initially prescribed Tamsulosin and Keflex, however, after U/A and culture results were negative, patient was told to discontinue it.   Patient requested Telehealth [facility] with [facility] with Dr. August Saucer. During her interview, her symptoms were concerning for urinary retention vs nephrolithiasis. Patient declined recommendation to be sent to the Emergency Department and instead presented for an in-person evaluation.  Patient was able to confirm the symptoms above and documented in Dr. Diamantina Providence and UC provider's note. She also reported that she has been making very small amounts of urine for the past week. She denied new medications. Most bothersome is 10/10 lower abdominal pain with bilateral flank pain worse on R>L. Sometimes it feels like the pain starts on  the R flank and radiates to the suprapubic region and vicersa. Continues to have increased dysuria and frequency. She has not changed her level of hydration. Continues to take all her prescribed medications. Reports nausea and vomiting. Besides a one time episode of a loose bowel moment on Monday, patient reports no changes in her bowel habits. Of note, patient has a history of appendectomy.  On exam, there was exquisite tenderness over the suprapubic region and bilateral CVA. Bowel sounds were normal. Bladder scan in office with no urine in the bladder. Patient voided x2 during her visit with <10cc each time, which is about the same volume she has been able to void for the past 72 hours, and has worsened since symptoms started about a week ago.  Her polyuria and dysuria is concerning for UTI vs pyelonephritis vs nephrolithiasis. However, her decreased urine output are concerning for acute renal injury vs failure. I was unable to get a STAT CT scan done today. Attempted to place request for direct admission given her acute, increased pain, oliguria, and concern for AKI. However, bed placement calculated earliest bed available would be on 8/1 in the AM. Given this, patient was sent to the ED from our office.  Prior to departure, we repeated U/A with microscopy positive for hematuria and leukocytes without significant bacteria burden. Her STAT CMP showed Cr of 1.79 from 1.0-1.11 baseline and GFR of 29, confirming diagnosis of AKI. Given symptom onset and progression differential include infectious/AIN vs obstructive vs ATN.  Microscopic analysis still pending by the time this note was redacted. Patient was instructed to return to clinic after being discharged from the ED/hospital evaluation. I discussed results with patient's daughter, who is accompanying Linda [name] in the Emergency Department. She is awaiting a CT scan. We will discuss  with the IMT[facility] on-call resident as it is likely patient will need to be  admitted to the hospital for further evaluation and treatment.   Return if symptoms worsen or fail to improve after ED/hospital discharge.  Patient discussed with Dr. Alfonse Alpers, MD [facility] Internal Medicine Program - PGY-2 10/29/2022, 6:14 PM

## 2022-10-29 NOTE — Progress Notes (Signed)
[facility] Internal Medicine Residency Telephone Encounter Continuity Care Appointment  HPI:  This telephone encounter was created for Linda [name] on 10/29/2022 for the following purpose/cc: dysuria, nausea, vomiting, abdominal pain.   Past Medical History:  Past Medical History:  Diagnosis Date   Arthritis    "qwhere; hands, feet, different body parts from time to time" (05/08/2016)   Asthma    Breast mass 07/03/2016   Cancer (HCC)    right breast   Complication of anesthesia    "they used an adult tube going down my throat; caused alot of problems; was told to always have pediatric tube placed" (05/08/2016)   COPD (chronic obstructive pulmonary disease) (HCC)    COVID-19 03/28/2020   GERD (gastroesophageal reflux disease)    Hearing impairment 03/21/2021   ENT eval and hearing aides provided in past, which were unsatisfactory.  At conversational volume she frequently requests something to be repeated.  She would like a second opinion at a completely different ENT practice but is not quite ready to pursue this.    History of hiatal hernia    History of radiation therapy 10/07/16- 11/17/16   Right Breast and regional nodes 50 Gy in 25 fractions to breast and at least 45 Gy in 25 fractions to nodes, Right Breast boost 10 Gy in 5 fractions.    Hyperglycemia    Hyperlipidemia    Hypertension    Hypothyroidism    Lump of breast, right    "suppose to have it checked soon" (05/08/2016)   Migraine    "none for years" (05/08/2016)   Osteopenia    Personal history of radiation therapy 2018   Pneumonia "several times"   Post-surgical hypothyroidism 05/09/2016   Status post radioactive iodine thyroid ablation    Thyroid disease      ROS:  Negative unless otherwise stated.   Assessment / Plan / Recommendations:  Dysuria Telehealth follow-up visit for patient regarding dysuria, abdominal pain, nausea, and vomiting.  Patient explains that her symptoms started 1 week ago when she  was visiting [place]. She noticed increased urinary urgency with inability to urinate adequately. There was a heat wave where she was so she and her family suspected that she was dehydrated, resulting in her increasing her water and lemonade intake. She also had bilateral side pain but it was not severe.  She returned to [facility] on Monday and was still having difficulty urinating. She had stomach cramps as well and took imodium. On Monday night she had an episode of urination that was red-brownish in color and she went to urgent care yesterday due to concern for UTI.  At urgent care a POC urine dipstick was bland and urine culture collected at that time shows no growth at 1 day. She was discharged from urgent care with keflex and flomax for empiric treatment of possible UTI vs stone. She has taken one dose of flomax but has not taken the keflex.  This morning she woke up around 4 AM with severe abdominal pain, nausea, vomiting, and difficulty passing urine and stool, and pain with urination attempts. Patient explains that right now she is "hurting so bad [she] can't even stand up." Her pain is in her R side, over her flank, with lower abdominal pressure and severe stomach pain with nausea. Her last BM was Monday. She denies fever or chills. She has tolerated PO intake through yesterday but has not tried today. Assessment: [name] UA is less suspicious for acute cystitis or possible kidney  stone but lack of hematuria cannot completely rule that out. I am worried about urinary retention and development of complication such as hydronephrosis. I feel that the patient needs an evaluation in person and imaging such as a bladder scan and possibly CT renal stone study. Plan: Discussed recommendations with patient and she does not want to go to ED; rather, she would prefer to come to [facility]. There is an available appointment at 3:15 today and she will be scheduled for that.  As always, pt is advised that if symptoms  worsen or new symptoms arise, they should go to an urgent care facility or to to ER for further evaluation.   Consent and Medical Decision Making:  Patient discussed with Dr.  Sol Blazing This is a telephone encounter between Linda [name] and Champ Mungo on 10/29/2022 for dysuria, nausea, vomiting, abdominal pain. The visit was conducted with the patient located at home and Champ Mungo at [facility]. The patient's identity was confirmed using their DOB and current address. The patient has consented to being evaluated through a telephone encounter and understands the associated risks (an examination cannot be done and the patient may need to come in for an appointment) / benefits (allows the patient to remain at home, decreasing exposure to coronavirus). I personally spent 20 minutes on medical discussion.

## 2022-10-29 NOTE — Assessment & Plan Note (Addendum)
Telehealth follow-up visit for patient regarding dysuria, abdominal pain, nausea, and vomiting.  Patient explains that her symptoms started 1 week ago when she was visiting [place]. She noticed increased urinary urgency with inability to urinate adequately. There was a heat wave where she was so she and her family suspected that she was dehydrated, resulting in her increasing her water and lemonade intake. She also had bilateral side pain but it was not severe.  She returned to [facility] on Monday and was still having difficulty urinating. She had stomach cramps as well and took imodium. On Monday night she had an episode of urination that was red-brownish in color and she went to urgent care yesterday due to concern for UTI.  At urgent care a POC urine dipstick was bland and urine culture collected at that time shows no growth at 1 day. She was discharged from urgent care with keflex and flomax for empiric treatment of possible UTI vs stone. She has taken one dose of flomax but has not taken the keflex.  This morning she woke up around 4 AM with severe abdominal pain, nausea, vomiting, and difficulty passing urine and stool, and pain with urination attempts. Patient explains that right now she is "hurting so bad [she] can't even stand up." Her pain is in her R side, over her flank, with lower abdominal pressure and severe stomach pain with nausea. Her last BM was Monday. She denies fever or chills. She has tolerated PO intake through yesterday but has not tried today. Assessment: Linda [name] UA is less suspicious for acute cystitis or possible kidney stone but lack of hematuria cannot completely rule that out. I am worried about urinary retention and development of complication such as hydronephrosis. I feel that the patient needs an evaluation in person and imaging such as a bladder scan and possibly CT renal stone study. Plan: Discussed recommendations with patient and she does not want to go to ED; rather,  she would prefer to come to [facility]. There is an available appointment at 3:15 today and she will be scheduled for that.

## 2022-10-29 NOTE — ED Notes (Signed)
Pt just had blood work done in IM and rather not have blood drawn again

## 2022-10-29 NOTE — Patient Instructions (Addendum)
Thank you, Linda [name] for allowing [place] to provide your care today. Today we discussed your recent abdominal pain and decreased urination. We are concerned that your kidneys are not making enough urine.  We need to send you to the emergency department as you need evaluation with imaging tests that we are unfortunately not able to get a direct admission to the hospital or a STAT CT scan of the abdomen.  I have ordered the following labs for you:   Lab Orders         Culture, Urine         Urinalysis, Complete w Microscopic         CMP w [place] (STAT/Sunquest-performed on-site)      I will call if any are abnormal. All of your labs can be accessed through "My Chart".   My Chart Access: [email or web address]  Please follow-up in: if symptoms do not resolve and to follow up after your emergency visit    We look forward to seeing you next time. Please call our clinic at [phone] if you have any questions or concerns. The best time to call is Monday-Friday from 9am-4pm, but there is someone available 24/7. If after hours or the weekend, call the main hospital number and ask for the Internal Medicine Resident On-Call. If you need medication refills, please notify your pharmacy one week in advance and they will send [place] a request.   Thank you for letting us take part in your care. Wishing you the best!  Morene Crocker, MD 10/29/2022, 4:53 PM [facility] Internal Medicine Resident, PGY-1

## 2022-10-29 NOTE — ED Provider Notes (Signed)
[facility] AT [facility] Provider Note   CSN: [id number] Arrival date & time: 10/29/22  1707     History  Chief Complaint  Patient presents with   Abdominal Pain    Linda [name] is a 77 y.o. female.  Patient with past medical history significant for GERD, COPD, breast cancer status post surgery, radiation, BPPV, hypertension presents to the emergency department with concerns over right-sided abdominal/flank pain with hematuria, oliguria, urinary frequency for the past week.  Patient states symptoms began when she was on vacation in [place].  Patient's daughter at bedside reports that temperatures that time were 95+ degrees.  Patient endorses drinking normal amounts of water over the past week.  She states she has had decreased urine output and urine has frequently been dark in nature.  Patient was evaluated with urgent care and at the internal medicine clinic today.  Labs at that time showed acute kidney injury with creatinine of 1.79 (baseline around 1.0).  There were also concerns due to bilateral CVA tenderness the patient may have nephrolithiasis.  Patient was advised to go to the emergency department for imaging.  Patient does endorse vomiting earlier today.  She denies chest pain, shortness of breath, fever, headache, vaginal bleeding or discharge.  HPI     Home Medications Prior to Admission medications   Medication Sig Start Date End Date Taking? Authorizing Provider  oxyCODONE (ROXICODONE) 5 MG immediate release tablet Take 1 tablet (5 mg total) by mouth every 4 (four) hours as needed for severe pain. 10/30/22  Yes Barrie Dunker B, PA-C  amLODipine (NORVASC) 10 MG tablet Take 1 tablet (10 mg total) by mouth daily. 01/30/22 01/25/23  Miguel Aschoff, MD  azithromycin (ZITHROMAX) 250 MG tablet Take 1 tablet (250 mg total) by mouth daily. 12/31/21   Mannam, Colbert Coyer, MD  cephALEXin (KEFLEX) 500 MG capsule Take 1 capsule (500 mg total) by mouth  2 (two) times daily for 5 days. 10/28/22 11/02/22  Valinda Hoar, NP  Cholecalciferol (VITAMIN D3) 25 MCG (1000 UT) CAPS Take by mouth.    [provider]  Fluticasone-Umeclidin-Vilant (TRELEGY ELLIPTA) 100-62.5-25 MCG/ACT AEPB Inhale 1 puff by mouth once daily 06/10/22   Mannam, Colbert Coyer, MD  levothyroxine (SYNTHROID) 88 MCG tablet Take 1 tablet (88 mcg total) by mouth daily. 01/08/22   Miguel Aschoff, MD  losartan (COZAAR) 100 MG tablet Take 1 tablet (100 mg total) by mouth daily. 01/30/22 01/25/23  Miguel Aschoff, MD  meloxicam (MOBIC) 15 MG tablet Take 1 tablet (15 mg total) by mouth daily before breakfast. 07/07/22   Miguel Aschoff, MD  nicotine (NICOTROL) 10 MG inhaler Nicotrol 10 mg inhalation cartridge  USE 1 CARTRIDGE 16 TIMES A DAY AS NEEDED 10/30/20   Miguel Aschoff, MD  omeprazole (PRILOSEC) 20 MG capsule Take 20 mg by mouth daily before breakfast.     [provider]  Omeprazole-Sodium Bicarbonate (ZEGERID) 20-1100 MG CAPS capsule Take 1 capsule by mouth daily before breakfast.    [provider]  ondansetron (ZOFRAN-ODT) 4 MG disintegrating tablet Take 1 tablet (4 mg total) by mouth daily as needed for up to 12 doses for nausea or vomiting. 04/29/22   Miguel Aschoff, MD  pravastatin (PRAVACHOL) 40 MG tablet Take 1 tablet (40 mg total) by mouth daily before breakfast. 09/30/22 09/25/23  Miguel Aschoff, MD  PROAIR HFA 108 (90 Base) MCG/ACT inhaler INHALE 1 TO 2 PUFFS BY MOUTH EVERY 6 HOURS AS NEEDED  FOR WHEEZING FOR SHORTNESS OF BREATH 12/26/20   Chilton Greathouse, MD  Probiotic Product (ULTRAFLORA IMMUNE HEALTH PO) Take by mouth.    [provider]  tamsulosin (FLOMAX) 0.4 MG CAPS capsule Take 1 capsule (0.4 mg total) by mouth daily. 10/28/22   Valinda Hoar, NP      Allergies    Aspirin    Review of Systems   Review of Systems  Physical Exam Updated Vital Signs BP (!) 169/83 (BP Location: Left Arm)   Pulse 77   Temp  97.6 F (36.4 C) (Oral)   Resp 16   Ht 5\' 4"  (1.626 m)   Wt 58.1 kg   SpO2 97%   BMI 21.99 kg/m  Physical Exam Vitals and nursing note reviewed.  Constitutional:      General: She is not in acute distress.    Appearance: She is well-developed.  HENT:     Head: Normocephalic and atraumatic.  Eyes:     Conjunctiva/sclera: Conjunctivae normal.  Cardiovascular:     Rate and Rhythm: Normal rate and regular rhythm.     Heart sounds: No murmur heard. Pulmonary:     Effort: Pulmonary effort is normal. No respiratory distress.     Breath sounds: Normal breath sounds.  Abdominal:     Palpations: Abdomen is soft.     Tenderness: There is abdominal tenderness in the right lower quadrant. There is right CVA tenderness and left CVA tenderness.  Musculoskeletal:        General: No swelling.     Cervical back: Neck supple.  Skin:    General: Skin is warm and dry.     Capillary Refill: Capillary refill takes less than 2 seconds.  Neurological:     Mental Status: She is alert.  Psychiatric:        Mood and Affect: Mood normal.     ED Results / Procedures / Treatments   Labs (all labs ordered are listed, but only abnormal results are displayed) Labs Reviewed  URINALYSIS, ROUTINE W REFLEX MICROSCOPIC - Abnormal; Notable for the following components:      Result Value   Hgb urine dipstick MODERATE (*)    Protein, ur 30 (*)    Leukocytes,Ua TRACE (*)    Bacteria, UA RARE (*)    All other components within normal limits  CBC WITH DIFFERENTIAL/PLATELET - Abnormal; Notable for the following components:   Neutro Abs 7.8 (*)    All other components within normal limits  I-STAT CHEM 8, ED - Abnormal; Notable for the following components:   BUN 29 (*)    Creatinine, Ser 1.90 (*)    Glucose, Bld 106 (*)    All other components within normal limits    EKG None  Radiology CT ABDOMEN PELVIS WO CONTRAST  Result Date: 10/29/2022 CLINICAL DATA:  Flank pain EXAM: CT ABDOMEN AND PELVIS  WITHOUT CONTRAST TECHNIQUE: Multidetector CT imaging of the abdomen and pelvis was performed following the standard protocol without IV contrast. RADIATION DOSE REDUCTION: This exam was performed according to the departmental dose-optimization program which includes automated exposure control, adjustment of the mA and/or kV according to patient size and/or use of iterative reconstruction technique. COMPARISON:  CT 08/01/2016 FINDINGS: Lower chest: Lung bases demonstrate advanced emphysema. Scarring in the right base. Small hiatal hernia Hepatobiliary: Subcapsular liver cysts. No calcified gallstone or biliary dilatation Pancreas: Unremarkable. No pancreatic ductal dilatation or surrounding inflammatory changes. Spleen: Normal in size without focal abnormality. Adrenals/Urinary Tract: Adrenal glands are stable  in appearance. 12 mm left adrenal nodule with density value of 9, consistent with benign adenoma. No imaging follow-up is recommended. Numerous bilateral kidney stones, on the left measuring up to 4 mm in size and on the right measuring up to 5 mm in size. Moderate right-sided hydronephrosis and marked hydroureter, secondary to a 5 mm stone at or just distal to the right UVJ. Stomach/Bowel: Stomach nonenlarged. No dilated small bowel. No acute bowel wall thickening. Large stool burden. Non identified appendix. Vascular/Lymphatic: Moderate aortic atherosclerosis. No aneurysm. No suspicious lymph nodes Reproductive: Status post hysterectomy. No adnexal masses. Other: Negative for pelvic effusion or free air Musculoskeletal: Left hip replacement with artifact. Multilevel degenerative changes of the spine. No acute osseous abnormality IMPRESSION: 1. Moderate right-sided hydronephrosis and marked hydroureter, secondary to a 5 mm stone at or just distal to the right UVJ. 2. Numerous bilateral kidney stones. 3. Large stool burden. Aortic Atherosclerosis (ICD10-I70.0) and Emphysema (ICD10-J43.9). Electronically Signed    By: Jasmine Pang M.D.   On: 10/29/2022 23:50    Procedures Procedures    Medications Ordered in ED Medications  oxyCODONE-acetaminophen (PERCOCET/ROXICET) 5-325 MG per tablet 2 tablet (has no administration in time range)  HYDROcodone-acetaminophen (NORCO/VICODIN) 5-325 MG per tablet 1 tablet (1 tablet Oral Given 10/29/22 1801)  sodium chloride 0.9 % bolus 1,000 mL (0 mLs Intravenous Stopped 10/30/22 0049)  ondansetron (ZOFRAN) injection 4 mg (4 mg Intravenous Given 10/29/22 2356)  fentaNYL (SUBLIMAZE) injection 50 mcg (50 mcg Intravenous Given 10/29/22 2356)    ED Course/ Medical Decision Making/ A&P                                 Medical Decision Making Amount and/or Complexity of Data Reviewed Labs: ordered. Radiology: ordered.  Risk Prescription drug management.   This patient presents to the ED for concern of abdominal pain with decreased urine output, this involves an extensive number of treatment options, and is a complaint that carries with it a high risk of complications and morbidity.  The differential diagnosis includes hydronephrosis, pyelonephritis, nephrolithiasis, cystitis, others   Co morbidities that complicate the patient evaluation  COPD, GERD   Additional history obtained:  Additional history obtained from family at bedside External records from outside source obtained and reviewed including internal medicine notes from earlier today   Lab Tests:  I Ordered, and personally interpreted labs.  The pertinent results include: Creatinine 1.79 on CMP performed at primary care earlier today, UA with moderate hemoglobin, trace leukocytes, protein, rare bacteria; grossly unremarkable CBC   Imaging Studies ordered:  I ordered imaging studies including CT abdomen pelvis without contrast I independently visualized and interpreted imaging which showed  . Moderate right-sided hydronephrosis and marked hydroureter,  secondary to a 5 mm stone at or just distal to  the right UVJ.  2. Numerous bilateral kidney stones.  3. Large stool burden.   I agree with the radiologist interpretation   Problem List / ED Course / Critical interventions / Medication management   I ordered medication including hydrocodone, fentanyl for pain, normal saline for fluid resuscitation, Zofran for nausea Reevaluation of the patient after these medicines showed that the patient improved I have reviewed the patients home medicines and have made adjustments as needed   Test / Admission - Considered:  Patient with 5 mm stone near the right UVJ.  Patient with moderate right-sided hydronephrosis and hydroureter.  Scan results consistent with patient presentation.  Patient with no signs of pyelonephritis at this time.  She was prescribed Flomax and antibiotics for coverage earlier.  Patient would like to discharge home at this time.  With AKI I do not plan on prescribing anti-inflammatories at this time.  Patient follow-up with urology.  Patient will take the prescribed Flomax and will use Zofran as needed for nausea.  I will also prescribe a short course of pain medication.  Return precautions provided.         Final Clinical Impression(s) / ED Diagnoses Final diagnoses:  AKI (acute kidney injury) (HCC)  Hydronephrosis with urinary obstruction due to ureteral calculus    Rx / DC Orders ED Discharge Orders          Ordered    oxyCODONE (ROXICODONE) 5 MG immediate release tablet  Every 4 hours PRN        10/30/22 0100              Darrick Grinder, PA-C 10/30/22 0119    Nira Conn, MD 10/31/22 0710

## 2022-10-29 NOTE — ED Notes (Addendum)
Pt states she was seen my internal medicine today and they requested she come to the ED for a stat CT scan. This NT called CT and no CT has been ordered. Pt requesting the stat CT scan and more pain medications. Triage RN aware.

## 2022-10-30 MED ORDER — OXYCODONE-ACETAMINOPHEN 5-325 MG PO TABS
2.0000 | ORAL_TABLET | Freq: Once | ORAL | Status: AC
  Administered 2022-10-30: 2 via ORAL
  Filled 2022-10-30: qty 2

## 2022-10-30 MED ORDER — OXYCODONE HCL 5 MG PO TABS: 5.0000 mg | ORAL_TABLET | ORAL | 0 refills | Status: DC | PRN

## 2022-10-30 NOTE — Discharge Instructions (Addendum)
You were evaluated tonight for right sided abdominal and flank pain. You have a stone obstructing urine outflow from the right kidney. Please take the prescribed flomax, your Zofran for nausea. Please take 1000mg  acetaminophen every 6 hours. I also prescribed roxicodone for breakthrough pain. If your pain becomes intolerable, you have intractable nausea/vomiting, you become unable to pass urine, or develop other life threats, please return to the emergency department.  I have provided contact information for urology for follow up.  If your symptoms improve and you no longer feel the need for urologic follow up I recommend repeat kidney labs in 1 week to check for resolution of your acute kidney injury.   Due to internet issues at [facility] we were unable to e-prescribe

## 2022-11-03 NOTE — Progress Notes (Signed)
Internal Medicine Clinic Attending  Case discussed with the resident at the time of the visit.  We reviewed the resident's history and exam and pertinent patient test results.  I agree with the assessment, diagnosis, and plan of care documented in the resident's note.  

## 2022-11-03 NOTE — Addendum Note (Signed)
Addended by: Dickie La on: 11/03/2022 10:23 AM   Modules accepted: Level of Service

## 2022-11-03 NOTE — Progress Notes (Signed)
Internal Medicine Clinic Attending  Case discussed with the resident at the time of the visit.  We reviewed the resident's history and exam and pertinent patient test results.  I agree with the assessment, diagnosis, and plan of care documented in the resident's note.  Agree with plan.  Patient appeared unwell and will need workup.  Direct admission was on a 24 hour delay, so ED was recommended.   Debe Coder, MD

## 2022-11-06 ENCOUNTER — Telehealth: Payer: Self-pay

## 2022-11-06 DIAGNOSIS — N201 Calculus of ureter: Secondary | ICD-10-CM | POA: Diagnosis not present

## 2022-11-06 NOTE — Telephone Encounter (Signed)
Transition Care Management Unsuccessful Follow-up Telephone Call  Date of discharge and from where:  [facility]  Attempts:  1st Attempt  Reason for unsuccessful TCM follow-up call:  No answer/busy   Lenard Forth [facility] Guide, [facility] [phone] [address] [place], [place], [place] [address] Phone: [phone] Email: [email or web address]

## 2022-11-06 NOTE — Telephone Encounter (Signed)
Transition Care Management Unsuccessful Follow-up Telephone Call  Date of discharge and from where:  [facility]  Attempts:  2nd Attempt  Reason for unsuccessful TCM follow-up call:  No answer/busy   Lenard Forth [facility] Guide, [facility] [phone] [address] [place], [place], [place] [address] Phone: [phone] Email: [email or web address]

## 2022-11-07 DIAGNOSIS — N132 Hydronephrosis with renal and ureteral calculous obstruction: Secondary | ICD-10-CM | POA: Diagnosis not present

## 2022-11-10 ENCOUNTER — Ambulatory Visit (HOSPITAL_BASED_OUTPATIENT_CLINIC_OR_DEPARTMENT_OTHER)
Admission: RE | Admit: 2022-11-10 | Discharge: 2022-11-10 | Disposition: A | Discharge status: Home / Self Care | Payer: PPO | Source: Ambulatory Visit | Attending: Urology | Admitting: Urology

## 2022-11-10 ENCOUNTER — Other Ambulatory Visit (HOSPITAL_COMMUNITY): Payer: Self-pay | Admitting: Urology

## 2022-11-10 ENCOUNTER — Ambulatory Visit (HOSPITAL_COMMUNITY): Payer: PPO

## 2022-11-10 DIAGNOSIS — D3502 Benign neoplasm of left adrenal gland: Secondary | ICD-10-CM | POA: Diagnosis not present

## 2022-11-10 DIAGNOSIS — N201 Calculus of ureter: Secondary | ICD-10-CM

## 2022-11-10 DIAGNOSIS — K769 Liver disease, unspecified: Secondary | ICD-10-CM | POA: Diagnosis not present

## 2022-11-10 DIAGNOSIS — N132 Hydronephrosis with renal and ureteral calculous obstruction: Secondary | ICD-10-CM

## 2022-11-10 DIAGNOSIS — N200 Calculus of kidney: Secondary | ICD-10-CM | POA: Diagnosis not present

## 2022-11-11 ENCOUNTER — Telehealth: Payer: Self-pay | Admitting: Internal Medicine

## 2022-11-11 NOTE — Telephone Encounter (Signed)
Reached Linda [name] by phone-she is feeling much better after passing the kidney stone, but does have residual aching in the right side, which she was advised is related to swelling in the ureter which will resolve.  She is preparing to go to [place] for her granddaughters wedding.  She is actually more concerned about the report of aortic atherosclerosis on the CT and we agree that she will come in in September to discuss these findings, their implication, and any action which would be recommended.  I will message the front desk to help get her arranged for appointment.

## 2022-11-11 NOTE — Telephone Encounter (Signed)
Called to check on patient's wellbeing, being notified through EMR that she was seen in the ED recently for obstructing right ureteral stone associated with hydronephrosis.  She underwent follow-up CT yesterday which shows interval passage of the stone and resolution of the hydro.  No answer or ability to leave message.

## 2022-12-03 ENCOUNTER — Ambulatory Visit (INDEPENDENT_AMBULATORY_CARE_PROVIDER_SITE_OTHER): Payer: PPO

## 2022-12-03 VITALS — Ht 64.0 in | Wt 128.0 lb

## 2022-12-03 DIAGNOSIS — Z0000 Encounter for general adult medical examination without abnormal findings: Secondary | ICD-10-CM | POA: Diagnosis not present

## 2022-12-03 NOTE — Patient Instructions (Addendum)
Linda [name] , Thank you for taking time to come for your Medicare Wellness Visit. I appreciate your ongoing commitment to your health goals. Please review the following plan we discussed and let me know if I can assist you in the future.   Referrals/Orders/Follow-Ups/Clinician Recommendations: You are due for a Tetanus vaccine and can get during your next office visit or at your pharmacy.  Remember to discuss this with your PCP, along with asking about the order that was placed for a Lung cancer screening last year.  It was nice talking with you today.  Each day, aim for 6 glasses of water, plenty of protein in your diet and try to get up and walk/ stretch every hour for 5-10 minutes at a time.    This is a list of the screening recommended for you and due dates:  Health Maintenance  Topic Date Due   Hepatitis C Screening  Never done   DTaP/Tdap/Td vaccine (2 - Tdap) 12/25/2020   Flu Shot  10/30/2022   COVID-19 Vaccine (4 - 2023-24 season) 11/30/2022   Screening for Lung Cancer  02/15/2023   Medicare Annual Wellness Visit  12/03/2023   Pneumonia Vaccine  Completed   DEXA scan (bone density measurement)  Completed   HPV Vaccine  Aged Out   Zoster (Shingles) Vaccine  Discontinued    Advanced directives: (In Chart) A copy of your advanced directives are scanned into your chart should your provider ever need it.  Next Medicare Annual Wellness Visit scheduled for next year: Yes

## 2022-12-03 NOTE — Progress Notes (Signed)
Subjective:   Linda [name] is a 77 y.o. female who presents for Medicare Annual (Subsequent) preventive examination.  Visit Complete: Virtual  I connected with  Ramon Dredge on 12/03/22 by a audio enabled telemedicine application and verified that I am speaking with the correct person using two identifiers.  Patient Location: Home  Provider Location: Home Office  I discussed the limitations of evaluation and management by telemedicine. The patient expressed understanding and agreed to proceed.  Patient Medicare AWV questionnaire was completed by the patient on 11/28/2022; I have confirmed that all information answered by patient is correct and no changes since this date.  Vital Signs: Because this visit was a virtual/telehealth visit, some criteria may be missing or patient reported. Any vitals not documented were not able to be obtained and vitals that have been documented are patient reported.    Review of Systems    Cardiac Risk Factors include: advanced age (>67men, >3 women);hypertension;Other (see comment), Risk factor comments: COPD, Osteoporosis     Objective:    Today's Vitals   12/03/22 1036  Weight: 128 lb (58.1 kg)  Height: 5\' 4"  (1.626 m)   Body mass index is 21.97 kg/m.     12/03/2022   10:44 AM 10/29/2022    3:32 PM 01/30/2022   10:54 AM 08/29/2021   11:32 AM 03/07/2021   12:14 PM 11/15/2020   10:39 AM 10/17/2020    3:13 PM  Advanced Directives  Does Patient Have a Medical Advance Directive? Yes Yes No No No No No  Type of Estate agent of [place];Living will Living will       Does patient want to make changes to medical advance directive? No - Patient declined No - Patient declined       Copy of Healthcare Power of Attorney in Chart? Yes - validated most recent copy scanned in chart (See row information)        Would patient like information on creating a medical advance directive?    No - Patient declined No - Patient  declined No - Patient declined Yes (MAU/Ambulatory/Procedural Areas - Information given)    Current Medications (verified) Outpatient Encounter Medications as of 12/03/2022  Medication Sig   amLODipine (NORVASC) 10 MG tablet Take 1 tablet (10 mg total) by mouth daily.   azithromycin (ZITHROMAX) 250 MG tablet Take 1 tablet (250 mg total) by mouth daily.   Cholecalciferol (VITAMIN D3) 25 MCG (1000 UT) CAPS Take by mouth.   Fluticasone-Umeclidin-Vilant (TRELEGY ELLIPTA) 100-62.5-25 MCG/ACT AEPB Inhale 1 puff by mouth once daily   levothyroxine (SYNTHROID) 88 MCG tablet Take 1 tablet (88 mcg total) by mouth daily.   losartan (COZAAR) 100 MG tablet Take 1 tablet (100 mg total) by mouth daily.   meloxicam (MOBIC) 15 MG tablet Take 1 tablet (15 mg total) by mouth daily before breakfast.   nicotine (NICOTROL) 10 MG inhaler Nicotrol 10 mg inhalation cartridge  USE 1 CARTRIDGE 16 TIMES A DAY AS NEEDED   omeprazole (PRILOSEC) 20 MG capsule Take 20 mg by mouth daily before breakfast.    Omeprazole-Sodium Bicarbonate (ZEGERID) 20-1100 MG CAPS capsule Take 1 capsule by mouth daily before breakfast.   ondansetron (ZOFRAN-ODT) 4 MG disintegrating tablet Take 1 tablet (4 mg total) by mouth daily as needed for up to 12 doses for nausea or vomiting.   pravastatin (PRAVACHOL) 40 MG tablet Take 1 tablet (40 mg total) by mouth daily before breakfast.   PROAIR HFA 108 (90 Base) MCG/ACT  inhaler INHALE 1 TO 2 PUFFS BY MOUTH EVERY 6 HOURS AS NEEDED FOR WHEEZING FOR SHORTNESS OF BREATH   Probiotic Product (ULTRAFLORA IMMUNE HEALTH PO) Take by mouth.   oxyCODONE (ROXICODONE) 5 MG immediate release tablet Take 1 tablet (5 mg total) by mouth every 4 (four) hours as needed for severe pain. (Patient not taking: Reported on 12/03/2022)   tamsulosin (FLOMAX) 0.4 MG CAPS capsule Take 1 capsule (0.4 mg total) by mouth daily. (Patient not taking: Reported on 12/03/2022)   No facility-administered encounter medications on file as of  12/03/2022.    Allergies (verified) Aspirin   History: Past Medical History:  Diagnosis Date   Arthritis    "qwhere; hands, feet, different body parts from time to time" (05/08/2016)   Asthma    Breast mass 07/03/2016   Cancer (HCC)    right breast   Complication of anesthesia    "they used an adult tube going down my throat; caused alot of problems; was told to always have pediatric tube placed" (05/08/2016)   COPD (chronic obstructive pulmonary disease) (HCC)    COVID-19 03/28/2020   GERD (gastroesophageal reflux disease)    Hearing impairment 03/21/2021   ENT eval and hearing aides provided in past, which were unsatisfactory.  At conversational volume she frequently requests something to be repeated.  She would like a second opinion at a completely different ENT practice but is not quite ready to pursue this.    History of hiatal hernia    History of radiation therapy 10/07/16- 11/17/16   Right Breast and regional nodes 50 Gy in 25 fractions to breast and at least 45 Gy in 25 fractions to nodes, Right Breast boost 10 Gy in 5 fractions.    Hyperglycemia    Hyperlipidemia    Hypertension    Hypothyroidism    Lump of breast, right    "suppose to have it checked soon" (05/08/2016)   Migraine    "none for years" (05/08/2016)   Osteopenia    Personal history of radiation therapy 2018   Pneumonia "several times"   Post-surgical hypothyroidism 05/09/2016   Status post radioactive iodine thyroid ablation    Thyroid disease    Past Surgical History:  Procedure Laterality Date   ANKLE SURGERY Right X 2   fibrocystic tumors come up in my joints   APPENDECTOMY  1963   BREAST BIOPSY Right 07/09/2016   malignant   BREAST LUMPECTOMY Right 08/14/2016   BREAST LUMPECTOMY WITH AXILLARY LYMPH NODE DISSECTION Right 08/14/2016   Procedure: RIGHT BREAST LUMPECTOMY WITH AXILLARY LYMPH NODE DISSECTION;  Surgeon: Almond Lint, MD;  Location: [facility] OR;  Service: General;  Laterality: Right;   DILATION AND  CURETTAGE OF UTERUS  "several"   "when I was young"   INGUINAL HERNIA REPAIR Bilateral 2000   MASS EXCISION Right 08/14/2016   Procedure: EXCISION RIGHT ARM MASS 2X3CM;  Surgeon: Almond Lint, MD;  Location: [facility] OR;  Service: General;  Laterality: Right;   POLYPECTOMY     "vocal cord"   TONSILLECTOMY     TOTAL HIP ARTHROPLASTY Left 05/09/2016   Procedure: TOTAL HIP ARTHROPLASTY ANTERIOR APPROACH;  Surgeon: Durene Romans, MD;  Location: [facility] OR;  Service: Orthopedics;  Laterality: Left;   TUBAL LIGATION     VAGINAL HYSTERECTOMY  1979   Family History  Problem Relation Age of Onset   Hypertension Mother    Stroke Mother    Heart attack Mother    Allergies Mother    Hypertension Father  Lung cancer Father    Diabetes Sister    Heart disease Sister    Lupus Sister    Allergies Daughter    Breast cancer Neg Hx    Social History   Socioeconomic History   Marital status: Divorced    Spouse name: Not on file   Number of children: 3   Years of education: Not on file   Highest education level: Not on file  Occupational History   Occupation: retired  Tobacco Use   Smoking status: Former    Current packs/day: 0.00    Average packs/day: 0.8 packs/day for 38.0 years (28.5 ttl pk-yrs)    Types: Cigarettes    Start date: 05/01/1980    Quit date: 05/01/2018    Years since quitting: 4.5   Smokeless tobacco: Never   Tobacco comments:    Using Nicotrol Inhaler from primary care  Vaping Use   Vaping status: Never Used  Substance and Sexual Activity   Alcohol use: Yes    Comment: Occasionally   Drug use: No   Sexual activity: Not Currently  Other Topics Concern   Not on file  Social History Narrative   Current Social History 10/17/2020        Patient lives alone in a home which is 1 story. There are not steps up to the entrance the patient uses.       Patient's method of transportation is personal car.      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      The  patient currently retired.      Identified important Relationships are Daughters       Pets : None, pet recently passed away       Interests / Fun: travel, [place] traveled [place]       Current Stressors: none       Religious / Personal Beliefs: "I believe in [place]"       Other: N/A        Social Determinants of Health   Financial Resource Strain: Not on file  Food Insecurity: Not on file  Transportation Needs: Not on file  Physical Activity: Not on file  Stress: Not on file  Social Connections: Not on file    Tobacco Counseling Counseling given: Not Answered Tobacco comments: Using Nicotrol Inhaler from primary care   Clinical Intake:  Pre-visit preparation completed: Yes  Pain : No/denies pain     BMI - recorded: 21.97 Nutritional Status: BMI of 19-24  Normal Nutritional Risks: None Diabetes: No  How often do you need to have someone help you when you read instructions, pamphlets, or other written materials from your doctor or pharmacy?: (P) 2 - Rarely  Interpreter Needed?: No  Information entered by :: Lanice Folden, RMA   Activities of Daily Living    11/28/2022    8:34 PM 01/30/2022   10:59 AM  In your present state of health, do you have any difficulty performing the following activities:  Hearing? 0 1  Vision? 0 0  Difficulty concentrating or making decisions? 0 0  Walking or climbing stairs? 1 0  Dressing or bathing? 0 0  Doing errands, shopping? 0 0  Preparing Food and eating ? N   Using the Toilet? N   In the past six months, have you accidently leaked urine? N   Do you have problems with loss of bowel control? N   Managing your Medications? N   Managing your Finances? N  Housekeeping or managing your Housekeeping? N     Patient Care Team: Miguel Aschoff, MD as PCP - General (Internal Medicine) Leslye Peer, MD (Pulmonary Disease) Almond Lint, MD as Consulting Physician (Surgical Oncology) Lonie Peak, MD as  Attending Physician (Radiation Oncology) Serena Croissant, MD as Consulting Physician (Hematology and Oncology) Axel Filler, Larna Daughters, NP as Nurse Practitioner (Hematology and Oncology) Delfin Gant, MD as Attending Physician (Family Medicine)  Indicate any recent Medical Services you may have received from other than [facility] providers in the past year (date may be approximate).     Assessment:   This is a routine wellness examination for Nevah.  Hearing/Vision screen Hearing Screening - Comments:: Can not hear R sound.  Has some issues with low tone Vision Screening - Comments:: Denies vision issues.  Uses reading glasses.  Dietary issues and exercise activities discussed:     Goals Addressed             This Visit's Progress    Patient Stated   On track    To continue living a healthy lifestyle      Depression Screen    01/30/2022   11:50 AM 08/29/2021   11:32 AM 03/07/2021   12:15 PM 11/15/2020   10:44 AM 10/17/2020    3:02 PM 07/26/2020    1:08 PM 06/05/2020   10:38 AM  PHQ 2/9 Scores  PHQ - 2 Score 0 0 0 0 0 0 0  PHQ- 9 Score   0 0 0  0    Fall Risk    11/28/2022    8:34 PM 01/30/2022   10:58 AM 08/29/2021   11:31 AM 03/07/2021   11:14 AM 11/15/2020   10:43 AM  Fall Risk   Falls in the past year? 0 0 0 0 0  Number falls in past yr: 0 0 0 0 0  Injury with Fall? 0 0 0 0 0  Follow up Falls evaluation completed;Falls prevention discussed Falls evaluation completed Falls evaluation completed Falls evaluation completed     MEDICARE RISK AT HOME: Medicare Risk at Home Any stairs in or around the home?: No If so, are there any without handrails?: No Home free of loose throw rugs in walkways, pet beds, electrical cords, etc?: Yes Adequate lighting in your home to reduce risk of falls?: Yes Life alert?: No Use of a cane, walker or w/c?: No Grab bars in the bathroom?: Yes Shower chair or bench in shower?: Yes Elevated toilet seat or a handicapped toilet?: No  TIMED  UP AND GO:  Was the test performed?  No    Cognitive Function:        12/03/2022   10:45 AM  6CIT Screen  What Year? 0 points  What month? 0 points    Immunizations Immunization History  Administered Date(s) Administered   Fluad Quad(high Dose 65+) 01/02/2020, 12/26/2020   Influenza, High Dose Seasonal PF 01/12/2014, 01/03/2016, 12/16/2016, 12/12/2017, 12/24/2018   Influenza,inj,Quad PF,6+ Mos 12/30/2014   PFIZER(Purple Top)SARS-COV-2 Vaccination 04/23/2019, 05/15/2019, 12/23/2019   Pneumococcal Conjugate-13 12/26/2010, 01/12/2014   Pneumococcal Polysaccharide-23 04/01/2011   Td 12/26/2010    TDAP status: Due, Education has been provided regarding the importance of this vaccine. Advised may receive this vaccine at local pharmacy or Health Dept. Aware to provide a copy of the vaccination record if obtained from local pharmacy or Health Dept. Verbalized acceptance and understanding.  Flu Vaccine status: Due, Education has been provided regarding the importance of this vaccine. Advised may  receive this vaccine at local pharmacy or Health Dept. Aware to provide a copy of the vaccination record if obtained from local pharmacy or Health Dept. Verbalized acceptance and understanding.  Pneumococcal vaccine status: Up to date  Covid-19 vaccine status: Declined, Education has been provided regarding the importance of this vaccine but patient still declined. Advised may receive this vaccine at local pharmacy or Health Dept.or vaccine clinic. Aware to provide a copy of the vaccination record if obtained from local pharmacy or Health Dept. Verbalized acceptance and understanding.  Qualifies for Shingles Vaccine? Yes   Zostavax completed Yes   Shingrix Completed?: Yes  Screening Tests Health Maintenance  Topic Date Due   Hepatitis C Screening  Never done   DTaP/Tdap/Td (2 - Tdap) 12/25/2020   INFLUENZA VACCINE  10/30/2022   COVID-19 Vaccine (4 - 2023-24 season) 11/30/2022   Lung Cancer  Screening  02/15/2023   Medicare Annual Wellness (AWV)  12/03/2023   Pneumonia Vaccine 36+ Years old  Completed   DEXA SCAN  Completed   HPV VACCINES  Aged Out   Zoster Vaccines- Shingrix  Discontinued    Health Maintenance  Health Maintenance Due  Topic Date Due   Hepatitis C Screening  Never done   DTaP/Tdap/Td (2 - Tdap) 12/25/2020   INFLUENZA VACCINE  10/30/2022   COVID-19 Vaccine (4 - 2023-24 season) 11/30/2022    Colorectal cancer screening: No longer required.   Mammogram status: Completed 09/18/2022. Repeat every year  Bone Density status: Completed 11/01/2020. Results reflect: Bone density results: OSTEOPOROSIS. Repeat every 2 years.  Lung Cancer Screening: (Low Dose CT Chest recommended if Age 31-80 years, 20 pack-year currently smoking OR have quit w/in 15years.) does qualify.   Lung Cancer Screening Referral: Has an appointment with Pulmonary 12/2022.  Additional Screening:  Hepatitis C Screening: does qualify; Completed N/A  Vision Screening: Recommended annual ophthalmology exams for early detection of glaucoma and other disorders of the eye. Is the patient up to date with their annual eye exam?  No  Who is the provider or what is the name of the office in which the patient attends annual eye exams? [facility] on [address] If pt is not established with a provider, would they like to be referred to a provider to establish care? No .   Dental Screening: Recommended annual dental exams for proper oral hygiene   Community Resource Referral / Chronic Care Management: CRR required this visit?  No   CCM required this visit?  No     Plan:     I have personally reviewed and noted the following in the patient's chart:   Medical and social history Use of alcohol, tobacco or illicit drugs  Current medications and supplements including opioid prescriptions. Patient is not currently taking opioid prescriptions. Functional ability and status Nutritional  status Physical activity Advanced directives List of other physicians Hospitalizations, surgeries, and ER visits in previous 12 months Vitals Screenings to include cognitive, depression, and falls Referrals and appointments  In addition, I have reviewed and discussed with patient certain preventive protocols, quality metrics, and best practice recommendations. A written personalized care plan for preventive services as well as general preventive health recommendations were provided to patient.     Matilynn Dacey L Berdia Lachman, CMA   12/03/2022   After Visit Summary: (MyChart) Due to this being a telephonic visit, the after visit summary with patients personalized plan was offered to patient via MyChart   Nurse Notes: Patient is due for a Tdap vaccine, which she would  like to discuss with her PCP.  She is also due for a Hep C screening and a DEXA, last one performed was in 2022.   Patient is concerned about the lung cancer screening, she has an appointment with Pulmonary in November.  She is wondering if she would still have to use the order for CT lung screening that was placed on 02/17/2022, or not.  Patient had no other concerns to discuss today.

## 2022-12-04 ENCOUNTER — Other Ambulatory Visit: Payer: Self-pay | Admitting: Pulmonary Disease

## 2022-12-04 ENCOUNTER — Ambulatory Visit (INDEPENDENT_AMBULATORY_CARE_PROVIDER_SITE_OTHER): Payer: PPO | Admitting: Internal Medicine

## 2022-12-04 VITALS — BP 139/69 | HR 83 | Temp 97.8°F | Ht 64.0 in | Wt 126.3 lb

## 2022-12-04 DIAGNOSIS — Z87891 Personal history of nicotine dependence: Secondary | ICD-10-CM | POA: Diagnosis not present

## 2022-12-04 DIAGNOSIS — I700 Atherosclerosis of aorta: Secondary | ICD-10-CM | POA: Diagnosis not present

## 2022-12-04 DIAGNOSIS — I10 Essential (primary) hypertension: Secondary | ICD-10-CM

## 2022-12-04 DIAGNOSIS — M810 Age-related osteoporosis without current pathological fracture: Secondary | ICD-10-CM | POA: Diagnosis not present

## 2022-12-04 MED ORDER — ASPIRIN 81 MG PO TBEC
81.0000 mg | DELAYED_RELEASE_TABLET | Freq: Every day | ORAL | Status: AC
Start: 2022-12-04 — End: 2023-12-04

## 2022-12-04 MED ORDER — OLMESARTAN MEDOXOMIL 40 MG PO TABS
40.0000 mg | ORAL_TABLET | Freq: Every day | ORAL | 11 refills | Status: DC
Start: 2022-12-04 — End: 2023-11-23

## 2022-12-04 NOTE — Assessment & Plan Note (Signed)
In order to gain more consistent control of blood pressure, will change losartan to olmesartan 40 mg daily.  Continue amlodipine 10 mg daily.  Addition of a thiazide and a combo pill could be entertained if needed in the future.

## 2022-12-04 NOTE — Assessment & Plan Note (Signed)
Did not tolerate bisphosphonate.  We did not discuss further today.

## 2022-12-04 NOTE — Assessment & Plan Note (Signed)
Radiographic findings reviewed and discussed.  She is asymptomatic.  We talked about interventions which may reduce risk of progression, and will focus on tighter blood pressure control (she was 139/69 on recheck, much better than at arrival) as her SBP's typically are above goal; will discuss increasing statin to a density at a future; and will begin a low-dose enteric-coated aspirin daily.  With insufficient data to guide [place] for aspirin in radiographic/asymptomatic CVD, we chose to do this with shared decision making as long as it is tolerated.

## 2022-12-04 NOTE — Progress Notes (Signed)
Linda [name] is here for routine evaluation.  Since our last visit, she underwent the unfortunate experience of passing an obstructive kidney stone which had caused hydronephrosis, initially managed in the ED.  She is finally starting to feel better, and had a wonderful trip to [place] with her family for her granddaughters wedding.  The CT done in the ED revealed incidental calcifications of the aorta.  This was concerning for her and we discussed by phone briefly, with a plan to continue our conversation today.  In the meantime, she continues to be very frustrated by her physical limitations (endurance limited by breathing; mobility limited by chronic ankle pain) despite her desire to remain as physically active as she was prior to developing these conditions.  She is a breast cancer survivor and carries chronic conditions of hypertension, hyperlipidemia, and osteoporosis.  She experiences the conflicting emotions of wanting to live as long and as healthy as possible, but wishing to do so without the use of medicines or interventions when possible. This is a very understandable feeling.  With regard to the aortic calcifications, we discussed their implications and whether or not the progression of atherosclerosis can be mitigated in any way.  See assessment and plan for further details.  She is from a large family, many of whom have hypertension.  One of her closest sisters has recently undergone evaluation for heart disease.  Linda [name] herself has had no angina (or claudication) and has no history of CAD, though this is certainly on her mind.  Objective BP 139/69 (BP Location: Left Arm, Patient Position: Sitting, Cuff Size: Small)   Pulse 83   Temp 97.8 F (36.6 C) (Oral)   Ht 5\' 4"  (1.626 m)   Wt 126 lb 4.8 oz (57.3 kg)   SpO2 100%   BMI 21.68 kg/m  Youthful, well appearance.  Minimal dyspnea during conversation and no increased work of breathing.  Animated and somewhat restless, which we joked  about (she's not one to sit still).  Assessment and plan: Abdominal aortic atherosclerosis (HCC) Radiographic findings reviewed and discussed.  She is asymptomatic.  We talked about interventions which may reduce risk of progression, and will focus on tighter blood pressure control (she was 139/69 on recheck, much better than at arrival) as her SBP's typically are above goal; will discuss increasing statin to a density at a future visit; and will begin a low-dose enteric-coated aspirin daily.  With insufficient data to guide [place] for aspirin in radiographic/asymptomatic CVD, we chose to do this with shared decision making as long as it is tolerated.  Hypertension In order to gain more consistent control of blood pressure, will change losartan to olmesartan 40 mg daily.  Continue amlodipine 10 mg daily.  Addition of a thiazide and a combo pill could be entertained if needed in the future. Will recheck renal function at next visit for assessment of new medicine and to f/u her AKI during kidney stone event.  She will check her BP at home and call with results.    Osteoporosis Did not tolerate bisphosphonate.  We did not discuss further today.  Recheck 37M.

## 2022-12-04 NOTE — Patient Instructions (Signed)
Med changes today;  Stop losartan  Begin olmesartan 40  mg one tab daily for hypertension.  Please check your Bps.  Goal top number is less than 140 most of the time.  Begin baby aspirin 81 mg one tab daily.  We'll discuss cholesterol med changes at next visit if the above goes well.  Take care.  Be easy on yourself.  :)  Dr. Mayford Knife

## 2022-12-10 ENCOUNTER — Telehealth: Payer: Self-pay | Admitting: Pulmonary Disease

## 2022-12-10 MED ORDER — TRELEGY ELLIPTA 100-62.5-25 MCG/ACT IN AEPB: 1.0000 | INHALATION_SPRAY | Freq: Every day | RESPIRATORY_TRACT | 1 refills | Status: DC

## 2022-12-10 NOTE — Telephone Encounter (Signed)
Patient states needs refill for Trelegy. Pharmacy is [facility]. Patient phone number is [phone].

## 2022-12-10 NOTE — Telephone Encounter (Signed)
Patient notified

## 2022-12-11 ENCOUNTER — Other Ambulatory Visit: Payer: Self-pay

## 2022-12-11 DIAGNOSIS — I10 Essential (primary) hypertension: Secondary | ICD-10-CM

## 2022-12-11 MED ORDER — AMLODIPINE BESYLATE 10 MG PO TABS
10.0000 mg | ORAL_TABLET | Freq: Every day | ORAL | 3 refills | Status: DC
Start: 2022-12-11 — End: 2023-12-29

## 2023-01-05 ENCOUNTER — Other Ambulatory Visit: Payer: Self-pay

## 2023-01-05 DIAGNOSIS — E890 Postprocedural hypothyroidism: Secondary | ICD-10-CM

## 2023-01-05 MED ORDER — LEVOTHYROXINE SODIUM 88 MCG PO TABS
88.0000 ug | ORAL_TABLET | Freq: Every day | ORAL | 3 refills | Status: DC
Start: 2023-01-05 — End: 2023-03-17

## 2023-01-30 ENCOUNTER — Other Ambulatory Visit: Payer: Self-pay | Admitting: Pulmonary Disease

## 2023-01-30 DIAGNOSIS — J449 Chronic obstructive pulmonary disease, unspecified: Secondary | ICD-10-CM

## 2023-02-01 ENCOUNTER — Other Ambulatory Visit: Payer: Self-pay | Admitting: Pulmonary Disease

## 2023-02-03 ENCOUNTER — Encounter: Payer: Self-pay | Admitting: Internal Medicine

## 2023-02-05 ENCOUNTER — Telehealth: Payer: Self-pay | Admitting: Pulmonary Disease

## 2023-02-05 DIAGNOSIS — J449 Chronic obstructive pulmonary disease, unspecified: Secondary | ICD-10-CM

## 2023-02-05 MED ORDER — TRELEGY ELLIPTA 100-62.5-25 MCG/ACT IN AEPB: 1.0000 | INHALATION_SPRAY | Freq: Every day | RESPIRATORY_TRACT | 0 refills | Status: DC

## 2023-02-05 MED ORDER — AZITHROMYCIN 250 MG PO TABS
250.0000 mg | ORAL_TABLET | Freq: Every day | ORAL | 0 refills | Status: DC
Start: 2023-02-05 — End: 2023-02-10

## 2023-02-05 NOTE — Telephone Encounter (Signed)
Azithromycin and trelegy has been sent to preferred pharmacy. Patient is aware to keep scheduled visit for 02/10/2023 for further refills.  Nothing further needed.

## 2023-02-05 NOTE — Telephone Encounter (Signed)
PT states we have not responded to [facility] on [address] request for Trellegy & Azthromyacin 250 mg fax. Please call in ASAP.

## 2023-02-10 ENCOUNTER — Encounter: Payer: Self-pay | Admitting: Pulmonary Disease

## 2023-02-10 ENCOUNTER — Ambulatory Visit: Payer: PPO | Admitting: Pulmonary Disease

## 2023-02-10 VITALS — BP 122/80 | HR 79 | Ht 64.0 in | Wt 124.8 lb

## 2023-02-10 DIAGNOSIS — Z87891 Personal history of nicotine dependence: Secondary | ICD-10-CM

## 2023-02-10 DIAGNOSIS — J449 Chronic obstructive pulmonary disease, unspecified: Secondary | ICD-10-CM

## 2023-02-10 MED ORDER — ALBUTEROL SULFATE HFA 108 (90 BASE) MCG/ACT IN AERS: 2.0000 | INHALATION_SPRAY | Freq: Four times a day (QID) | RESPIRATORY_TRACT | 2 refills | Status: AC | PRN

## 2023-02-10 MED ORDER — AZITHROMYCIN 250 MG PO TABS: ORAL_TABLET | ORAL | 0 refills | Status: DC

## 2023-02-10 MED ORDER — AZITHROMYCIN 250 MG PO TABS: 250.0000 mg | ORAL_TABLET | ORAL | 3 refills | Status: DC

## 2023-02-10 NOTE — Progress Notes (Signed)
Linda [name]    [id number]    11/21/1945  Primary Care Physician:Williams, Dorene Ar, MD  Referring Physician: Miguel Aschoff, MD [address] [place],  [place] [address]  Chief complaint:   Follow up for  COPD GOLD D  HPI: Linda [name] is a 77 year-old with COPD (GOLD D, CAT score 25, multiple exacerbation over past year, FEV1 41%). Maintained on Spiriva and Symbicort. His symptoms have been stable until this year when she has several exacerbations over the winter requiring courses of antibiotics and steroids. Her primary care physician has referred her back to [place] for further management. Her chief complaint is dyspnea on exertion, loss of energy, fatigue,  chronic cough with sputum production.   She's had a diagnosis of breast cancer in April 2018 and underwent lumpectomy and XRT. She declined chemotherapy. Stopped smoking in January 2020.  She is admitted with recurrent exacerbations in 2020 requiring multiple courses of antibiotics and steroids Started on daily azithromycin in July 2020 and nebulizer therapy  Interim History Discussed the use of AI scribe software for clinical note transcription with the patient, who gave verbal consent to proceed.  The patient, with a history of COPD, presents with intermittent breathing difficulties. She reports that the severity of her symptoms is dependent on her level of physical activity, with increased exertion leading to increased use of her ProAir inhaler. She notes that she has been using an expired inhaler as she is out of her current prescription. She also mentions that she has been unable to exercise due to an ankle injury, which she believes may be contributing to the worsening of her symptoms.  In addition to her respiratory concerns, the patient mentions that she had two CT scans in July and August due to a kidney stone. She expresses concern about the cost of another CT scan, which is due in November for her  annual low-dose screening.  The patient is also on azithromycin daily for her COPD and Trelegy. She reports that she takes an antibiotic on the days she visits the dentist due to a previous hip fracture. She expresses dissatisfaction with the aging process and the associated medical issues.      Outpatient Encounter Medications as of 02/10/2023  Medication Sig   amLODipine (NORVASC) 10 MG tablet Take 1 tablet (10 mg total) by mouth daily.   aspirin EC 81 MG tablet Take 1 tablet (81 mg total) by mouth daily. Swallow whole.   azithromycin (ZITHROMAX) 250 MG tablet Take 1 tablet (250 mg total) by mouth daily.   Cholecalciferol (VITAMIN D3) 25 MCG (1000 UT) CAPS Take by mouth.   Fluticasone-Umeclidin-Vilant (TRELEGY ELLIPTA) 100-62.5-25 MCG/ACT AEPB Inhale 1 puff into the lungs daily. Inhale 1 puff by mouth once daily   levothyroxine (SYNTHROID) 88 MCG tablet Take 1 tablet (88 mcg total) by mouth daily.   nicotine (NICOTROL) 10 MG inhaler Nicotrol 10 mg inhalation cartridge  USE 1 CARTRIDGE 16 TIMES A DAY AS NEEDED   olmesartan (BENICAR) 40 MG tablet Take 1 tablet (40 mg total) by mouth daily.   omeprazole (PRILOSEC) 20 MG capsule Take 20 mg by mouth daily before breakfast.    Omeprazole-Sodium Bicarbonate (ZEGERID) 20-1100 MG CAPS capsule Take 1 capsule by mouth daily before breakfast.   pravastatin (PRAVACHOL) 40 MG tablet Take 1 tablet (40 mg total) by mouth daily before breakfast.   PROAIR HFA 108 (90 Base) MCG/ACT inhaler INHALE 1 TO 2 PUFFS BY  MOUTH EVERY 6 HOURS AS NEEDED FOR WHEEZING FOR SHORTNESS OF BREATH   Probiotic Product (ULTRAFLORA IMMUNE HEALTH PO) Take by mouth.   No facility-administered encounter medications on file as of 02/10/2023.   Physical Exam: Blood pressure 122/80, pulse 79, height 5\' 4"  (1.626 m), weight 124 lb 12.8 oz (56.6 kg), SpO2 97%. Gen:      No acute distress HEENT:  EOMI, sclera anicteric Neck:     No masses; no thyromegaly Lungs:    Clear to auscultation  bilaterally; normal respiratory effort CV:         Regular rate and rhythm; no murmurs Abd:      + bowel sounds; soft, non-tender; no palpable masses, no distension Ext:    No edema; adequate peripheral perfusion Skin:      Warm and dry; no rash Neuro: alert and oriented x 3 Psych: normal mood and affect   Data Reviewed: Imaging CT chest 02/26/15- mild left lower lobe atelectasis, moderate emphysematous changes.  CT chest 08/01/16- moderate emphysematous changes.  No suspicious lung nodules or mass.  Chronic scarring in the right lower lobe. Chest x-ray 08/10/2019-emphysema, no acute cardiopulmonary abnormality. Chest x-ray 12/26/2020-emphysematous changes with no acute cardiopulmonary abnormality. CT abdomen pelvis 10/29/2022-possible scarring of the right base CT renal 11/10/2022-emphysema with right base linear scarring/atelectasis I have reviewed the images personally  PFTs  02/18/11 FVC 2.42 [117%), FEV1 1.46 (69%), F/F 46, TLC 112%, DLCO 69% Moderate obstructive defect, mild reduction in diffusion capacity.   08/23/15 FVC 2.14 [71%], FEV1 0.94 [41%], F/F 44, TLC 114%, DLCO 57% Severe obstructive defect with moderate reduction in diffusion capacity  Labs: Alpha-1 antitrypsin 10/05/18-205, PI MM  Assessment:   Severe COPD. GOLD stage D Continue trelegy inhaler Unable to afford Daliresp Continue azithromycin to 250 mg daily for anti-inflammatory effect.  She has seen an ENT doctor and [facility] last year. I recommended follow-up for audiology testing but she wants to hold off for now. Frequency of exacerbations have reduced since she was started on chronic azithromycin  Variable symptoms with exertion. Out of ProAir, using expired inhaler. Currently on Trelegy and daily Azithromycin. -Order Ventolin as replacement for ProAir. -Reduce Azithromycin to three times a week (Monday, Wednesday, Friday) to minimize potential hearing impact.  Lung Cancer Screening Due for annual low-dose  CT scan. Previous CT scans for kidney stone only captured lower lung fields. -Schedule low-dose CT scan for end of November/early December. If not contacted by year end, patient to inform office.  Follow-up in 1 year unless issues arise.     Health maintenance 01/12/2014-Prevnar 04/01/2011-Pneumovax  Up-to-date with COVID-19 booster and flu vaccine  Plan/Recommendations: - Continue Trelegy -Chronic  azithromycin 3 times/week - Low dose screening CT - Albuterol, duo nebs   Chilton Greathouse MD [facility] 02/10/2023, 2:14 PM  CC: Linda Aschoff, MD

## 2023-02-10 NOTE — Progress Notes (Signed)
Taitiana Matsuura    [id number]    08/17/1945  Primary Care Physician:Williams, Dorene Ar, MD  Referring Physician: Miguel Aschoff, MD [address] [place],  [place] [address]  Chief complaint:   Follow up for  COPD GOLD D  HPI: [name] is a 77 year-old with COPD (GOLD D, CAT score 25, multiple exacerbation over past year, FEV1 41%). Maintained on Spiriva and Symbicort. His symptoms have been stable until this year when she has several exacerbations over the winter requiring courses of antibiotics and steroids. Her primary care physician has referred her back to [place] for further management. Her chief complaint is dyspnea on exertion, loss of energy, fatigue,  chronic cough with sputum production.   She's had a diagnosis of breast cancer in April 2018 and underwent lumpectomy and XRT. She declined chemotherapy. Stopped smoking in January 2020.  She is admitted with recurrent exacerbations in 2020 requiring multiple courses of antibiotics and steroids Started on daily azithromycin in July 2020 and nebulizer therapy  Interim History Continues on chronic azithromycin and Trelegy Breathing is doing well with chronic dyspnea on exertion Mobility is limited by left ankle degenerative joint disease but she is still trying to exercise and do strength training in the gym.  Outpatient Encounter Medications as of 02/10/2023  Medication Sig   amLODipine (NORVASC) 10 MG tablet Take 1 tablet (10 mg total) by mouth daily.   aspirin EC 81 MG tablet Take 1 tablet (81 mg total) by mouth daily. Swallow whole.   azithromycin (ZITHROMAX) 250 MG tablet Take 1 tablet (250 mg total) by mouth daily.   Cholecalciferol (VITAMIN D3) 25 MCG (1000 UT) CAPS Take by mouth.   Fluticasone-Umeclidin-Vilant (TRELEGY ELLIPTA) 100-62.5-25 MCG/ACT AEPB Inhale 1 puff into the lungs daily. Inhale 1 puff by mouth once daily   levothyroxine (SYNTHROID) 88 MCG tablet Take 1 tablet (88 mcg total) by  mouth daily.   nicotine (NICOTROL) 10 MG inhaler Nicotrol 10 mg inhalation cartridge  USE 1 CARTRIDGE 16 TIMES A DAY AS NEEDED   olmesartan (BENICAR) 40 MG tablet Take 1 tablet (40 mg total) by mouth daily.   omeprazole (PRILOSEC) 20 MG capsule Take 20 mg by mouth daily before breakfast.    Omeprazole-Sodium Bicarbonate (ZEGERID) 20-1100 MG CAPS capsule Take 1 capsule by mouth daily before breakfast.   pravastatin (PRAVACHOL) 40 MG tablet Take 1 tablet (40 mg total) by mouth daily before breakfast.   PROAIR HFA 108 (90 Base) MCG/ACT inhaler INHALE 1 TO 2 PUFFS BY MOUTH EVERY 6 HOURS AS NEEDED FOR WHEEZING FOR SHORTNESS OF BREATH   Probiotic Product (ULTRAFLORA IMMUNE HEALTH PO) Take by mouth.   No facility-administered encounter medications on file as of 02/10/2023.   Physical Exam: Blood pressure 134/72, pulse 82, temperature 98.1 F (36.7 C), temperature source Oral, height 5\' 4"  (1.626 m), weight 129 lb 9.6 oz (58.8 kg), SpO2 90 %. Gen:      No acute distress HEENT:  EOMI, sclera anicteric Neck:     No masses; no thyromegaly Lungs:    Clear to auscultation bilaterally; normal respiratory effort CV:         Regular rate and rhythm; no murmurs Abd:      + bowel sounds; soft, non-tender; no palpable masses, no distension Ext:    No edema; adequate peripheral perfusion Skin:      Warm and dry; no rash Neuro: alert and oriented x 3 Psych: normal mood and  affect   Data Reviewed: Imaging CT chest 02/26/15- mild left lower lobe atelectasis, moderate emphysematous changes.  CT chest 08/01/16- moderate emphysematous changes.  No suspicious lung nodules or mass.  Chronic scarring in the right lower lobe. Chest x-ray 08/10/2019-emphysema, no acute cardiopulmonary abnormality. Chest x-ray 12/26/2020-emphysematous changes with no acute cardiopulmonary abnormality. I have reviewed the images personally  PFTs  02/18/11 FVC 2.42 [117%), FEV1 1.46 (69%), F/F 46, TLC 112%, DLCO 69% Moderate  obstructive defect, mild reduction in diffusion capacity.   08/23/15 FVC 2.14 [71%], FEV1 0.94 [41%], F/F 44, TLC 114%, DLCO 57% Severe obstructive defect with moderate reduction in diffusion capacity  Labs: Alpha-1 antitrypsin 10/05/18-205, PI MM  Assessment:   Severe COPD. GOLD stage D Continue trelegy inhaler Unable to afford Daliresp Continue azithromycin to 250 mg daily for anti-inflammatory effect.  She has seen an ENT doctor and [facility] last year. I recommended follow-up for audiology testing but she wants to hold off for now. Frequency of exacerbations have reduced since she was started on chronic azithromycin  She has been referred for low-dose screening CT of the chest but she did not return the phone calls last year We will make referral again  Health maintenance 01/12/2014-Prevnar 04/01/2011-Pneumovax  Up-to-date with COVID-19 booster and flu vaccine  Plan/Recommendations: - Continue Trelegy - Daily azithromycin - Albuterol, duo nebs  Chilton Greathouse MD [facility] 02/10/2023, 2:15 PM  CC: Miguel Aschoff, MD

## 2023-02-10 NOTE — Patient Instructions (Signed)
VISIT SUMMARY:  During today's visit, we discussed your ongoing COPD symptoms, your recent CT scans, and your dental prophylaxis routine. We addressed your concerns about your breathing difficulties and the use of an expired inhaler, as well as your upcoming lung cancer screening.  YOUR PLAN:  -COPD: Chronic Obstructive Pulmonary Disease (COPD) is a long-term lung condition that makes it hard to breathe. Your symptoms vary with physical activity, and you have been using an expired inhaler. We will replace your ProAir inhaler with Ventolin. Additionally, to minimize potential hearing issues, we are reducing your Azithromycin dosage to three times a week (Monday, Wednesday, Friday).  -LUNG CANCER SCREENING: Lung cancer screening involves checking your lungs for cancer before you have symptoms. You are due for your annual low-dose CT scan at the end of November or early December. If you are not contacted by the end of the year, please inform our office.  -DENTAL PROPHYLAXIS: Dental prophylaxis is the practice of taking antibiotics before dental procedures to prevent infections, especially important due to your history of hip fracture. Continue taking antibiotics before your dental visits as you have been doing.  INSTRUCTIONS:  Please follow up in one year unless any issues arise. If you do not hear from [place] regarding your low-dose CT scan by the end of the year, please contact our office.

## 2023-02-17 DIAGNOSIS — M19071 Primary osteoarthritis, right ankle and foot: Secondary | ICD-10-CM | POA: Diagnosis not present

## 2023-02-27 ENCOUNTER — Other Ambulatory Visit: Payer: Self-pay | Admitting: Pulmonary Disease

## 2023-02-27 DIAGNOSIS — J449 Chronic obstructive pulmonary disease, unspecified: Secondary | ICD-10-CM

## 2023-03-04 ENCOUNTER — Other Ambulatory Visit: Payer: Self-pay | Admitting: Pulmonary Disease

## 2023-03-11 NOTE — Progress Notes (Unsigned)
   77 y.o. Linda [name] is here for routine follow-up of chronic conditions; today specifically continuing conversation about what can be done to reduce risk associated with the incidental radiographic finding of aortic atherosclerosis.  At last visit her BP meds were changed to target better control and aspirin was added.  We discussed changing to a high intensity statin but made no changes at that time.  Since last visit patient has seen her pulmonologist Dr. Isaiah Serge (stable disease; no changes in therapy= Trelegy/daily azithro) and underwent a foot injection with Dr. Penni Bombard. She is also seeing a dentist for a cracked tooth.  ***  Patient Active Problem List   Diagnosis Date Noted   Abdominal aortic atherosclerosis (HCC) 12/04/2022   Carpal tunnel syndrome, right 08/29/2021   Irregular bowel habits 08/29/2021   Arthritis of right hand and wrist 08/29/2021   Hearing impairment 03/21/2021   GERD without esophagitis 03/21/2021   BPPV (benign paroxysmal positional vertigo) 06/05/2020   Hypertension 06/05/2020   Former smoker 05/28/2018   Degenerative joint disease of R ankle and foot 12/15/2017   Carcinoma of upper-outer quadrant of right breast in female, estrogen receptor positive (HCC) 07/25/2016   Osteoporosis 05/27/2016   Post-surgical hypothyroidism 05/09/2016   Hx of closed left femoral fracture (HCC) 05/2016 due to fall from upright position groundlevel 05/08/2016   COPD, group D, by GOLD 2017 classification (HCC) 01/28/2011   Current Outpatient Medications:    albuterol (VENTOLIN HFA) 108 (90 Base) MCG/ACT inhaler, Inhale 2 puffs into the lungs every 6 (six) hours as needed for wheezing or shortness of breath., Disp: 8 g, Rfl: 2   amLODipine (NORVASC) 10 MG tablet, Take 1 tablet (10 mg total) by mouth daily., Disp: 90 tablet, Rfl: 3   aspirin EC 81 MG tablet, Take 1 tablet (81 mg total) by mouth daily. Swallow whole., Disp: , Rfl:    azithromycin (ZITHROMAX) 250 MG tablet,  Take 1 tablet (250 mg total) by mouth daily. 1 tablet every Monday Wednesday and Friday, Disp: 12 tablet, Rfl: 5   Cholecalciferol (VITAMIN D3) 25 MCG (1000 UT) CAPS, Take by mouth., Disp: , Rfl:    Fluticasone-Umeclidin-Vilant (TRELEGY ELLIPTA) 100-62.5-25 MCG/ACT AEPB, Inhale 1 puff by mouth once daily, Disp: 60 each, Rfl: 10   levothyroxine (SYNTHROID) 88 MCG tablet, Take 1 tablet (88 mcg total) by mouth daily., Disp: 90 tablet, Rfl: 3   nicotine (NICOTROL) 10 MG inhaler, Nicotrol 10 mg inhalation cartridge  USE 1 CARTRIDGE 16 TIMES A DAY AS NEEDED, Disp: 42 each, Rfl: 3   olmesartan (BENICAR) 40 MG tablet, Take 1 tablet (40 mg total) by mouth daily., Disp: 30 tablet, Rfl: 11   omeprazole (PRILOSEC) 20 MG capsule, Take 20 mg by mouth daily before breakfast. , Disp: , Rfl:    Omeprazole-Sodium Bicarbonate (ZEGERID) 20-1100 MG CAPS capsule, Take 1 capsule by mouth daily before breakfast., Disp: , Rfl:    pravastatin (PRAVACHOL) 40 MG tablet, Take 1 tablet (40 mg total) by mouth daily before breakfast., Disp: 90 tablet, Rfl: 3   Probiotic Product (ULTRAFLORA IMMUNE HEALTH PO), Take by mouth., Disp: , Rfl:   Functional Status: ***  Objective There were no vitals taken for this visit.  Exam: ***  Assessment and Plan: There are no diagnoses linked to this encounter.   No follow-ups on file.

## 2023-03-12 ENCOUNTER — Encounter: Payer: Self-pay | Admitting: Internal Medicine

## 2023-03-12 ENCOUNTER — Ambulatory Visit (INDEPENDENT_AMBULATORY_CARE_PROVIDER_SITE_OTHER): Payer: PPO | Admitting: Internal Medicine

## 2023-03-12 VITALS — BP 139/77 | HR 87 | Temp 97.5°F | Ht 64.0 in | Wt 123.7 lb

## 2023-03-12 DIAGNOSIS — J449 Chronic obstructive pulmonary disease, unspecified: Secondary | ICD-10-CM

## 2023-03-12 DIAGNOSIS — I700 Atherosclerosis of aorta: Secondary | ICD-10-CM | POA: Diagnosis not present

## 2023-03-12 DIAGNOSIS — N179 Acute kidney failure, unspecified: Secondary | ICD-10-CM | POA: Diagnosis not present

## 2023-03-12 DIAGNOSIS — I10 Essential (primary) hypertension: Secondary | ICD-10-CM | POA: Diagnosis not present

## 2023-03-12 DIAGNOSIS — R634 Abnormal weight loss: Secondary | ICD-10-CM | POA: Diagnosis not present

## 2023-03-12 DIAGNOSIS — R34 Anuria and oliguria: Secondary | ICD-10-CM

## 2023-03-12 DIAGNOSIS — R1031 Right lower quadrant pain: Secondary | ICD-10-CM | POA: Diagnosis not present

## 2023-03-12 DIAGNOSIS — R109 Unspecified abdominal pain: Secondary | ICD-10-CM

## 2023-03-12 NOTE — Assessment & Plan Note (Signed)
139/77 on olmesartan 40 mg and amlodipine 10 mg daily. At goal, continue to monitor.

## 2023-03-12 NOTE — Patient Instructions (Addendum)
Linda [name],  I'm concerned about your pain and agree that you need to have some tests done.  I've schedule a CT scan and you'll be called to get this scheduled.  We are also checking a urine specimen and blood kidney function test.  I'll message you with results in MYChart.  You can let your family know that your cholesterol was 150 this year!  That's great. We'll check it again today.  I'll be thinking with you.  Please go to the ER if your pain worsens.    DR. Mayford Knife

## 2023-03-12 NOTE — Assessment & Plan Note (Signed)
Appetite and caloric intake have been adequate. No night sweats.  6# loss in a year, gradual.  Will check TSH to ensure levothyroxine dose is correct.  She may be experiencing catabolic weight loss from her COPD.

## 2023-03-12 NOTE — Assessment & Plan Note (Signed)
Recent routine visit with pulmonologist Dr. Isaiah Serge; no changes in therapy.  Exhibits unchanged pursed lip breathing today.  Plan is for lung CT screen in early 2025.

## 2023-03-12 NOTE — Assessment & Plan Note (Signed)
R flank/RLQ pain radiating into groin, accompanied by urinary frequency and urgency with less urine produced.  NO F/C; no dysuria.  Recommended urgent CT urogram though she doesn't have time to complete this today, and isn't sure how it will work into her schedule.  Will check UA today and order the urogram to be done when she can accommodate.  I've advised her to go to ED if pain worsens.

## 2023-03-13 ENCOUNTER — Encounter: Payer: Self-pay | Admitting: Internal Medicine

## 2023-03-13 LAB — BMP8+ANION GAP
Anion Gap: 15 mmol/L (ref 10.0–18.0)
BUN/Creatinine Ratio: 19 (ref 12–28)
BUN: 23 mg/dL (ref 8–27)
CO2: 23 mmol/L (ref 20–29)
Calcium: 9.8 mg/dL (ref 8.7–10.3)
Chloride: 103 mmol/L (ref 96–106)
Creatinine, Ser: 1.23 mg/dL — ABNORMAL HIGH (ref 0.57–1.00)
Glucose: 98 mg/dL (ref 70–99)
Potassium: 4.4 mmol/L (ref 3.5–5.2)
Sodium: 141 mmol/L (ref 134–144)
eGFR: 45 mL/min/{1.73_m2} — ABNORMAL LOW (ref >59)

## 2023-03-13 LAB — URINALYSIS, ROUTINE W REFLEX MICROSCOPIC
Bilirubin, UA: NEGATIVE
Glucose, UA: NEGATIVE
Ketones, UA: NEGATIVE
Leukocytes,UA: NEGATIVE
Nitrite, UA: NEGATIVE
Protein,UA: NEGATIVE
RBC, UA: NEGATIVE
Specific Gravity, UA: 1.005 (ref 1.005–1.030)
Urobilinogen, Ur: 0.2 mg/dL (ref 0.2–1.0)
pH, UA: 7.5 (ref 5.0–7.5)

## 2023-03-13 LAB — LIPID PANEL
Chol/HDL Ratio: 3.8 {ratio} (ref 0.0–4.4)
Cholesterol, Total: 165 mg/dL (ref 100–199)
HDL: 43 mg/dL (ref >39)
LDL Chol Calc (NIH): 91 mg/dL (ref 0–99)
Triglycerides: 180 mg/dL — ABNORMAL HIGH (ref 0–149)
VLDL Cholesterol Cal: 31 mg/dL (ref 5–40)

## 2023-03-13 LAB — TSH: TSH: 0.24 u[IU]/mL — ABNORMAL LOW (ref 0.450–4.500)

## 2023-03-13 NOTE — Progress Notes (Signed)
Linda [name], How are you feeling? The urine still has microscopic amounts of blood.  Your kidney's filtering function (the creatinine) has improved since the summer, but isn't back to your normal yet.  I'm hopeful that you're able to get the CT scan scheduled soon.  Still waiting on your thyroid test, it takes a few days. Take care, Dr. Mayford Knife

## 2023-03-17 ENCOUNTER — Other Ambulatory Visit: Payer: Self-pay | Admitting: Internal Medicine

## 2023-03-17 DIAGNOSIS — R1031 Right lower quadrant pain: Secondary | ICD-10-CM

## 2023-03-17 DIAGNOSIS — E890 Postprocedural hypothyroidism: Secondary | ICD-10-CM

## 2023-03-17 DIAGNOSIS — R109 Unspecified abdominal pain: Secondary | ICD-10-CM

## 2023-03-17 MED ORDER — LEVOTHYROXINE SODIUM 75 MCG PO TABS: 75.0000 ug | ORAL_TABLET | Freq: Every day | ORAL | 3 refills | Status: DC

## 2023-03-19 ENCOUNTER — Other Ambulatory Visit (HOSPITAL_COMMUNITY): Payer: PPO

## 2023-03-19 ENCOUNTER — Telehealth: Payer: Self-pay | Admitting: Pulmonary Disease

## 2023-03-19 NOTE — Telephone Encounter (Signed)
Patient would like CT scan on 05/07/2023 at [facility]. Patient phone number is [phone].

## 2023-03-20 ENCOUNTER — Encounter (HOSPITAL_BASED_OUTPATIENT_CLINIC_OR_DEPARTMENT_OTHER): Payer: Self-pay

## 2023-03-23 NOTE — Telephone Encounter (Signed)
Hi Lung cancer screening team,  She is due for a follow up screening CT but did not answer calls. Can we arrange a scan on Jan 6th when she has another one scheduled. Thanks  Dr. Isaiah Serge

## 2023-03-26 ENCOUNTER — Other Ambulatory Visit: Payer: Self-pay

## 2023-03-26 DIAGNOSIS — Z122 Encounter for screening for malignant neoplasm of respiratory organs: Secondary | ICD-10-CM

## 2023-03-26 DIAGNOSIS — Z87891 Personal history of nicotine dependence: Secondary | ICD-10-CM

## 2023-04-06 ENCOUNTER — Ambulatory Visit (HOSPITAL_BASED_OUTPATIENT_CLINIC_OR_DEPARTMENT_OTHER)
Admission: RE | Admit: 2023-04-06 | Discharge: 2023-04-06 | Disposition: A | Discharge status: Home / Self Care | Payer: PPO | Source: Ambulatory Visit | Attending: Acute Care | Admitting: Acute Care

## 2023-04-06 ENCOUNTER — Ambulatory Visit (HOSPITAL_BASED_OUTPATIENT_CLINIC_OR_DEPARTMENT_OTHER)
Admission: RE | Admit: 2023-04-06 | Discharge: 2023-04-06 | Disposition: A | Discharge status: Home / Self Care | Payer: PPO | Source: Ambulatory Visit | Attending: Internal Medicine | Admitting: Internal Medicine

## 2023-04-06 ENCOUNTER — Encounter: Payer: Self-pay | Admitting: Internal Medicine

## 2023-04-06 DIAGNOSIS — R109 Unspecified abdominal pain: Secondary | ICD-10-CM | POA: Insufficient documentation

## 2023-04-06 DIAGNOSIS — I700 Atherosclerosis of aorta: Secondary | ICD-10-CM | POA: Diagnosis not present

## 2023-04-06 DIAGNOSIS — Z122 Encounter for screening for malignant neoplasm of respiratory organs: Secondary | ICD-10-CM | POA: Diagnosis not present

## 2023-04-06 DIAGNOSIS — K429 Umbilical hernia without obstruction or gangrene: Secondary | ICD-10-CM | POA: Diagnosis not present

## 2023-04-06 DIAGNOSIS — J439 Emphysema, unspecified: Secondary | ICD-10-CM | POA: Diagnosis not present

## 2023-04-06 DIAGNOSIS — R932 Abnormal findings on diagnostic imaging of liver and biliary tract: Secondary | ICD-10-CM | POA: Diagnosis not present

## 2023-04-06 DIAGNOSIS — N200 Calculus of kidney: Secondary | ICD-10-CM | POA: Diagnosis not present

## 2023-04-06 DIAGNOSIS — I2510 Atherosclerotic heart disease of native coronary artery without angina pectoris: Secondary | ICD-10-CM | POA: Insufficient documentation

## 2023-04-06 DIAGNOSIS — D3502 Benign neoplasm of left adrenal gland: Secondary | ICD-10-CM | POA: Diagnosis not present

## 2023-04-06 DIAGNOSIS — Z87891 Personal history of nicotine dependence: Secondary | ICD-10-CM | POA: Diagnosis not present

## 2023-04-10 ENCOUNTER — Ambulatory Visit: Payer: Self-pay | Admitting: *Deleted

## 2023-04-10 ENCOUNTER — Other Ambulatory Visit (HOSPITAL_BASED_OUTPATIENT_CLINIC_OR_DEPARTMENT_OTHER): Payer: Self-pay

## 2023-04-10 ENCOUNTER — Other Ambulatory Visit: Payer: Self-pay

## 2023-04-10 ENCOUNTER — Emergency Department (HOSPITAL_BASED_OUTPATIENT_CLINIC_OR_DEPARTMENT_OTHER)
Admission: EM | Admit: 2023-04-10 | Discharge: 2023-04-10 | Disposition: A | Discharge status: Home / Self Care | Payer: PPO | Attending: Emergency Medicine | Admitting: Emergency Medicine

## 2023-04-10 DIAGNOSIS — Z79899 Other long term (current) drug therapy: Secondary | ICD-10-CM | POA: Diagnosis not present

## 2023-04-10 DIAGNOSIS — Z7982 Long term (current) use of aspirin: Secondary | ICD-10-CM | POA: Insufficient documentation

## 2023-04-10 DIAGNOSIS — N200 Calculus of kidney: Secondary | ICD-10-CM | POA: Insufficient documentation

## 2023-04-10 DIAGNOSIS — J449 Chronic obstructive pulmonary disease, unspecified: Secondary | ICD-10-CM | POA: Insufficient documentation

## 2023-04-10 DIAGNOSIS — Z7951 Long term (current) use of inhaled steroids: Secondary | ICD-10-CM | POA: Diagnosis not present

## 2023-04-10 DIAGNOSIS — R109 Unspecified abdominal pain: Secondary | ICD-10-CM | POA: Diagnosis present

## 2023-04-10 DIAGNOSIS — I10 Essential (primary) hypertension: Secondary | ICD-10-CM | POA: Insufficient documentation

## 2023-04-10 LAB — BASIC METABOLIC PANEL
Anion gap: 11 (ref 5–15)
BUN: 24 mg/dL — ABNORMAL HIGH (ref 8–23)
CO2: 24 mmol/L (ref 22–32)
Calcium: 10.1 mg/dL (ref 8.9–10.3)
Chloride: 102 mmol/L (ref 98–111)
Creatinine, Ser: 1.27 mg/dL — ABNORMAL HIGH (ref 0.44–1.00)
GFR, Estimated: 43 mL/min — ABNORMAL LOW (ref >60)
Glucose, Bld: 119 mg/dL — ABNORMAL HIGH (ref 70–99)
Potassium: 4.4 mmol/L (ref 3.5–5.1)
Sodium: 137 mmol/L (ref 135–145)

## 2023-04-10 LAB — CBC
HCT: 43 % (ref 36.0–46.0)
Hemoglobin: 14.1 g/dL (ref 12.0–15.0)
MCH: 30.1 pg (ref 26.0–34.0)
MCHC: 32.8 g/dL (ref 30.0–36.0)
MCV: 91.9 fL (ref 80.0–100.0)
Platelets: 269 10*3/uL (ref 150–400)
RBC: 4.68 MIL/uL (ref 3.87–5.11)
RDW: 12.6 % (ref 11.5–15.5)
WBC: 12.5 10*3/uL — ABNORMAL HIGH (ref 4.0–10.5)
nRBC: 0 % (ref 0.0–0.2)

## 2023-04-10 LAB — URINALYSIS, MICROSCOPIC (REFLEX): Bacteria, UA: NONE SEEN

## 2023-04-10 LAB — URINALYSIS, ROUTINE W REFLEX MICROSCOPIC
Bilirubin Urine: NEGATIVE
Glucose, UA: NEGATIVE mg/dL
Ketones, ur: NEGATIVE mg/dL
Leukocytes,Ua: NEGATIVE
Nitrite: NEGATIVE
Protein, ur: NEGATIVE mg/dL
Specific Gravity, Urine: 1.02 (ref 1.005–1.030)
pH: 7.5 (ref 5.0–8.0)

## 2023-04-10 MED ORDER — TAMSULOSIN HCL 0.4 MG PO CAPS
0.4000 mg | ORAL_CAPSULE | Freq: Every day | ORAL | 0 refills | Status: DC
  Filled 2023-04-10: qty 30, 30d supply, fill #0

## 2023-04-10 MED ORDER — MORPHINE SULFATE (PF) 4 MG/ML IV SOLN
4.0000 mg | Freq: Once | INTRAVENOUS | Status: AC
Start: 2023-04-10 — End: 2023-04-10
  Administered 2023-04-10: 4 mg via INTRAVENOUS
  Filled 2023-04-10: qty 1

## 2023-04-10 MED ORDER — KETOROLAC TROMETHAMINE 30 MG/ML IJ SOLN
15.0000 mg | Freq: Once | INTRAMUSCULAR | Status: AC
  Administered 2023-04-10: 15 mg via INTRAVENOUS
  Filled 2023-04-10: qty 1

## 2023-04-10 MED ORDER — MORPHINE SULFATE 15 MG PO TABS
15.0000 mg | ORAL_TABLET | Freq: Three times a day (TID) | ORAL | 0 refills | Status: AC | PRN
  Filled 2023-04-10: qty 9, 3d supply, fill #0

## 2023-04-10 MED ORDER — ONDANSETRON HCL 4 MG/2ML IJ SOLN
4.0000 mg | Freq: Once | INTRAMUSCULAR | Status: AC
  Administered 2023-04-10: 4 mg via INTRAVENOUS
  Filled 2023-04-10: qty 2

## 2023-04-10 MED ORDER — SODIUM CHLORIDE 0.9 % IV BOLUS
1000.0000 mL | Freq: Once | INTRAVENOUS | Status: AC
  Administered 2023-04-10: 1000 mL via INTRAVENOUS

## 2023-04-10 NOTE — ED Triage Notes (Signed)
 NAD. Ambulatory to triage. Comes in today with flank pain- Right. Hx stones. Pain much worse past 12 hrs. Afebrile. +N/-V.

## 2023-04-10 NOTE — Telephone Encounter (Signed)
  Chief Complaint: flank pain , frequent urination, vomiting per patient daughter not with patient at this time Symptoms: see above Frequency: na Pertinent Negatives: Patient denies na Disposition: [x] ED /[] Urgent Care (no appt availability in office) / [] Appointment(In office/virtual)/ []  [facility]/ [] Home Care/ [] Refused Recommended Disposition /[] [facility]/ []  Follow-up with PCP Additional Notes:   Patient daughter asking if patient can be seen at [facility]. Reviewed with patient daughter ED appropriate and [facility] does not admit patient's in hospital if needed and patient would have to go another ED if MD feels she needs further evaluation. Patient's daughter verbalized understanding.         Reason for Disposition  Vomiting  Answer Assessment - Initial Assessment Questions 1. LOCATION: Where does it hurt? (e.g., left, right)     Flank pain per patient daughter not with patient now  2. ONSET: When did the pain start?     na 3. SEVERITY: How bad is the pain? (e.g., Scale 1-10; mild, moderate, or severe)   - MILD (1-3): doesn't interfere with normal activities    - MODERATE (4-7): interferes with normal activities or awakens from sleep    - SEVERE (8-10): excruciating pain and patient unable to do normal activities (stays in bed)       Worsening  4. PATTERN: Does the pain come and go, or is it constant?      constant 5. CAUSE: What do you think is causing the pain?     Possible kidney stone 6. OTHER SYMPTOMS:  Do you have any other symptoms? (e.g., fever, abdomen pain, vomiting, leg weakness, burning with urination, blood in urine)     Frequent urination, side pain back pain. Vomiting  7. PREGNANCY:  Is there any chance you are pregnant? When was your last menstrual period?     na  Protocols used: Flank Pain-A-AH

## 2023-04-10 NOTE — ED Provider Notes (Signed)
 [place] EMERGENCY DEPARTMENT AT [facility] Provider Note   CSN: [id number] Arrival date & time: 04/10/23  1446     History  No chief complaint on file.   Linda [name] is a 78 y.o. female.  With a history of kidney stones and COPD who presents to the ED for right flank pain.  Patient was seen 4 days ago in this ED for right flank pain.  CT renal study at that time showed bilateral stones nonobstructing with a large stone measuring 6 mm.  She was sent home with urology follow-up after pain was controlled.  Returns today for worsening right flank pain that got worse last night and has been 8 out of 10 in severity.  Took Azo at home earlier but no other medications prior to arrival.  Nausea and episode of vomiting earlier.  No fevers, chills, dysuria or other abdominal pain.  She is followed by [facility].  HPI     Home Medications Prior to Admission medications   Medication Sig Start Date End Date Taking? Authorizing Provider  morphine  (MSIR) 15 MG tablet Take 1 tablet (15 mg total) by mouth every 8 (eight) hours as needed for up to 3 days for severe pain (pain score 7-10). 04/10/23 04/13/23 Yes Pamella Ozell LABOR, DO  tamsulosin  (FLOMAX ) 0.4 MG CAPS capsule Take 1 capsule (0.4 mg total) by mouth daily. 04/10/23  Yes Pamella Ozell LABOR, DO  albuterol  (VENTOLIN  HFA) 108 (90 Base) MCG/ACT inhaler Inhale 2 puffs into the lungs every 6 (six) hours as needed for wheezing or shortness of breath. 02/10/23   Mannam, Praveen, MD  amLODipine  (NORVASC ) 10 MG tablet Take 1 tablet (10 mg total) by mouth daily. 12/11/22 12/06/23  Trudy Mliss Dragon, MD  aspirin  EC 81 MG tablet Take 1 tablet (81 mg total) by mouth daily. Swallow whole. 12/04/22 12/04/23  Trudy Mliss Dragon, MD  azithromycin  (ZITHROMAX ) 250 MG tablet Take 1 tablet (250 mg total) by mouth daily. 1 tablet every Monday Wednesday and Friday 03/02/23   Mannam, Praveen, MD  Cholecalciferol (VITAMIN D3) 25 MCG (1000 UT) CAPS Take by  mouth.    [provider]  Fluticasone -Umeclidin-Vilant (TRELEGY ELLIPTA ) 100-62.5-25 MCG/ACT AEPB Inhale 1 puff by mouth once daily 03/06/23   Mannam, Praveen, MD  levothyroxine  (SYNTHROID ) 75 MCG tablet Take 1 tablet (75 mcg total) by mouth daily before breakfast. 03/17/23   Trudy Mliss Dragon, MD  nicotine  (NICOTROL ) 10 MG inhaler Nicotrol  10 mg inhalation cartridge  USE 1 CARTRIDGE 16 TIMES A DAY AS NEEDED 10/30/20   Trudy Mliss Dragon, MD  olmesartan  (BENICAR ) 40 MG tablet Take 1 tablet (40 mg total) by mouth daily. 12/04/22 12/04/23  Trudy Mliss Dragon, MD  omeprazole (PRILOSEC) 20 MG capsule Take 20 mg by mouth daily before breakfast.     [provider]  Omeprazole-Sodium Bicarbonate (ZEGERID) 20-1100 MG CAPS capsule Take 1 capsule by mouth daily before breakfast.    [provider]  pravastatin  (PRAVACHOL ) 40 MG tablet Take 1 tablet (40 mg total) by mouth daily before breakfast. 09/30/22 09/25/23  Trudy Mliss Dragon, MD  Probiotic Product [facility] IMMUNE HEALTH PO) Take by mouth.    [provider]      Allergies    Aspirin     Review of Systems   Review of Systems  Physical Exam Updated Vital Signs BP (!) 164/78 (BP Location: Right Arm)   Pulse 72   Temp 98.3 F (36.8 C)   Resp 18   SpO2  96%  Physical Exam Vitals and nursing note reviewed.  HENT:     Head: Normocephalic and atraumatic.  Eyes:     Pupils: Pupils are equal, round, and reactive to light.  Cardiovascular:     Rate and Rhythm: Normal rate and regular rhythm.  Pulmonary:     Effort: Pulmonary effort is normal.     Breath sounds: Normal breath sounds.  Abdominal:     Palpations: Abdomen is soft.     Tenderness: There is no abdominal tenderness. There is right CVA tenderness.  Skin:    General: Skin is warm and dry.  Neurological:     Mental Status: She is alert.  Psychiatric:        Mood and Affect: Mood normal.     ED Results / Procedures / Treatments    Labs (all labs ordered are listed, but only abnormal results are displayed) Labs Reviewed  URINALYSIS, ROUTINE W REFLEX MICROSCOPIC - Abnormal; Notable for the following components:      Result Value   Color, Urine STRAW (*)    Hgb urine dipstick SMALL (*)    All other components within normal limits  BASIC METABOLIC PANEL - Abnormal; Notable for the following components:   Glucose, Bld 119 (*)    BUN 24 (*)    Creatinine, Ser 1.27 (*)    GFR, Estimated 43 (*)    All other components within normal limits  CBC - Abnormal; Notable for the following components:   WBC 12.5 (*)    All other components within normal limits  URINALYSIS, MICROSCOPIC (REFLEX)    EKG None  Radiology No results found.  Procedures Procedures    Medications Ordered in ED Medications  sodium chloride  0.9 % bolus 1,000 mL (1,000 mLs Intravenous New Bag/Given 04/10/23 1545)  ketorolac  (TORADOL ) 30 MG/ML injection 15 mg (15 mg Intravenous Given 04/10/23 1542)  ondansetron  (ZOFRAN ) injection 4 mg (4 mg Intravenous Given 04/10/23 1544)  morphine  (PF) 4 MG/ML injection 4 mg (4 mg Intravenous Given 04/10/23 1627)    ED Course/ Medical Decision Making/ A&P Clinical Course as of 04/10/23 1657  Fri Apr 10, 2023  1653 White count of 12.5 most likely in the setting of vomiting earlier.  No evidence of urinary tract infection.  Positive hematuria.  Reevaluated patient she reports significant relief after dose of morphine  here.  She does not have opioid analgesics at home.  Will give her short supply of p.o. morphine  to help with severe pain.  She understands she should not drive or drink alcohol  while taking this medication.  Will also call in Flomax  for her.  She will follow-up with her urologist.  Stable for discharge [MP]    Clinical Course User Index [MP] Pamella Ozell LABOR, DO                                 Medical Decision Making 78 year old female with history of kidney stones returns for right flank pain.   Acute worsening of her right flank pain last night.  Out of 10 in severity.  Was seen for this 4 days ago.  CT abdomen pelvis showed multiple bilateral stones nonobstructing.  Was discharged with urology follow-up.  Pain is not well-controlled at home today.  No fevers or chills.  Vital signs notable for hypertension.  No fevers or tachycardia.  Right CVA tenderness on exam.  Will provide symptomatic management with IV fluids Toradol  and  Zofran  and reassess.  Will obtain laboratory workup including CBC metabolic panel urinalysis.  Suspect if her pain is well-controlled she can follow-up with urology  Amount and/or Complexity of Data Reviewed Labs: ordered.  Risk Prescription drug management.           Final Clinical Impression(s) / ED Diagnoses Final diagnoses:  Kidney stone    Rx / DC Orders ED Discharge Orders          Ordered    morphine  (MSIR) 15 MG tablet  Every 8 hours PRN        04/10/23 1656    tamsulosin  (FLOMAX ) 0.4 MG CAPS capsule  Daily        04/10/23 1656              Pamella Ozell LABOR, DO 04/10/23 1657

## 2023-04-10 NOTE — Discharge Instructions (Signed)
 You are seen in the emergency department again for right flank pain due to kidney stone There was no evidence of urinary tract infection Your pain improved after IV fluids and a dose of morphine  We have called in a prescription for morphine  for you to take as needed for severe pain Do not drink alcohol  or drive while taking morphine  Take Tylenol  and/or Motrin as directed for mild to moderate pain We have also called in a prescription for Flomax  for you to take as directed to help with the passing of kidney stone Follow-up with your urologist as discussed Return to the emergency department for severe pain, fevers or any other concerns

## 2023-04-15 ENCOUNTER — Other Ambulatory Visit: Payer: Self-pay

## 2023-04-16 ENCOUNTER — Telehealth: Payer: Self-pay

## 2023-04-16 NOTE — Progress Notes (Signed)
Transition Care Management Follow-up Telephone Call Date of discharge and from where: 04/10/2023 [facility] How have you been since you were released from the hospital? Patient stated the pain comes and goes it is not constant. Any questions or concerns? No  Items Reviewed: Did the pt receive and understand the discharge instructions provided? Yes  Medications obtained and verified? Yes  Other? No  Any new allergies since your discharge? No  Dietary orders reviewed? Yes Do you have support at home? Yes   Follow up appointments reviewed:  PCP Hospital f/u appt confirmed? Yes  Scheduled to see Miguel Aschoff, MD on 05/21/2023 @ [facility]. Specialist Hospital f/u appt confirmed? Yes  Scheduled to see  on 04/17/2023 @ [facility]. Are transportation arrangements needed? No  If their condition worsens, is the pt aware to call PCP or go to the Emergency Dept.? Yes Was the patient provided with contact information for the PCP's office or ED? Yes Was to pt encouraged to call back with questions or concerns? Yes   Eon Zunker [facility]  [facility] Guide Direct Dial: [phone]  Fax: [phone] Website: [email or web address]

## 2023-04-17 DIAGNOSIS — R1084 Generalized abdominal pain: Secondary | ICD-10-CM | POA: Diagnosis not present

## 2023-04-17 DIAGNOSIS — R109 Unspecified abdominal pain: Secondary | ICD-10-CM | POA: Diagnosis not present

## 2023-04-17 DIAGNOSIS — Z87442 Personal history of urinary calculi: Secondary | ICD-10-CM | POA: Diagnosis not present

## 2023-05-07 ENCOUNTER — Ambulatory Visit (HOSPITAL_BASED_OUTPATIENT_CLINIC_OR_DEPARTMENT_OTHER): Payer: PPO

## 2023-05-21 ENCOUNTER — Encounter: Payer: PPO | Admitting: Internal Medicine

## 2023-06-14 ENCOUNTER — Other Ambulatory Visit: Payer: Self-pay | Admitting: Pulmonary Disease

## 2023-06-14 DIAGNOSIS — J449 Chronic obstructive pulmonary disease, unspecified: Secondary | ICD-10-CM

## 2023-07-02 ENCOUNTER — Ambulatory Visit (INDEPENDENT_AMBULATORY_CARE_PROVIDER_SITE_OTHER): Payer: PPO | Admitting: Internal Medicine

## 2023-07-02 VITALS — BP 153/81 | HR 80 | Temp 97.7°F | Ht 64.0 in | Wt 129.2 lb

## 2023-07-02 DIAGNOSIS — Z87891 Personal history of nicotine dependence: Secondary | ICD-10-CM | POA: Diagnosis not present

## 2023-07-02 DIAGNOSIS — R634 Abnormal weight loss: Secondary | ICD-10-CM | POA: Diagnosis not present

## 2023-07-02 DIAGNOSIS — M19071 Primary osteoarthritis, right ankle and foot: Secondary | ICD-10-CM | POA: Diagnosis not present

## 2023-07-02 DIAGNOSIS — H8111 Benign paroxysmal vertigo, right ear: Secondary | ICD-10-CM

## 2023-07-02 DIAGNOSIS — N1831 Chronic kidney disease, stage 3a: Secondary | ICD-10-CM

## 2023-07-02 DIAGNOSIS — E890 Postprocedural hypothyroidism: Secondary | ICD-10-CM | POA: Diagnosis not present

## 2023-07-02 DIAGNOSIS — N200 Calculus of kidney: Secondary | ICD-10-CM

## 2023-07-02 DIAGNOSIS — I10 Essential (primary) hypertension: Secondary | ICD-10-CM

## 2023-07-02 DIAGNOSIS — D3502 Benign neoplasm of left adrenal gland: Secondary | ICD-10-CM

## 2023-07-02 DIAGNOSIS — I129 Hypertensive chronic kidney disease with stage 1 through stage 4 chronic kidney disease, or unspecified chronic kidney disease: Secondary | ICD-10-CM

## 2023-07-02 MED ORDER — MELOXICAM 7.5 MG PO TABS: 7.5000 mg | ORAL_TABLET | Freq: Every day | ORAL | 3 refills | Status: DC

## 2023-07-02 MED ORDER — MECLIZINE HCL 25 MG PO TABS: 25.0000 mg | ORAL_TABLET | Freq: Three times a day (TID) | ORAL | 0 refills | Status: DC | PRN

## 2023-07-02 NOTE — Assessment & Plan Note (Signed)
 Weight has recovered.  Will resolve problem.

## 2023-07-02 NOTE — Assessment & Plan Note (Signed)
 F/u TSH today after previous dose adjustment.

## 2023-07-02 NOTE — Progress Notes (Signed)
 78 y.o. Linda [name] is here for routine follow-up of HTN, and more recent problems of inintended weight loss and R flank pain.  At most recent visit before the holidays she was experiencing severe R sided pain and given concern about kidney stone (which she has experienced before), she underwent imaging and was seen in ED.  No utereral stone, and sympoms eventually eased off.  Since last visit patient has seen the urologist to f/u her kidney stones, and she sees her family medicine sports med doctor fairly frequently for her feet and ankle arthritis.   Vertigo frequently, when changing positions, lying down, standing up, turning to R. She doesn't recall being prescribed meclizine a few years ago for vertigo. Has had vestibular rehab PT in the past, does home exercises.  Mild nausea, no vomiting. No headache. Generalized decreased hearing over the years. No tinnitus. Wonders about vagus nerve, given also problems with digestion, based on online reading. Breathing heavier during weather changes. Worked in garden yesterday.   Patient Active Problem List   Diagnosis Date Noted   Adenoma of left adrenal gland, radiographically benign and stable appearance 07/03/2023   Bilateral nephrolithiasis 07/03/2023   CKD stage 3a, GFR 45-59 ml/min (HCC) 07/02/2023   Unintended weight loss 03/12/2023   Abdominal aortic atherosclerosis (HCC) 12/04/2022   Carpal tunnel syndrome, right 08/29/2021   Irregular bowel habits 08/29/2021   Arthritis of right hand and wrist 08/29/2021   Hearing impairment 03/21/2021   GERD without esophagitis 03/21/2021   BPPV (benign paroxysmal positional vertigo) 06/05/2020   Hypertension 06/05/2020   Former smoker 05/28/2018   Degenerative joint disease of R ankle and foot 12/15/2017   History of breast cancer 07/25/2016   Osteoporosis 05/27/2016   Post-surgical hypothyroidism 05/09/2016   Hx of closed left femoral fracture (HCC) 05/2016 due to fall from upright position  groundlevel 05/08/2016   COPD, group D, by GOLD 2017 classification (HCC) 01/28/2011    Current Outpatient Medications:    empagliflozin (JARDIANCE) 10 MG TABS tablet, Take 1 tablet (10 mg total) by mouth daily before breakfast., Disp: 30 tablet, Rfl: 11   meclizine (ANTIVERT) 25 MG tablet, Take 1 tablet (25 mg total) by mouth 3 (three) times daily as needed for dizziness., Disp: 60 tablet, Rfl: 0   meloxicam (MOBIC) 7.5 MG tablet, Take 1 tablet (7.5 mg total) by mouth daily., Disp: 90 tablet, Rfl: 3   albuterol (VENTOLIN HFA) 108 (90 Base) MCG/ACT inhaler, Inhale 2 puffs into the lungs every 6 (six) hours as needed for wheezing or shortness of breath., Disp: 8 g, Rfl: 2   amLODipine (NORVASC) 10 MG tablet, Take 1 tablet (10 mg total) by mouth daily., Disp: 90 tablet, Rfl: 3   aspirin EC 81 MG tablet, Take 1 tablet (81 mg total) by mouth daily. Swallow whole., Disp: , Rfl:    azithromycin (ZITHROMAX) 250 MG tablet, Take 1 tablet (250 mg total) by mouth daily., Disp: 30 tablet, Rfl: 5   Cholecalciferol (VITAMIN D3) 25 MCG (1000 UT) CAPS, Take by mouth., Disp: , Rfl:    Fluticasone-Umeclidin-Vilant (TRELEGY ELLIPTA) 100-62.5-25 MCG/ACT AEPB, Inhale 1 puff by mouth once daily, Disp: 60 each, Rfl: 10   levothyroxine (SYNTHROID) 75 MCG tablet, Take 1 tablet (75 mcg total) by mouth daily before breakfast., Disp: 90 tablet, Rfl: 3   nicotine (NICOTROL) 10 MG inhaler, Nicotrol 10 mg inhalation cartridge  USE 1 CARTRIDGE 16 TIMES A DAY AS NEEDED, Disp: 42 each, Rfl: 3   olmesartan (BENICAR)  40 MG tablet, Take 1 tablet (40 mg total) by mouth daily., Disp: 30 tablet, Rfl: 11   omeprazole (PRILOSEC) 20 MG capsule, Take 20 mg by mouth daily before breakfast. , Disp: , Rfl:    Omeprazole-Sodium Bicarbonate (ZEGERID) 20-1100 MG CAPS capsule, Take 1 capsule by mouth daily before breakfast., Disp: , Rfl:    pravastatin (PRAVACHOL) 40 MG tablet, Take 1 tablet (40 mg total) by mouth daily before breakfast., Disp: 90  tablet, Rfl: 3   Probiotic Product (ULTRAFLORA IMMUNE HEALTH PO), Take by mouth., Disp: , Rfl:   Functional Status: Independently active, lives alone, drives and enjoys traveling and working in the yard.  Objective BP (!) 153/81 (BP Location: Left Arm, Patient Position: Sitting, Cuff Size: Small)   Pulse 80   Temp 97.7 F (36.5 C) (Oral)   Ht 5\' 4"  (1.626 m)   Wt 129 lb 3.2 oz (58.6 kg)   SpO2 100%   BMI 22.18 kg/m   Exam: Slender attractive woman moves well if not more cautiously than usual do to her episodic vertigo.  No nystagmus on directional gaze (thus HINTS not performed).  Sxs reproduced with upward gaze and with turning head to right more than to left.  Sxs also precipitated by standing up. Once she closed her eyes and focused for a moment, she was ok.    Problems addressed today:  Post-surgical hypothyroidism F/u TSH today after previous dose adjustment.   Hypertension Uncontrolled 153/81 on olmesartan 40 and amlodipine 10, which she took this morning. Additional agent needed. For additional renal protection will select empagliflozin, which may provide a bit of antihypertensive effect as well.  After she completes her recent 49 supply of amlod and olmesart, I'll ill combine the ARB/CCB to reduce pill burden.  She will check Bps at home and  notify my if SBP is not consistently in 130s (currently 140s-150s).   BPPV (benign paroxysmal positional vertigo) Positional vertigo sxs have returned; noticed when lying down, standing up, turning head, particularly to R. Queasiness w/o vomiting.  No tinnitus, no headache.  Has experienced gradual hearing loss over time.  SXS reproduced on exam by looking up and to the right, though no nystagmus in any direction.  She mentioned concern about the vagus nerve which she'd been reading about.  For now we will initiate trial of meclizine. If ineffective she may benefit from return to vestibular rehab for Eppley maneuver.    Unintended weight  loss Weight has recovered.  Will resolve problem.   Degenerative joint disease of R ankle and foot Meloxicam refilled; she takes one tab daily for arthritic pain.  Caution with renal fxn.  I will check at next visit.   CKD stage 3a, GFR 45-59 ml/min (HCC) Etiology suspected to be related to HTN and possibly chronic NSAID use. Check renal fxn today. She reports inability to stop daily meloxicam due to severe headaches upon cessation. Will begin empagliflozin for renal protection.   Former smoker Lung cancer screening completed 04/2023; continue annual screen.

## 2023-07-02 NOTE — Patient Instructions (Signed)
 Ms. Jurewicz,  I'm sorry you're experiencing problems with vertigo, it's a very uncomfortable problem.  We discussed trying the meclizine tablets (= Antivert); 25 mg one pill as often as every 6 hours or so to relieve vertigo symptoms (doesn't prevent).  If this doesn't work, we'll go to physical therapy again for vestibular rehab.  Blood pressure needs some work; I'll give some thought to next step and communicate by call or MyChart by tomorrow.  We'll also review your thyroid function results.  Let's get together in 4-6 weeks to see how you're doing.  If you're better and want to cancel the appointment, that's fine - we can move it out.  Take care and stay well,  Dr. Mayford Knife

## 2023-07-02 NOTE — Assessment & Plan Note (Addendum)
 Uncontrolled 153/81 on olmesartan 40 and amlodipine 10, which she took this morning. Additional agent needed. For additional renal protection will select empagliflozin, which may provide a bit of antihypertensive effect as well.  After she completes her recent 52 supply of amlod and olmesart, I'll ill combine the ARB/CCB to reduce pill burden.  She will check Bps at home and  notify my if SBP is not consistently in 130s (currently 140s-150s).

## 2023-07-02 NOTE — Assessment & Plan Note (Signed)
 Positional vertigo sxs have returned; noticed when lying down, standing up, turning head, particularly to R. Queasiness w/o vomiting.  No tinnitus, no headache.  Has experienced gradual hearing loss over time.  SXS reproduced on exam by looking up and to the right, though no nystagmus in any direction.  She mentioned concern about the vagus nerve which she'd been reading about.  For now we will initiate trial of meclizine. If ineffective she may benefit from return to vestibular rehab for Eppley maneuver.

## 2023-07-02 NOTE — Assessment & Plan Note (Signed)
 Meloxicam refilled; she takes one tab daily for arthritic pain.  Caution with renal fxn.  I will check at next visit.

## 2023-07-03 ENCOUNTER — Encounter: Payer: Self-pay | Admitting: Internal Medicine

## 2023-07-03 DIAGNOSIS — N200 Calculus of kidney: Secondary | ICD-10-CM | POA: Insufficient documentation

## 2023-07-03 DIAGNOSIS — D3502 Benign neoplasm of left adrenal gland: Secondary | ICD-10-CM

## 2023-07-03 HISTORY — DX: Benign neoplasm of left adrenal gland: D35.02

## 2023-07-03 LAB — TSH: TSH: 1.72 u[IU]/mL (ref 0.450–4.500)

## 2023-07-03 NOTE — Assessment & Plan Note (Signed)
 Lung cancer screening completed 04/2023; continue annual screen.

## 2023-07-03 NOTE — Assessment & Plan Note (Signed)
 Etiology suspected to be related to HTN and possibly chronic NSAID use. Check renal fxn today. She reports inability to stop daily meloxicam due to severe headaches upon cessation. Will begin empagliflozin for renal protection.

## 2023-07-06 MED ORDER — EMPAGLIFLOZIN 10 MG PO TABS: 10.0000 mg | ORAL_TABLET | Freq: Every day | ORAL | 11 refills | Status: AC

## 2023-07-06 MED ORDER — MECLIZINE HCL 25 MG PO TABS
25.0000 mg | ORAL_TABLET | Freq: Three times a day (TID) | ORAL | 0 refills | Status: AC | PRN
Start: 2023-07-06 — End: ?

## 2023-08-03 ENCOUNTER — Other Ambulatory Visit: Payer: Self-pay | Admitting: Internal Medicine

## 2023-08-03 DIAGNOSIS — Z1231 Encounter for screening mammogram for malignant neoplasm of breast: Secondary | ICD-10-CM

## 2023-09-01 ENCOUNTER — Encounter: Admitting: Internal Medicine

## 2023-09-10 ENCOUNTER — Ambulatory Visit (INDEPENDENT_AMBULATORY_CARE_PROVIDER_SITE_OTHER): Admitting: Internal Medicine

## 2023-09-10 VITALS — BP 114/60 | HR 72 | Temp 97.4°F | Ht 64.0 in | Wt 126.4 lb

## 2023-09-10 DIAGNOSIS — I129 Hypertensive chronic kidney disease with stage 1 through stage 4 chronic kidney disease, or unspecified chronic kidney disease: Secondary | ICD-10-CM | POA: Diagnosis not present

## 2023-09-10 DIAGNOSIS — I10 Essential (primary) hypertension: Secondary | ICD-10-CM

## 2023-09-10 DIAGNOSIS — E785 Hyperlipidemia, unspecified: Secondary | ICD-10-CM

## 2023-09-10 DIAGNOSIS — E890 Postprocedural hypothyroidism: Secondary | ICD-10-CM

## 2023-09-10 DIAGNOSIS — N1831 Chronic kidney disease, stage 3a: Secondary | ICD-10-CM

## 2023-09-10 DIAGNOSIS — M810 Age-related osteoporosis without current pathological fracture: Secondary | ICD-10-CM

## 2023-09-10 DIAGNOSIS — E2839 Other primary ovarian failure: Secondary | ICD-10-CM

## 2023-09-10 MED ORDER — PRAVASTATIN SODIUM 40 MG PO TABS: 40.0000 mg | ORAL_TABLET | Freq: Every day | ORAL | 3 refills | Status: AC

## 2023-09-10 MED ORDER — LEVOTHYROXINE SODIUM 75 MCG PO TABS: 75.0000 ug | ORAL_TABLET | Freq: Every day | ORAL | 3 refills | Status: AC

## 2023-09-10 NOTE — Assessment & Plan Note (Signed)
 BP well controlled with addition of new empagliflozin  to amlodipine  10 and olmesartan  40.  Will now plan to ocmbine the ARB and CCB to reduce pill burden.  Check renal fxn today.

## 2023-09-10 NOTE — Assessment & Plan Note (Signed)
 DEXA ordered today

## 2023-09-10 NOTE — Assessment & Plan Note (Signed)
 Meloxicam  has been reduced from 15 to 7.5 mg daily and Jardiance  started in 06/2023.  Recheck renal fxn today.

## 2023-09-10 NOTE — Assessment & Plan Note (Signed)
 TSH 1.72 in 06/2023 after dose adjustment.  Refilled today at 75 micrograms daily.

## 2023-09-10 NOTE — Progress Notes (Signed)
 78 y.o. Linda [name] is here for routine follow-up of HTN, hypothyroidism, CKD, and osteoporosis. Our last visit was in 06/2023. She is not having any concerns or problems and is here primarily for refills.  We will also be rechecking her renal fxn after Mobic  dose was reduced at previous visit.  She is getting excited about a family trip to [place] next month, where they will board a cruise ship and tour around the [place].  She doesn't plan to disembark due to breathing and ankle conditions.  Breathing rescue plan reviewed.  She will have an ankle injection soon in preparation for the trip.   Patient Active Problem List   Diagnosis Date Noted   Adenoma of left adrenal gland, radiographically benign and stable appearance 07/03/2023   Bilateral nephrolithiasis 07/03/2023   CKD stage 3a, GFR 45-59 ml/min (HCC) 07/02/2023   Unintended weight loss 03/12/2023   Abdominal aortic atherosclerosis (HCC) 12/04/2022   Carpal tunnel syndrome, right 08/29/2021   Irregular bowel habits 08/29/2021   Arthritis of right hand and wrist 08/29/2021   Hearing impairment 03/21/2021   GERD without esophagitis 03/21/2021   BPPV (benign paroxysmal positional vertigo) 06/05/2020   Hypertension 06/05/2020   Former smoker 05/28/2018   Degenerative joint disease of R ankle and foot 12/15/2017   History of breast cancer 07/25/2016   Osteoporosis 05/27/2016   Post-surgical hypothyroidism 05/09/2016   Hx of closed left femoral fracture (HCC) 05/2016 due to fall from upright position groundlevel 05/08/2016   COPD, group D, by GOLD 2017 classification (HCC) 01/28/2011    Current Outpatient Medications:    albuterol  (VENTOLIN  HFA) 108 (90 Base) MCG/ACT inhaler, Inhale 2 puffs into the lungs every 6 (six) hours as needed for wheezing or shortness of breath., Disp: 8 g, Rfl: 2   amLODipine  (NORVASC ) 10 MG tablet, Take 1 tablet (10 mg total) by mouth daily., Disp: 90 tablet, Rfl: 3   aspirin  EC 81 MG tablet, Take 1  tablet (81 mg total) by mouth daily. Swallow whole., Disp: , Rfl:    azithromycin  (ZITHROMAX ) 250 MG tablet, Take 1 tablet (250 mg total) by mouth daily., Disp: 30 tablet, Rfl: 5   Cholecalciferol (VITAMIN D3) 25 MCG (1000 UT) CAPS, Take by mouth., Disp: , Rfl:    empagliflozin  (JARDIANCE ) 10 MG TABS tablet, Take 1 tablet (10 mg total) by mouth daily before breakfast., Disp: 30 tablet, Rfl: 11   Fluticasone -Umeclidin-Vilant (TRELEGY ELLIPTA ) 100-62.5-25 MCG/ACT AEPB, Inhale 1 puff by mouth once daily, Disp: 60 each, Rfl: 10   levothyroxine  (SYNTHROID ) 75 MCG tablet, Take 1 tablet (75 mcg total) by mouth daily before breakfast., Disp: 90 tablet, Rfl: 3   meclizine  (ANTIVERT ) 25 MG tablet, Take 1 tablet (25 mg total) by mouth 3 (three) times daily as needed for dizziness., Disp: 60 tablet, Rfl: 0   meloxicam  (MOBIC ) 7.5 MG tablet, Take 1 tablet (7.5 mg total) by mouth daily., Disp: 90 tablet, Rfl: 3   nicotine  (NICOTROL ) 10 MG inhaler, Nicotrol  10 mg inhalation cartridge  USE 1 CARTRIDGE 16 TIMES A DAY AS NEEDED, Disp: 42 each, Rfl: 3   olmesartan  (BENICAR ) 40 MG tablet, Take 1 tablet (40 mg total) by mouth daily., Disp: 30 tablet, Rfl: 11   omeprazole (PRILOSEC) 20 MG capsule, Take 20 mg by mouth daily before breakfast. , Disp: , Rfl:    Omeprazole-Sodium Bicarbonate (ZEGERID) 20-1100 MG CAPS capsule, Take 1 capsule by mouth daily before breakfast., Disp: , Rfl:    pravastatin  (PRAVACHOL ) 40 MG  tablet, Take 1 tablet (40 mg total) by mouth daily before breakfast., Disp: 90 tablet, Rfl: 3   Probiotic Product (ULTRAFLORA IMMUNE HEALTH PO), Take by mouth., Disp: , Rfl:   Functional Status: Independent, driving, activity limited only by exertional/respiratory tolerance and orthopedic concerns.   Objective BP 114/60 (BP Location: Right Arm, Patient Position: Sitting, Cuff Size: Small)   Pulse 72   Temp (!) 97.4 F (36.3 C) (Oral)   Ht 5' 4 (1.626 m)   Wt 126 lb 6.4 oz (57.3 kg)   SpO2 100%   BMI  21.70 kg/m   Exam: Nicely dressed and groomed with her usual youthful appearance.  Breathing with minimal pursed lip effort.  Affect relaxed.   Problems addressed today:  Post-surgical hypothyroidism TSH 1.72 in 06/2023 after dose adjustment.  Refilled today at 75 micrograms daily.   Osteoporosis DEXA ordered today.    Hypertension BP well controlled with addition of new empagliflozin  to amlodipine  10 and olmesartan  40.  Will now plan to ocmbine the ARB and CCB to reduce pill burden.  Check renal fxn today.  CKD stage 3a, GFR 45-59 ml/min (HCC) Meloxicam  has been reduced from 15 to 7.5 mg daily and Jardiance  started in 06/2023.  Recheck renal fxn today.

## 2023-09-10 NOTE — Patient Instructions (Addendum)
 Ms. Turck,  Have a wonderful trip!  Refills are done.  I'm pleased to hear that you're doing well.  We're getting caught up on some kidney blood work and a DEXA scan has been ordered.  Come and see me in 3-6 months and have a great rest of your summer!  Dr. Broadus Canes

## 2023-09-11 DIAGNOSIS — M19071 Primary osteoarthritis, right ankle and foot: Secondary | ICD-10-CM | POA: Diagnosis not present

## 2023-09-11 LAB — BMP8+ANION GAP
Anion Gap: 19 mmol/L — ABNORMAL HIGH (ref 10.0–18.0)
BUN/Creatinine Ratio: 14 (ref 12–28)
BUN: 22 mg/dL (ref 8–27)
CO2: 20 mmol/L (ref 20–29)
Calcium: 9.7 mg/dL (ref 8.7–10.3)
Chloride: 101 mmol/L (ref 96–106)
Creatinine, Ser: 1.59 mg/dL — ABNORMAL HIGH (ref 0.57–1.00)
Glucose: 88 mg/dL (ref 70–99)
Potassium: 4.5 mmol/L (ref 3.5–5.2)
Sodium: 140 mmol/L (ref 134–144)
eGFR: 33 mL/min/{1.73_m2} — ABNORMAL LOW (ref >59)

## 2023-09-17 ENCOUNTER — Ambulatory Visit
Admission: RE | Admit: 2023-09-17 | Discharge: 2023-09-17 | Disposition: A | Discharge status: Home / Self Care | Source: Ambulatory Visit | Attending: Internal Medicine | Admitting: Internal Medicine

## 2023-09-17 DIAGNOSIS — Z1231 Encounter for screening mammogram for malignant neoplasm of breast: Secondary | ICD-10-CM

## 2023-09-21 ENCOUNTER — Telehealth: Payer: Self-pay | Admitting: Internal Medicine

## 2023-09-21 NOTE — Telephone Encounter (Signed)
 Copied from CRM [id number]. Topic: Referral - Status >> Sep 16, 2023  1:15 PM Tiffany H wrote: Reason for CRM: Patient called to advise that local breast center shut down operations. Patient's closest option is the [facility] location which has an opening, first available for January 2026.   Please update referral to be extended to February 2026.

## 2023-11-23 ENCOUNTER — Other Ambulatory Visit: Payer: Self-pay | Admitting: Internal Medicine

## 2023-11-23 DIAGNOSIS — I10 Essential (primary) hypertension: Secondary | ICD-10-CM

## 2023-11-23 MED ORDER — OLMESARTAN MEDOXOMIL 40 MG PO TABS: 40.0000 mg | ORAL_TABLET | Freq: Every day | ORAL | 1 refills | Status: DC

## 2023-11-23 NOTE — Telephone Encounter (Unsigned)
 Copied from CRM [id number]. Topic: Clinical - Medication Refill >> Nov 23, 2023  9:25 AM Alfonso ORN wrote: Medication: olmesartan  (BENICAR ) 40 MG tablet  Has the patient contacted their pharmacy? Yes (Agent: If no, request that the patient contact the pharmacy for the refill. If patient does not wish to contact the pharmacy document the reason why and proceed with request.) (Agent: If yes, when and what did the pharmacy advise?)  This is the patient's preferred pharmacy:  [facility] [address] [place] - [address] [place] [address] Phone: [phone] Fax: [phone]  Is this the correct pharmacy for this prescription? Yes If no, delete pharmacy and type the correct one.   Has the prescription been filled recently? Yes  Is the patient out of the medication? Yes  , been out since Saturday 11/21/23   Has the patient been seen for an appointment in the last year OR does the patient have an upcoming appointment? Yes  Can we respond through MyChart? Yes  , or can call   Agent: Please be advised that Rx refills may take up to 3 business days. We ask that you follow-up with your pharmacy.

## 2023-12-09 ENCOUNTER — Telehealth: Payer: Self-pay

## 2023-12-09 ENCOUNTER — Encounter: Payer: Self-pay | Admitting: Internal Medicine

## 2023-12-09 ENCOUNTER — Ambulatory Visit: Payer: PPO

## 2023-12-09 ENCOUNTER — Other Ambulatory Visit: Payer: Self-pay | Admitting: Internal Medicine

## 2023-12-09 VITALS — Ht 64.0 in | Wt 128.0 lb

## 2023-12-09 DIAGNOSIS — Z0000 Encounter for general adult medical examination without abnormal findings: Secondary | ICD-10-CM

## 2023-12-09 DIAGNOSIS — I10 Essential (primary) hypertension: Secondary | ICD-10-CM

## 2023-12-09 MED ORDER — OLMESARTAN MEDOXOMIL 40 MG PO TABS: 40.0000 mg | ORAL_TABLET | Freq: Every day | ORAL | 3 refills | Status: DC

## 2023-12-09 NOTE — Patient Instructions (Signed)
 Ms. Fouty,  Thank you for taking the time for your Medicare Wellness Visit. I appreciate your continued commitment to your health goals. Please review the care plan we discussed, and feel free to reach out if I can assist you further.  Medicare recommends these wellness visits once per year to help you and your care team stay ahead of potential health issues. These visits are designed to focus on prevention, allowing your provider to concentrate on managing your acute and chronic conditions during your regular appointments.  Please note that Annual Wellness Visits do not include a physical exam. Some assessments may be limited, especially if the visit was conducted virtually. If needed, we may recommend a separate in-person follow-up with your provider.  Ongoing Care Seeing your primary care provider every 3 to 6 months helps us  monitor your health and provide consistent, personalized care.   Referrals If a referral was made during today's visit and you haven't received any updates within two weeks, please contact the referred provider directly to check on the status.  Recommended Screenings:  Health Maintenance  Topic Date Due   Hepatitis C Screening  Never done   DTaP/Tdap/Td vaccine (2 - Tdap) 12/25/2020   Flu Shot  10/30/2023   COVID-19 Vaccine (4 - 2025-26 season) 11/30/2023   Screening for Lung Cancer  04/05/2024   Medicare Annual Wellness Visit  12/08/2024   Pneumococcal Vaccine for age over 65  Completed   DEXA scan (bone density measurement)  Completed   HPV Vaccine  Aged Out   Meningitis B Vaccine  Aged Out   Zoster (Shingles) Vaccine  Discontinued       12/09/2023   10:36 AM  Advanced Directives  Does Patient Have a Medical Advance Directive? Yes  Type of Estate agent of [place];Living will  Does patient want to make changes to medical advance directive? No - Patient declined  Copy of Healthcare Power of Attorney in Chart? Yes - validated most  recent copy scanned in chart (See row information)   Advance Care Planning is important because it: Ensures you receive medical care that aligns with your values, goals, and preferences. Provides guidance to your family and loved ones, reducing the emotional burden of decision-making during critical moments.  Vision: Annual vision screenings are recommended for early detection of glaucoma, cataracts, and diabetic retinopathy. These exams can also reveal signs of chronic conditions such as diabetes and high blood pressure.  Dental: Annual dental screenings help detect early signs of oral cancer, gum disease, and other conditions linked to overall health, including heart disease and diabetes.  Please see the attached documents for additional preventive care recommendations.

## 2023-12-09 NOTE — Telephone Encounter (Signed)
 Patient stated during AWV today that needed a refill on her Olmesartan  40 mg tablet (90-day supply) to be sent to [facility] on file.  Kathya Wilz N. Tomie, LPN [facility] Annual Wellness Team Direct Dial: [phone]

## 2023-12-09 NOTE — Progress Notes (Signed)
 Because this visit was a virtual/telehealth visit,  certain criteria was not obtained, such a blood pressure, CBG if applicable, and timed get up and go. Any medications not marked as taking were not mentioned during the medication reconciliation part of the visit. Any vitals not documented were not able to be obtained due to this being a telehealth visit or patient was unable to self-report a recent blood pressure reading due to a lack of equipment at home via telehealth. Vitals that have been documented are verbally provided by the patient.   Subjective:   Linda [name] is a 78 y.o. who presents for a Medicare Wellness preventive visit.  As a reminder, Annual Wellness Visits don't include a physical exam, and some assessments may be limited, especially if this visit is performed virtually. We may recommend an in-person follow-up visit with your provider if needed.  Visit Complete: Virtual I connected with  Linda [name] on 12/09/23 by a audio enabled telemedicine application and verified that I am speaking with the correct person using two identifiers.  Patient Location: Home  Provider Location: Office/Clinic  I discussed the limitations of evaluation and management by telemedicine. The patient expressed understanding and agreed to proceed.  Vital Signs: Because this visit was a virtual/telehealth visit, some criteria may be missing or patient reported. Any vitals not documented were not able to be obtained and vitals that have been documented are patient reported.  VideoDeclined- This patient declined Librarian, academic. Therefore the visit was completed with audio only.  Persons Participating in Visit: Patient.  AWV Questionnaire: Yes: Patient Medicare AWV questionnaire was completed by the patient on 12/07/2023; I have confirmed that all information answered by patient is correct and no changes since this date.  Cardiac Risk Factors include:  advanced age (>7men, >26 women);hypertension;sedentary lifestyle;family history of premature cardiovascular disease;dyslipidemia     Objective:    Today's Vitals   12/09/23 1033  Weight: 128 lb (58.1 kg)  Height: 5' 4 (1.626 m)  PainSc: 0-No pain   Body mass index is 21.97 kg/m.     12/09/2023   10:36 AM 04/10/2023    2:53 PM 12/04/2022   10:05 AM 12/03/2022   10:44 AM 10/29/2022    3:32 PM 01/30/2022   10:54 AM 08/29/2021   11:32 AM  Advanced Directives  Does Patient Have a Medical Advance Directive? Yes No Yes Yes Yes No No  Type of Estate agent of [place];Living will  Healthcare Power of [place];Living will Healthcare Power of [place];Living will Living will    Does patient want to make changes to medical advance directive? No - Patient declined  No - Patient declined No - Patient declined No - Patient declined    Copy of Healthcare Power of Attorney in Chart? Yes - validated most recent copy scanned in chart (See row information)  Yes - validated most recent copy scanned in chart (See row information) Yes - validated most recent copy scanned in chart (See row information)     Would patient like information on creating a medical advance directive?       No - Patient declined    Current Medications (verified) Outpatient Encounter Medications as of 12/09/2023  Medication Sig   albuterol  (VENTOLIN  HFA) 108 (90 Base) MCG/ACT inhaler Inhale 2 puffs into the lungs every 6 (six) hours as needed for wheezing or shortness of breath.   amLODipine  (NORVASC ) 10 MG tablet Take 1 tablet (10 mg total) by mouth daily.  azithromycin  (ZITHROMAX ) 250 MG tablet Take 1 tablet (250 mg total) by mouth daily.   Cholecalciferol (VITAMIN D3) 25 MCG (1000 UT) CAPS Take by mouth.   empagliflozin  (JARDIANCE ) 10 MG TABS tablet Take 1 tablet (10 mg total) by mouth daily before breakfast.   Fluticasone -Umeclidin-Vilant (TRELEGY ELLIPTA ) 100-62.5-25 MCG/ACT AEPB Inhale 1 puff by mouth once  daily   levothyroxine  (SYNTHROID ) 75 MCG tablet Take 1 tablet (75 mcg total) by mouth daily before breakfast.   meclizine  (ANTIVERT ) 25 MG tablet Take 1 tablet (25 mg total) by mouth 3 (three) times daily as needed for dizziness.   meloxicam  (MOBIC ) 7.5 MG tablet Take 1 tablet (7.5 mg total) by mouth daily.   nicotine  (NICOTROL ) 10 MG inhaler Nicotrol  10 mg inhalation cartridge  USE 1 CARTRIDGE 16 TIMES A DAY AS NEEDED   olmesartan  (BENICAR ) 40 MG tablet Take 1 tablet (40 mg total) by mouth daily.   omeprazole (PRILOSEC) 20 MG capsule Take 20 mg by mouth daily before breakfast.    Omeprazole-Sodium Bicarbonate (ZEGERID) 20-1100 MG CAPS capsule Take 1 capsule by mouth daily before breakfast.   pravastatin  (PRAVACHOL ) 40 MG tablet Take 1 tablet (40 mg total) by mouth daily before breakfast.   Probiotic Product (ULTRAFLORA IMMUNE HEALTH PO) Take by mouth.   No facility-administered encounter medications on file as of 12/09/2023.    Allergies (verified) Aspirin    History: Past Medical History:  Diagnosis Date   Adenoma of left adrenal gland, radiographically benign and stable appearance 07/03/2023   Arthritis    qwhere; hands, feet, different body parts from time to time (05/08/2016)   Asthma    Breast mass 07/03/2016   Cancer (HCC)    right breast   Complication of anesthesia    they used an adult tube going down my throat; caused alot of problems; was told to always have pediatric tube placed (05/08/2016)   COPD (chronic obstructive pulmonary disease) (HCC)    COVID-19 03/28/2020   GERD (gastroesophageal reflux disease)    Hearing impairment 03/21/2021   ENT eval and hearing aides provided in past, which were unsatisfactory.  At conversational volume she frequently requests something to be repeated.  She would like a second opinion at a completely different ENT practice but is not quite ready to pursue this.    History of breast cancer 07/25/2016   History of hiatal hernia    History  of radiation therapy 10/07/16- 11/17/16   Right Breast and regional nodes 50 Gy in 25 fractions to breast and at least 45 Gy in 25 fractions to nodes, Right Breast boost 10 Gy in 5 fractions.    Hyperglycemia    Hyperlipidemia    Hypertension    Hypothyroidism    Lump of breast, right    suppose to have it checked soon (05/08/2016)   Migraine    none for years (05/08/2016)   Osteopenia    Personal history of radiation therapy 2018   Pneumonia several times   Post-surgical hypothyroidism 05/09/2016   Status post radioactive iodine  thyroid  ablation    Thyroid  disease    Past Surgical History:  Procedure Laterality Date   ANKLE SURGERY Right X 2   fibrocystic tumors come up in my joints   APPENDECTOMY  1963   BREAST BIOPSY Right 07/09/2016   malignant   BREAST LUMPECTOMY Right 08/14/2016   BREAST LUMPECTOMY WITH AXILLARY LYMPH NODE DISSECTION Right 08/14/2016   Procedure: RIGHT BREAST LUMPECTOMY WITH AXILLARY LYMPH NODE DISSECTION;  Surgeon: Aron Shoulders, MD;  Location: [facility] OR;  Service: General;  Laterality: Right;   DILATION AND CURETTAGE OF UTERUS  several   when I was young   INGUINAL HERNIA REPAIR Bilateral 2000   MASS EXCISION Right 08/14/2016   Procedure: EXCISION RIGHT ARM MASS 2X3CM;  Surgeon: Aron Shoulders, MD;  Location: [facility] OR;  Service: General;  Laterality: Right;   POLYPECTOMY     vocal cord   TONSILLECTOMY     TOTAL HIP ARTHROPLASTY Left 05/09/2016   Procedure: TOTAL HIP ARTHROPLASTY ANTERIOR APPROACH;  Surgeon: Donnice Car, MD;  Location: [facility] OR;  Service: Orthopedics;  Laterality: Left;   TUBAL LIGATION     VAGINAL HYSTERECTOMY  1979   Family History  Problem Relation Age of Onset   Hypertension Mother    Stroke Mother    Heart attack Mother    Allergies Mother    Hypertension Father    Lung cancer Father    Diabetes Sister    Heart disease Sister    Lupus Sister    Allergies Daughter    Breast cancer Neg Hx    Social History   Socioeconomic  History   Marital status: Divorced    Spouse name: Not on file   Number of children: 3   Years of education: Not on file   Highest education level: Some college, no degree  Occupational History   Occupation: retired  Tobacco Use   Smoking status: Former    Current packs/day: 0.00    Average packs/day: 0.8 packs/day for 38.0 years (28.5 ttl pk-yrs)    Types: Cigarettes    Start date: 05/01/1980    Quit date: 05/01/2018    Years since quitting: 5.6   Smokeless tobacco: Never   Tobacco comments:    Using Nicotrol  Inhaler from primary care  Vaping Use   Vaping status: Never Used  Substance and Sexual Activity   Alcohol  use: Yes    Comment: Occasionally   Drug use: No   Sexual activity: Not Currently  Other Topics Concern   Not on file  Social History Narrative   Current Social History 10/17/2020        Patient lives alone in a home which is 1 story. There are not steps up to the entrance the patient uses.       Patient's method of transportation is personal car.      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      The patient currently retired.      Identified important Relationships are Daughters       Pets : None, pet recently passed away       Interests / Fun: travel, recently traveled [place]       Current Stressors: none       Religious / Personal Beliefs: I believe in [place]       Other: N/A        Social Drivers of Health   Financial Resource Strain: Low Risk  (12/09/2023)   Overall Financial Resource Strain (CARDIA)    Difficulty of Paying Living Expenses: Not hard at all  Food Insecurity: No Food Insecurity (12/09/2023)   Hunger Vital Sign    Worried About Running Out of Food in the Last Year: Never true    Ran Out of Food in the Last Year: Never true  Transportation Needs: No Transportation Needs (12/09/2023)   PRAPARE - Administrator, Civil Service (Medical): No    Lack of Transportation (Non-Medical):  No  Physical  Activity: Insufficiently Active (12/09/2023)   Exercise Vital Sign    Days of Exercise per Week: 2 days    Minutes of Exercise per Session: 30 min  Stress: No Stress Concern Present (12/09/2023)   [employer] of Occupational Health - Occupational Stress Questionnaire    Feeling of Stress: Not at all  Social Connections: Moderately Integrated (12/09/2023)   Social Connection and Isolation Panel    Frequency of Communication with Friends and Family: More than three times a week    Frequency of Social Gatherings with Friends and Family: Three times a week    Attends Religious Services: More than 4 times per year    Active Member of Clubs or Organizations: Yes    Attends Engineer, structural: More than 4 times per year    Marital Status: Divorced    Tobacco Counseling Counseling given: Not Answered Tobacco comments: Using Nicotrol  Inhaler from primary care    Clinical Intake:  Pre-visit preparation completed: Yes  Pain : No/denies pain Pain Score: 0-No pain     BMI - recorded: 21.97 Nutritional Status: BMI of 19-24  Normal Nutritional Risks: None Diabetes: No  No results found for: HGBA1C   How often do you need to have someone help you when you read instructions, pamphlets, or other written materials from your doctor or pharmacy?: 1 - Never  Interpreter Needed?: No  Information entered by :: Mallie Giambra N. Aliesha Dolata, Linda [name].   Activities of Daily Living     12/09/2023   10:49 AM 12/07/2023   11:11 PM  In your present state of health, do you have any difficulty performing the following activities:  Hearing? 0 0  Vision? 0 0  Difficulty concentrating or making decisions? 0 0  Walking or climbing stairs? 1 1  Dressing or bathing? 0 0  Doing errands, shopping? 0 0  Preparing Food and eating ? N N  Using the Toilet? N N  In the past six months, have you accidently leaked urine? N N  Do you have problems with loss of bowel control? N N  Managing your  Medications? N N  Managing your Finances? N N  Housekeeping or managing your Housekeeping? N N    Patient Care Team: Trudy Mliss Dragon, MD as PCP - General (Internal Medicine) Shelah Lamar RAMAN, MD (Pulmonary Disease) Aron Shoulders, MD as Consulting Physician (Surgical Oncology) Izell Domino, MD as Attending Physician (Radiation Oncology) Odean Potts, MD as Consulting Physician (Hematology and Oncology) Crawford, Morna Pickle, NP as Nurse Practitioner (Hematology and Oncology) Arnaldo Juliene RAMAN, MD as Attending Physician (Family Medicine)  I have updated your Care Teams any recent Medical Services you may have received from other providers in the past year.     Assessment:   This is a routine wellness examination for Linda [name].  Hearing/Vision screen Hearing Screening - Comments:: Patient has adequate hearing. Vision Screening - Comments:: Patient has adequate vision.  Using otc reading glasses for reading.  Not up to date with vision exam.   Goals Addressed             This Visit's Progress    12/09/2023: My goal is to continue to walk and use my ankle support brace everyday top avoid having ankle surgery.         Depression Screen     12/09/2023   10:41 AM 12/04/2022   10:09 AM 12/03/2022   10:49 AM 01/30/2022   11:50 AM 08/29/2021   11:32 AM 03/07/2021  12:15 PM 11/15/2020   10:44 AM  PHQ 2/9 Scores  PHQ - 2 Score 0 0 0 0 0 0 0  PHQ- 9 Score 0  0   0 0    Fall Risk     12/09/2023   10:49 AM 12/07/2023   11:11 PM 12/04/2022   10:04 AM 11/28/2022    8:34 PM 01/30/2022   10:58 AM  Fall Risk   Falls in the past year? 0 0 0 0 0  Number falls in past yr: 0 0 0 0 0  Injury with Fall? 0 0 0 0 0  Risk for fall due to : No Fall Risks      Follow up Falls evaluation completed   Falls evaluation completed;Falls prevention discussed Falls evaluation completed      Data saved with a previous flowsheet row definition    MEDICARE RISK AT HOME:  Medicare Risk at Home Any  stairs in or around the home?: No If so, are there any without handrails?: No Home free of loose throw rugs in walkways, pet beds, electrical cords, etc?: Yes Adequate lighting in your home to reduce risk of falls?: Yes Life alert?: No Use of a cane, walker or w/c?: No Grab bars in the bathroom?: Yes Shower chair or bench in shower?: Yes Elevated toilet seat or a handicapped toilet?: No  TIMED UP AND GO:  Was the test performed?  No  Cognitive Function: Declined/Normal: No cognitive concerns noted by patient or family. Patient alert, oriented, able to answer questions appropriately and recall recent events. No signs of memory loss or confusion.    12/09/2023   10:41 AM  MMSE - Mini Mental State Exam  Not completed: Unable to complete        12/09/2023   10:47 AM 12/03/2022   10:45 AM  6CIT Screen  What Year? 0 points 0 points  What month? 0 points 0 points  What time? 0 points 0 points  Count back from 20 0 points 0 points  Months in reverse 0 points 0 points  Repeat phrase 0 points 0 points  Total Score 0 points 0 points    Immunizations Immunization History  Administered Date(s) Administered   Fluad Quad(high Dose 65+) 01/02/2020, 12/26/2020   INFLUENZA, HIGH DOSE SEASONAL PF 01/12/2014, 01/03/2016, 12/16/2016, 12/12/2017, 12/24/2018   Influenza,inj,Quad PF,6+ Mos 12/30/2014   Influenza-Unspecified 01/03/2023   PFIZER(Purple Top)SARS-COV-2 Vaccination 04/23/2019, 05/15/2019, 12/23/2019   Pneumococcal Conjugate-13 12/26/2010, 01/12/2014   Pneumococcal Polysaccharide-23 04/01/2011   Td 12/26/2010   Zoster Recombinant(Shingrix) 06/15/2019, 08/13/2019    Screening Tests Health Maintenance  Topic Date Due   Hepatitis C Screening  Never done   DTaP/Tdap/Td (2 - Tdap) 12/25/2020   Influenza Vaccine  10/30/2023   COVID-19 Vaccine (4 - 2025-26 season) 11/30/2023   Lung Cancer Screening  04/05/2024   Medicare Annual Wellness (AWV)  12/08/2024   Pneumococcal Vaccine: 50+  Years  Completed   DEXA SCAN  Completed   HPV VACCINES  Aged Out   Meningococcal B Vaccine  Aged Out   Zoster Vaccines- Shingrix  Discontinued    Health Maintenance Items Addressed: Yes Patient aware of current care gaps.  Patient is due for Dtap, Flu, Covid-19 and Hepatitis C Screening.  Additional Screening:  Vision Screening: Recommended annual ophthalmology exams for early detection of glaucoma and other disorders of the eye. Is the patient up to date with their annual eye exam?  No  Who is the provider or what is the  name of the office in which the patient attends annual eye exams? N/A  Dental Screening: Recommended annual dental exams for proper oral hygiene  Community Resource Referral / Chronic Care Management: CRR required this visit?  No   CCM required this visit?  No   Plan:    I have personally reviewed and noted the following in the patient's chart:   Medical and social history Use of alcohol , tobacco or illicit drugs  Current medications and supplements including opioid prescriptions. Patient is not currently taking opioid prescriptions. Functional ability and status Nutritional status Physical activity Advanced directives List of other physicians Hospitalizations, surgeries, and ER visits in previous 12 months Vitals Screenings to include cognitive, depression, and falls Referrals and appointments  In addition, I have reviewed and discussed with patient certain preventive protocols, quality metrics, and best practice recommendations. A written personalized care plan for preventive services as well as general preventive health recommendations were provided to patient.   Linda LOISE Fuller, Linda [name]   [id number]   After Visit Summary: (MyChart) Due to this being a telephonic visit, the after visit summary with patients personalized plan was offered to patient via MyChart   Notes: Nothing significant to report at this time.

## 2023-12-24 ENCOUNTER — Ambulatory Visit: Payer: Self-pay | Admitting: Internal Medicine

## 2023-12-29 ENCOUNTER — Encounter: Payer: Self-pay | Admitting: Internal Medicine

## 2023-12-29 ENCOUNTER — Ambulatory Visit (HOSPITAL_COMMUNITY)
Admission: RE | Admit: 2023-12-29 | Discharge: 2023-12-29 | Disposition: A | Discharge status: Home / Self Care | Source: Ambulatory Visit | Attending: Family Medicine | Admitting: Family Medicine

## 2023-12-29 ENCOUNTER — Ambulatory Visit: Admitting: Internal Medicine

## 2023-12-29 VITALS — BP 135/87 | HR 81 | Temp 97.8°F | Ht 64.0 in | Wt 126.8 lb

## 2023-12-29 DIAGNOSIS — J449 Chronic obstructive pulmonary disease, unspecified: Secondary | ICD-10-CM

## 2023-12-29 DIAGNOSIS — K921 Melena: Secondary | ICD-10-CM | POA: Insufficient documentation

## 2023-12-29 DIAGNOSIS — I499 Cardiac arrhythmia, unspecified: Secondary | ICD-10-CM | POA: Diagnosis not present

## 2023-12-29 DIAGNOSIS — N1831 Chronic kidney disease, stage 3a: Secondary | ICD-10-CM | POA: Diagnosis not present

## 2023-12-29 DIAGNOSIS — I129 Hypertensive chronic kidney disease with stage 1 through stage 4 chronic kidney disease, or unspecified chronic kidney disease: Secondary | ICD-10-CM

## 2023-12-29 DIAGNOSIS — I10 Essential (primary) hypertension: Secondary | ICD-10-CM

## 2023-12-29 DIAGNOSIS — I700 Atherosclerosis of aorta: Secondary | ICD-10-CM | POA: Diagnosis not present

## 2023-12-29 MED ORDER — OLMESARTAN MEDOXOMIL 40 MG PO TABS: 40.0000 mg | ORAL_TABLET | Freq: Every day | ORAL | 3 refills | Status: AC

## 2023-12-29 MED ORDER — AMLODIPINE BESYLATE 10 MG PO TABS: 10.0000 mg | ORAL_TABLET | Freq: Every day | ORAL | 3 refills | Status: AC

## 2023-12-29 NOTE — Assessment & Plan Note (Signed)
 Report of explosive black diarrheal stool (no bismuth or iron products) on 12/27/23 in setting of use of NSAID and ASA 81 mg while on long term daily PPI; no subsequent Bms at time of this visit.  Symptomatically orthostatic without hypotension, though peripheray is warm and pink.  Check BUN, Hg and ferritin today.  Stop ASA and NSAID. I will call her with results and next steps. She didn't feel that she could produce a specimen for heme check today.

## 2023-12-29 NOTE — Progress Notes (Signed)
 78 y.o. Linda [name] is here for routine follow-up of HTN, CKD3a, among other problems listed below .  Since last visit with me on 09/10/23 she received a routine R ankle injection by Dr. Arnaldo prior to her [place] trip, which she and her family enjoyed very much.  One limited episode of recurrent vertigo; otherwise no health concerns during the trip.   Discussed today:  Flu shot - plans to get at [facility] Pnuemonia shot (has had 3 vaccinations, most recent was 10 years ago, told by her pharmacist that she needs another (suspect they mean Prevnar 20; she want's to discuss with pulmonologist tmro). Pulmonology f/u appt tmro:  she is frustrated by breathing limitation of exercise.  Referrals to pulmonary rehab didn't materialize, she will ask again tomorrow.  I had sent two referrals in past couple of years myself. Has been feeling a sensation of pulling  in her right inguinal area (hx of bilat ing hern surg 1980s).  It does not bother her, she feels no protuberant bulge along the area but simply wants to bring it to my attention.  Similar to what has been described intermittently over past few years.  Not exercising; can't get to gym (gym had requested a letter from her doctor which she didn't pursue), weight bearing walking hurts her R ankle, stationary bike is better, feeling generally low and poorly motivated.  In passing mentioned an episode of sudden abdominal cramping 3 days ago (which followed gradual bloating and a few days w/o a BM), relieved with large diarrheal evacuation of black stool.  Sxs had resolved by that evening, and aside from feeling initially weak, she had no sxs such as positional dizziness or increased fatigue.  She has not had a BM since the episode 3 days ago and her abdomen has remained flat.  She does not feel the need to move her bowels. She hasn't had epigastric burning and continues her daily PPI.  No pepto bismol use and no iron supplements. She does take daily  NSAID for her ankle pain (dose reduced at a recent visit given concern for renal fxn) and a daily low dose aspirin  was started last year when lung cancer screening CT showed vascular calcification which was concerning for her.  Hx of bleeding ulcer and gastritis more remote.   Patient Active Problem List   Diagnosis Date Noted   Adenoma of left adrenal gland, radiographically benign and stable appearance 07/03/2023   Bilateral nephrolithiasis 07/03/2023   CKD stage 3a, GFR 45-59 ml/min (HCC) 07/02/2023   Abdominal aortic atherosclerosis 12/04/2022   Carpal tunnel syndrome, right 08/29/2021   Irregular bowel habits 08/29/2021   Arthritis of right hand and wrist 08/29/2021   Hearing impairment 03/21/2021   GERD without esophagitis 03/21/2021   BPPV (benign paroxysmal positional vertigo) 06/05/2020   Hypertension 06/05/2020   Former smoker 05/28/2018   Degenerative joint disease of R ankle and foot 12/15/2017   History of breast cancer 07/25/2016   Osteoporosis 05/27/2016   Post-surgical hypothyroidism 05/09/2016   Hx of closed left femoral fracture (HCC) 05/2016 due to fall from upright position groundlevel 05/08/2016   COPD, group D, by GOLD 2017 classification (HCC) 01/28/2011    Current Outpatient Medications:    albuterol  (VENTOLIN  HFA) 108 (90 Base) MCG/ACT inhaler, Inhale 2 puffs into the lungs every 6 (six) hours as needed for wheezing or shortness of breath., Disp: 8 g, Rfl: 2   amLODipine  (NORVASC ) 10 MG tablet, Take 1 tablet (10 mg total)  by mouth daily., Disp: 90 tablet, Rfl: 3   azithromycin  (ZITHROMAX ) 250 MG tablet, Take 1 tablet (250 mg total) by mouth daily., Disp: 30 tablet, Rfl: 5   Cholecalciferol (VITAMIN D3) 25 MCG (1000 UT) CAPS, Take by mouth., Disp: , Rfl:    empagliflozin  (JARDIANCE ) 10 MG TABS tablet, Take 1 tablet (10 mg total) by mouth daily before breakfast., Disp: 30 tablet, Rfl: 11   Fluticasone -Umeclidin-Vilant (TRELEGY ELLIPTA ) 100-62.5-25 MCG/ACT AEPB,  Inhale 1 puff by mouth once daily, Disp: 60 each, Rfl: 10   levothyroxine  (SYNTHROID ) 75 MCG tablet, Take 1 tablet (75 mcg total) by mouth daily before breakfast., Disp: 90 tablet, Rfl: 3   meclizine  (ANTIVERT ) 25 MG tablet, Take 1 tablet (25 mg total) by mouth 3 (three) times daily as needed for dizziness., Disp: 60 tablet, Rfl: 0   meloxicam  (MOBIC ) 7.5 MG tablet, Take 1 tablet (7.5 mg total) by mouth daily., Disp: 90 tablet, Rfl: 3   nicotine  (NICOTROL ) 10 MG inhaler, Nicotrol  10 mg inhalation cartridge  USE 1 CARTRIDGE 16 TIMES A DAY AS NEEDED, Disp: 42 each, Rfl: 3   olmesartan  (BENICAR ) 40 MG tablet, Take 1 tablet (40 mg total) by mouth daily., Disp: 90 tablet, Rfl: 3   omeprazole (PRILOSEC) 20 MG capsule, Take 20 mg by mouth daily before breakfast. , Disp: , Rfl:    Omeprazole-Sodium Bicarbonate (ZEGERID) 20-1100 MG CAPS capsule, Take 1 capsule by mouth daily before breakfast., Disp: , Rfl:    pravastatin  (PRAVACHOL ) 40 MG tablet, Take 1 tablet (40 mg total) by mouth daily before breakfast., Disp: 90 tablet, Rfl: 3   Probiotic Product (ULTRAFLORA IMMUNE HEALTH PO), Take by mouth., Disp: , Rfl:   Functional Status: Independent in all ADLs and IADLs. Enjoys local and international travel with family, no assistive device, keeps R ankle braced, drives.    Objective BP (!) 145/66 (BP Location: Left Arm, Patient Position: Sitting)   Pulse 84   Temp 97.8 F (36.6 C) (Oral)   Ht 5' 4 (1.626 m)   Wt 126 lb 12.8 oz (57.5 kg)   SpO2 98%   BMI 21.77 kg/m   Exam: Good color in periphery, pink fingers and palms, warm. Skin turgor is very reduced.  Scattered areas of bruising on arms, legs. No carotid bruits.  Heart irreg irreg no murmur.  Asymptomatic.  Lungs with dry sounding posterior BLL rales; chronic pursed lip breathing without increase effort. No LE edema.  R ankle in brace.    Assessment and Plan:  Irregular heart beat Assessment & Plan: Incidental on exam, asymptomatic, EKG with  PVCs. Monitor.  Orders: -     EKG 12-Lead  CKD stage 3a, GFR 45-59 ml/min (HCC) Assessment & Plan: Creatinine had risen from 1.27 to 1.59 when checked on 09/10/2023.  She has been started on empagliflozin  for renal protection, which she would like to stop as it causes increased urination.  Will need to continue this medicine, repeat her renal function today, and may need to make the difficult decision to discontinue her NSAID altogether if there has been no improvement.  She has been taking this long-term for both function-limiting right ankle pain and for frequent headaches-the last time we tried to taper it off she had a recurrence of daily headaches.  She also appears to be volume depleted by physical exam and maintaining hydration is discussed as a way that she can help protect her kidney function. Orders: -     Basic metabolic panel with GFR  Primary hypertension -     amLODIPine  Besylate; Take 1 tablet (10 mg total) by mouth daily.  Dispense: 90 tablet; Refill: 3       -     Olmesartan  Medoxomil; Take 1 tablet (40 mg total) by mouth daily.  Dispense: 90 tablet; Refill: 3  Complaint of melena Assessment & Plan: Report of explosive black diarrheal stool (no bismuth or iron products) on 12/27/23 in setting of use of NSAID and ASA 81 mg while on long term daily PPI; no subsequent Bms at time of this visit.  Symptomatically orthostatic without hypotension, though periphery is warm and pink.  Check BUN, Hg and ferritin today.  Stop ASA and NSAID. I will call her with results and next steps. She didn't feel that she could produce a specimen for heme check today. -     CBC -     Ferritin  Abdominal aortic atherosclerosis, asymptomatic Assessment & Plan: Asymptomatic.  Given the possible melena and her dislike of bruising, the ASA will be stopped as risks outweigh benefits.   COPD, group D, by GOLD 2017 classification [facility]) Assessment & Plan: Dyspnea unfortunately limits her ability to do  desired exercise.  No recent exacerbation.  Pulmonary f/u tmro.  She will ask again about pulmonary rehab which she'd like to pursue (I encourage this). She will also ask their opinion on repeating a pneumonia vaccination (most recent was 10 years ago.  Former smoker: Screening CT neg for suspicious lesion 04/2023.

## 2023-12-29 NOTE — Assessment & Plan Note (Signed)
 Creatinine had risen from 1.27 to 1.59 when checked on 09/10/2023.  She has been started on empagliflozin  for renal protection, which she would like to stop as it causes increased urination.  Will need to continue this medicine, repeat her renal function today, and may need to make the difficult decision to discontinue her NSAID altogether if there has been no improvement.  She has been taking this long-term for both function-limiting right ankle pain and for frequent headaches-the last time we tried to taper it off she had a recurrence of daily headaches.  She also appears to be volume depleted by physical exam and maintaining hydration is discussed as a way that she can help protect her kidney function.

## 2023-12-29 NOTE — Assessment & Plan Note (Addendum)
 Dyspnea unfortunately limits her ability to do desired exercise.  No recent exacerbation.  Pulmonary f/u tmro.  She will ask again about pulmonary rehab which she'd like to pursue (I encourage this). She will also ask their opinion on repeating a pneumonia vaccination (most recent was 10 years ago.  Former smoker: Screening CT neg for suspicious lesion 04/2023.

## 2023-12-29 NOTE — Assessment & Plan Note (Signed)
 Incidental on exam, asymptomatic, EKG with PVCs. Monitor.

## 2023-12-29 NOTE — Patient Instructions (Addendum)
 Linda [name],  Given your black colored stools, we are looking into whether you could be having bleeding from the inside of your upper digestive system (digested blood can turn stools black).  We are checking a blood count and iron level.  We also did an EKG to evaluate your irregular heart beat, as this is something new for you.  I want you to stop both your aspirin  and the Mobic  for now until we have more information; take aceteminophen/Tylenol  for now.  Ill be in touch with next steps.  Please continue the empagliflozin .  It helps protect the kidney function from worsening.  Keeping yourself hydrated and minimizing use of antiinflammatory medicines are things you can do to protect your kidneys.  This isn't going to be easy with your ankle pain!  Please talk to your pulmonologist about the pulmonary rehab  program.  I think it would do you good!  Let's get together soon - 3 months at the latest, I'll know more when we get your tests back.  Take care and stay well,  Dr. Trudy

## 2023-12-29 NOTE — Assessment & Plan Note (Signed)
 Asymptomatic.  Given the possible melena and her dislike of bruising, the ASA will be stopped as risks outweigh benefits.

## 2023-12-30 ENCOUNTER — Ambulatory Visit: Payer: Self-pay | Admitting: Internal Medicine

## 2023-12-30 ENCOUNTER — Ambulatory Visit: Admitting: Pulmonary Disease

## 2023-12-30 ENCOUNTER — Encounter: Payer: Self-pay | Admitting: Pulmonary Disease

## 2023-12-30 VITALS — BP 130/86 | HR 83 | Temp 97.6°F | Ht 64.0 in | Wt 127.0 lb

## 2023-12-30 DIAGNOSIS — J449 Chronic obstructive pulmonary disease, unspecified: Secondary | ICD-10-CM | POA: Diagnosis not present

## 2023-12-30 DIAGNOSIS — R918 Other nonspecific abnormal finding of lung field: Secondary | ICD-10-CM | POA: Diagnosis not present

## 2023-12-30 DIAGNOSIS — R0609 Other forms of dyspnea: Secondary | ICD-10-CM | POA: Diagnosis not present

## 2023-12-30 DIAGNOSIS — Z23 Encounter for immunization: Secondary | ICD-10-CM | POA: Diagnosis not present

## 2023-12-30 DIAGNOSIS — N189 Chronic kidney disease, unspecified: Secondary | ICD-10-CM

## 2023-12-30 LAB — BASIC METABOLIC PANEL WITH GFR
BUN/Creatinine Ratio: 19 (ref 12–28)
BUN: 26 mg/dL (ref 8–27)
CO2: 20 mmol/L (ref 20–29)
Calcium: 10.2 mg/dL (ref 8.7–10.3)
Chloride: 100 mmol/L (ref 96–106)
Creatinine, Ser: 1.4 mg/dL — ABNORMAL HIGH (ref 0.57–1.00)
Glucose: 94 mg/dL (ref 70–99)
Potassium: 4.6 mmol/L (ref 3.5–5.2)
Sodium: 139 mmol/L (ref 134–144)
eGFR: 39 mL/min/1.73 — ABNORMAL LOW (ref >59)

## 2023-12-30 LAB — CBC
Hematocrit: 47.9 % — ABNORMAL HIGH (ref 34.0–46.6)
Hemoglobin: 15.1 g/dL (ref 11.1–15.9)
MCH: 29.3 pg (ref 26.6–33.0)
MCHC: 31.5 g/dL (ref 31.5–35.7)
MCV: 93 fL (ref 79–97)
Platelets: 264 x10E3/uL (ref 150–450)
RBC: 5.15 x10E6/uL (ref 3.77–5.28)
RDW: 12.4 % (ref 11.7–15.4)
WBC: 7.1 x10E3/uL (ref 3.4–10.8)

## 2023-12-30 LAB — FERRITIN: Ferritin: 35 ng/mL (ref 15–150)

## 2023-12-30 NOTE — Progress Notes (Signed)
 Linda [name]    [id number]    10-23-1945  Primary Care Physician:Williams, Mliss Dragon, MD  Referring Physician: Trudy Mliss Dragon, MD [address] [place], [address] [place],  [place] [address]  Chief complaint:   Follow up for  COPD GOLD D  HPI: Linda [name] is a 78 year-old with COPD (GOLD D, CAT score 25, multiple exacerbation over past year, FEV1 41%). Maintained on Spiriva  and Symbicort . His symptoms have been stable until this year when she has several exacerbations over the winter requiring courses of antibiotics and steroids. Her primary care physician has referred her back to us  for further management. Her chief complaint is dyspnea on exertion, loss of energy, fatigue,  chronic cough with sputum production.   She's had a diagnosis of breast cancer in April 2018 and underwent lumpectomy and XRT. She declined chemotherapy. Stopped smoking in January 2020.  She is admitted with recurrent exacerbations in 2020 requiring multiple courses of antibiotics and steroids Started on daily azithromycin  in July 2020 and nebulizer therapy  Interim History Discussed the use of AI scribe software for clinical note transcription with the patient, who gave verbal consent to proceed.  History of Present Illness Linda [name] is a 78 year old female with severe COPD who presents for a follow-up visit.  Dyspnea and exercise intolerance - Severe chronic obstructive pulmonary disease (COPD) with persistent dyspnea - Difficulty exercising due to both ankle and respiratory limitations - Recent wheezing, attributed to lack of physical activity - No significant COPD exacerbations recently  Pulmonary imaging findings - Recent lung cancer screening CT demonstrated emphysema, bronchial inflammation, and small lung nodules - Concern regarding potential malignant transformation of lung nodules, especially in the context of significant smoking history  Pharmacologic therapy for  copd - Current regimen includes Trelegy and azithromycin  250 mg three times weekly  Immunization concerns - Concern about receiving influenza and pneumococcal vaccinations due to severe COPD and perceived infection risk - Typically receives influenza vaccination in October and is considering vaccination today if available  Pulmonary rehabilitation barriers - Previous attempt to participate in pulmonary rehabilitation was unsuccessful due to mask requirements during the COVID-19 pandemic    Outpatient Encounter Medications as of 12/30/2023  Medication Sig   albuterol  (VENTOLIN  HFA) 108 (90 Base) MCG/ACT inhaler Inhale 2 puffs into the lungs every 6 (six) hours as needed for wheezing or shortness of breath.   amLODipine  (NORVASC ) 10 MG tablet Take 1 tablet (10 mg total) by mouth daily.   azithromycin  (ZITHROMAX ) 250 MG tablet Take 1 tablet (250 mg total) by mouth daily.   Cholecalciferol (VITAMIN D3) 25 MCG (1000 UT) CAPS Take by mouth.   empagliflozin  (JARDIANCE ) 10 MG TABS tablet Take 1 tablet (10 mg total) by mouth daily before breakfast.   Fluticasone -Umeclidin-Vilant (TRELEGY ELLIPTA ) 100-62.5-25 MCG/ACT AEPB Inhale 1 puff by mouth once daily   levothyroxine  (SYNTHROID ) 75 MCG tablet Take 1 tablet (75 mcg total) by mouth daily before breakfast.   meclizine  (ANTIVERT ) 25 MG tablet Take 1 tablet (25 mg total) by mouth 3 (three) times daily as needed for dizziness.   nicotine  (NICOTROL ) 10 MG inhaler Nicotrol  10 mg inhalation cartridge  USE 1 CARTRIDGE 16 TIMES A DAY AS NEEDED   olmesartan  (BENICAR ) 40 MG tablet Take 1 tablet (40 mg total) by mouth daily.   Omeprazole-Sodium Bicarbonate (ZEGERID) 20-1100 MG CAPS capsule Take 1 capsule by mouth daily before breakfast.   pravastatin  (PRAVACHOL ) 40 MG tablet  Take 1 tablet (40 mg total) by mouth daily before breakfast.   meloxicam  (MOBIC ) 7.5 MG tablet Take 1 tablet (7.5 mg total) by mouth daily. (Patient not taking: Reported on 12/30/2023)    [DISCONTINUED] Probiotic Product (ULTRAFLORA IMMUNE HEALTH PO) Take by mouth.   No facility-administered encounter medications on file as of 12/30/2023.   Vitals:   12/30/23 1405  BP: 130/86  Pulse: 83  Temp: 97.6 F (36.4 C)  Height: 5' 4 (1.626 m)  Weight: 127 lb (57.6 kg)  SpO2: 97%  TempSrc: Oral  BMI (Calculated): 21.79     Physical Exam GEN: No acute distress. CV: Regular rate and rhythm, no murmurs. LUNGS: Clear to auscultation bilaterally, normal respiratory effort. SKIN JOINTS: Warm and dry, no rash.    Data Reviewed: Imaging CT chest 02/26/15- mild left lower lobe atelectasis, moderate emphysematous changes.  CT chest 08/01/16- moderate emphysematous changes.  No suspicious lung nodules or mass.  Chronic scarring in the right lower lobe. Chest x-ray 08/10/2019-emphysema, no acute cardiopulmonary abnormality. Chest x-ray 12/26/2020-emphysematous changes with no acute cardiopulmonary abnormality. CT abdomen pelvis 10/29/2022-possible scarring of the right base CT renal 11/10/2022-emphysema with right base linear scarring/atelectasis Screening CT chest 04/06/2023- Diffuse bronchial wall thickening with emphysema.  Subcentimeter pulmonary nodules up to 2.9 mm I have reviewed the images personally  PFTs  02/18/11 FVC 2.42 [117%), FEV1 1.46 (69%), F/F 46, TLC 112%, DLCO 69% Moderate obstructive defect, mild reduction in diffusion capacity.   08/23/15 FVC 2.14 [71%], FEV1 0.94 [41%], F/F 44, TLC 114%, DLCO 57% Severe obstructive defect with moderate reduction in diffusion capacity  Labs: Alpha-1 antitrypsin 10/05/18-205, PI MM   Assessment & Plan Severe chronic obstructive pulmonary disease with emphysema and bronchial inflammation Reports increased wheezing and decreased exercise tolerance. Currently on Trelegy and azithromycin  250 mg three times a week, which has reduced the frequency of exacerbations. Lung cancer screening CT shows emphysema, bronchial inflammation, and  small lung nodules. Discussed the potential benefit of pulmonary rehabilitation and a new nebulizer medication, Othtuvayre to improve symptoms.  She is unable to afford Daliresp  - Refer to pulmonary rehabilitation program - Prescribe Ohtuvayre  nebulizer pending insurance approval -Continue azithromycin  50 mg 3 times a week - Administer flu vaccine  Pulmonary nodules Small lung nodules noted on recent CT scan. Unlikely to be malignant given her size, but surveillance is recommended due to smoking history. - Continue surveillance of pulmonary nodules  Chronic kidney disease With stable creatinine levels. Recent creatinine level is 1.40, improved from 1.59 three months ago.  Plan/Recommendations: - Continue Trelegy - Chronic  azithromycin  3 times/week - Low dose screening CT - Albuterol , duo nebs  - Flu vaccination  Lonna Coder MD [facility] 12/30/2023, 2:18 PM  CC: Trudy Mliss Dragon, MD

## 2023-12-30 NOTE — Progress Notes (Signed)
 Linda [name],  Your blood work was very reassuring.   Kidney function has improved with the lower dose of meloxicam .  It isn't back to normal though, and I would still recommend that you stop taking it altogether (or at least just once in awhile). This is safer for your kidneys, and safer for your stomach. No evidence that the black diarrheal spell on Saturday caused a significant blood loss.  Your red blood cell count is normal and your body has an adequate amount of iron, which is used to make blood cells.  Let's continue with our plan of stopping both the aspirin  and the meloxicam .  If you have any further spells of black stools, please make an appointment (or if you're lightheaded like you may faint, go to the emergency room; sometimes a person can loose a lot of blood through the digestive system).  It was good to see you yesterday!  Don't forget to get your flu shot :) Dr. Trudy

## 2023-12-30 NOTE — Patient Instructions (Signed)
  VISIT SUMMARY: Today, we discussed your severe COPD, recent imaging findings, and current treatment plan. We also addressed your concerns about vaccinations and pulmonary rehabilitation.  YOUR PLAN: SEVERE CHRONIC OBSTRUCTIVE PULMONARY DISEASE (COPD): You have severe COPD with emphysema and bronchial inflammation, causing increased wheezing and decreased exercise tolerance. -Continue taking Trelegy and azithromycin  250 mg three times a week. -We will refer you to a pulmonary rehabilitation program to help improve your symptoms. -We will prescribe Ohtuvayre  nebulizer medication, pending insurance approval. -If available, you will receive the flu vaccine today.  PULMONARY NODULES: Small lung nodules were found on your recent CT scan. They are unlikely to be malignant but will be monitored due to your smoking history. -We will continue to monitor the pulmonary nodules with regular surveillance.  CHRONIC KIDNEY DISEASE: Your chronic kidney disease is well-managed, and your recent creatinine levels have improved. -Continue with your current management plan for chronic kidney disease.

## 2024-01-04 ENCOUNTER — Telehealth (HOSPITAL_COMMUNITY): Payer: Self-pay

## 2024-01-04 NOTE — Telephone Encounter (Signed)
 Received referral from Dr. Theophilus for this pt to participate in Pulmonary Rehab with the diagnosis of COPD. Pt is interested in scheduling. Will forward to support staff for scheduling and verification of insurance eligibility/benefits with pt consent.   [facility]

## 2024-01-05 ENCOUNTER — Telehealth: Payer: Self-pay

## 2024-01-05 NOTE — Telephone Encounter (Signed)
 Received Ohtuvayre  new start paperwork. Completed form and faxed with clinicals and insurance card copy to [facility]   Phone#: [phone] Fax#: [phone]

## 2024-01-06 ENCOUNTER — Telehealth (HOSPITAL_COMMUNITY): Payer: Self-pay

## 2024-01-06 NOTE — Telephone Encounter (Signed)
 Received fax from [employer] confirming receipt of Ohtuvayre  enrollment form  Patient ID: [id number]  Sherry Pennant, PharmD, MPH, BCPS, CPP Clinical Pharmacist [facility])

## 2024-01-06 NOTE — Telephone Encounter (Signed)
 Pt insurance is active and benefits verified through [employer]. Co-pay $10, DED $0/$0 met, out of pocket $3,200/$300 met, co-insurance 0%. No pre-authorization required. 01/06/2024 @ 3:13pm, REF# [id number].

## 2024-01-07 NOTE — Telephone Encounter (Signed)
 Received fax from [employer] with summary of benefits. Referral form for Ohtuvayre  received. Rx will be triaged to [facility].. Once benefits investigation completed, pharmacy will reach out the patient to schedule shipment. If medication is unaffordable, patient will need to express financial hardship to be referred back to [facility] for patient assistance program pre-screening.   Patient ID: [id number] Pharmacy phone: [phone] [facility] Phone#: [phone]

## 2024-01-07 NOTE — Telephone Encounter (Deleted)
 Received fax from [employer] with summary of benefits. Referral form for Ohtuvayre  received. Rx will be triaged to [facility].. Once benefits investigation completed, pharmacy will reach out the patient to schedule shipment. If medication is unaffordable, patient will need to express financial hardship to be referred back to [facility] for patient assistance program pre-screening.   Patient ID: [id number] Pharmacy phone: [phone] [facility] Phone#: [phone]

## 2024-01-08 ENCOUNTER — Telehealth (HOSPITAL_COMMUNITY): Payer: Self-pay

## 2024-01-08 NOTE — Telephone Encounter (Signed)
 Received fax from [name] that pa has been started for Ohtuvayre . Submitted a Prior Authorization request to [facility] [employer] for OHTUVAYRE  via CoverMyMeds. Will update once we receive a response.  Key: [id number]

## 2024-01-08 NOTE — Telephone Encounter (Signed)
 Scheduled pt for [facility]

## 2024-01-08 NOTE — Telephone Encounter (Signed)
 Called pt and scheduled for Pulmonary Rehab. Orientation appt set for 01/29/2024 at 1pm. Pt will be in the 1:15pm class on Tuesdays and Thursdays, with first exercise date on 11/6. Appointments scheduled and message sent through MyChart.

## 2024-01-11 ENCOUNTER — Other Ambulatory Visit (HOSPITAL_COMMUNITY): Payer: Self-pay

## 2024-01-11 NOTE — Telephone Encounter (Signed)
 Received notification from [employer] regarding a prior authorization for OHTUVAYRE . Authorization has been APPROVED from 01/08/24 to 03/30/25. Approval letter sent to scan center.  Per test claim, copay for 30 days supply is $565.74  Authorization # [id number] Phone # [phone]  Faxed approval letter to [employer] and [facility]. Pt must express financial hardship if copay is unaffordable to be referred back to [employer] for patient assistance screening.

## 2024-01-12 ENCOUNTER — Telehealth: Payer: Self-pay

## 2024-01-12 NOTE — Telephone Encounter (Signed)
 I believe she is referring to Ohtuvayre  nebulizer There is no good alternative to it.  Please forward message to pharmacy to see if patient assistance is possible.

## 2024-01-12 NOTE — Telephone Encounter (Signed)
 Copied from CRM #[id number]. Topic: Clinical - Prescription Issue >> Jan 12, 2024 11:51 AM Devaughn RAMAN wrote: Reason for CRM: Patient called regarding a prescription she was prescribed for a  for nebulizer. Patient stated it was a $500 copay amd she cannot afford it and she would like a alternative.  Dr Theophilus, please advise.

## 2024-01-13 NOTE — Telephone Encounter (Signed)
 Message forwarded to pharmacy team. NFN

## 2024-01-13 NOTE — Telephone Encounter (Signed)
 Sent pt a message advising her to contact [facility] to see if she's eligible for patient assistance.

## 2024-01-19 ENCOUNTER — Ambulatory Visit: Admitting: Pulmonary Disease

## 2024-01-22 ENCOUNTER — Ambulatory Visit: Admitting: Pulmonary Disease

## 2024-01-27 NOTE — Telephone Encounter (Signed)
 Patient advised to contact [employer] For patient assistance screening (see telephone encounter from 01/13/2024)

## 2024-01-28 ENCOUNTER — Telehealth (HOSPITAL_COMMUNITY): Payer: Self-pay

## 2024-01-28 NOTE — Telephone Encounter (Signed)
 Called to confirm appt. Pt confirmed appt. Instructed pt on proper footwear. Gave directions along with department number.

## 2024-01-29 ENCOUNTER — Encounter (HOSPITAL_COMMUNITY)
Admission: RE | Admit: 2024-01-29 | Discharge: 2024-01-29 | Disposition: A | Discharge status: Home / Self Care | Source: Ambulatory Visit | Attending: Pulmonary Disease | Admitting: Pulmonary Disease

## 2024-01-29 ENCOUNTER — Encounter (HOSPITAL_COMMUNITY): Payer: Self-pay

## 2024-01-29 ENCOUNTER — Ambulatory Visit (HOSPITAL_COMMUNITY)
Admission: EM | Admit: 2024-01-29 | Discharge: 2024-01-29 | Disposition: A | Discharge status: Home / Self Care | Attending: Emergency Medicine | Admitting: Emergency Medicine

## 2024-01-29 VITALS — BP 138/60 | HR 89 | Wt 126.5 lb

## 2024-01-29 DIAGNOSIS — J449 Chronic obstructive pulmonary disease, unspecified: Secondary | ICD-10-CM | POA: Insufficient documentation

## 2024-01-29 DIAGNOSIS — B9689 Other specified bacterial agents as the cause of diseases classified elsewhere: Secondary | ICD-10-CM

## 2024-01-29 DIAGNOSIS — J019 Acute sinusitis, unspecified: Secondary | ICD-10-CM | POA: Diagnosis not present

## 2024-01-29 MED ORDER — METHYLPREDNISOLONE 8 MG PO TABS: 16.0000 mg | ORAL_TABLET | Freq: Every day | ORAL | 0 refills | Status: AC

## 2024-01-29 MED ORDER — CEFPODOXIME PROXETIL 200 MG PO TABS: 200.0000 mg | ORAL_TABLET | Freq: Two times a day (BID) | ORAL | 0 refills | Status: AC

## 2024-01-29 NOTE — ED Triage Notes (Signed)
 Pt c/o sinus pressure and headache x1wk. States took sinus relief meds with no relief.

## 2024-01-29 NOTE — Progress Notes (Signed)
 Linda [name] 78 y.o. female Pulmonary Rehab Orientation Note This patient who was referred to Pulmonary Rehab by Dr. Theophilus with the diagnosis of COPD 3 arrived today in Cardiac and Pul[facility] Rehab. She  arrived ambulatory with normal gait. She  does not carry portable oxygen . Per patient, Jalina uses oxygen  never. Color good, skin warm and dry. Patient is oriented to time and place. Patient's medical history, psychosocial health, and medications reviewed. Psychosocial assessment reveals patient lives with alone. Linda [name] is currently retired. Patient hobbies include reading, gardening, and shopping. Patient reports her stress level is low. Patient does not exhibit signs of depression. PHQ2/9 score 0/0. Chia shows good  coping skills with positive outlook on life. Offered emotional support and reassurance. Will continue to monitor. Physical assessment performed by Nurse pick: Ronal Levin RN. Please see their orientation physical assessment note. Linda [name] reports she does take medications as prescribed. Patient states she follows a regular  diet. The patient reports no specific efforts to gain or lose weight.. Patient's weight will be monitored closely. Demonstration and practice of PLB using pulse oximeter. Linda [name] able to return demonstration satisfactorily. Safety and hand hygiene in the exercise area reviewed with patient. Linda [name] voices understanding of the information reviewed. Department expectations discussed with patient and achievable goals were set. The patient shows enthusiasm about attending the program and we look forward to working with Linda. Linda [name] completed a 6 min walk test today and is scheduled to begin exercise on 02/04/24.   [id number] Linda [name], BSRT

## 2024-01-29 NOTE — Progress Notes (Signed)
 Pulmonary Individual Treatment Plan  Patient Details  Name: Linda [name] MRN: [id number] Date of Birth: [birth year] Referring Provider:   Conrad Ports Pulmonary Rehab Walk Test from 01/29/2024 in [facility]  Referring Provider Mannam    Initial Encounter Date:  Flowsheet Row Pulmonary Rehab Walk Test from 01/29/2024 in [facility]  Date 01/29/24    Visit Diagnosis: Stage 3 severe COPD by GOLD classification (HCC)  Patient's Home Medications on Admission:   Current Outpatient Medications:    albuterol  (VENTOLIN  HFA) 108 (90 Base) MCG/ACT inhaler, Inhale 2 puffs into the lungs every 6 (six) hours as needed for wheezing or shortness of breath., Disp: 8 g, Rfl: 2   amLODipine  (NORVASC ) 10 MG tablet, Take 1 tablet (10 mg total) by mouth daily., Disp: 90 tablet, Rfl: 3   azithromycin  (ZITHROMAX ) 250 MG tablet, Take 1 tablet (250 mg total) by mouth daily. (Patient taking differently: Take 250 mg by mouth 3 (three) times a week.), Disp: 30 tablet, Rfl: 5   Cholecalciferol (VITAMIN D3) 25 MCG (1000 UT) CAPS, Take by mouth., Disp: , Rfl:    empagliflozin  (JARDIANCE ) 10 MG TABS tablet, Take 1 tablet (10 mg total) by mouth daily before breakfast., Disp: 30 tablet, Rfl: 11   Fluticasone -Umeclidin-Vilant (TRELEGY ELLIPTA ) 100-62.5-25 MCG/ACT AEPB, Inhale 1 puff by mouth once daily, Disp: 60 each, Rfl: 10   levothyroxine  (SYNTHROID ) 75 MCG tablet, Take 1 tablet (75 mcg total) by mouth daily before breakfast., Disp: 90 tablet, Rfl: 3   meclizine  (ANTIVERT ) 25 MG tablet, Take 1 tablet (25 mg total) by mouth 3 (three) times daily as needed for dizziness., Disp: 60 tablet, Rfl: 0   nicotine  (NICOTROL ) 10 MG inhaler, Nicotrol  10 mg inhalation cartridge  USE 1 CARTRIDGE 16 TIMES A DAY AS NEEDED, Disp: 42 each, Rfl: 3   olmesartan  (BENICAR ) 40 MG tablet, Take 1 tablet (40 mg total) by  mouth daily., Disp: 90 tablet, Rfl: 3   Omeprazole-Sodium Bicarbonate (ZEGERID) 20-1100 MG CAPS capsule, Take 1 capsule by mouth daily before breakfast., Disp: , Rfl:    pravastatin  (PRAVACHOL ) 40 MG tablet, Take 1 tablet (40 mg total) by mouth daily before breakfast., Disp: 90 tablet, Rfl: 3   meloxicam  (MOBIC ) 7.5 MG tablet, Take 1 tablet (7.5 mg total) by mouth daily. (Patient not taking: Reported on 01/29/2024), Disp: 90 tablet, Rfl: 3  Past Medical History: Past Medical History:  Diagnosis Date   Adenoma of left adrenal gland, radiographically benign and stable appearance 07/03/2023   Age-related osteoporosis without current pathological fracture 05/27/2016   Osteopenic by DEXA 2018, though hx of femur fracture due to fall from stand (trip) while at gym, 05/2016.      Arthritis    qwhere; hands, feet, different body parts from time to time (05/08/2016)   Asthma    Breast mass 07/03/2016   Cancer (HCC)    right breast   Complication of anesthesia    they used an adult tube going down my throat; caused alot of problems; was told to always have pediatric tube placed (05/08/2016)   COPD (chronic obstructive pulmonary disease) (HCC)    COVID-19 03/28/2020   GERD (gastroesophageal reflux disease)    Hearing impairment 03/21/2021   ENT eval and hearing aides provided in past, which were unsatisfactory.  At conversational volume she frequently requests something to be repeated.  She would like a second opinion at a  completely different ENT practice but is not quite ready to pursue this.    History of breast cancer 07/25/2016   History of hiatal hernia    History of radiation therapy 10/07/16- 11/17/16   Right Breast and regional nodes 50 Gy in 25 fractions to breast and at least 45 Gy in 25 fractions to nodes, Right Breast boost 10 Gy in 5 fractions.    Hyperglycemia    Hyperlipidemia    Hypertension    Hypothyroidism    Lump of breast, right    suppose to have it checked soon (05/08/2016)    Migraine    none for years (05/08/2016)   Osteopenia    Personal history of radiation therapy 2018   Pneumonia several times   Post-surgical hypothyroidism 05/09/2016   Status post radioactive iodine  thyroid  ablation    Thyroid  disease     Tobacco Use: Social History   Tobacco Use  Smoking Status Former   Current packs/day: 0.00   Average packs/day: 0.8 packs/day for 38.0 years (28.5 ttl pk-yrs)   Types: Cigarettes   Start date: 05/01/1980   Quit date: 05/01/2018   Years since quitting: 5.7  Smokeless Tobacco Never  Tobacco Comments   Using Nicotrol  Inhaler from primary care    Labs: Review Flowsheet       Latest Ref Rng & Units 07/26/2020 05/06/2022 10/29/2022 03/12/2023  Labs for ITP Cardiac and Pulmonary Rehab  Cholestrol 100 - 199 mg/dL 825  845  - 834   LDL (calc) 0 - 99 mg/dL 92  93  - 91   HDL-C >60 mg/dL 39  40  - 43   Trlycerides 0 - 149 mg/dL 743  884  - 819   TCO2 22 - 32 mmol/L - - 23  -    Capillary Blood Glucose: No results found for: GLUCAP   Pulmonary Assessment Scores:  Pulmonary Assessment Scores     Row Name 01/29/24 1310         ADL UCSD   ADL Phase Entry     SOB Score total 38       CAT Score   CAT Score 25       mMRC Score   mMRC Score 3       UCSD: Self-administered rating of dyspnea associated with activities of daily living (ADLs) 6-point scale (0 = not at all to 5 = maximal or unable to do because of breathlessness)  Scoring Scores range from 0 to 120.  Minimally important difference is 5 units  CAT: CAT can identify the health impairment of COPD patients and is better correlated with disease progression.  CAT has a scoring range of zero to 40. The CAT score is classified into four groups of low (less than 10), medium (10 - 20), high (21-30) and very high (31-40) based on the impact level of disease on health status. A CAT score over 10 suggests significant symptoms.  A worsening CAT score could be explained by an  exacerbation, poor medication adherence, poor inhaler technique, or progression of COPD or comorbid conditions.  CAT MCID is 2 points  mMRC: mMRC (Modified Medical Research Council) Dyspnea Scale is used to assess the degree of baseline functional disability in patients of respiratory disease due to dyspnea. No minimal important difference is established. A decrease in score of 1 point or greater is considered a positive change.   Pulmonary Function Assessment:  Pulmonary Function Assessment - 01/29/24 1320       Breath  Bilateral Breath Sounds Clear;Decreased    Shortness of Breath Yes;Limiting activity          Exercise Target Goals: Exercise Program Goal: Individual exercise prescription set using results from initial 6 min walk test and THRR while considering  patient's activity barriers and safety.   Exercise Prescription Goal: Initial exercise prescription builds to 30-45 minutes a day of aerobic activity, 2-3 days per week.  Home exercise guidelines will be given to patient during program as part of exercise prescription that the participant will acknowledge.  Activity Barriers & Risk Stratification:  Activity Barriers & Cardiac Risk Stratification - 01/29/24 1316       Activity Barriers & Cardiac Risk Stratification   Activity Barriers Deconditioning;Muscular Weakness;Shortness of Breath;Left Hip Replacement;Joint Problems    Cardiac Risk Stratification Moderate          6 Minute Walk:  6 Minute Walk     Row Name 01/29/24 1412         6 Minute Walk   Phase Initial     Distance 1200 feet     Walk Time 6 minutes     # of Rest Breaks 0     MPH 2.27     METS 3.03     RPE 11     Perceived Dyspnea  1     VO2 Peak 10.62     Symptoms No     Resting HR 86 bpm     Resting BP 138/60     Resting Oxygen  Saturation  96 %     Exercise Oxygen  Saturation  during 6 min walk 92 %     Max Ex. HR 122 bpm     Max Ex. BP 168/66     2 Minute Post BP 130/66        Interval HR   1 Minute HR 102     2 Minute HR 104     3 Minute HR 105     4 Minute HR 122     5 Minute HR 114     6 Minute HR 108     2 Minute Post HR 94     Interval Heart Rate? Yes       Interval Oxygen    Interval Oxygen ? Yes     Baseline Oxygen  Saturation % 96 %     1 Minute Oxygen  Saturation % 96 %     1 Minute Liters of Oxygen  0 L     2 Minute Oxygen  Saturation % 92 %     2 Minute Liters of Oxygen  0 L     3 Minute Oxygen  Saturation % 92 %     3 Minute Liters of Oxygen  0 L     4 Minute Oxygen  Saturation % 93 %     4 Minute Liters of Oxygen  0 L     5 Minute Oxygen  Saturation % 93 %     5 Minute Liters of Oxygen  0 L     6 Minute Oxygen  Saturation % 92 %     6 Minute Liters of Oxygen  0 L     2 Minute Post Oxygen  Saturation % 96 %     2 Minute Post Liters of Oxygen  0 L        Oxygen  Initial Assessment:  Oxygen  Initial Assessment - 01/29/24 1318       Home Oxygen    Home Oxygen  Device None    Sleep Oxygen  Prescription None    Home Exercise Oxygen  Prescription  None    Home Resting Oxygen  Prescription None      Initial 6 min Walk   Oxygen  Used None      Program Oxygen  Prescription   Program Oxygen  Prescription None      Intervention   Short Term Goals To learn and understand importance of maintaining oxygen  saturations>88%;To learn and demonstrate proper use of respiratory medications;To learn and understand importance of monitoring SPO2 with pulse oximeter and demonstrate accurate use of the pulse oximeter.;To learn and demonstrate proper pursed lip breathing techniques or other breathing techniques.     Long  Term Goals Maintenance of O2 saturations>88%;Compliance with respiratory medication;Verbalizes importance of monitoring SPO2 with pulse oximeter and return demonstration;Exhibits proper breathing techniques, such as pursed lip breathing or other method taught during program session;Demonstrates proper use of MDI's          Oxygen  Re-Evaluation:   Oxygen   Discharge (Final Oxygen  Re-Evaluation):   Initial Exercise Prescription:  Initial Exercise Prescription - 01/29/24 1400       Date of Initial Exercise RX and Referring Provider   Date 01/29/24    Referring Provider Mannam    Expected Discharge Date 05/06/23      Recumbant Bike   Level 1    Minutes 15    METs 1.8      NuStep   Level 1    SPM 75    Minutes 15    METs 1.8      Prescription Details   Frequency (times per week) 2    Duration Progress to 30 minutes of continuous aerobic without signs/symptoms of physical distress      Intensity   THRR 40-80% of Max Heartrate 56-113    Ratings of Perceived Exertion 11-13    Perceived Dyspnea 0-4      Progression   Progression Continue to progress workloads to maintain intensity without signs/symptoms of physical distress.      Resistance Training   Training Prescription Yes    Weight red bands    Reps 10-15          Perform Capillary Blood Glucose checks as needed.  Exercise Prescription Changes:   Exercise Comments:   Exercise Goals and Review:   Exercise Goals     Row Name 01/29/24 1307             Exercise Goals   Increase Physical Activity Yes       Intervention Provide advice, education, support and counseling about physical activity/exercise needs.;Develop an individualized exercise prescription for aerobic and resistive training based on initial evaluation findings, risk stratification, comorbidities and participant's personal goals.       Expected Outcomes Short Term: Attend rehab on a regular basis to increase amount of physical activity.;Long Term: Exercising regularly at least 3-5 days a week.;Long Term: Add in home exercise to make exercise part of routine and to increase amount of physical activity.       Increase Strength and Stamina Yes       Intervention Provide advice, education, support and counseling about physical activity/exercise needs.;Develop an individualized exercise prescription  for aerobic and resistive training based on initial evaluation findings, risk stratification, comorbidities and participant's personal goals.       Expected Outcomes Short Term: Increase workloads from initial exercise prescription for resistance, speed, and METs.;Short Term: Perform resistance training exercises routinely during rehab and add in resistance training at home;Long Term: Improve cardiorespiratory fitness, muscular endurance and strength as measured by increased METs and functional capacity (  )       Able to understand and use rate of perceived exertion (RPE) scale Yes       Intervention Provide education and explanation on how to use RPE scale       Expected Outcomes Short Term: Able to use RPE daily in rehab to express subjective intensity level;Long Term:  Able to use RPE to guide intensity level when exercising independently       Able to understand and use Dyspnea scale Yes       Intervention Provide education and explanation on how to use Dyspnea scale       Expected Outcomes Short Term: Able to use Dyspnea scale daily in rehab to express subjective sense of shortness of breath during exertion;Long Term: Able to use Dyspnea scale to guide intensity level when exercising independently       Knowledge and understanding of Target Heart Rate Range (THRR) Yes       Intervention Provide education and explanation of THRR including how the numbers were predicted and where they are located for reference       Expected Outcomes Short Term: Able to state/look up THRR;Long Term: Able to use THRR to govern intensity when exercising independently;Short Term: Able to use daily as guideline for intensity in rehab       Understanding of Exercise Prescription Yes       Intervention Provide education, explanation, and written materials on patient's individual exercise prescription       Expected Outcomes Short Term: Able to explain program exercise prescription;Long Term: Able to explain home  exercise prescription to exercise independently          Exercise Goals Re-Evaluation :   Discharge Exercise Prescription (Final Exercise Prescription Changes):   Nutrition:  Target Goals: Understanding of nutrition guidelines, daily intake of sodium 1500mg , cholesterol 200mg , calories 30% from fat and 7% or less from saturated fats, daily to have 5 or more servings of fruits and vegetables.  Biometrics:  Pre Biometrics - 01/29/24 1417       Pre Biometrics   Grip Strength 18 kg           Nutrition Therapy Plan and Nutrition Goals:   Nutrition Assessments:  MEDIFICTS Score Key: >=70 Need to make dietary changes  40-70 Heart Healthy Diet <= 40 Therapeutic Level Cholesterol Diet   Picture Your Plate Scores: <59 Unhealthy dietary pattern with much room for improvement. 41-50 Dietary pattern unlikely to meet recommendations for good health and room for improvement. 51-60 More healthful dietary pattern, with some room for improvement.  >60 Healthy dietary pattern, although there may be some specific behaviors that could be improved.    Nutrition Goals Re-Evaluation:   Nutrition Goals Discharge (Final Nutrition Goals Re-Evaluation):   Psychosocial: Target Goals: Acknowledge presence or absence of significant depression and/or stress, maximize coping skills, provide positive support system. Participant is able to verbalize types and ability to use techniques and skills needed for reducing stress and depression.  Initial Review & Psychosocial Screening:  Initial Psych Review & Screening - 01/29/24 1315       Initial Review   Current issues with None Identified      Family Dynamics   Good Support System? Yes      Barriers   Psychosocial barriers to participate in program There are no identifiable barriers or psychosocial needs.      Screening Interventions   Interventions Encouraged to exercise          Quality  of Life Scores:  Scores of 19 and below  usually indicate a poorer quality of life in these areas.  A difference of  2-3 points is a clinically meaningful difference.  A difference of 2-3 points in the total score of the Quality of Life Index has been associated with significant improvement in overall quality of life, self-image, physical symptoms, and general health in studies assessing change in quality of life.  PHQ-9: Review Flowsheet  More data exists      01/29/2024 12/09/2023 12/04/2022 12/03/2022 01/30/2022  Depression screen PHQ 2/9  Decreased Interest 0 0 0 0 0  Down, Depressed, Hopeless 0 0 0 0 0  PHQ - 2 Score 0 0 0 0 0  Altered sleeping 0 0 - 0 -  Tired, decreased energy 0 0 - 0 -  Change in appetite 0 0 - 0 -  Feeling bad or failure about yourself  0 0 - 0 -  Trouble concentrating 0 0 - 0 -  Moving slowly or fidgety/restless 0 0 - 0 -  Suicidal thoughts 0 0 - 0 -  PHQ-9 Score 0 0 - 0 -  Difficult doing work/chores Not difficult at all Not difficult at all - Not difficult at all -   Interpretation of Total Score  Total Score Depression Severity:  1-4 = Minimal depression, 5-9 = Mild depression, 10-14 = Moderate depression, 15-19 = Moderately severe depression, 20-27 = Severe depression   Psychosocial Evaluation and Intervention:  Psychosocial Evaluation - 01/29/24 1415       Psychosocial Evaluation & Interventions   Interventions Encouraged to exercise with the program and follow exercise prescription    Comments Lynora denies any psychosocial barriers or concerns at this time. Initial PHQ-2/PHQ-9 score 0/0. No needs at this time.    Expected Outcomes For Maham to participate in PR free of any psychosocial barriers or concerns.    Continue Psychosocial Services  No Follow up required          Psychosocial Re-Evaluation:   Psychosocial Discharge (Final Psychosocial Re-Evaluation):   Education: Education Goals: Education classes will be provided on a weekly basis, covering required topics. Participant  will state understanding/return demonstration of topics presented.  Learning Barriers/Preferences:  Learning Barriers/Preferences - 01/29/24 1316       Learning Barriers/Preferences   Learning Barriers Sight   glasses   Learning Preferences None          Education Topics: Know Your Numbers Group instruction that is supported by a PowerPoint presentation. Instructor discusses importance of knowing and understanding resting, exercise, and post-exercise oxygen  saturation, heart rate, and blood pressure. Oxygen  saturation, heart rate, blood pressure, rating of perceived exertion, and dyspnea are reviewed along with a normal range for these values.    Exercise for the Pulmonary Patient Group instruction that is supported by a PowerPoint presentation. Instructor discusses benefits of exercise, core components of exercise, frequency, duration, and intensity of an exercise routine, importance of utilizing pulse oximetry during exercise, safety while exercising, and options of places to exercise outside of rehab.    MET Level  Group instruction provided by PowerPoint, verbal discussion, and written material to support subject matter. Instructor reviews what METs are and how to increase METs.    Pulmonary Medications Verbally interactive group education provided by instructor with focus on inhaled medications and proper administration.   Anatomy and Physiology of the Respiratory System Group instruction provided by PowerPoint, verbal discussion, and written material to support subject matter. Instructor reviews respiratory  cycle and anatomical components of the respiratory system and their functions. Instructor also reviews differences in obstructive and restrictive respiratory diseases with examples of each.    Oxygen  Safety Group instruction provided by PowerPoint, verbal discussion, and written material to support subject matter. There is an overview of "What is Oxygen " and "Why do we  need it".  Instructor also reviews how to create a safe environment for oxygen  use, the importance of using oxygen  as prescribed, and the risks of noncompliance. There is a brief discussion on traveling with oxygen  and resources the patient may utilize.   Oxygen  Use Group instruction provided by PowerPoint, verbal discussion, and written material to discuss how supplemental oxygen  is prescribed and different types of oxygen  supply systems. Resources for more information are provided.    Breathing Techniques Group instruction that is supported by demonstration and informational handouts. Instructor discusses the benefits of pursed lip and diaphragmatic breathing and detailed demonstration on how to perform both.     Risk Factor Reduction Group instruction that is supported by a PowerPoint presentation. Instructor discusses the definition of a risk factor, different risk factors for pulmonary disease, and how the heart and lungs work together.   Pulmonary Diseases Group instruction provided by PowerPoint, verbal discussion, and written material to support subject matter. Instructor gives an overview of the different type of pulmonary diseases. There is also a discussion on risk factors and symptoms as well as ways to manage the diseases.   Stress and Energy Conservation Group instruction provided by PowerPoint, verbal discussion, and written material to support subject matter. Instructor gives an overview of stress and the impact it can have on the body. Instructor also reviews ways to reduce stress. There is also a discussion on energy conservation and ways to conserve energy throughout the day.   Warning Signs and Symptoms Group instruction provided by PowerPoint, verbal discussion, and written material to support subject matter. Instructor reviews warning signs and symptoms of stroke, heart attack, cold and flu. Instructor also reviews ways to prevent the spread of infection.   Other  Education Group or individual verbal, written, or video instructions that support the educational goals of the pulmonary rehab program.    Knowledge Questionnaire Score:  Knowledge Questionnaire Score - 01/29/24 1310       Knowledge Questionnaire Score   Pre Score 15/18          Core Components/Risk Factors/Patient Goals at Admission:  Personal Goals and Risk Factors at Admission - 01/29/24 1310       Core Components/Risk Factors/Patient Goals on Admission   Improve shortness of breath with ADL's Yes    Intervention Provide education, individualized exercise plan and daily activity instruction to help decrease symptoms of SOB with activities of daily living.    Expected Outcomes Short Term: Improve cardiorespiratory fitness to achieve a reduction of symptoms when performing ADLs;Long Term: Be able to perform more ADLs without symptoms or delay the onset of symptoms    Increase knowledge of respiratory medications and ability to use respiratory devices properly  Yes    Intervention Provide education and demonstration as needed of appropriate use of medications, inhalers, and oxygen  therapy.    Expected Outcomes Short Term: Achieves understanding of medications use. Understands that oxygen  is a medication prescribed by physician. Demonstrates appropriate use of inhaler and oxygen  therapy.;Long Term: Maintain appropriate use of medications, inhalers, and oxygen  therapy.          Core Components/Risk Factors/Patient Goals Review:    Core  Components/Risk Factors/Patient Goals at Discharge (Final Review):    ITP Comments:   Comments: Dr. Slater Staff is Medical Director for Pulmonary Rehab at [facility].

## 2024-01-29 NOTE — ED Provider Notes (Signed)
 [facility]    CSN: [id number] Arrival date & time: 01/29/24  1359    HISTORY   Chief Complaint  Patient presents with   Facial Pain   HPI Linda [name] is a pleasant, 77 y.o. female who presents to urgent care today. Patient complains of right sided sinus pain and pressure radiating to her right upper jaw and teeth for the past week.  States her right ear has been hurting as well, denies muffled hearing, drainage from right ear, sore throat, pain with swallowing, pain with chewing.  Patient states when she bends forward the pain becomes excruciating.  Patient states she has been taking Tylenol  sinus every 2-4 hours which does provide relief but tends to wear off very quickly.  Patient reports a history of frequent sinus infections.  Patient denies history of allergies.  Patient reports a very remote history of smoking cigarettes, quit many years ago.  Patient has elevated blood pressure on arrival with otherwise reasonably normal vital signs given patient's age and history of COPD.  The history is provided by the patient.   Past Medical History:  Diagnosis Date   Adenoma of left adrenal gland, radiographically benign and stable appearance 07/03/2023   Age-related osteoporosis without current pathological fracture 05/27/2016   Osteopenic by DEXA 2018, though hx of femur fracture due to fall from stand (trip) while at gym, 05/2016.      Arthritis    qwhere; hands, feet, different body parts from time to time (05/08/2016)   Asthma    Breast mass 07/03/2016   Cancer (HCC)    right breast   Complication of anesthesia    they used an adult tube going down my throat; caused alot of problems; was told to always have pediatric tube placed (05/08/2016)   COPD (chronic obstructive pulmonary disease) (HCC)    COVID-19 03/28/2020   GERD (gastroesophageal reflux disease)    Hearing impairment 03/21/2021   ENT eval and hearing aides provided in past, which were  unsatisfactory.  At conversational volume she frequently requests something to be repeated.  She would like a second opinion at a completely different ENT practice but is not quite ready to pursue this.    History of breast cancer 07/25/2016   History of hiatal hernia    History of radiation therapy 10/07/16- 11/17/16   Right Breast and regional nodes 50 Gy in 25 fractions to breast and at least 45 Gy in 25 fractions to nodes, Right Breast boost 10 Gy in 5 fractions.    Hyperglycemia    Hyperlipidemia    Hypertension    Hypothyroidism    Lump of breast, right    suppose to have it checked soon (05/08/2016)   Migraine    none for years (05/08/2016)   Osteopenia    Personal history of radiation therapy 2018   Pneumonia several times   Post-surgical hypothyroidism 05/09/2016   Status post radioactive iodine  thyroid  ablation    Thyroid  disease    Patient Active Problem List   Diagnosis Date Noted   Complaint of melena 12/29/2023   Irregular heart beat 12/29/2023   Adenoma of left adrenal gland, radiographically benign and stable appearance 07/03/2023   Bilateral nephrolithiasis 07/03/2023   CKD stage 3a, GFR 45-59 ml/min (HCC) 07/02/2023   Abdominal aortic atherosclerosis 12/04/2022   Carpal tunnel syndrome, right 08/29/2021   Irregular bowel habits 08/29/2021   Arthritis of right hand and wrist 08/29/2021   Hearing impairment 03/21/2021   GERD without esophagitis  03/21/2021   BPPV (benign paroxysmal positional vertigo) 06/05/2020   Hypertension 06/05/2020   Former smoker 05/28/2018   Degenerative joint disease of R ankle and foot 12/15/2017   History of breast cancer 07/25/2016   Age-related osteoporosis without current pathological fracture 05/27/2016   Post-surgical hypothyroidism 05/09/2016   Hx of closed left femoral fracture (HCC) 05/2016 due to fall from upright position groundlevel 05/08/2016   COPD, group D, by GOLD 2017 classification (HCC) 01/28/2011   Past Surgical  History:  Procedure Laterality Date   ANKLE SURGERY Right X 2   fibrocystic tumors come up in my joints   APPENDECTOMY  1963   BREAST BIOPSY Right 07/09/2016   malignant   BREAST LUMPECTOMY Right 08/14/2016   BREAST LUMPECTOMY WITH AXILLARY LYMPH NODE DISSECTION Right 08/14/2016   Procedure: RIGHT BREAST LUMPECTOMY WITH AXILLARY LYMPH NODE DISSECTION;  Surgeon: Aron Shoulders, MD;  Location: [facility];  Service: General;  Laterality: Right;   DILATION AND CURETTAGE OF UTERUS  several   when I was young   INGUINAL HERNIA REPAIR Bilateral 2000   MASS EXCISION Right 08/14/2016   Procedure: EXCISION RIGHT ARM MASS 2X3CM;  Surgeon: Aron Shoulders, MD;  Location: [facility];  Service: General;  Laterality: Right;   POLYPECTOMY     vocal cord   TONSILLECTOMY     TOTAL HIP ARTHROPLASTY Left 05/09/2016   Procedure: TOTAL HIP ARTHROPLASTY ANTERIOR APPROACH;  Surgeon: Donnice Car, MD;  Location: [facility];  Service: Orthopedics;  Laterality: Left;   TUBAL LIGATION     VAGINAL HYSTERECTOMY  1979   OB History   No obstetric history on file.    Home Medications    Prior to Admission medications   Medication Sig Start Date End Date Taking? Authorizing Provider  albuterol  (VENTOLIN  HFA) 108 (90 Base) MCG/ACT inhaler Inhale 2 puffs into the lungs every 6 (six) hours as needed for wheezing or shortness of breath. 02/10/23   Mannam, Praveen, MD  amLODipine  (NORVASC ) 10 MG tablet Take 1 tablet (10 mg total) by mouth daily. 12/29/23 12/23/24  Trudy Mliss Dragon, MD  azithromycin  (ZITHROMAX ) 250 MG tablet Take 1 tablet (250 mg total) by mouth daily. Patient taking differently: Take 250 mg by mouth 3 (three) times a week. 06/15/23   Mannam, Praveen, MD  Cholecalciferol (VITAMIN D3) 25 MCG (1000 UT) CAPS Take by mouth.    [provider]  empagliflozin  (JARDIANCE ) 10 MG TABS tablet Take 1 tablet (10 mg total) by mouth daily before breakfast. 07/06/23   Trudy Mliss Dragon, MD  Fluticasone -Umeclidin-Vilant  (TRELEGY ELLIPTA ) 100-62.5-25 MCG/ACT AEPB Inhale 1 puff by mouth once daily 03/06/23   Mannam, Praveen, MD  levothyroxine  (SYNTHROID ) 75 MCG tablet Take 1 tablet (75 mcg total) by mouth daily before breakfast. 09/10/23   Trudy Mliss Dragon, MD  meclizine  (ANTIVERT ) 25 MG tablet Take 1 tablet (25 mg total) by mouth 3 (three) times daily as needed for dizziness. 07/06/23   Trudy Mliss Dragon, MD  meloxicam  (MOBIC ) 7.5 MG tablet Take 1 tablet (7.5 mg total) by mouth daily. Patient not taking: Reported on 01/29/2024 07/02/23 07/01/24  Trudy Mliss Dragon, MD  nicotine  (NICOTROL ) 10 MG inhaler Nicotrol  10 mg inhalation cartridge  USE 1 CARTRIDGE 16 TIMES A DAY AS NEEDED 10/30/20   Trudy Mliss Dragon, MD  olmesartan  (BENICAR ) 40 MG tablet Take 1 tablet (40 mg total) by mouth daily. 12/29/23 12/28/24  Trudy Mliss Dragon, MD  Omeprazole-Sodium Bicarbonate (ZEGERID) 20-1100 MG CAPS capsule Take 1 capsule by mouth  daily before breakfast.    [provider]  pravastatin  (PRAVACHOL ) 40 MG tablet Take 1 tablet (40 mg total) by mouth daily before breakfast. 09/10/23 09/04/24  Trudy Mliss Dragon, MD    Family History Family History  Problem Relation Age of Onset   Hypertension Mother    Stroke Mother    Heart attack Mother    Allergies Mother    Hypertension Father    Lung cancer Father    Diabetes Sister    Heart disease Sister    Lupus Sister    Allergies Daughter    Breast cancer Neg Hx    Social History Social History   Tobacco Use   Smoking status: Former    Current packs/day: 0.00    Average packs/day: 0.8 packs/day for 38.0 years (28.5 ttl pk-yrs)    Types: Cigarettes    Start date: 05/01/1980    Quit date: 05/01/2018    Years since quitting: 5.7   Smokeless tobacco: Never   Tobacco comments:    Using Nicotrol  Inhaler from primary care  Vaping Use   Vaping status: Never Used  Substance Use Topics   Alcohol  use: Yes    Comment: Occasionally   Drug use: No   Allergies    Aspirin   Review of Systems Review of Systems Pertinent findings revealed after performing a 14 point review of systems has been noted in the history of present illness.  Physical Exam Vital Signs BP (!) 152/90 (BP Location: Left Arm)   Pulse 67   Temp (!) 97.4 F (36.3 C) (Oral)   Resp 18   SpO2 93%   No data found.  Physical Exam Vitals and nursing note reviewed.  Constitutional:      General: She is awake. She is not in acute distress.    Appearance: Normal appearance. She is well-developed and well-groomed. She is not ill-appearing.  HENT:     Head: Normocephalic and atraumatic.     Salivary Glands: Right salivary gland is not diffusely enlarged or tender. Left salivary gland is not diffusely enlarged or tender.     Right Ear: Hearing and external ear normal. A middle ear effusion is present.     Left Ear: Hearing, tympanic membrane, ear canal and external ear normal.     Ears:     Comments: Left EAC with erythema    Nose:     Right Turbinates: Not enlarged, swollen or pale.     Left Turbinates: Not enlarged, swollen or pale.     Right Sinus: Maxillary sinus tenderness present. No frontal sinus tenderness.     Left Sinus: No maxillary sinus tenderness or frontal sinus tenderness.     Mouth/Throat:     Lips: Pink. No lesions.     Mouth: Mucous membranes are moist. No oral lesions.     Tongue: No lesions. Tongue does not deviate from midline.     Palate: No mass and lesions.     Pharynx: Oropharynx is clear. Uvula midline. No pharyngeal swelling, oropharyngeal exudate, posterior oropharyngeal erythema, uvula swelling or postnasal drip.     Tonsils: No tonsillar exudate. 0 on the right. 0 on the left.  Eyes:     General: Lids are normal.        Right eye: No discharge.        Left eye: No discharge.     Conjunctiva/sclera: Conjunctivae normal.     Right eye: Right conjunctiva is not injected.     Left eye: Left conjunctiva  is not injected.  Neck:     Trachea:  Trachea and phonation normal.  Cardiovascular:     Rate and Rhythm: Normal rate and regular rhythm.  Pulmonary:     Effort: Pulmonary effort is normal.     Breath sounds: Normal breath sounds.  Chest:     Chest wall: No tenderness.  Musculoskeletal:        General: Normal range of motion.     Cervical back: Full passive range of motion without pain, normal range of motion and neck supple. Normal range of motion.  Lymphadenopathy:     Cervical: No cervical adenopathy.  Skin:    General: Skin is warm and dry.     Findings: No erythema or rash.  Neurological:     General: No focal deficit present.     Mental Status: She is alert and oriented to person, place, and time. Mental status is at baseline.  Psychiatric:        Attention and Perception: Attention and perception normal.        Mood and Affect: Mood and affect normal.        Speech: Speech normal.        Behavior: Behavior normal. Behavior is cooperative.        Thought Content: Thought content normal.     Visual Acuity Right Eye Distance:   Left Eye Distance:   Bilateral Distance:    Right Eye Near:   Left Eye Near:    Bilateral Near:     UC Couse / Diagnostics / Procedures:     Radiology No results found.  Procedures Procedures (including critical care time) EKG  Pending results:  Labs Reviewed - No data to display  Medications Ordered in UC: Medications - No data to display  UC Diagnoses / Final Clinical Impressions(s)   I have reviewed the triage vital signs and the nursing notes.  Pertinent labs & imaging results that were available during my care of the patient were reviewed by me and considered in my medical decision making (see chart for details).    Final diagnoses:  Acute bacterial sinusitis  Physical exam findings consistent with acute bacterial sinusitis, viral testing not indicated. Based on duration of symptoms and the fact that patient takes azithromycin  regularly as prescribed by her  pulmonologist to reduce frequency of COPD exacerbations, will begin patient on Vantin for broader spectrum coverage of bacteria causing acute bacterial sinusitis at this time.  Recommend 10-day course.  Patient also provided with prescription for methylprednisolone 60 mg daily to take until pain resolves, patient states she has been advised not to take ibuprofen and Tylenol  does not seem to help.  Conservative care recommended.  Return precautions advised.  Please see discharge instructions below for details of plan of care as provided to patient. ED Prescriptions     Medication Sig Dispense Auth. Provider   methylPREDNISolone (MEDROL) 8 MG tablet Take 2 tablets (16 mg total) by mouth daily for 5 days. 10 tablet Joesph Shaver Scales, PA-C   cefpodoxime (VANTIN) 200 MG tablet Take 1 tablet (200 mg total) by mouth 2 (two) times daily for 10 days. 20 tablet Joesph Shaver Scales, PA-C      PDMP not reviewed this encounter.  Pending results:  Labs Reviewed - No data to display    Discharge Instructions      Please read below to learn more about the medications, dosages and frequencies that I recommend to help alleviate your symptoms and to get  you feeling better soon:   Vantin (cefpodoxime):  Please take one (1) dose twice daily for 10 days.  This antibiotic can cause upset stomach, this will resolve once antibiotics are complete.  You are welcome to take a probiotic, eat yogurt, take Imodium while taking this medication.  Please avoid other systemic medications such as Maalox, Pepto-Bismol or milk of magnesia as they can interfere with the body's ability to absorb the antibiotics.  If you feel completely better after 7 days, you may discontinue this medication.  If you have any lingering symptoms after 7 days, please finish the full 10 days.  This antibiotic is safe to take at the same time as azithromycin .   Medrol (methylprednisolone): This is a steroid that will significantly calm the  inflammation in and around your sinuses which is causing your pain.  Please take the 2 tablets daily with food until your pain is resolved.  If symptoms have not meaningfully improved in the next 5 to 7 days, please return for repeat evaluation or follow-up with your regular provider.  If symptoms have worsened in the next 3 to 5 days, please go to the emergency room for further evaluation.    Thank you for visiting urgent care today.  We appreciate the opportunity to participate in your care.       Disposition Upon Discharge:  Condition: stable for discharge home  Patient presented with an acute illness with associated systemic symptoms and significant discomfort requiring urgent management. In my opinion, this is a condition that a prudent lay person (someone who possesses an average knowledge of health and medicine) may potentially expect to result in complications if not addressed urgently such as respiratory distress, impairment of bodily function or dysfunction of bodily organs.   Routine symptom specific, illness specific and/or disease specific instructions were discussed with the patient and/or caregiver at length.   As such, the patient has been evaluated and assessed, work-up was performed and treatment was provided in alignment with urgent care protocols and evidence based medicine.  Patient/parent/caregiver has been advised that the patient may require follow up for further testing and treatment if the symptoms continue in spite of treatment, as clinically indicated and appropriate.  Patient/parent/caregiver has been advised to return to the [facility] or PCP if no better; to PCP or the Emergency Department if new signs and symptoms develop, or if the current signs or symptoms continue to change or worsen for further workup, evaluation and treatment as clinically indicated and appropriate  The patient will follow up with their current PCP if and as advised. If the patient does not  currently have a PCP we will assist them in obtaining one.   The patient may need specialty follow up if the symptoms continue, in spite of conservative treatment and management, for further workup, evaluation, consultation and treatment as clinically indicated and appropriate.  Patient/parent/caregiver verbalized understanding and agreement of plan as discussed.  All questions were addressed during visit.  Please see discharge instructions below for further details of plan.  This office note has been dictated using Teaching laboratory technician.  Unfortunately, this method of dictation can sometimes lead to typographical or grammatical errors.  I apologize for your inconvenience in advance if this occurs.  Please do not hesitate to reach out to me if clarification is needed.      Joesph Shaver Scales, PA-C 01/29/24 1524

## 2024-01-29 NOTE — Discharge Instructions (Signed)
 Please read below to learn more about the medications, dosages and frequencies that I recommend to help alleviate your symptoms and to get you feeling better soon:   Vantin (cefpodoxime):  Please take one (1) dose twice daily for 10 days.  This antibiotic can cause upset stomach, this will resolve once antibiotics are complete.  You are welcome to take a probiotic, eat yogurt, take Imodium while taking this medication.  Please avoid other systemic medications such as Maalox, Pepto-Bismol or milk of magnesia as they can interfere with the body's ability to absorb the antibiotics.  If you feel completely better after 7 days, you may discontinue this medication.  If you have any lingering symptoms after 7 days, please finish the full 10 days.  This antibiotic is safe to take at the same time as azithromycin .   Medrol (methylprednisolone): This is a steroid that will significantly calm the inflammation in and around your sinuses which is causing your pain.  Please take the 2 tablets daily with food until your pain is resolved.  If symptoms have not meaningfully improved in the next 5 to 7 days, please return for repeat evaluation or follow-up with your regular provider.  If symptoms have worsened in the next 3 to 5 days, please go to the emergency room for further evaluation.    Thank you for visiting urgent care today.  We appreciate the opportunity to participate in your care.

## 2024-02-04 ENCOUNTER — Encounter (HOSPITAL_COMMUNITY)
Admission: RE | Admit: 2024-02-04 | Discharge: 2024-02-04 | Disposition: A | Discharge status: Home / Self Care | Source: Ambulatory Visit | Attending: Pulmonary Disease | Admitting: Pulmonary Disease

## 2024-02-04 DIAGNOSIS — J449 Chronic obstructive pulmonary disease, unspecified: Secondary | ICD-10-CM | POA: Insufficient documentation

## 2024-02-04 NOTE — Progress Notes (Signed)
 Daily Session Note  Patient Details  Name: Linda [name] MRN: [id number] Date of Birth: 1945-08-23 Referring Provider:   Conrad Ports Pulmonary Rehab Walk Test from 01/29/2024 in [facility]  Referring Provider Mannam    Encounter Date: 02/04/2024  Check In:  Session Check In - 02/04/24 1328       Check-In   Supervising physician immediately available to respond to emergencies [facility] MD immediately available    Physician(s) Orren Fabry, NP    Location [facility]    Staff Present Ronal Levin, RN, Maud Moats, MS, ACSM-CEP, Exercise Physiologist;Sholom Dulude Claudene, RT    Virtual Visit No    Medication changes reported     No    Fall or balance concerns reported    No    Tobacco Cessation No Change    Warm-up and Cool-down Performed as group-led instruction    Resistance Training Performed Yes    VAD Patient? No    PAD/SET Patient? No      Pain Assessment   Currently in Pain? No/denies    Multiple Pain Sites No          Capillary Blood Glucose: No results found for this or any previous visit (from the past 24 hours).    Social History   Tobacco Use  Smoking Status Former   Current packs/day: 0.00   Average packs/day: 0.8 packs/day for 38.0 years (28.5 ttl pk-yrs)   Types: Cigarettes   Start date: 05/01/1980   Quit date: 05/01/2018   Years since quitting: 5.7  Smokeless Tobacco Never  Tobacco Comments   Using Nicotrol  Inhaler from primary care    Goals Met:  Proper associated with RPD/PD & O2 Sat Independence with exercise equipment Exercise tolerated well No report of concerns or symptoms today Strength training completed today  Goals Unmet:  Not Applicable  Comments: Service time is from 1309 to 1431.    Dr. Slater Staff is Medical Director for Pulmonary Rehab at [facility].

## 2024-02-09 ENCOUNTER — Encounter (HOSPITAL_COMMUNITY)
Admission: RE | Admit: 2024-02-09 | Discharge: 2024-02-09 | Disposition: A | Discharge status: Home / Self Care | Source: Ambulatory Visit | Attending: Pulmonary Disease | Admitting: Pulmonary Disease

## 2024-02-09 VITALS — Wt 127.4 lb

## 2024-02-09 DIAGNOSIS — J449 Chronic obstructive pulmonary disease, unspecified: Secondary | ICD-10-CM

## 2024-02-09 NOTE — Progress Notes (Signed)
 Daily Session Note  Patient Details  Name: Linda [name] MRN: [id number] Date of Birth: [birth year] Referring Provider:   Conrad Ports Pulmonary Rehab Walk Test from 01/29/2024 in [facility]  Referring Provider Mannam    Encounter Date: 02/09/2024  Check In:  Session Check In - 02/09/24 1409       Check-In   Supervising physician immediately available to respond to emergencies [facility] MD immediately available    Physician(s) Barnie Hila, NP    Location [facility]    Staff Present Ronal Levin, RN, Maud Moats, MS, ACSM-CEP, Exercise Physiologist;Casey Claudene Evert Byes, RN, MHA;Randi Midge BS, ACSM-CEP, Exercise Physiologist    Virtual Visit No    Medication changes reported     No    Fall or balance concerns reported    No    Tobacco Cessation No Change    Warm-up and Cool-down Performed as group-led instruction    Resistance Training Performed Yes    VAD Patient? No    PAD/SET Patient? No      Pain Assessment   Currently in Pain? No/denies    Pain Score 0-No pain    Multiple Pain Sites No          Capillary Blood Glucose: No results found for this or any previous visit (from the past 24 hours).   Exercise Prescription Changes - 02/09/24 1500       Response to Exercise   Blood Pressure (Admit) 142/78    Blood Pressure (Exercise) 140/80    Blood Pressure (Exit) 124/76    Heart Rate (Admit) 80 bpm    Heart Rate (Exercise) 94 bpm    Heart Rate (Exit) 77 bpm    Oxygen  Saturation (Admit) 95 %    Oxygen  Saturation (Exercise) 94 %    Oxygen  Saturation (Exit) 98 %    Rating of Perceived Exertion (Exercise) 13    Perceived Dyspnea (Exercise) 2    Duration Continue with 30 min of aerobic exercise without signs/symptoms of physical distress.    Intensity THRR unchanged      Progression   Progression Continue to progress workloads to maintain intensity without  signs/symptoms of physical distress.      Resistance Training   Training Prescription Yes    Weight red bands    Reps 10-15    Time 10 Minutes      Recumbant Bike   Level 1    RPM 57    Minutes 15    METs 1.9      NuStep   Level 1    Minutes 15    METs 1.8          Social History   Tobacco Use  Smoking Status Former   Current packs/day: 0.00   Average packs/day: 0.8 packs/day for 38.0 years (28.5 ttl pk-yrs)   Types: Cigarettes   Start date: 05/01/1980   Quit date: 05/01/2018   Years since quitting: 5.7  Smokeless Tobacco Never  Tobacco Comments   Using Nicotrol  Inhaler from primary care    Goals Met:  Exercise tolerated well Queuing for purse lip breathing No report of concerns or symptoms today Strength training completed today  Goals Unmet:  Not Applicable  Comments: Service time is from 1309 to 46    Dr. Slater Staff is Medical Director for Pulmonary Rehab at [facility].

## 2024-02-10 ENCOUNTER — Other Ambulatory Visit: Payer: Self-pay | Admitting: Pulmonary Disease

## 2024-02-11 ENCOUNTER — Encounter (HOSPITAL_COMMUNITY)
Admission: RE | Admit: 2024-02-11 | Discharge: 2024-02-11 | Disposition: A | Discharge status: Home / Self Care | Source: Ambulatory Visit | Attending: Pulmonary Disease | Admitting: Pulmonary Disease

## 2024-02-11 DIAGNOSIS — J449 Chronic obstructive pulmonary disease, unspecified: Secondary | ICD-10-CM

## 2024-02-11 NOTE — Progress Notes (Signed)
 Daily Session Note  Patient Details  Name: Linda [name] MRN: [id number] Date of Birth: 1946/02/09 Referring Provider:   Conrad Ports Pulmonary Rehab Walk Test from 01/29/2024 in [facility]  Referring Provider Mannam    Encounter Date: 02/11/2024  Check In:  Session Check In - 02/11/24 1333       Check-In   Supervising physician immediately available to respond to emergencies [facility] MD immediately available    Physician(s) Josefa Beauvais, NP    Location [facility]    Staff Present Ronal Levin, RN, Maud Moats, MS, ACSM-CEP, Exercise Physiologist;Casey Claudene Idelia Aris BS, ACSM-CEP, Exercise Physiologist    Virtual Visit No    Medication changes reported     No    Fall or balance concerns reported    No    Tobacco Cessation No Change    Warm-up and Cool-down Performed as group-led instruction    Resistance Training Performed Yes    VAD Patient? No    PAD/SET Patient? No      Pain Assessment   Currently in Pain? No/denies          Capillary Blood Glucose: No results found for this or any previous visit (from the past 24 hours).    Social History   Tobacco Use  Smoking Status Former   Current packs/day: 0.00   Average packs/day: 0.8 packs/day for 38.0 years (28.5 ttl pk-yrs)   Types: Cigarettes   Start date: 05/01/1980   Quit date: 05/01/2018   Years since quitting: 5.7  Smokeless Tobacco Never  Tobacco Comments   Using Nicotrol  Inhaler from primary care    Goals Met:  Exercise tolerated well Queuing for purse lip breathing No report of concerns or symptoms today Strength training completed today  Goals Unmet:  Not Applicable  Comments: Service time is from 1304 to 1449    Dr. Slater Staff is Medical Director for Pulmonary Rehab at [facility].

## 2024-02-16 ENCOUNTER — Encounter (HOSPITAL_COMMUNITY): Payer: Self-pay

## 2024-02-16 ENCOUNTER — Encounter (HOSPITAL_COMMUNITY)
Admission: RE | Admit: 2024-02-16 | Discharge: 2024-02-16 | Disposition: A | Discharge status: Home / Self Care | Source: Ambulatory Visit | Attending: Pulmonary Disease | Admitting: Pulmonary Disease

## 2024-02-16 ENCOUNTER — Ambulatory Visit (HOSPITAL_COMMUNITY)
Admission: EM | Admit: 2024-02-16 | Discharge: 2024-02-16 | Disposition: A | Discharge status: Home / Self Care | Attending: Physician Assistant | Admitting: Physician Assistant

## 2024-02-16 VITALS — Wt 124.8 lb

## 2024-02-16 DIAGNOSIS — J449 Chronic obstructive pulmonary disease, unspecified: Secondary | ICD-10-CM

## 2024-02-16 DIAGNOSIS — J0120 Acute ethmoidal sinusitis, unspecified: Secondary | ICD-10-CM | POA: Diagnosis not present

## 2024-02-16 MED ORDER — AMOXICILLIN-POT CLAVULANATE 875-125 MG PO TABS: 1.0000 | ORAL_TABLET | Freq: Two times a day (BID) | ORAL | 0 refills | Status: AC

## 2024-02-16 NOTE — ED Provider Notes (Signed)
 [facility]    CSN: [id number] Arrival date & time: 02/16/24  1436      History   Chief Complaint Chief Complaint  Patient presents with   Facial Pain   Headache    HPI Linda [name] is a 78 y.o. female.   Patient complains of right sided facial pain.  Patient reports she was treated October 31 for sinusitis.  Patient finished antibiotic and steroids.  Patient reports that the pain never resolved.  Patient reports continued right sided facial pain.  Patient denies any fever or chills she has not had any nausea or vomiting.  Patient has COPD and she is on Zithromax  to prevent lung infections.  The history is provided by the patient. No language interpreter was used.  Headache   Past Medical History:  Diagnosis Date   Adenoma of left adrenal gland, radiographically benign and stable appearance 07/03/2023   Age-related osteoporosis without current pathological fracture 05/27/2016   Osteopenic by DEXA 2018, though hx of femur fracture due to fall from stand (trip) while at gym, 05/2016.      Arthritis    qwhere; hands, feet, different body parts from time to time (05/08/2016)   Asthma    Breast mass 07/03/2016   Cancer (HCC)    right breast   Complication of anesthesia    they used an adult tube going down my throat; caused alot of problems; was told to always have pediatric tube placed (05/08/2016)   COPD (chronic obstructive pulmonary disease) (HCC)    COVID-19 03/28/2020   GERD (gastroesophageal reflux disease)    Hearing impairment 03/21/2021   ENT eval and hearing aides provided in past, which were unsatisfactory.  At conversational volume she frequently requests something to be repeated.  She would like a second opinion at a completely different ENT practice but is not quite ready to pursue this.    History of breast cancer 07/25/2016   History of hiatal hernia    History of radiation therapy 10/07/16- 11/17/16   Right Breast and regional nodes 50 Gy in  25 fractions to breast and at least 45 Gy in 25 fractions to nodes, Right Breast boost 10 Gy in 5 fractions.    Hyperglycemia    Hyperlipidemia    Hypertension    Hypothyroidism    Lump of breast, right    suppose to have it checked soon (05/08/2016)   Migraine    none for years (05/08/2016)   Osteopenia    Personal history of radiation therapy 2018   Pneumonia several times   Post-surgical hypothyroidism 05/09/2016   Status post radioactive iodine  thyroid  ablation    Thyroid  disease     Patient Active Problem List   Diagnosis Date Noted   Complaint of melena 12/29/2023   Irregular heart beat 12/29/2023   Adenoma of left adrenal gland, radiographically benign and stable appearance 07/03/2023   Bilateral nephrolithiasis 07/03/2023   CKD stage 3a, GFR 45-59 ml/min (HCC) 07/02/2023   Abdominal aortic atherosclerosis 12/04/2022   Carpal tunnel syndrome, right 08/29/2021   Irregular bowel habits 08/29/2021   Arthritis of right hand and wrist 08/29/2021   Hearing impairment 03/21/2021   GERD without esophagitis 03/21/2021   BPPV (benign paroxysmal positional vertigo) 06/05/2020   Hypertension 06/05/2020   Former smoker 05/28/2018   Degenerative joint disease of R ankle and foot 12/15/2017   History of breast cancer 07/25/2016   Age-related osteoporosis without current pathological fracture 05/27/2016   Post-surgical hypothyroidism 05/09/2016  Hx of closed left femoral fracture (HCC) 05/2016 due to fall from upright position groundlevel 05/08/2016   COPD, group D, by GOLD 2017 classification (HCC) 01/28/2011    Past Surgical History:  Procedure Laterality Date   ANKLE SURGERY Right X 2   fibrocystic tumors come up in my joints   APPENDECTOMY  1963   BREAST BIOPSY Right 07/09/2016   malignant   BREAST LUMPECTOMY Right 08/14/2016   BREAST LUMPECTOMY WITH AXILLARY LYMPH NODE DISSECTION Right 08/14/2016   Procedure: RIGHT BREAST LUMPECTOMY WITH AXILLARY LYMPH NODE DISSECTION;   Surgeon: Aron Shoulders, MD;  Location: [facility];  Service: General;  Laterality: Right;   DILATION AND CURETTAGE OF UTERUS  several   when I was young   INGUINAL HERNIA REPAIR Bilateral 2000   MASS EXCISION Right 08/14/2016   Procedure: EXCISION RIGHT ARM MASS 2X3CM;  Surgeon: Aron Shoulders, MD;  Location: [facility];  Service: General;  Laterality: Right;   POLYPECTOMY     vocal cord   TONSILLECTOMY     TOTAL HIP ARTHROPLASTY Left 05/09/2016   Procedure: TOTAL HIP ARTHROPLASTY ANTERIOR APPROACH;  Surgeon: Donnice Car, MD;  Location: [facility];  Service: Orthopedics;  Laterality: Left;   TUBAL LIGATION     VAGINAL HYSTERECTOMY  1979    OB History   No obstetric history on file.      Home Medications    Prior to Admission medications   Medication Sig Start Date End Date Taking? Authorizing Provider  albuterol  (VENTOLIN  HFA) 108 (90 Base) MCG/ACT inhaler Inhale 2 puffs into the lungs every 6 (six) hours as needed for wheezing or shortness of breath. 02/10/23  Yes Mannam, Praveen, MD  amLODipine  (NORVASC ) 10 MG tablet Take 1 tablet (10 mg total) by mouth daily. 12/29/23 12/23/24 Yes Trudy Mliss Dragon, MD  amoxicillin -clavulanate (AUGMENTIN ) 875-125 MG tablet Take 1 tablet by mouth 2 (two) times daily. 02/16/24  Yes Raeven Pint K, PA-C  Cholecalciferol (VITAMIN D3) 25 MCG (1000 UT) CAPS Take by mouth.   Yes [provider]  empagliflozin  (JARDIANCE ) 10 MG TABS tablet Take 1 tablet (10 mg total) by mouth daily before breakfast. 07/06/23  Yes Trudy Mliss Dragon, MD  Fluticasone -Umeclidin-Vilant (TRELEGY ELLIPTA ) 100-62.5-25 MCG/ACT AEPB INHALE 1 PUFF ONCE DAILY 02/10/24  Yes Mannam, Praveen, MD  levothyroxine  (SYNTHROID ) 75 MCG tablet Take 1 tablet (75 mcg total) by mouth daily before breakfast. 09/10/23  Yes Trudy Mliss Dragon, MD  meclizine  (ANTIVERT ) 25 MG tablet Take 1 tablet (25 mg total) by mouth 3 (three) times daily as needed for dizziness. 07/06/23  Yes Trudy Mliss Dragon, MD   nicotine  (NICOTROL ) 10 MG inhaler Nicotrol  10 mg inhalation cartridge  USE 1 CARTRIDGE 16 TIMES A DAY AS NEEDED 10/30/20  Yes Trudy Mliss Dragon, MD  olmesartan  (BENICAR ) 40 MG tablet Take 1 tablet (40 mg total) by mouth daily. 12/29/23 12/28/24 Yes Trudy Mliss Dragon, MD  Omeprazole-Sodium Bicarbonate (ZEGERID) 20-1100 MG CAPS capsule Take 1 capsule by mouth daily before breakfast.   Yes [provider]  pravastatin  (PRAVACHOL ) 40 MG tablet Take 1 tablet (40 mg total) by mouth daily before breakfast. 09/10/23 09/04/24 Yes Trudy Mliss Dragon, MD  azithromycin  (ZITHROMAX ) 250 MG tablet Take 1 tablet (250 mg total) by mouth daily. Patient taking differently: Take 250 mg by mouth 3 (three) times a week. 06/15/23   Mannam, Praveen, MD    Family History Family History  Problem Relation Age of Onset   Hypertension Mother    Stroke Mother  Heart attack Mother    Allergies Mother    Hypertension Father    Lung cancer Father    Diabetes Sister    Heart disease Sister    Lupus Sister    Allergies Daughter    Breast cancer Neg Hx     Social History Social History   Tobacco Use   Smoking status: Former    Current packs/day: 0.00    Average packs/day: 0.8 packs/day for 38.0 years (28.5 ttl pk-yrs)    Types: Cigarettes    Start date: 05/01/1980    Quit date: 05/01/2018    Years since quitting: 5.8   Smokeless tobacco: Never   Tobacco comments:    Using Nicotrol  Inhaler from primary care  Vaping Use   Vaping status: Never Used  Substance Use Topics   Alcohol  use: Yes    Comment: Occasionally   Drug use: No     Allergies   Aspirin    Review of Systems Review of Systems  Neurological:  Positive for headaches.  All other systems reviewed and are negative.    Physical Exam Triage Vital Signs ED Triage Vitals [02/16/24 1517]  Encounter Vitals Group     BP 139/78     Girls Systolic BP Percentile      Girls Diastolic BP Percentile      Boys Systolic BP Percentile       Boys Diastolic BP Percentile      Pulse Rate 81     Resp 18     Temp 98.1 F (36.7 C)     Temp Source Oral     SpO2 95 %     Weight      Height      Head Circumference      Peak Flow      Pain Score      Pain Loc      Pain Education      Exclude from Growth Chart    No data found.  Updated Vital Signs BP 139/78 (BP Location: Left Arm)   Pulse 81   Temp 98.1 F (36.7 C) (Oral)   Resp 18   SpO2 95%   Visual Acuity Right Eye Distance:   Left Eye Distance:   Bilateral Distance:    Right Eye Near:   Left Eye Near:    Bilateral Near:     Physical Exam Vitals reviewed.  Constitutional:      Appearance: She is well-developed.  HENT:     Head: Normocephalic.     Mouth/Throat:     Mouth: Mucous membranes are moist.  Eyes:     Comments: Tender right maxillary sinus,   Cardiovascular:     Rate and Rhythm: Normal rate.     Heart sounds: Normal heart sounds.  Pulmonary:     Effort: Pulmonary effort is normal.  Abdominal:     Palpations: Abdomen is soft.  Musculoskeletal:     Cervical back: Normal range of motion.  Skin:    General: Skin is warm.  Neurological:     Mental Status: She is alert.  Psychiatric:        Mood and Affect: Mood normal.      UC Treatments / Results  Labs (all labs ordered are listed, but only abnormal results are displayed) Labs Reviewed - No data to display  EKG   Radiology No results found.  Procedures Procedures (including critical care time)  Medications Ordered in UC Medications - No data to display  Initial Impression /  Assessment and Plan / UC Course  I have reviewed the triage vital signs and the nursing notes.  Pertinent labs & imaging results that were available during my care of the patient were reviewed by me and considered in my medical decision making (see chart for details).     Patient may have persistent sinus infection.  Patient given a prescription for Augmentin .  Patient given the phone number for  ENT to see if symptoms persist. Final Clinical Impressions(s) / UC Diagnoses   Final diagnoses:  Acute non-recurrent ethmoidal sinusitis     Discharge Instructions      Return if any problems.    ED Prescriptions     Medication Sig Dispense Auth. Provider   amoxicillin -clavulanate (AUGMENTIN ) 875-125 MG tablet Take 1 tablet by mouth 2 (two) times daily. 20 tablet Hillery Bhalla K, PA-C      PDMP not reviewed this encounter. An After Visit Summary was printed and given to the patient.    Flint Sonny POUR, PA-C 02/16/24 1733

## 2024-02-16 NOTE — Discharge Instructions (Signed)
 Return if any problems.

## 2024-02-16 NOTE — Progress Notes (Signed)
 Daily Session Note  Patient Details  Name: Linda [name] MRN: [id number] Date of Birth: April 19, 1945 Referring Provider:   Conrad Ports Pulmonary Rehab Walk Test from 01/29/2024 in [facility]  Referring Provider Mannam    Encounter Date: 02/16/2024  Check In:  Session Check In - 02/16/24 1428       Check-In   Supervising physician immediately available to respond to emergencies [facility] MD immediately available    Physician(s) Josefa Beauvais, NP    Location [facility]    Staff Present Ronal Levin, RN, Maud Moats, MS, ACSM-CEP, Exercise Physiologist;Mercy Leppla Claudene Idelia Aris BS, ACSM-CEP, Exercise Physiologist    Virtual Visit No    Medication changes reported     No    Fall or balance concerns reported    No    Tobacco Cessation No Change    Warm-up and Cool-down Performed as group-led instruction    Resistance Training Performed Yes    VAD Patient? No    PAD/SET Patient? No      Pain Assessment   Currently in Pain? No/denies    Pain Score 0-No pain    Multiple Pain Sites No          Capillary Blood Glucose: No results found for this or any previous visit (from the past 24 hours).    Social History   Tobacco Use  Smoking Status Former   Current packs/day: 0.00   Average packs/day: 0.8 packs/day for 38.0 years (28.5 ttl pk-yrs)   Types: Cigarettes   Start date: 05/01/1980   Quit date: 05/01/2018   Years since quitting: 5.8  Smokeless Tobacco Never  Tobacco Comments   Using Nicotrol  Inhaler from primary care    Goals Met:  Proper associated with RPD/PD & O2 Sat [place] with exercise equipment Exercise tolerated well No report of concerns or symptoms today  Goals Unmet:  Not Applicable  Comments: Service time is from 1308 to 1420.    Dr. Slater Staff is Medical Director for Pulmonary Rehab at [facility].

## 2024-02-16 NOTE — ED Triage Notes (Signed)
 PT states she is not any better since 01/29/2024. Completed the abx but still has a headache,ear pain and sinus pressure.

## 2024-02-17 ENCOUNTER — Telehealth (HOSPITAL_COMMUNITY): Payer: Self-pay

## 2024-02-17 NOTE — Telephone Encounter (Signed)
 Patient left message stating she will not be returning to 1:15 PR class until 12/02.

## 2024-02-18 ENCOUNTER — Encounter (HOSPITAL_COMMUNITY)

## 2024-02-23 ENCOUNTER — Encounter (HOSPITAL_COMMUNITY)

## 2024-02-24 DIAGNOSIS — M19071 Primary osteoarthritis, right ankle and foot: Secondary | ICD-10-CM | POA: Diagnosis not present

## 2024-02-24 NOTE — Progress Notes (Signed)
 Pulmonary Individual Treatment Plan  Patient Details  Name: Linda [name] MRN: [id number] Date of Birth: 06/22/1945 Referring Provider:   Conrad Ports Pulmonary Rehab Walk Test from 01/29/2024 in [facility]  Referring Provider Mannam    Initial Encounter Date:  Flowsheet Row Pulmonary Rehab Walk Test from 01/29/2024 in [facility]  Date 01/29/24    Visit Diagnosis: Stage 3 severe COPD by GOLD classification (HCC)  Patient's Home Medications on Admission:   Current Outpatient Medications:    albuterol  (VENTOLIN  HFA) 108 (90 Base) MCG/ACT inhaler, Inhale 2 puffs into the lungs every 6 (six) hours as needed for wheezing or shortness of breath., Disp: 8 g, Rfl: 2   amLODipine  (NORVASC ) 10 MG tablet, Take 1 tablet (10 mg total) by mouth daily., Disp: 90 tablet, Rfl: 3   amoxicillin -clavulanate (AUGMENTIN ) 875-125 MG tablet, Take 1 tablet by mouth 2 (two) times daily., Disp: 20 tablet, Rfl: 0   azithromycin  (ZITHROMAX ) 250 MG tablet, Take 1 tablet (250 mg total) by mouth daily. (Patient taking differently: Take 250 mg by mouth 3 (three) times a week.), Disp: 30 tablet, Rfl: 5   Cholecalciferol (VITAMIN D3) 25 MCG (1000 UT) CAPS, Take by mouth., Disp: , Rfl:    empagliflozin  (JARDIANCE ) 10 MG TABS tablet, Take 1 tablet (10 mg total) by mouth daily before breakfast., Disp: 30 tablet, Rfl: 11   Fluticasone -Umeclidin-Vilant (TRELEGY ELLIPTA ) 100-62.5-25 MCG/ACT AEPB, INHALE 1 PUFF ONCE DAILY, Disp: 60 each, Rfl: 3   levothyroxine  (SYNTHROID ) 75 MCG tablet, Take 1 tablet (75 mcg total) by mouth daily before breakfast., Disp: 90 tablet, Rfl: 3   meclizine  (ANTIVERT ) 25 MG tablet, Take 1 tablet (25 mg total) by mouth 3 (three) times daily as needed for dizziness., Disp: 60 tablet, Rfl: 0   nicotine  (NICOTROL ) 10 MG inhaler, Nicotrol  10 mg inhalation cartridge  USE 1 CARTRIDGE  16 TIMES A DAY AS NEEDED, Disp: 42 each, Rfl: 3   olmesartan  (BENICAR ) 40 MG tablet, Take 1 tablet (40 mg total) by mouth daily., Disp: 90 tablet, Rfl: 3   Omeprazole-Sodium Bicarbonate (ZEGERID) 20-1100 MG CAPS capsule, Take 1 capsule by mouth daily before breakfast., Disp: , Rfl:    pravastatin  (PRAVACHOL ) 40 MG tablet, Take 1 tablet (40 mg total) by mouth daily before breakfast., Disp: 90 tablet, Rfl: 3  Past Medical History: Past Medical History:  Diagnosis Date   Adenoma of left adrenal gland, radiographically benign and stable appearance 07/03/2023   Age-related osteoporosis without current pathological fracture 05/27/2016   Osteopenic by DEXA 2018, though hx of femur fracture due to fall from stand (trip) while at gym, 05/2016.      Arthritis    qwhere; hands, feet, different body parts from time to time (05/08/2016)   Asthma    Breast mass 07/03/2016   Cancer (HCC)    right breast   Complication of anesthesia    they used an adult tube going down my throat; caused alot of problems; was told to always have pediatric tube placed (05/08/2016)   COPD (chronic obstructive pulmonary disease) (HCC)    COVID-19 03/28/2020   GERD (gastroesophageal reflux disease)    Hearing impairment 03/21/2021   ENT eval and hearing aides provided in past, which were unsatisfactory.  At conversational volume she frequently requests something to be repeated.  She would like a second opinion at a completely different ENT practice but is not quite  ready to pursue this.    History of breast cancer 07/25/2016   History of hiatal hernia    History of radiation therapy 10/07/16- 11/17/16   Right Breast and regional nodes 50 Gy in 25 fractions to breast and at least 45 Gy in 25 fractions to nodes, Right Breast boost 10 Gy in 5 fractions.    Hyperglycemia    Hyperlipidemia    Hypertension    Hypothyroidism    Lump of breast, right    suppose to have it checked soon (05/08/2016)   Migraine    none for years  (05/08/2016)   Osteopenia    Personal history of radiation therapy 2018   Pneumonia several times   Post-surgical hypothyroidism 05/09/2016   Status post radioactive iodine  thyroid  ablation    Thyroid  disease     Tobacco Use: Social History   Tobacco Use  Smoking Status Former   Current packs/day: 0.00   Average packs/day: 0.8 packs/day for 38.0 years (28.5 ttl pk-yrs)   Types: Cigarettes   Start date: 05/01/1980   Quit date: 05/01/2018   Years since quitting: 5.8  Smokeless Tobacco Never  Tobacco Comments   Using Nicotrol  Inhaler from primary care    Labs: Review Flowsheet       Latest Ref Rng & Units 07/26/2020 05/06/2022 10/29/2022 03/12/2023  Labs for ITP Cardiac and Pulmonary Rehab  Cholestrol 100 - 199 mg/dL 825  845  - 834   LDL (calc) 0 - 99 mg/dL 92  93  - 91   HDL-C >60 mg/dL 39  40  - 43   Trlycerides 0 - 149 mg/dL 743  884  - 819   TCO2 22 - 32 mmol/L - - 23  -    Capillary Blood Glucose: No results found for: GLUCAP   Pulmonary Assessment Scores:  Pulmonary Assessment Scores     Row Name 01/29/24 1310         ADL UCSD   ADL Phase Entry     SOB Score total 38       CAT Score   CAT Score 25       mMRC Score   mMRC Score 3       UCSD: Self-administered rating of dyspnea associated with activities of daily living (ADLs) 6-point scale (0 = not at all to 5 = maximal or unable to do because of breathlessness)  Scoring Scores range from 0 to 120.  Minimally important difference is 5 units  CAT: CAT can identify the health impairment of COPD patients and is better correlated with disease progression.  CAT has a scoring range of zero to 40. The CAT score is classified into four groups of low (less than 10), medium (10 - 20), high (21-30) and very high (31-40) based on the impact level of disease on health status. A CAT score over 10 suggests significant symptoms.  A worsening CAT score could be explained by an exacerbation, poor medication  adherence, poor inhaler technique, or progression of COPD or comorbid conditions.  CAT MCID is 2 points  mMRC: mMRC (Modified Medical Research Council) Dyspnea Scale is used to assess the degree of baseline functional disability in patients of respiratory disease due to dyspnea. No minimal important difference is established. A decrease in score of 1 point or greater is considered a positive change.   Pulmonary Function Assessment:  Pulmonary Function Assessment - 01/29/24 1320       Breath   Bilateral Breath Sounds Clear;Decreased  Shortness of Breath Yes;Limiting activity          Exercise Target Goals: Exercise Program Goal: Individual exercise prescription set using results from initial 6 min walk test and THRR while considering  patient's activity barriers and safety.   Exercise Prescription Goal: Initial exercise prescription builds to 30-45 minutes a day of aerobic activity, 2-3 days per week.  Home exercise guidelines will be given to patient during program as part of exercise prescription that the participant will acknowledge.  Activity Barriers & Risk Stratification:  Activity Barriers & Cardiac Risk Stratification - 01/29/24 1316       Activity Barriers & Cardiac Risk Stratification   Activity Barriers Deconditioning;Muscular Weakness;Shortness of Breath;Left Hip Replacement;Joint Problems    Cardiac Risk Stratification Moderate          6 Minute Walk:  6 Minute Walk     Row Name 01/29/24 1412         6 Minute Walk   Phase Initial     Distance 1200 feet     Walk Time 6 minutes     # of Rest Breaks 0     MPH 2.27     METS 3.03     RPE 11     Perceived Dyspnea  1     VO2 Peak 10.62     Symptoms No     Resting HR 86 bpm     Resting BP 138/60     Resting Oxygen  Saturation  96 %     Exercise Oxygen  Saturation  during 6 min walk 92 %     Max Ex. HR 122 bpm     Max Ex. BP 168/66     2 Minute Post BP 130/66       Interval HR   1 Minute HR 102      2 Minute HR 104     3 Minute HR 105     4 Minute HR 122     5 Minute HR 114     6 Minute HR 108     2 Minute Post HR 94     Interval Heart Rate? Yes       Interval Oxygen    Interval Oxygen ? Yes     Baseline Oxygen  Saturation % 96 %     1 Minute Oxygen  Saturation % 96 %     1 Minute Liters of Oxygen  0 L     2 Minute Oxygen  Saturation % 92 %     2 Minute Liters of Oxygen  0 L     3 Minute Oxygen  Saturation % 92 %     3 Minute Liters of Oxygen  0 L     4 Minute Oxygen  Saturation % 93 %     4 Minute Liters of Oxygen  0 L     5 Minute Oxygen  Saturation % 93 %     5 Minute Liters of Oxygen  0 L     6 Minute Oxygen  Saturation % 92 %     6 Minute Liters of Oxygen  0 L     2 Minute Post Oxygen  Saturation % 96 %     2 Minute Post Liters of Oxygen  0 L        Oxygen  Initial Assessment:  Oxygen  Initial Assessment - 01/29/24 1318       Home Oxygen    Home Oxygen  Device None    Sleep Oxygen  Prescription None    Home Exercise Oxygen  Prescription None    Home Resting Oxygen   Prescription None      Initial 6 min Walk   Oxygen  Used None      Program Oxygen  Prescription   Program Oxygen  Prescription None      Intervention   Short Term Goals To learn and understand importance of maintaining oxygen  saturations>88%;To learn and demonstrate proper use of respiratory medications;To learn and understand importance of monitoring SPO2 with pulse oximeter and demonstrate accurate use of the pulse oximeter.;To learn and demonstrate proper pursed lip breathing techniques or other breathing techniques.     Long  Term Goals Maintenance of O2 saturations>88%;Compliance with respiratory medication;Verbalizes importance of monitoring SPO2 with pulse oximeter and return demonstration;Exhibits proper breathing techniques, such as pursed lip breathing or other method taught during program session;Demonstrates proper use of MDI's          Oxygen  Re-Evaluation:  Oxygen  Re-Evaluation     Row Name 02/18/24 0854              Program Oxygen  Prescription   Program Oxygen  Prescription None         Home Oxygen    Home Oxygen  Device None       Sleep Oxygen  Prescription None       Home Exercise Oxygen  Prescription None       Home Resting Oxygen  Prescription None         Goals/Expected Outcomes   Short Term Goals To learn and understand importance of maintaining oxygen  saturations>88%;To learn and demonstrate proper use of respiratory medications;To learn and understand importance of monitoring SPO2 with pulse oximeter and demonstrate accurate use of the pulse oximeter.;To learn and demonstrate proper pursed lip breathing techniques or other breathing techniques.        Long  Term Goals Maintenance of O2 saturations>88%;Compliance with respiratory medication;Verbalizes importance of monitoring SPO2 with pulse oximeter and return demonstration;Exhibits proper breathing techniques, such as pursed lip breathing or other method taught during program session;Demonstrates proper use of MDI's       Goals/Expected Outcomes Compliance and understanding of oxygen  saturation monitoring and breathing techniques to decrease shortness of breath.          Oxygen  Discharge (Final Oxygen  Re-Evaluation):  Oxygen  Re-Evaluation - 02/18/24 0854       Program Oxygen  Prescription   Program Oxygen  Prescription None      Home Oxygen    Home Oxygen  Device None    Sleep Oxygen  Prescription None    Home Exercise Oxygen  Prescription None    Home Resting Oxygen  Prescription None      Goals/Expected Outcomes   Short Term Goals To learn and understand importance of maintaining oxygen  saturations>88%;To learn and demonstrate proper use of respiratory medications;To learn and understand importance of monitoring SPO2 with pulse oximeter and demonstrate accurate use of the pulse oximeter.;To learn and demonstrate proper pursed lip breathing techniques or other breathing techniques.     Long  Term Goals Maintenance of O2  saturations>88%;Compliance with respiratory medication;Verbalizes importance of monitoring SPO2 with pulse oximeter and return demonstration;Exhibits proper breathing techniques, such as pursed lip breathing or other method taught during program session;Demonstrates proper use of MDI's    Goals/Expected Outcomes Compliance and understanding of oxygen  saturation monitoring and breathing techniques to decrease shortness of breath.          Initial Exercise Prescription:  Initial Exercise Prescription - 01/29/24 1400       Date of Initial Exercise RX and Referring Provider   Date 01/29/24    Referring Provider [facility]    Expected Discharge Date  05/06/23      Recumbant Bike   Level 1    Minutes 15    METs 1.8      NuStep   Level 1    SPM 75    Minutes 15    METs 1.8      Prescription Details   Frequency (times per week) 2    Duration Progress to 30 minutes of continuous aerobic without signs/symptoms of physical distress      Intensity   THRR 40-80% of Max Heartrate 56-113    Ratings of Perceived Exertion 11-13    Perceived Dyspnea 0-4      Progression   Progression Continue to progress workloads to maintain intensity without signs/symptoms of physical distress.      Resistance Training   Training Prescription Yes    Weight red bands    Reps 10-15          Perform Capillary Blood Glucose checks as needed.  Exercise Prescription Changes:   Exercise Prescription Changes     Row Name 02/09/24 1500 02/23/24 0900           Response to Exercise   Blood Pressure (Admit) 142/78 120/58      Blood Pressure (Exercise) 140/80 --      Blood Pressure (Exit) 124/76 118/78      Heart Rate (Admit) 80 bpm 83 bpm      Heart Rate (Exercise) 94 bpm 87 bpm      Heart Rate (Exit) 77 bpm 89 bpm      Oxygen  Saturation (Admit) 95 % 97 %      Oxygen  Saturation (Exercise) 94 % 95 %      Oxygen  Saturation (Exit) 98 % 97 %      Rating of Perceived Exertion (Exercise) 13 13       Perceived Dyspnea (Exercise) 2 2      Duration Continue with 30 min of aerobic exercise without signs/symptoms of physical distress. Continue with 30 min of aerobic exercise without signs/symptoms of physical distress.      Intensity THRR unchanged THRR unchanged        Progression   Progression Continue to progress workloads to maintain intensity without signs/symptoms of physical distress. Continue to progress workloads to maintain intensity without signs/symptoms of physical distress.        Resistance Training   Training Prescription Yes Yes      Weight red bands red bands      Reps 10-15 10-15      Time 10 Minutes 10 Minutes        Recumbant Bike   Level 1 1      RPM 57 --      Minutes 15 15      METs 1.9 2        NuStep   Level 1 2      SPM -- 73      Minutes 15 15      METs 1.8 2         Exercise Comments:   Exercise Goals and Review:   Exercise Goals     Row Name 01/29/24 1307             Exercise Goals   Increase Physical Activity Yes       Intervention Provide advice, education, support and counseling about physical activity/exercise needs.;Develop an individualized exercise prescription for aerobic and resistive training based on initial evaluation findings, risk stratification, comorbidities and participant's personal goals.  Expected Outcomes Short Term: Attend rehab on a regular basis to increase amount of physical activity.;Long Term: Exercising regularly at least 3-5 days a week.;Long Term: Add in home exercise to make exercise part of routine and to increase amount of physical activity.       Increase Strength and Stamina Yes       Intervention Provide advice, education, support and counseling about physical activity/exercise needs.;Develop an individualized exercise prescription for aerobic and resistive training based on initial evaluation findings, risk stratification, comorbidities and participant's personal goals.       Expected Outcomes Short  Term: Increase workloads from initial exercise prescription for resistance, speed, and METs.;Short Term: Perform resistance training exercises routinely during rehab and add in resistance training at home;Long Term: Improve cardiorespiratory fitness, muscular endurance and strength as measured by increased METs and functional capacity ( )       Able to understand and use rate of perceived exertion (RPE) scale Yes       Intervention Provide education and explanation on how to use RPE scale       Expected Outcomes Short Term: Able to use RPE daily in rehab to express subjective intensity level;Long Term:  Able to use RPE to guide intensity level when exercising independently       Able to understand and use Dyspnea scale Yes       Intervention Provide education and explanation on how to use Dyspnea scale       Expected Outcomes Short Term: Able to use Dyspnea scale daily in rehab to express subjective sense of shortness of breath during exertion;Long Term: Able to use Dyspnea scale to guide intensity level when exercising independently       Knowledge and understanding of Target Heart Rate Range (THRR) Yes       Intervention Provide education and explanation of THRR including how the numbers were predicted and where they are located for reference       Expected Outcomes Short Term: Able to state/look up THRR;Long Term: Able to use THRR to govern intensity when exercising independently;Short Term: Able to use daily as guideline for intensity in rehab       Understanding of Exercise Prescription Yes       Intervention Provide education, explanation, and written materials on patient's individual exercise prescription       Expected Outcomes Short Term: Able to explain program exercise prescription;Long Term: Able to explain home exercise prescription to exercise independently          Exercise Goals Re-Evaluation :  Exercise Goals Re-Evaluation     Row Name 02/18/24 0852             Exercise  Goal Re-Evaluation   Exercise Goals Review Increase Physical Activity;Able to understand and use Dyspnea scale;Understanding of Exercise Prescription;Increase Strength and Stamina;Knowledge and understanding of Target Heart Rate Range (THRR);Able to understand and use rate of perceived exertion (RPE) scale       Comments Linda [name] has completed 4 exercise sessions. She is exercising on the recumbent stepper for 15 min, level 2 now, METs 2. She then is exercising on the recumbent bike for 15 min, level 1, METs 2.0. She is limited by ankle pain and sometimes does not stand or do warm up and cool down exercises. She is using red bands, 3.7 lbs, when she does resistance. Will progress as tolerated however she will miss the next 2 weeks.       Expected Outcomes Through exercise at rehab and home,  the patient will decrease shortness of breath with daily activities and feel confident in carrying out an exercise regimen at home.          Discharge Exercise Prescription (Final Exercise Prescription Changes):  Exercise Prescription Changes - 02/23/24 0900       Response to Exercise   Blood Pressure (Admit) 120/58    Blood Pressure (Exit) 118/78    Heart Rate (Admit) 83 bpm    Heart Rate (Exercise) 87 bpm    Heart Rate (Exit) 89 bpm    Oxygen  Saturation (Admit) 97 %    Oxygen  Saturation (Exercise) 95 %    Oxygen  Saturation (Exit) 97 %    Rating of Perceived Exertion (Exercise) 13    Perceived Dyspnea (Exercise) 2    Duration Continue with 30 min of aerobic exercise without signs/symptoms of physical distress.    Intensity THRR unchanged      Progression   Progression Continue to progress workloads to maintain intensity without signs/symptoms of physical distress.      Resistance Training   Training Prescription Yes    Weight red bands    Reps 10-15    Time 10 Minutes      Recumbant Bike   Level 1    Minutes 15    METs 2      NuStep   Level 2    SPM 73    Minutes 15    METs 2           Nutrition:  Target Goals: Understanding of nutrition guidelines, daily intake of sodium 1500mg , cholesterol 200mg , calories 30% from fat and 7% or less from saturated fats, daily to have 5 or more servings of fruits and vegetables.  Biometrics:  Pre Biometrics - 01/29/24 1417       Pre Biometrics   Grip Strength 18 kg           Nutrition Therapy Plan and Nutrition Goals:  Nutrition Therapy & Goals - 02/04/24 1354       Nutrition Therapy   Diet Regular diet    Drug/Food Interactions Statins/Certain Fruits      Personal Nutrition Goals   Nutrition Goal Patient to improve diet quality by using plate method as guide for meal planning to include lean protein/plant protein, fruits, vegetables, whole grains, nonfat dairy as part of a well-balanced diet.    Comments Pt with history of severe COPD, GERD, CKD. Endorses desire to make healthier food choices. Also notes variable appetite and some early satiety. Underweight based on BMI for >=65 years.      Intervention Plan   Intervention Prescribe, educate and counsel regarding individualized specific dietary modifications aiming towards targeted core components such as weight, hypertension, lipid management, diabetes, heart failure and other comorbidities.;Nutrition handout(s) given to patient.   Nutrition Tips for Better Breathing   Expected Outcomes Short Term Goal: Understand basic principles of dietary content, such as calories, fat, sodium, cholesterol and nutrients.;Long Term Goal: Adherence to prescribed nutrition plan.          Nutrition Assessments:  MEDIFICTS Score Key: >=70 Need to make dietary changes  40-70 Heart Healthy Diet <= 40 Therapeutic Level Cholesterol Diet   Picture Your Plate Scores: <59 Unhealthy dietary pattern with much room for improvement. 41-50 Dietary pattern unlikely to meet recommendations for good health and room for improvement. 51-60 More healthful dietary pattern, with some room for  improvement.  >60 Healthy dietary pattern, although there may be some specific behaviors that could be  improved.    Nutrition Goals Re-Evaluation:   Nutrition Goals Discharge (Final Nutrition Goals Re-Evaluation):   Psychosocial: Target Goals: Acknowledge presence or absence of significant depression and/or stress, maximize coping skills, provide positive support system. Participant is able to verbalize types and ability to use techniques and skills needed for reducing stress and depression.  Initial Review & Psychosocial Screening:  Initial Psych Review & Screening - 01/29/24 1315       Initial Review   Current issues with None Identified      Family Dynamics   Good Support System? Yes      Barriers   Psychosocial barriers to participate in program There are no identifiable barriers or psychosocial needs.      Screening Interventions   Interventions Encouraged to exercise          Quality of Life Scores:  Scores of 19 and below usually indicate a poorer quality of life in these areas.  A difference of  2-3 points is a clinically meaningful difference.  A difference of 2-3 points in the total score of the Quality of Life Index has been associated with significant improvement in overall quality of life, self-image, physical symptoms, and general health in studies assessing change in quality of life.  PHQ-9: Review Flowsheet  More data exists      01/29/2024 12/09/2023 12/04/2022 12/03/2022 01/30/2022  Depression screen PHQ 2/9  Decreased Interest 0 0 0 0 0  Down, Depressed, Hopeless 0 0 0 0 0  PHQ - 2 Score 0 0 0 0 0  Altered sleeping 0 0 - 0 -  Tired, decreased energy 0 0 - 0 -  Change in appetite 0 0 - 0 -  Feeling bad or failure about yourself  0 0 - 0 -  Trouble concentrating 0 0 - 0 -  Moving slowly or fidgety/restless 0 0 - 0 -  Suicidal thoughts 0 0 - 0 -  PHQ-9 Score 0  0  - 0  -  Difficult doing work/chores Not difficult at all Not difficult at all - Not  difficult at all -    Details       Data saved with a previous flowsheet row definition        Interpretation of Total Score  Total Score Depression Severity:  1-4 = Minimal depression, 5-9 = Mild depression, 10-14 = Moderate depression, 15-19 = Moderately severe depression, 20-27 = Severe depression   Psychosocial Evaluation and Intervention:  Psychosocial Evaluation - 01/29/24 1415       Psychosocial Evaluation & Interventions   Interventions Encouraged to exercise with the program and follow exercise prescription    Comments Linda [name] denies any psychosocial barriers or concerns at this time. Initial PHQ-2/PHQ-9 score 0/0. No needs at this time.    Expected Outcomes For Linda [name] to participate in PR free of any psychosocial barriers or concerns.    Continue Psychosocial Services  No Follow up required          Psychosocial Re-Evaluation:  Psychosocial Re-Evaluation     Row Name 02/22/24 0915             Psychosocial Re-Evaluation   Current issues with None Identified       Comments Monthly psychosocial re-evaluation as follows: Pt denies any psy.soc barriers or concerns. She denies needing any additional education, resources or referrals       Expected Outcomes For Linda [name] to continue participating in PR free of any psychosocial barriers or concerns.  Interventions Encouraged to attend Pulmonary Rehabilitation for the exercise       Continue Psychosocial Services  No Follow up required          Psychosocial Discharge (Final Psychosocial Re-Evaluation):  Psychosocial Re-Evaluation - 02/22/24 0915       Psychosocial Re-Evaluation   Current issues with None Identified    Comments Monthly psychosocial re-evaluation as follows: Pt denies any psy.soc barriers or concerns. She denies needing any additional education, resources or referrals    Expected Outcomes For Linda [name] to continue participating in PR free of any psychosocial barriers or concerns.     Interventions Encouraged to attend Pulmonary Rehabilitation for the exercise    Continue Psychosocial Services  No Follow up required          Education: Education Goals: Education classes will be provided on a weekly basis, covering required topics. Participant will state understanding/return demonstration of topics presented.  Learning Barriers/Preferences:  Learning Barriers/Preferences - 01/29/24 1316       Learning Barriers/Preferences   Learning Barriers Sight   glasses   Learning Preferences None          Education Topics: Know Your Numbers Group instruction that is supported by a PowerPoint presentation. Instructor discusses importance of knowing and understanding resting, exercise, and post-exercise oxygen  saturation, heart rate, and blood pressure. Oxygen  saturation, heart rate, blood pressure, rating of perceived exertion, and dyspnea are reviewed along with a normal range for these values.    Exercise for the Pulmonary Patient Group instruction that is supported by a PowerPoint presentation. Instructor discusses benefits of exercise, core components of exercise, frequency, duration, and intensity of an exercise routine, importance of utilizing pulse oximetry during exercise, safety while exercising, and options of places to exercise outside of rehab.    MET Level  Group instruction provided by PowerPoint, verbal discussion, and written material to support subject matter. Instructor reviews what METs are and how to increase METs.  Flowsheet Row PULMONARY REHAB CHRONIC OBSTRUCTIVE PULMONARY DISEASE from 02/04/2024 in [facility]  Date 02/04/24  Educator EP  Instruction Review Code 1- Verbalizes Understanding    Pulmonary Medications Verbally interactive group education provided by instructor with focus on inhaled medications and proper administration.   Anatomy and Physiology of the Respiratory  System Group instruction provided by PowerPoint, verbal discussion, and written material to support subject matter. Instructor reviews respiratory cycle and anatomical components of the respiratory system and their functions. Instructor also reviews differences in obstructive and restrictive respiratory diseases with examples of each.    Oxygen  Safety Group instruction provided by PowerPoint, verbal discussion, and written material to support subject matter. There is an overview of "What is Oxygen " and "Why do we need it".  Instructor also reviews how to create a safe environment for oxygen  use, the importance of using oxygen  as prescribed, and the risks of noncompliance. There is a brief discussion on traveling with oxygen  and resources the patient may utilize.   Oxygen  Use Group instruction provided by PowerPoint, verbal discussion, and written material to discuss how supplemental oxygen  is prescribed and different types of oxygen  supply systems. Resources for more information are provided.    Breathing Techniques Group instruction that is supported by demonstration and informational handouts. Instructor discusses the benefits of pursed lip and diaphragmatic breathing and detailed demonstration on how to perform both.     Risk Factor Reduction Group instruction that is supported by a PowerPoint presentation. Instructor discusses  the definition of a risk factor, different risk factors for pulmonary disease, and how the heart and lungs work together.   Pulmonary Diseases Group instruction provided by PowerPoint, verbal discussion, and written material to support subject matter. Instructor gives an overview of the different type of pulmonary diseases. There is also a discussion on risk factors and symptoms as well as ways to manage the diseases. Flowsheet Row PULMONARY REHAB CHRONIC OBSTRUCTIVE PULMONARY DISEASE from 02/11/2024 in [facility]  Date 02/11/24  Educator RT  Instruction Review Code 1- Verbalizes Understanding    Stress and Energy Conservation Group instruction provided by PowerPoint, verbal discussion, and written material to support subject matter. Instructor gives an overview of stress and the impact it can have on the body. Instructor also reviews ways to reduce stress. There is also a discussion on energy conservation and ways to conserve energy throughout the day.   Warning Signs and Symptoms Group instruction provided by PowerPoint, verbal discussion, and written material to support subject matter. Instructor reviews warning signs and symptoms of stroke, heart attack, cold and flu. Instructor also reviews ways to prevent the spread of infection.   Other Education Group or individual verbal, written, or video instructions that support the educational goals of the pulmonary rehab program.    Knowledge Questionnaire Score:  Knowledge Questionnaire Score - 01/29/24 1310       Knowledge Questionnaire Score   Pre Score 15/18          Core Components/Risk Factors/Patient Goals at Admission:  Personal Goals and Risk Factors at Admission - 01/29/24 1310       Core Components/Risk Factors/Patient Goals on Admission   Improve shortness of breath with ADL's Yes    Intervention Provide education, individualized exercise plan and daily activity instruction to help decrease symptoms of SOB with activities of daily living.    Expected Outcomes Short Term: Improve cardiorespiratory fitness to achieve a reduction of symptoms when performing ADLs;Long Term: Be able to perform more ADLs without symptoms or delay the onset of symptoms    Increase knowledge of respiratory medications and ability to use respiratory devices properly  Yes    Intervention Provide education and demonstration as needed of appropriate use of medications, inhalers, and oxygen  therapy.    Expected Outcomes Short Term: Achieves  understanding of medications use. Understands that oxygen  is a medication prescribed by physician. Demonstrates appropriate use of inhaler and oxygen  therapy.;Long Term: Maintain appropriate use of medications, inhalers, and oxygen  therapy.          Core Components/Risk Factors/Patient Goals Review:   Goals and Risk Factor Review     Row Name 02/22/24 [phone]             Core Components/Risk Factors/Patient Goals Review   Personal Goals Review Improve shortness of breath with ADL's;Develop more efficient breathing techniques such as purse lipped breathing and diaphragmatic breathing and practicing self-pacing with activity.;Increase knowledge of respiratory medications and ability to use respiratory devices properly.       Review Monthly review of patient's Core Components/Risk Factors/Patient Goals are as follows:  Goal in progress for improving her shortness of breath with ADLs. Linda [name] is trying to build up her strength and endurance. She has completed 4 sessions so far and is exercising on the Nu[employer] and bike. Her oxygen  saturation has been in a normal range on room air with exertion. Goal progressing for developing more efficient breathing techniques such as purse  lipped breathing and diaphragmatic breathing; and practicing self-pacing with activity. Linda [name] needs to be prompted to perform purse lipped breathing while short of breath. We work on this with her while performing the warmup and while exercising. She is working on diaphragmatic breathing at home. Goal met on increasing her knowledge of respiratory medications and the ability to use her devices correctly. Linda [name] worked with our respiratory therapist on how to properly use her inhalers. Staff have discussed how to use, when to use, side effects and contraindications with Linda [name]. All questions were answered, and she understood without assistance. Linda [name] will continue to benefit from PR for nutrition, education, exercise, and lifestyle  modification.       Expected Outcomes Pt will show progress toward meeting expected goals and outcomes.          Core Components/Risk Factors/Patient Goals at Discharge (Final Review):   Goals and Risk Factor Review - 02/22/24 0924       Core Components/Risk Factors/Patient Goals Review   Personal Goals Review Improve shortness of breath with ADL's;Develop more efficient breathing techniques such as purse lipped breathing and diaphragmatic breathing and practicing self-pacing with activity.;Increase knowledge of respiratory medications and ability to use respiratory devices properly.    Review Monthly review of patient's Core Components/Risk Factors/Patient Goals are as follows:  Goal in progress for improving her shortness of breath with ADLs. Linda [name] is trying to build up her strength and endurance. She has completed 4 sessions so far and is exercising on the NuStep and bike. Her oxygen  saturation has been in a normal range on room air with exertion. Goal progressing for developing more efficient breathing techniques such as purse lipped breathing and diaphragmatic breathing; and practicing self-pacing with activity. Linda [name] needs to be prompted to perform purse lipped breathing while short of breath. We work on this with her while performing the warmup and while exercising. She is working on diaphragmatic breathing at home. Goal met on increasing her knowledge of respiratory medications and the ability to use her devices correctly. Linda [name] worked with our respiratory therapist on how to properly use her inhalers. Staff have discussed how to use, when to use, side effects and contraindications with Linda [name]. All questions were answered, and she understood without assistance. Linda [name] will continue to benefit from PR for nutrition, education, exercise, and lifestyle modification.    Expected Outcomes Pt will show progress toward meeting expected goals and outcomes.          ITP Comments: Pt is  making expected progress toward Pulmonary Rehab goals after completing 4 session(s). Recommend continued exercise, life style modification, education, and utilization of breathing techniques to increase stamina and strength, while also decreasing shortness of breath with exertion.  Dr. Slater Staff is Medical Director for Pulmonary Rehab at [facility].

## 2024-03-01 ENCOUNTER — Encounter (HOSPITAL_COMMUNITY): Admission: RE | Admit: 2024-03-01 | Attending: Pulmonary Disease

## 2024-03-03 ENCOUNTER — Encounter (HOSPITAL_COMMUNITY): Admission: RE | Admit: 2024-03-03 | Attending: Pulmonary Disease

## 2024-03-03 ENCOUNTER — Telehealth (HOSPITAL_COMMUNITY): Payer: Self-pay

## 2024-03-03 NOTE — Telephone Encounter (Signed)
 Called pt to check on her since she missed PR classes this week. No answer. Left VM.

## 2024-03-08 ENCOUNTER — Encounter (HOSPITAL_COMMUNITY)
Admission: RE | Admit: 2024-03-08 | Discharge: 2024-03-08 | Disposition: A | Discharge status: Home / Self Care | Source: Ambulatory Visit | Attending: Pulmonary Disease | Admitting: Pulmonary Disease

## 2024-03-08 DIAGNOSIS — J449 Chronic obstructive pulmonary disease, unspecified: Secondary | ICD-10-CM | POA: Insufficient documentation

## 2024-03-08 NOTE — Addendum Note (Signed)
 Encounter addended by: Claudene Augustin NOVAK, RRT on: 03/08/2024 3:23 PM  Actions taken: Vitals modified, Flowsheet data copied forward, Flowsheet accepted

## 2024-03-09 ENCOUNTER — Telehealth (HOSPITAL_COMMUNITY): Payer: Self-pay

## 2024-03-09 NOTE — Addendum Note (Signed)
 Encounter addended by: Marrie Chandra, RN on: 03/09/2024 11:34 AM  Actions taken: Episode resolved

## 2024-03-09 NOTE — Telephone Encounter (Signed)
 Called Linda [name] to check on her since she hasn't been to PR class in awhile. Johann states that her orthopedic doctor recommended that she not continue PR until her ankle gets better. Shina Wass that we would discharge her from the program and she can get another referral to participate in the program when her ankle is better.

## 2024-03-10 ENCOUNTER — Encounter (HOSPITAL_COMMUNITY)

## 2024-03-10 NOTE — Addendum Note (Signed)
 Encounter addended by: Midge Cloyd POUR on: 03/10/2024 4:11 PM  Actions taken: Flowsheet data copied forward, Flowsheet accepted

## 2024-03-11 NOTE — Addendum Note (Signed)
 Encounter addended by: Claudene Augustin NOVAK, RRT on: 03/11/2024 10:03 AM  Actions taken: Clinical Note Signed

## 2024-03-11 NOTE — Progress Notes (Signed)
 Discharge Progress Report  Patient Details  Name: Linda [name] MRN: [id number] Date of Birth: [birth year] Referring Provider:   [facility] Walk Test from 01/29/2024 in [facility]  Referring Provider Mannam     Number of Visits: 4  Reason for Discharge:  Early Exit:  Lack of attendance  Smoking History:  Tobacco Use History[1]  Diagnosis:  Stage 3 severe COPD by GOLD classification (HCC)  ADL UCSD:  Pulmonary Assessment Scores     Row Name 01/29/24 1310         ADL UCSD   ADL Phase Entry     SOB Score total 38       CAT Score   CAT Score 25       mMRC Score   mMRC Score 3        Initial Exercise Prescription:  Initial Exercise Prescription - 01/29/24 1400       Date of Initial Exercise RX and Referring Provider   Date 01/29/24    Referring Provider Mannam    Expected Discharge Date 05/06/23      Recumbant Bike   Level 1    Minutes 15    METs 1.8      NuStep   Level 1    SPM 75    Minutes 15    METs 1.8      Prescription Details   Frequency (times per week) 2    Duration Progress to 30 minutes of continuous aerobic without signs/symptoms of physical distress      Intensity   THRR 40-80% of Max Heartrate 56-113    Ratings of Perceived Exertion 11-13    Perceived Dyspnea 0-4      Progression   Progression Continue to progress workloads to maintain intensity without signs/symptoms of physical distress.      Resistance Training   Training Prescription Yes    Weight red bands    Reps 10-15          Discharge Exercise Prescription (Final Exercise Prescription Changes):  Exercise Prescription Changes - 02/23/24 0900       Response to Exercise   Blood Pressure (Admit) 120/58    Blood Pressure (Exit) 118/78    Heart Rate (Admit) 83 bpm    Heart Rate (Exercise) 87 bpm    Heart Rate (Exit) 89 bpm    Oxygen  Saturation (Admit) 97 %    Oxygen  Saturation  (Exercise) 95 %    Oxygen  Saturation (Exit) 97 %    Rating of Perceived Exertion (Exercise) 13    Perceived Dyspnea (Exercise) 2    Duration Continue with 30 min of aerobic exercise without signs/symptoms of physical distress.    Intensity THRR unchanged      Progression   Progression Continue to progress workloads to maintain intensity without signs/symptoms of physical distress.      Resistance Training   Training Prescription Yes    Weight red bands    Reps 10-15    Time 10 Minutes      Recumbant Bike   Level 1    Minutes 15    METs 2      NuStep   Level 2    SPM 73    Minutes 15    METs 2          Functional Capacity:  6 Minute Walk     Row Name 01/29/24 1412  6 Minute Walk   Phase Initial     Distance 1200 feet     Walk Time 6 minutes     # of Rest Breaks 0     MPH 2.27     METS 3.03     RPE 11     Perceived Dyspnea  1     VO2 Peak 10.62     Symptoms No     Resting HR 86 bpm     Resting BP 138/60     Resting Oxygen  Saturation  96 %     Exercise Oxygen  Saturation  during 6 min walk 92 %     Max Ex. HR 122 bpm     Max Ex. BP 168/66     2 Minute Post BP 130/66       Interval HR   1 Minute HR 102     2 Minute HR 104     3 Minute HR 105     4 Minute HR 122     5 Minute HR 114     6 Minute HR 108     2 Minute Post HR 94     Interval Heart Rate? Yes       Interval Oxygen    Interval Oxygen ? Yes     Baseline Oxygen  Saturation % 96 %     1 Minute Oxygen  Saturation % 96 %     1 Minute Liters of Oxygen  0 L     2 Minute Oxygen  Saturation % 92 %     2 Minute Liters of Oxygen  0 L     3 Minute Oxygen  Saturation % 92 %     3 Minute Liters of Oxygen  0 L     4 Minute Oxygen  Saturation % 93 %     4 Minute Liters of Oxygen  0 L     5 Minute Oxygen  Saturation % 93 %     5 Minute Liters of Oxygen  0 L     6 Minute Oxygen  Saturation % 92 %     6 Minute Liters of Oxygen  0 L     2 Minute Post Oxygen  Saturation % 96 %     2 Minute Post Liters of  Oxygen  0 L        Psychological, QOL, Others - Outcomes: PHQ 2/9:    01/29/2024    1:06 PM 12/09/2023   10:41 AM 12/04/2022   10:09 AM 12/03/2022   10:49 AM 01/30/2022   11:50 AM  Depression screen PHQ 2/9  Decreased Interest 0 0 0 0 0  Down, Depressed, Hopeless 0 0 0 0 0  PHQ - 2 Score 0 0 0 0 0  Altered sleeping 0 0  0   Tired, decreased energy 0 0  0   Change in appetite 0 0  0   Feeling bad or failure about yourself  0 0  0   Trouble concentrating 0 0  0   Moving slowly or fidgety/restless 0 0  0   Suicidal thoughts 0 0  0   PHQ-9 Score 0  0   0    Difficult doing work/chores Not difficult at all Not difficult at all  Not difficult at all      Data saved with a previous flowsheet row definition    Quality of Life:   Personal Goals: Goals established at orientation with interventions provided to work toward goal.  Personal Goals and Risk Factors at Admission - 01/29/24 1310  Core Components/Risk Factors/Patient Goals on Admission   Improve shortness of breath with ADL's Yes    Intervention Provide education, individualized exercise plan and daily activity instruction to help decrease symptoms of SOB with activities of daily living.    Expected Outcomes Short Term: Improve cardiorespiratory fitness to achieve a reduction of symptoms when performing ADLs;Long Term: Be able to perform more ADLs without symptoms or delay the onset of symptoms    Increase knowledge of respiratory medications and ability to use respiratory devices properly  Yes    Intervention Provide education and demonstration as needed of appropriate use of medications, inhalers, and oxygen  therapy.    Expected Outcomes Short Term: Achieves understanding of medications use. Understands that oxygen  is a medication prescribed by physician. Demonstrates appropriate use of inhaler and oxygen  therapy.;Long Term: Maintain appropriate use of medications, inhalers, and oxygen  therapy.           Personal Goals  Discharge:  Goals and Risk Factor Review     Row Name 02/22/24 9075 03/09/24 1129           Core Components/Risk Factors/Patient Goals Review   Personal Goals Review Improve shortness of breath with ADL's;Develop more efficient breathing techniques such as purse lipped breathing and diaphragmatic breathing and practicing self-pacing with activity.;Increase knowledge of respiratory medications and ability to use respiratory devices properly. Improve shortness of breath with ADL's;Develop more efficient breathing techniques such as purse lipped breathing and diaphragmatic breathing and practicing self-pacing with activity.;Increase knowledge of respiratory medications and ability to use respiratory devices properly.      Review Monthly review of patient's Core Components/Risk Factors/Patient Goals are as follows:  Goal in progress for improving her shortness of breath with ADLs. Gladiola is trying to build up her strength and endurance. She has completed 4 sessions so far and is exercising on the NuStep and bike. Her oxygen  saturation has been in a normal range on room air with exertion. Goal progressing for developing more efficient breathing techniques such as purse lipped breathing and diaphragmatic breathing; and practicing self-pacing with activity. Avayah needs to be prompted to perform purse lipped breathing while short of breath. We work on this with her while performing the warmup and while exercising. She is working on diaphragmatic breathing at home. Goal met on increasing her knowledge of respiratory medications and the ability to use her devices correctly. Gicela worked with our respiratory therapist on how to properly use her inhalers. Staff have discussed how to use, when to use, side effects and contraindications with Neville. All questions were answered, and she understood without assistance. Ixchel will continue to benefit from PR for nutrition, education, exercise, and lifestyle  modification. Lesle withdrew from the program on 03/09/24 completing 4 sessions. Discharge review of patient's Core Components/Risk Factors/Patient Goals are as follows: Goal not met for improving her shortness of breath with ADLs. Due to inconsistency in attendance Irianna did not achieve her goal. Her oxygen  saturation was in a normal range on room air with exertion. Goal not met for developing more efficient breathing techniques such as purse lipped breathing and diaphragmatic breathing; and practicing self-pacing with activity. Leonna needed to be prompted to perform purse lipped breathing while short of breath. Again, due to inconsistency with attendance goal was not achieved.      Expected Outcomes Pt will show progress toward meeting expected goals and outcomes. Pt will show progress toward meeting expected goals and outcomes post Pulm Rehab         Exercise  Goals and Review:  Exercise Goals     Row Name 01/29/24 1307             Exercise Goals   Increase Physical Activity Yes       Intervention Provide advice, education, support and counseling about physical activity/exercise needs.;Develop an individualized exercise prescription for aerobic and resistive training based on initial evaluation findings, risk stratification, comorbidities and participant's personal goals.       Expected Outcomes Short Term: Attend rehab on a regular basis to increase amount of physical activity.;Long Term: Exercising regularly at least 3-5 days a week.;Long Term: Add in home exercise to make exercise part of routine and to increase amount of physical activity.       Increase Strength and Stamina Yes       Intervention Provide advice, education, support and counseling about physical activity/exercise needs.;Develop an individualized exercise prescription for aerobic and resistive training based on initial evaluation findings, risk stratification, comorbidities and participant's personal goals.        Expected Outcomes Short Term: Increase workloads from initial exercise prescription for resistance, speed, and METs.;Short Term: Perform resistance training exercises routinely during rehab and add in resistance training at home;Long Term: Improve cardiorespiratory fitness, muscular endurance and strength as measured by increased METs and functional capacity ( )       Able to understand and use rate of perceived exertion (RPE) scale Yes       Intervention Provide education and explanation on how to use RPE scale       Expected Outcomes Short Term: Able to use RPE daily in rehab to express subjective intensity level;Long Term:  Able to use RPE to guide intensity level when exercising independently       Able to understand and use Dyspnea scale Yes       Intervention Provide education and explanation on how to use Dyspnea scale       Expected Outcomes Short Term: Able to use Dyspnea scale daily in rehab to express subjective sense of shortness of breath during exertion;Long Term: Able to use Dyspnea scale to guide intensity level when exercising independently       Knowledge and understanding of Target Heart Rate Range (THRR) Yes       Intervention Provide education and explanation of THRR including how the numbers were predicted and where they are located for reference       Expected Outcomes Short Term: Able to state/look up THRR;Long Term: Able to use THRR to govern intensity when exercising independently;Short Term: Able to use daily as guideline for intensity in rehab       Understanding of Exercise Prescription Yes       Intervention Provide education, explanation, and written materials on patient's individual exercise prescription       Expected Outcomes Short Term: Able to explain program exercise prescription;Long Term: Able to explain home exercise prescription to exercise independently          Exercise Goals Re-Evaluation:  Exercise Goals Re-Evaluation     Row Name 02/18/24 0852  03/10/24 1539           Exercise Goal Re-Evaluation   Exercise Goals Review Increase Physical Activity;Able to understand and use Dyspnea scale;Understanding of Exercise Prescription;Increase Strength and Stamina;Knowledge and understanding of Target Heart Rate Range (THRR);Able to understand and use rate of perceived exertion (RPE) scale Increase Physical Activity;Able to understand and use Dyspnea scale;Understanding of Exercise Prescription;Increase Strength and Stamina;Knowledge and understanding of Target Heart Rate Range (  THRR);Able to understand and use rate of perceived exertion (RPE) scale      Comments Leandrea has completed 4 exercise sessions. She is exercising on the recumbent stepper for 15 min, level 2 now, METs 2. She then is exercising on the recumbent bike for 15 min, level 1, METs 2.0. She is limited by ankle pain and sometimes does not stand or do warm up and cool down exercises. She is using red bands, 3.7 lbs, when she does resistance. Will progress as tolerated however she will miss the next 2 weeks. Neville completed 4 exercise sessions. She has significant ankle trauma and her orthopedist suggested her holding on PR. Pt discharged.      Expected Outcomes Through exercise at rehab and home, the patient will decrease shortness of breath with daily activities and feel confident in carrying out an exercise regimen at home. Through exercise at rehab and home, the patient will decrease shortness of breath with daily activities and feel confident in carrying out an exercise regimen at home.         Nutrition & Weight - Outcomes:  Pre Biometrics - 01/29/24 1417       Pre Biometrics   Grip Strength 18 kg           Nutrition:  Nutrition Therapy & Goals - 02/04/24 1354       Nutrition Therapy   Diet Regular diet    Drug/Food Interactions Statins/Certain Fruits      Personal Nutrition Goals   Nutrition Goal Patient to improve diet quality by using plate method as guide  for meal planning to include lean protein/plant protein, fruits, vegetables, whole grains, nonfat dairy as part of a well-balanced diet.    Comments Pt with history of severe COPD, GERD, CKD. Endorses desire to make healthier food choices. Also notes variable appetite and some early satiety. Underweight based on BMI for >=65 years.      Intervention Plan   Intervention Prescribe, educate and counsel regarding individualized specific dietary modifications aiming towards targeted core components such as weight, hypertension, lipid management, diabetes, heart failure and other comorbidities.;Nutrition handout(s) given to patient.   Nutrition Tips for Better Breathing   Expected Outcomes Short Term Goal: Understand basic principles of dietary content, such as calories, fat, sodium, cholesterol and nutrients.;Long Term Goal: Adherence to prescribed nutrition plan.          Nutrition Discharge:   Education Questionnaire Score:  Knowledge Questionnaire Score - 01/29/24 1310       Knowledge Questionnaire Score   Pre Score 15/18          Goals reviewed with patient; copy given to patient.    [1]  Social History Tobacco Use  Smoking Status Former   Current packs/day: 0.00   Average packs/day: 0.8 packs/day for 38.0 years (28.5 ttl pk-yrs)   Types: Cigarettes   Start date: 05/01/1980   Quit date: 05/01/2018   Years since quitting: 5.8  Smokeless Tobacco Never  Tobacco Comments   Using Nicotrol  Inhaler from primary care

## 2024-03-15 ENCOUNTER — Encounter (HOSPITAL_COMMUNITY)

## 2024-03-17 ENCOUNTER — Encounter (HOSPITAL_COMMUNITY)

## 2024-03-22 ENCOUNTER — Encounter (HOSPITAL_COMMUNITY)

## 2024-03-29 ENCOUNTER — Encounter (HOSPITAL_COMMUNITY)

## 2024-04-05 ENCOUNTER — Encounter (HOSPITAL_COMMUNITY)

## 2024-04-07 ENCOUNTER — Encounter (HOSPITAL_COMMUNITY)

## 2024-04-12 ENCOUNTER — Telehealth: Payer: Self-pay | Admitting: Internal Medicine

## 2024-04-12 ENCOUNTER — Encounter (HOSPITAL_COMMUNITY)

## 2024-04-12 NOTE — Telephone Encounter (Signed)
 Please see the message below. Pt is requesting [name] Referral to an ENT.  Please advise if this patient would need an OV 1st or if the Referral can be placed for this pt.  PT LOV 12/29/2023 and no Referral was placed for an ENT   Copied from CRM #[id number]. Topic: Referral - Request for Referral >> Apr 11, 2024  2:17 PM Linda [name] wrote: Did the patient discuss referral with their provider in the last year? Yes Patient states she has spoken with her PCP regarding the reoccurring sinus infections and having to go to the Urgent Care.  Appointment offered? Yes. Patient is wanting the referral since her PCP already knows she has been having sinus issues.   Type of order/referral and detailed reason for visit: ENT Reoccurring Sinus Infections.   Preference of office, provider, location:  Dr. Hadassah Parody [facility] [address] [place], [place] [address] [phone]   If referral order, have you been seen by this specialty before? No (If Yes, this issue or another issue? When? Where?  Can we respond through MyChart? No

## 2024-04-14 ENCOUNTER — Encounter (HOSPITAL_COMMUNITY)

## 2024-04-14 ENCOUNTER — Telehealth: Payer: Self-pay

## 2024-04-14 NOTE — Telephone Encounter (Signed)
 I called and spoke to the patient in regards to scheduling a appointment for a Prolia Injection with Dr.Bender. Patient stated she has a dexa scan scheduled for 1/20 and she would like to see the result before receiving the injection. I will follow up with the patient in regards to scheduling a appointment.  (DO NOT SCHEDULE)

## 2024-04-18 ENCOUNTER — Other Ambulatory Visit: Payer: Self-pay | Admitting: Internal Medicine

## 2024-04-18 DIAGNOSIS — R0989 Other specified symptoms and signs involving the circulatory and respiratory systems: Secondary | ICD-10-CM

## 2024-04-19 ENCOUNTER — Ambulatory Visit (HOSPITAL_BASED_OUTPATIENT_CLINIC_OR_DEPARTMENT_OTHER)
Admission: RE | Admit: 2024-04-19 | Discharge: 2024-04-19 | Disposition: A | Discharge status: Home / Self Care | Source: Ambulatory Visit | Attending: Internal Medicine | Admitting: Internal Medicine

## 2024-04-19 DIAGNOSIS — E2839 Other primary ovarian failure: Secondary | ICD-10-CM | POA: Insufficient documentation

## 2024-04-21 ENCOUNTER — Encounter (HOSPITAL_COMMUNITY)

## 2024-04-26 ENCOUNTER — Encounter (HOSPITAL_COMMUNITY)

## 2024-04-28 ENCOUNTER — Encounter (HOSPITAL_COMMUNITY)

## 2024-05-03 ENCOUNTER — Encounter (HOSPITAL_COMMUNITY)

## 2024-05-04 NOTE — Telephone Encounter (Signed)
 I called the patient to see if she would like to receive a Prolia Injection. I was unable to reach the patient.

## 2024-05-05 ENCOUNTER — Encounter (HOSPITAL_COMMUNITY)

## 2024-05-20 ENCOUNTER — Institutional Professional Consult (permissible substitution) (INDEPENDENT_AMBULATORY_CARE_PROVIDER_SITE_OTHER)

## 2024-12-14 ENCOUNTER — Ambulatory Visit
